# Patient Record
Sex: Female | Born: 1935 | Race: White | Hispanic: No | Smoking: Never smoker
Health system: Southern US, Community
[De-identification: ages and names within clinical notes are randomized; demographics above are authoritative.]

## PROBLEM LIST (undated history)

## (undated) DIAGNOSIS — F0390 Unspecified dementia without behavioral disturbance: Secondary | ICD-10-CM

## (undated) DIAGNOSIS — I2510 Atherosclerotic heart disease of native coronary artery without angina pectoris: Secondary | ICD-10-CM

## (undated) DIAGNOSIS — F0280 Dementia in other diseases classified elsewhere without behavioral disturbance: Secondary | ICD-10-CM

## (undated) DIAGNOSIS — G309 Alzheimer's disease, unspecified: Secondary | ICD-10-CM

## (undated) DIAGNOSIS — K922 Gastrointestinal hemorrhage, unspecified: Secondary | ICD-10-CM

## (undated) DIAGNOSIS — R911 Solitary pulmonary nodule: Secondary | ICD-10-CM

## (undated) DIAGNOSIS — I639 Cerebral infarction, unspecified: Secondary | ICD-10-CM

## (undated) DIAGNOSIS — I10 Essential (primary) hypertension: Secondary | ICD-10-CM

## (undated) DIAGNOSIS — K449 Diaphragmatic hernia without obstruction or gangrene: Secondary | ICD-10-CM

## (undated) DIAGNOSIS — R0602 Shortness of breath: Secondary | ICD-10-CM

## (undated) DIAGNOSIS — M1990 Unspecified osteoarthritis, unspecified site: Secondary | ICD-10-CM

## (undated) DIAGNOSIS — Z87442 Personal history of urinary calculi: Secondary | ICD-10-CM

## (undated) DIAGNOSIS — M47812 Spondylosis without myelopathy or radiculopathy, cervical region: Secondary | ICD-10-CM

## (undated) DIAGNOSIS — D72829 Elevated white blood cell count, unspecified: Secondary | ICD-10-CM

## (undated) DIAGNOSIS — D72819 Decreased white blood cell count, unspecified: Secondary | ICD-10-CM

## (undated) DIAGNOSIS — K219 Gastro-esophageal reflux disease without esophagitis: Secondary | ICD-10-CM

## (undated) DIAGNOSIS — H34231 Retinal artery branch occlusion, right eye: Secondary | ICD-10-CM

## (undated) DIAGNOSIS — K5790 Diverticulosis of intestine, part unspecified, without perforation or abscess without bleeding: Secondary | ICD-10-CM

## (undated) DIAGNOSIS — E785 Hyperlipidemia, unspecified: Secondary | ICD-10-CM

## (undated) HISTORY — DX: Spondylosis without myelopathy or radiculopathy, cervical region: M47.812

## (undated) HISTORY — PX: TOTAL ABDOMINAL HYSTERECTOMY: SHX209

## (undated) HISTORY — DX: Solitary pulmonary nodule: R91.1

## (undated) HISTORY — PX: JOINT REPLACEMENT: SHX530

## (undated) HISTORY — DX: Diaphragmatic hernia without obstruction or gangrene: K44.9

## (undated) HISTORY — DX: Retinal artery branch occlusion, right eye: H34.231

## (undated) HISTORY — DX: Atherosclerotic heart disease of native coronary artery without angina pectoris: I25.10

## (undated) HISTORY — DX: Decreased white blood cell count, unspecified: D72.819

## (undated) HISTORY — PX: CERVICAL DISCECTOMY: SHX98

## (undated) HISTORY — DX: Gastro-esophageal reflux disease without esophagitis: K21.9

## (undated) HISTORY — PX: CHOLECYSTECTOMY: SHX55

## (undated) HISTORY — DX: Hyperlipidemia, unspecified: E78.5

## (undated) HISTORY — PX: VASCULAR SURGERY: SHX849

## (undated) HISTORY — DX: Unspecified dementia, unspecified severity, without behavioral disturbance, psychotic disturbance, mood disturbance, and anxiety: F03.90

## (undated) HISTORY — DX: Essential (primary) hypertension: I10

## (undated) HISTORY — DX: Gastrointestinal hemorrhage, unspecified: K92.2

## (undated) HISTORY — DX: Unspecified osteoarthritis, unspecified site: M19.90

---

## 1999-04-06 ENCOUNTER — Encounter: Payer: Self-pay | Admitting: Neurosurgery

## 1999-04-08 ENCOUNTER — Inpatient Hospital Stay (HOSPITAL_COMMUNITY): Admission: RE | Admit: 1999-04-08 | Discharge: 1999-04-09 | Payer: Self-pay | Admitting: Neurosurgery

## 1999-10-29 ENCOUNTER — Encounter: Payer: Self-pay | Admitting: Neurosurgery

## 1999-10-29 ENCOUNTER — Encounter: Admission: RE | Admit: 1999-10-29 | Discharge: 1999-10-29 | Payer: Self-pay | Admitting: Neurosurgery

## 1999-12-01 ENCOUNTER — Ambulatory Visit (HOSPITAL_COMMUNITY): Admission: RE | Admit: 1999-12-01 | Discharge: 1999-12-01 | Payer: Self-pay | Admitting: Neurosurgery

## 1999-12-01 ENCOUNTER — Encounter: Payer: Self-pay | Admitting: Neurosurgery

## 2000-02-18 ENCOUNTER — Encounter: Admission: RE | Admit: 2000-02-18 | Discharge: 2000-02-18 | Payer: Self-pay | Admitting: Neurosurgery

## 2000-02-18 ENCOUNTER — Encounter: Payer: Self-pay | Admitting: Neurosurgery

## 2000-07-04 ENCOUNTER — Encounter: Admission: RE | Admit: 2000-07-04 | Discharge: 2000-07-04 | Payer: Self-pay | Admitting: Neurosurgery

## 2000-07-04 ENCOUNTER — Encounter: Payer: Self-pay | Admitting: Neurosurgery

## 2000-07-10 ENCOUNTER — Encounter: Payer: Self-pay | Admitting: Neurosurgery

## 2000-07-12 ENCOUNTER — Ambulatory Visit (HOSPITAL_COMMUNITY): Admission: RE | Admit: 2000-07-12 | Discharge: 2000-07-12 | Payer: Self-pay | Admitting: Neurosurgery

## 2001-01-01 ENCOUNTER — Encounter: Admission: RE | Admit: 2001-01-01 | Discharge: 2001-01-01 | Payer: Self-pay | Admitting: Orthopedic Surgery

## 2001-01-01 ENCOUNTER — Encounter: Payer: Self-pay | Admitting: Orthopedic Surgery

## 2001-01-26 ENCOUNTER — Encounter: Payer: Self-pay | Admitting: Orthopedic Surgery

## 2001-01-26 ENCOUNTER — Encounter: Admission: RE | Admit: 2001-01-26 | Discharge: 2001-01-26 | Payer: Self-pay | Admitting: Orthopedic Surgery

## 2001-01-29 ENCOUNTER — Ambulatory Visit (HOSPITAL_BASED_OUTPATIENT_CLINIC_OR_DEPARTMENT_OTHER): Admission: RE | Admit: 2001-01-29 | Discharge: 2001-01-29 | Payer: Self-pay | Admitting: Orthopedic Surgery

## 2001-05-03 ENCOUNTER — Encounter (HOSPITAL_COMMUNITY): Admission: RE | Admit: 2001-05-03 | Discharge: 2001-06-02 | Payer: Self-pay | Admitting: Orthopedic Surgery

## 2001-06-05 ENCOUNTER — Encounter (HOSPITAL_COMMUNITY): Admission: RE | Admit: 2001-06-05 | Discharge: 2001-07-05 | Payer: Self-pay | Admitting: Orthopedic Surgery

## 2001-07-06 ENCOUNTER — Encounter (HOSPITAL_COMMUNITY): Admission: RE | Admit: 2001-07-06 | Discharge: 2001-08-05 | Payer: Self-pay | Admitting: Orthopedic Surgery

## 2001-07-31 ENCOUNTER — Encounter: Payer: Self-pay | Admitting: Family Medicine

## 2001-07-31 ENCOUNTER — Ambulatory Visit (HOSPITAL_COMMUNITY): Admission: RE | Admit: 2001-07-31 | Discharge: 2001-07-31 | Payer: Self-pay | Admitting: Family Medicine

## 2001-11-21 DIAGNOSIS — I2510 Atherosclerotic heart disease of native coronary artery without angina pectoris: Secondary | ICD-10-CM

## 2001-11-21 HISTORY — DX: Atherosclerotic heart disease of native coronary artery without angina pectoris: I25.10

## 2002-03-25 ENCOUNTER — Encounter: Payer: Self-pay | Admitting: Family Medicine

## 2002-03-25 ENCOUNTER — Ambulatory Visit (HOSPITAL_COMMUNITY): Admission: RE | Admit: 2002-03-25 | Discharge: 2002-03-25 | Payer: Self-pay | Admitting: Family Medicine

## 2002-04-25 ENCOUNTER — Encounter: Payer: Self-pay | Admitting: Family Medicine

## 2002-04-25 ENCOUNTER — Ambulatory Visit (HOSPITAL_COMMUNITY): Admission: RE | Admit: 2002-04-25 | Discharge: 2002-04-25 | Payer: Self-pay | Admitting: Family Medicine

## 2002-07-25 ENCOUNTER — Encounter: Payer: Self-pay | Admitting: Family Medicine

## 2002-07-25 ENCOUNTER — Ambulatory Visit (HOSPITAL_COMMUNITY): Admission: RE | Admit: 2002-07-25 | Discharge: 2002-07-25 | Payer: Self-pay | Admitting: Family Medicine

## 2002-07-30 ENCOUNTER — Ambulatory Visit (HOSPITAL_COMMUNITY): Admission: RE | Admit: 2002-07-30 | Discharge: 2002-07-30 | Payer: Self-pay | Admitting: Cardiology

## 2002-08-02 ENCOUNTER — Ambulatory Visit (HOSPITAL_COMMUNITY): Admission: RE | Admit: 2002-08-02 | Discharge: 2002-08-02 | Payer: Self-pay | Admitting: Cardiology

## 2002-08-04 ENCOUNTER — Emergency Department (HOSPITAL_COMMUNITY): Admission: EM | Admit: 2002-08-04 | Discharge: 2002-08-04 | Payer: Self-pay | Admitting: Emergency Medicine

## 2002-08-07 ENCOUNTER — Ambulatory Visit (HOSPITAL_COMMUNITY): Admission: RE | Admit: 2002-08-07 | Discharge: 2002-08-07 | Payer: Self-pay | Admitting: Cardiology

## 2002-08-08 ENCOUNTER — Ambulatory Visit (HOSPITAL_COMMUNITY): Admission: RE | Admit: 2002-08-08 | Discharge: 2002-08-08 | Payer: Self-pay | Admitting: Cardiology

## 2002-08-22 ENCOUNTER — Ambulatory Visit (HOSPITAL_COMMUNITY): Admission: RE | Admit: 2002-08-22 | Discharge: 2002-08-22 | Payer: Self-pay | Admitting: Family Medicine

## 2002-08-22 ENCOUNTER — Encounter: Payer: Self-pay | Admitting: Family Medicine

## 2002-08-27 ENCOUNTER — Ambulatory Visit (HOSPITAL_COMMUNITY): Admission: RE | Admit: 2002-08-27 | Discharge: 2002-08-27 | Payer: Self-pay | Admitting: Family Medicine

## 2002-08-27 ENCOUNTER — Encounter: Payer: Self-pay | Admitting: Family Medicine

## 2002-09-02 ENCOUNTER — Inpatient Hospital Stay (HOSPITAL_COMMUNITY): Admission: AD | Admit: 2002-09-02 | Discharge: 2002-09-05 | Payer: Self-pay | Admitting: Family Medicine

## 2002-09-02 ENCOUNTER — Encounter: Payer: Self-pay | Admitting: Family Medicine

## 2002-10-21 ENCOUNTER — Ambulatory Visit (HOSPITAL_COMMUNITY): Admission: RE | Admit: 2002-10-21 | Discharge: 2002-10-21 | Payer: Self-pay | Admitting: Internal Medicine

## 2002-10-21 ENCOUNTER — Encounter (INDEPENDENT_AMBULATORY_CARE_PROVIDER_SITE_OTHER): Payer: Self-pay | Admitting: *Deleted

## 2002-12-29 ENCOUNTER — Emergency Department (HOSPITAL_COMMUNITY): Admission: EM | Admit: 2002-12-29 | Discharge: 2002-12-29 | Payer: Self-pay

## 2003-02-26 ENCOUNTER — Encounter: Payer: Self-pay | Admitting: Urology

## 2003-02-26 ENCOUNTER — Ambulatory Visit (HOSPITAL_COMMUNITY): Admission: RE | Admit: 2003-02-26 | Discharge: 2003-02-26 | Payer: Self-pay | Admitting: Urology

## 2003-02-26 ENCOUNTER — Encounter (INDEPENDENT_AMBULATORY_CARE_PROVIDER_SITE_OTHER): Payer: Self-pay | Admitting: Specialist

## 2003-03-27 ENCOUNTER — Encounter: Payer: Self-pay | Admitting: Urology

## 2003-03-27 ENCOUNTER — Ambulatory Visit (HOSPITAL_BASED_OUTPATIENT_CLINIC_OR_DEPARTMENT_OTHER): Admission: RE | Admit: 2003-03-27 | Discharge: 2003-03-27 | Payer: Self-pay | Admitting: Urology

## 2003-08-04 ENCOUNTER — Encounter (HOSPITAL_COMMUNITY): Admission: RE | Admit: 2003-08-04 | Discharge: 2003-08-21 | Payer: Self-pay | Admitting: Oncology

## 2003-08-04 ENCOUNTER — Encounter: Admission: RE | Admit: 2003-08-04 | Discharge: 2003-08-04 | Payer: Self-pay | Admitting: Oncology

## 2003-11-12 ENCOUNTER — Encounter (HOSPITAL_COMMUNITY): Admission: RE | Admit: 2003-11-12 | Discharge: 2003-12-12 | Payer: Self-pay | Admitting: Oncology

## 2003-11-12 ENCOUNTER — Encounter: Admission: RE | Admit: 2003-11-12 | Discharge: 2003-11-12 | Payer: Self-pay | Admitting: Oncology

## 2004-02-04 ENCOUNTER — Encounter: Admission: RE | Admit: 2004-02-04 | Discharge: 2004-02-04 | Payer: Self-pay | Admitting: Oncology

## 2004-02-04 ENCOUNTER — Encounter (HOSPITAL_COMMUNITY): Admission: RE | Admit: 2004-02-04 | Discharge: 2004-03-05 | Payer: Self-pay | Admitting: Oncology

## 2004-03-11 ENCOUNTER — Encounter: Admission: RE | Admit: 2004-03-11 | Discharge: 2004-03-11 | Payer: Self-pay | Admitting: Oncology

## 2004-03-11 ENCOUNTER — Encounter (HOSPITAL_COMMUNITY): Admission: RE | Admit: 2004-03-11 | Discharge: 2004-04-10 | Payer: Self-pay | Admitting: Oncology

## 2004-03-19 ENCOUNTER — Ambulatory Visit (HOSPITAL_COMMUNITY): Admission: RE | Admit: 2004-03-19 | Discharge: 2004-03-19 | Payer: Self-pay | Admitting: Neurology

## 2004-04-09 ENCOUNTER — Ambulatory Visit (HOSPITAL_COMMUNITY): Admission: RE | Admit: 2004-04-09 | Discharge: 2004-04-09 | Payer: Self-pay | Admitting: Family Medicine

## 2004-04-27 ENCOUNTER — Encounter (HOSPITAL_COMMUNITY): Admission: RE | Admit: 2004-04-27 | Discharge: 2004-05-27 | Payer: Self-pay | Admitting: Oncology

## 2004-04-27 ENCOUNTER — Encounter: Admission: RE | Admit: 2004-04-27 | Discharge: 2004-04-27 | Payer: Self-pay | Admitting: Oncology

## 2004-06-07 ENCOUNTER — Encounter (HOSPITAL_COMMUNITY): Admission: RE | Admit: 2004-06-07 | Discharge: 2004-07-07 | Payer: Self-pay | Admitting: Oncology

## 2004-06-07 ENCOUNTER — Encounter: Admission: RE | Admit: 2004-06-07 | Discharge: 2004-06-07 | Payer: Self-pay | Admitting: Oncology

## 2004-07-19 ENCOUNTER — Encounter: Admission: RE | Admit: 2004-07-19 | Discharge: 2004-07-19 | Payer: Self-pay | Admitting: Oncology

## 2004-07-19 ENCOUNTER — Encounter (HOSPITAL_COMMUNITY): Admission: RE | Admit: 2004-07-19 | Discharge: 2004-08-18 | Payer: Self-pay | Admitting: Oncology

## 2004-09-13 ENCOUNTER — Encounter: Admission: RE | Admit: 2004-09-13 | Discharge: 2004-09-13 | Payer: Self-pay | Admitting: Oncology

## 2004-09-13 ENCOUNTER — Encounter (HOSPITAL_COMMUNITY): Admission: RE | Admit: 2004-09-13 | Discharge: 2004-10-13 | Payer: Self-pay | Admitting: Oncology

## 2004-10-11 ENCOUNTER — Ambulatory Visit (HOSPITAL_COMMUNITY): Payer: Self-pay | Admitting: Oncology

## 2004-11-08 ENCOUNTER — Encounter (HOSPITAL_COMMUNITY): Admission: RE | Admit: 2004-11-08 | Discharge: 2004-12-08 | Payer: Self-pay | Admitting: Oncology

## 2004-11-08 ENCOUNTER — Encounter: Admission: RE | Admit: 2004-11-08 | Discharge: 2004-11-08 | Payer: Self-pay | Admitting: Oncology

## 2004-11-21 HISTORY — PX: TOTAL KNEE ARTHROPLASTY: SHX125

## 2005-01-05 ENCOUNTER — Encounter (HOSPITAL_COMMUNITY): Admission: RE | Admit: 2005-01-05 | Discharge: 2005-02-04 | Payer: Self-pay | Admitting: Oncology

## 2005-01-05 ENCOUNTER — Encounter: Admission: RE | Admit: 2005-01-05 | Discharge: 2005-01-05 | Payer: Self-pay | Admitting: Oncology

## 2005-01-05 ENCOUNTER — Ambulatory Visit (HOSPITAL_COMMUNITY): Payer: Self-pay | Admitting: Oncology

## 2005-01-31 ENCOUNTER — Encounter (HOSPITAL_COMMUNITY): Admission: RE | Admit: 2005-01-31 | Discharge: 2005-03-02 | Payer: Self-pay | Admitting: Family Medicine

## 2005-02-14 ENCOUNTER — Ambulatory Visit: Payer: Self-pay | Admitting: Orthopedic Surgery

## 2005-02-14 ENCOUNTER — Inpatient Hospital Stay (HOSPITAL_COMMUNITY): Admission: RE | Admit: 2005-02-14 | Discharge: 2005-02-24 | Payer: Self-pay | Admitting: Orthopedic Surgery

## 2005-02-14 ENCOUNTER — Ambulatory Visit: Payer: Self-pay | Admitting: Physical Medicine & Rehabilitation

## 2005-02-18 ENCOUNTER — Ambulatory Visit: Payer: Self-pay | Admitting: Cardiology

## 2005-02-18 ENCOUNTER — Encounter: Payer: Self-pay | Admitting: Cardiology

## 2005-02-19 ENCOUNTER — Encounter (INDEPENDENT_AMBULATORY_CARE_PROVIDER_SITE_OTHER): Payer: Self-pay | Admitting: Specialist

## 2005-02-24 ENCOUNTER — Inpatient Hospital Stay
Admission: RE | Admit: 2005-02-24 | Discharge: 2005-03-02 | Payer: Self-pay | Admitting: Physical Medicine & Rehabilitation

## 2005-03-16 ENCOUNTER — Encounter (HOSPITAL_COMMUNITY): Admission: RE | Admit: 2005-03-16 | Discharge: 2005-04-15 | Payer: Self-pay | Admitting: Oncology

## 2005-03-16 ENCOUNTER — Encounter: Admission: RE | Admit: 2005-03-16 | Discharge: 2005-03-16 | Payer: Self-pay | Admitting: Oncology

## 2005-03-16 ENCOUNTER — Ambulatory Visit (HOSPITAL_COMMUNITY): Payer: Self-pay | Admitting: Oncology

## 2005-03-16 ENCOUNTER — Encounter (HOSPITAL_COMMUNITY): Admission: RE | Admit: 2005-03-16 | Discharge: 2005-04-15 | Payer: Self-pay | Admitting: Orthopedic Surgery

## 2005-03-18 ENCOUNTER — Ambulatory Visit: Payer: Self-pay | Admitting: Cardiology

## 2005-03-29 ENCOUNTER — Ambulatory Visit (HOSPITAL_COMMUNITY): Admission: RE | Admit: 2005-03-29 | Discharge: 2005-03-29 | Payer: Self-pay | Admitting: Family Medicine

## 2005-04-05 ENCOUNTER — Ambulatory Visit: Payer: Self-pay | Admitting: Internal Medicine

## 2005-04-11 ENCOUNTER — Ambulatory Visit: Payer: Self-pay | Admitting: Internal Medicine

## 2005-04-20 ENCOUNTER — Encounter (HOSPITAL_COMMUNITY): Admission: RE | Admit: 2005-04-20 | Discharge: 2005-05-20 | Payer: Self-pay | Admitting: Orthopedic Surgery

## 2005-05-13 ENCOUNTER — Ambulatory Visit (HOSPITAL_COMMUNITY): Payer: Self-pay | Admitting: Oncology

## 2005-05-13 ENCOUNTER — Encounter (HOSPITAL_COMMUNITY): Admission: RE | Admit: 2005-05-13 | Discharge: 2005-06-12 | Payer: Self-pay | Admitting: Oncology

## 2005-05-13 ENCOUNTER — Encounter: Admission: RE | Admit: 2005-05-13 | Discharge: 2005-05-13 | Payer: Self-pay | Admitting: Oncology

## 2005-08-01 ENCOUNTER — Ambulatory Visit (HOSPITAL_COMMUNITY): Admission: RE | Admit: 2005-08-01 | Discharge: 2005-08-01 | Payer: Self-pay | Admitting: Family Medicine

## 2005-09-07 ENCOUNTER — Encounter (HOSPITAL_COMMUNITY): Admission: RE | Admit: 2005-09-07 | Discharge: 2005-10-07 | Payer: Self-pay | Admitting: Oncology

## 2005-09-07 ENCOUNTER — Ambulatory Visit (HOSPITAL_COMMUNITY): Payer: Self-pay | Admitting: Oncology

## 2005-09-07 ENCOUNTER — Encounter: Admission: RE | Admit: 2005-09-07 | Discharge: 2005-09-07 | Payer: Self-pay | Admitting: Oncology

## 2006-03-08 ENCOUNTER — Ambulatory Visit (HOSPITAL_COMMUNITY): Payer: Self-pay | Admitting: Oncology

## 2006-03-08 ENCOUNTER — Encounter (HOSPITAL_COMMUNITY): Admission: RE | Admit: 2006-03-08 | Discharge: 2006-04-07 | Payer: Self-pay | Admitting: Oncology

## 2006-03-08 ENCOUNTER — Encounter: Admission: RE | Admit: 2006-03-08 | Discharge: 2006-03-08 | Payer: Self-pay | Admitting: Oncology

## 2006-03-16 ENCOUNTER — Ambulatory Visit: Payer: Self-pay | Admitting: Cardiology

## 2006-03-22 ENCOUNTER — Encounter: Payer: Self-pay | Admitting: Neurosurgery

## 2006-04-05 ENCOUNTER — Ambulatory Visit: Payer: Self-pay | Admitting: Internal Medicine

## 2006-09-11 ENCOUNTER — Encounter (HOSPITAL_COMMUNITY): Admission: RE | Admit: 2006-09-11 | Discharge: 2006-10-11 | Payer: Self-pay | Admitting: Oncology

## 2006-09-11 ENCOUNTER — Ambulatory Visit (HOSPITAL_COMMUNITY): Payer: Self-pay | Admitting: Oncology

## 2006-09-11 ENCOUNTER — Encounter: Admission: RE | Admit: 2006-09-11 | Discharge: 2006-09-11 | Payer: Self-pay | Admitting: Oncology

## 2007-03-06 ENCOUNTER — Encounter (HOSPITAL_COMMUNITY): Admission: RE | Admit: 2007-03-06 | Discharge: 2007-04-05 | Payer: Self-pay | Admitting: Oncology

## 2007-03-06 ENCOUNTER — Ambulatory Visit (HOSPITAL_COMMUNITY): Payer: Self-pay | Admitting: Oncology

## 2007-03-14 ENCOUNTER — Ambulatory Visit: Payer: Self-pay | Admitting: Cardiology

## 2007-04-07 ENCOUNTER — Emergency Department (HOSPITAL_COMMUNITY): Admission: EM | Admit: 2007-04-07 | Discharge: 2007-04-07 | Payer: Self-pay | Admitting: Emergency Medicine

## 2007-04-09 ENCOUNTER — Observation Stay (HOSPITAL_COMMUNITY): Admission: AD | Admit: 2007-04-09 | Discharge: 2007-04-10 | Payer: Self-pay | Admitting: Family Medicine

## 2007-06-26 ENCOUNTER — Emergency Department (HOSPITAL_COMMUNITY): Admission: EM | Admit: 2007-06-26 | Discharge: 2007-06-26 | Payer: Self-pay | Admitting: *Deleted

## 2007-09-17 ENCOUNTER — Ambulatory Visit: Payer: Self-pay | Admitting: Internal Medicine

## 2007-10-08 ENCOUNTER — Ambulatory Visit (HOSPITAL_COMMUNITY): Admission: RE | Admit: 2007-10-08 | Discharge: 2007-10-08 | Payer: Self-pay | Admitting: Family Medicine

## 2007-11-22 HISTORY — PX: COLONOSCOPY: SHX174

## 2008-01-16 DIAGNOSIS — G20 Parkinson's disease: Secondary | ICD-10-CM

## 2008-01-16 DIAGNOSIS — N200 Calculus of kidney: Secondary | ICD-10-CM | POA: Insufficient documentation

## 2008-01-16 DIAGNOSIS — E785 Hyperlipidemia, unspecified: Secondary | ICD-10-CM | POA: Insufficient documentation

## 2008-01-16 DIAGNOSIS — I10 Essential (primary) hypertension: Secondary | ICD-10-CM | POA: Insufficient documentation

## 2008-09-17 ENCOUNTER — Telehealth: Payer: Self-pay | Admitting: Internal Medicine

## 2008-09-22 ENCOUNTER — Encounter: Payer: Self-pay | Admitting: Internal Medicine

## 2008-09-23 ENCOUNTER — Telehealth: Payer: Self-pay | Admitting: Internal Medicine

## 2008-10-01 ENCOUNTER — Ambulatory Visit: Payer: Self-pay | Admitting: Internal Medicine

## 2008-10-02 ENCOUNTER — Encounter: Payer: Self-pay | Admitting: Internal Medicine

## 2008-10-02 ENCOUNTER — Ambulatory Visit: Payer: Self-pay | Admitting: Internal Medicine

## 2008-10-03 ENCOUNTER — Telehealth: Payer: Self-pay | Admitting: Internal Medicine

## 2008-10-06 ENCOUNTER — Encounter: Payer: Self-pay | Admitting: Internal Medicine

## 2008-10-07 ENCOUNTER — Telehealth: Payer: Self-pay | Admitting: Internal Medicine

## 2008-10-08 ENCOUNTER — Telehealth: Payer: Self-pay | Admitting: Internal Medicine

## 2008-10-14 ENCOUNTER — Telehealth: Payer: Self-pay | Admitting: Internal Medicine

## 2008-10-27 ENCOUNTER — Ambulatory Visit (HOSPITAL_COMMUNITY): Admission: RE | Admit: 2008-10-27 | Discharge: 2008-10-27 | Payer: Self-pay | Admitting: Family Medicine

## 2008-11-06 ENCOUNTER — Telehealth: Payer: Self-pay | Admitting: Internal Medicine

## 2008-11-20 ENCOUNTER — Encounter (HOSPITAL_COMMUNITY): Admission: RE | Admit: 2008-11-20 | Discharge: 2008-12-20 | Payer: Self-pay | Admitting: Family Medicine

## 2008-11-20 ENCOUNTER — Ambulatory Visit (HOSPITAL_COMMUNITY): Payer: Self-pay | Admitting: Family Medicine

## 2008-12-18 ENCOUNTER — Telehealth: Payer: Self-pay | Admitting: Internal Medicine

## 2009-02-12 ENCOUNTER — Ambulatory Visit (HOSPITAL_COMMUNITY): Payer: Self-pay | Admitting: Family Medicine

## 2009-02-12 ENCOUNTER — Encounter (HOSPITAL_COMMUNITY): Admission: RE | Admit: 2009-02-12 | Discharge: 2009-03-14 | Payer: Self-pay | Admitting: Family Medicine

## 2009-03-10 ENCOUNTER — Ambulatory Visit: Payer: Self-pay | Admitting: Cardiology

## 2009-05-13 ENCOUNTER — Encounter (HOSPITAL_COMMUNITY): Admission: RE | Admit: 2009-05-13 | Discharge: 2009-06-12 | Payer: Self-pay | Admitting: Family Medicine

## 2009-05-13 ENCOUNTER — Ambulatory Visit (HOSPITAL_COMMUNITY): Payer: Self-pay | Admitting: Family Medicine

## 2009-06-08 ENCOUNTER — Encounter (HOSPITAL_COMMUNITY): Admission: RE | Admit: 2009-06-08 | Discharge: 2009-07-08 | Payer: Self-pay | Admitting: Family Medicine

## 2009-08-12 ENCOUNTER — Ambulatory Visit (HOSPITAL_COMMUNITY): Payer: Self-pay | Admitting: Family Medicine

## 2009-08-12 ENCOUNTER — Encounter (HOSPITAL_COMMUNITY): Admission: RE | Admit: 2009-08-12 | Discharge: 2009-08-19 | Payer: Self-pay | Admitting: Family Medicine

## 2009-09-08 ENCOUNTER — Ambulatory Visit (HOSPITAL_COMMUNITY): Admission: RE | Admit: 2009-09-08 | Discharge: 2009-09-08 | Payer: Self-pay | Admitting: Family Medicine

## 2009-11-11 ENCOUNTER — Encounter (HOSPITAL_COMMUNITY): Admission: RE | Admit: 2009-11-11 | Discharge: 2009-11-17 | Payer: Self-pay | Admitting: Family Medicine

## 2009-11-11 ENCOUNTER — Ambulatory Visit (HOSPITAL_COMMUNITY): Payer: Self-pay | Admitting: Family Medicine

## 2009-11-26 ENCOUNTER — Ambulatory Visit (HOSPITAL_COMMUNITY): Admission: RE | Admit: 2009-11-26 | Discharge: 2009-11-26 | Payer: Self-pay | Admitting: Family Medicine

## 2009-12-28 ENCOUNTER — Encounter: Payer: Self-pay | Admitting: Internal Medicine

## 2010-01-15 ENCOUNTER — Encounter: Payer: Self-pay | Admitting: Internal Medicine

## 2010-02-02 ENCOUNTER — Encounter (INDEPENDENT_AMBULATORY_CARE_PROVIDER_SITE_OTHER): Payer: Self-pay | Admitting: *Deleted

## 2010-02-02 ENCOUNTER — Encounter: Payer: Self-pay | Admitting: Internal Medicine

## 2010-02-02 LAB — CONVERTED CEMR LAB
ALT: 20 units/L
AST: 17 units/L
Albumin: 4.5 g/dL
BUN: 20 mg/dL
CO2: 22 meq/L
Calcium: 9.1 mg/dL
Chloride: 109 meq/L
HDL: 45 mg/dL
Hemoglobin: 12.7 g/dL
Hgb A1c MFr Bld: 6.5 %
LDL Cholesterol: 28 mg/dL
MCV: 103.8 fL
Potassium: 4.3 meq/L
WBC: 4.4 10*3/uL

## 2010-02-06 ENCOUNTER — Emergency Department (HOSPITAL_COMMUNITY): Admission: EM | Admit: 2010-02-06 | Discharge: 2010-02-06 | Payer: Self-pay | Admitting: Emergency Medicine

## 2010-02-17 ENCOUNTER — Encounter (HOSPITAL_COMMUNITY): Admission: RE | Admit: 2010-02-17 | Discharge: 2010-03-19 | Payer: Self-pay | Admitting: Family Medicine

## 2010-02-17 ENCOUNTER — Ambulatory Visit (HOSPITAL_COMMUNITY): Payer: Self-pay | Admitting: Family Medicine

## 2010-02-26 ENCOUNTER — Ambulatory Visit (HOSPITAL_COMMUNITY): Admission: RE | Admit: 2010-02-26 | Discharge: 2010-02-26 | Payer: Self-pay | Admitting: Family Medicine

## 2010-03-11 ENCOUNTER — Ambulatory Visit (HOSPITAL_COMMUNITY)
Admission: RE | Admit: 2010-03-11 | Discharge: 2010-03-11 | Payer: Self-pay | Source: Home / Self Care | Admitting: Family Medicine

## 2010-04-20 ENCOUNTER — Encounter (INDEPENDENT_AMBULATORY_CARE_PROVIDER_SITE_OTHER): Payer: Self-pay | Admitting: *Deleted

## 2010-04-27 ENCOUNTER — Ambulatory Visit: Payer: Self-pay | Admitting: Cardiology

## 2010-05-20 ENCOUNTER — Encounter (HOSPITAL_COMMUNITY): Admission: RE | Admit: 2010-05-20 | Discharge: 2010-06-19 | Payer: Self-pay | Admitting: Family Medicine

## 2010-05-20 ENCOUNTER — Ambulatory Visit (HOSPITAL_COMMUNITY): Payer: Self-pay | Admitting: Family Medicine

## 2010-08-19 ENCOUNTER — Encounter (HOSPITAL_COMMUNITY)
Admission: RE | Admit: 2010-08-19 | Discharge: 2010-08-20 | Payer: Self-pay | Source: Home / Self Care | Admitting: Family Medicine

## 2010-08-19 ENCOUNTER — Ambulatory Visit (HOSPITAL_COMMUNITY): Payer: Self-pay | Admitting: Family Medicine

## 2010-11-25 ENCOUNTER — Ambulatory Visit (HOSPITAL_COMMUNITY)
Admission: RE | Admit: 2010-11-25 | Discharge: 2010-12-21 | Payer: Self-pay | Source: Home / Self Care | Attending: Family Medicine | Admitting: Family Medicine

## 2010-11-25 ENCOUNTER — Encounter (HOSPITAL_COMMUNITY)
Admission: RE | Admit: 2010-11-25 | Discharge: 2010-12-21 | Payer: Self-pay | Source: Home / Self Care | Attending: Family Medicine | Admitting: Family Medicine

## 2010-12-12 ENCOUNTER — Encounter: Payer: Self-pay | Admitting: Family Medicine

## 2010-12-20 ENCOUNTER — Other Ambulatory Visit (HOSPITAL_COMMUNITY): Payer: Self-pay | Admitting: Family Medicine

## 2010-12-20 DIAGNOSIS — Z139 Encounter for screening, unspecified: Secondary | ICD-10-CM

## 2010-12-23 ENCOUNTER — Ambulatory Visit (HOSPITAL_COMMUNITY)
Admission: RE | Admit: 2010-12-23 | Discharge: 2010-12-23 | Disposition: A | Discharge status: Home / Self Care | Payer: Medicare Other | Source: Ambulatory Visit | Attending: Family Medicine | Admitting: Family Medicine

## 2010-12-23 DIAGNOSIS — Z1231 Encounter for screening mammogram for malignant neoplasm of breast: Secondary | ICD-10-CM | POA: Insufficient documentation

## 2010-12-23 DIAGNOSIS — Z139 Encounter for screening, unspecified: Secondary | ICD-10-CM

## 2010-12-23 NOTE — Assessment & Plan Note (Signed)
Summary: 1 yr f/u per checkout on 03/10/09/tg  Medications Added POTASSIUM CHLORIDE 10 MEQ/100ML SOLN (POTASSIUM CHLORIDE) 1 tablespoon at night LISINOPRIL 10 MG TABS (LISINOPRIL) take 1/2 tab daily BONIVA 3 MG/3ML KIT (IBANDRONATE SODIUM) 1 shot q 3 months TYLENOL EXTRA STRENGTH 500 MG TABS (ACETAMINOPHEN) take 2 tabs two times a day COZAAR 100 MG TABS (LOSARTAN POTASSIUM) take 1 tab daily PRAVASTATIN SODIUM 10 MG TABS (PRAVASTATIN SODIUM) take 1 tab daily      Allergies Added:   Visit Type:  Follow-up Primary Provider:  Lilyan Punt, MD   History of Present Illness: [name] returns to the office for continued assessment and treatment of cardiovascular risk factors.  Since her last visit, she has done fairly well.  Control of hypertension and diabetes has been excellent, at least partially as a result of significant weight loss.  Patient notes some depression since the death of her husband, but denies anorexia or sleep disturbance.  She has no chest pain, dyspnea on exertion, orthopnea, or PND.  She has had some mild lightheadedness, but no loss of consciousness and no falls.  Current Medications (verified): 1)  Omeprazole 40 Mg Cpdr (Omeprazole) .Marland Kitchen.. 1 Tablet By Mouth Two Times A Day 2)  Cvs Stool Softener 100 Mg Caps (Docusate Sodium) .Marland Kitchen.. 1 Tablet By Mouth Two Times A Day 3)  Potassium Chloride 10 Meq/136ml Soln (Potassium Chloride) .Marland Kitchen.. 1 Tablespoon At Night 4)  Citalopram Hydrobromide 20 Mg Tabs (Citalopram Hydrobromide) .... 1/2 Tablet By Mouth Once Daily 5)  Namenda 5 Mg Tabs (Memantine Hcl) .Marland Kitchen.. 1 Tablet By Mouth Two Times A Day 6)  Lisinopril 10 Mg Tabs (Lisinopril) .... Take 1/2 Tab Daily 7)  Aspirin 81 Mg  Tabs (Aspirin) .Marland Kitchen.. 1 Tablet By Mouth Once Daily 8)  Vitamin E 600 Unit  Caps (Vitamin E) .Marland Kitchen.. 1 Tablet By Mouth Once Daily 9)  Vitamin B Complex-C   Caps (B Complex-C) .Marland Kitchen.. 1 Tablet By Mouth Once Daily 10)  Caltrate 600+d Plus 600-400 Mg-Unit Tabs (Calcium  Carbonate-Vit D-Min) .Marland Kitchen.. 1 Tablet By Mouth Once Daily 11)  Boniva 3 Mg/30ml Kit (Ibandronate Sodium) .Marland Kitchen.. 1 Shot Q 3 Months 12)  Tylenol Extra Strength 500 Mg Tabs (Acetaminophen) .... Take 2 Tabs Two Times A Day 13)  Cozaar 100 Mg Tabs (Losartan Potassium) .... Take 1 Tab Daily 14)  Pravastatin Sodium 10 Mg Tabs (Pravastatin Sodium) .... Take 1 Tab Daily  Allergies (verified): 1)  ! Codeine 2)  ! Morphine  Past History:  PMH, FH, and Social History reviewed and updated.   Review of Systems       See history of present illness.  Vital Signs:  Patient profile:   77 year old female Weight:      167 pounds BMI:     31.67 Pulse rate:   89 / minute Pulse (ortho):   94 / minute BP sitting:   131 / 68  (right arm) BP standing:   110 / 63  Vitals Entered By: Dreama Saa, CNA (April 27, 2010 2:27 PM)  Serial Vital Signs/Assessments:  Time      Position  BP       Pulse  Resp  Temp     By 3:23 PM   Lying RA  120/66   79                    Tammy Sanders RN 3:23 PM   Sitting   112/61   80  Teressa Lower RN 3:23 PM   Standing  110/63   94                    Tammy Sanders RN  Comments: 3:23 PM pt c/o lightheadedness on standing only By: Teressa Lower RN    Physical Exam  General:  Overweight; well developed; no acute distress: patient's daughter does most of the talking for her Weight-167, 22 pounds less then 18 months ago Blood Pressure-120/70 with 10 mm orthostatic change and no symptoms with standing Neck-No JVD; no carotid bruits: Lungs-No tachypnea, no rales; no rhonchi; no wheezes: Cardiovascular-normal PMI; distant S1 and S2; grade 2/6 basilar systolic ejection murmur Abdomen-BS normal; soft and non-tender without masses or organomegaly:  Musculoskeletal-No deformities, no cyanosis or clubbing: Neurologic-Normal cranial nerves; symmetric strength and tone:  Skin-Warm, no significant lesions: Extremities-Nl distal pulses; no  edema:     Impression & Recommendations:  Problem # 1:  ATHEROSCLEROTIC CARDIOVASCULAR DISEASE (ICD-429.2) Patient had borderline single-vessel disease at catheterization 8 years ago.  She has no current symptoms to suggest progression of disease to cause myocardial ischemia.  Our current approach of managing cardiovascular risk factors optimally will continue to be pursued.  Problem # 2:  HYPERLIPIDEMIA (ICD-272.4) Due to very low values for total and LDL cholesterol, patient's statin medication has been progressively reduced in dosage.  Although her lipid profile would probably be excellent off drugs, in light of the presence of known coronary artery disease and a history of diabetes, I would be inclined to continue her dose of pravastatin indefinitely  Problem # 3:  HYPERTENSION (ICD-401.9) Blood pressure control is good with borderline significant orthostatic change and a history of lightheadedness but no syncope.  She is taking both a very low dose of an ACE inhibitor plus an ARB at maximal dose.  Consideration could be given to discontinuing her ACE inhibitor.  Problem # 4:  Weight loss Although her current weight is more desirable than her previous weight, loss of more than 20 pounds without attempted caloric restriction is somewhat worrisome.  This certainly could be due to depression or simply to a change in lifestyle since the death of her husband.  In the absence of symptoms, diagnostic testing to seek a cause for weight loss will probably not be helpful.  Problem # 5:  DIABETES MELLITUS (ICD-250.00) [name] [name] is no longer taking any medication for treatment of diabetes, and has apparently maintained a normal hemoglobin A1c level.  Improvement in this condition probably resulted from her substantial weight loss.  I will plan to reassess this very nice woman in one year.  Patient Instructions: 1)  Your physician recommends that you schedule a follow-up appointment in: 1 year

## 2010-12-23 NOTE — Letter (Signed)
Summary: [facility]  [facility]   Imported By: Maryln Gottron 02/08/2010 15:50:48  _____________________________________________________________________  External Attachment:    Type:   Image     Comment:   External Document

## 2010-12-23 NOTE — Miscellaneous (Signed)
Summary: LABS CBCD,BMP,LIPIDS,LIVER,A1C,02/02/2010  Clinical Lists Changes  Observations: Added new observation of CALCIUM: 9.1 mg/dL (04/54/0981 19:14) Added new observation of ALBUMIN: 4.5 g/dL (78/29/5621 30:86) Added new observation of PROTEIN, TOT: 6.9 g/dL (57/84/6962 95:28) Added new observation of SGPT (ALT): 20 units/L (02/02/2010 13:55) Added new observation of SGOT (AST): 17 units/L (02/02/2010 13:55) Added new observation of ALK PHOS: 31 units/L (02/02/2010 13:55) Added new observation of BILI DIRECT: 0.1 mg/dL (41/32/4401 02:72) Added new observation of CREATININE: 1.05 mg/dL (53/66/4403 47:42) Added new observation of BUN: 20 mg/dL (59/56/3875 64:33) Added new observation of BG RANDOM: 126 mg/dL (29/51/8841 66:06) Added new observation of CO2 PLSM/SER: 22 meq/L (02/02/2010 13:55) Added new observation of CL SERUM: 109 meq/L (02/02/2010 13:55) Added new observation of K SERUM: 4.3 meq/L (02/02/2010 13:55) Added new observation of NA: 143 meq/L (02/02/2010 13:55) Added new observation of LDL: 28 mg/dL (30/16/0109 32:35) Added new observation of HDL: 45 mg/dL (57/32/2025 42:70) Added new observation of TRIGLYC TOT: 66 mg/dL (62/37/6283 15:17) Added new observation of CHOLESTEROL: 86 mg/dL (61/60/7371 06:26) Added new observation of PLATELETK/UL: 208 K/uL (02/02/2010 13:55) Added new observation of MCV: 103.8 fL (02/02/2010 13:55) Added new observation of HCT: 41.1 % (02/02/2010 13:55) Added new observation of HGB: 12.7 g/dL (94/85/4627 03:50) Added new observation of WBC COUNT: 4.4 10*3/microliter (02/02/2010 13:55) Added new observation of HGBA1C: 6.5 % (02/02/2010 13:55)

## 2010-12-23 NOTE — Letter (Signed)
Summary: [facility]  [facility]   Imported By: Maryln Gottron 02/08/2010 15:49:31  _____________________________________________________________________  External Attachment:    Type:   Image     Comment:   External Document

## 2011-02-24 ENCOUNTER — Encounter (HOSPITAL_COMMUNITY): Payer: Medicare Other | Attending: Family Medicine

## 2011-02-24 ENCOUNTER — Encounter (HOSPITAL_COMMUNITY): Payer: Medicare Other

## 2011-02-24 DIAGNOSIS — M818 Other osteoporosis without current pathological fracture: Secondary | ICD-10-CM | POA: Insufficient documentation

## 2011-04-05 NOTE — Letter (Signed)
March 10, 2009    Scott A. Gerda Diss, MD  [address], [address]  [place], [place] [address]   RE:  TRIANA, COOVER  MRN:  [id number]  /  DOB:  12-22-1923   Dear Lorin Picket,   Ms. Boschee returns to the office 1 year beyond her scheduled visit for  continued assessment and treatment of cardiovascular risk factors.  Since I last saw her 2 years ago, she has done quite well.  She leads a  sedentary lifestyle, but has no dyspnea nor chest discomfort.  She is  able to do light housework without difficulty.  She has developed no new  medical problems nor has she been hospitalized nor seen in the emergency  department.  Her arthritic discomfort is actually improved over the past  year or so.  Blood pressure control has been good.  Control of diabetes  has been good.  We reviewed prior records and obtained recent laboratory  reports, which were excellent, showing a normal CBC, normal chemistry  profile and an excellent lipid profile.   Current cardiac related medications include:  1. Aspirin 81 mg daily.  2. Lisinopril 10 mg daily.  3. Furosemide 20 mg daily.  4. KCl 20 mEq daily.  5. Pravastatin 20 mg daily.   PHYSICAL EXAMINATION:  GENERAL:  An overweight, pleasant woman in no  acute distress.  VITAL SIGNS:  The weight is 185, 3 pounds less than in October 2008.  Blood pressure 140/70, heart rate 65 and regular, respirations 12 and  unlabored.  NECK:  No jugular venous distention; normal carotid upstrokes without  bruits.  LUNGS:  Clear.  CARDIAC:  Distant first and second heart sounds; systolic ejection  murmur present.  ABDOMEN:  Soft and nontender; no masses; no organomegaly.  EXTREMITIES:  No edema; normal distal pulses.  SKIN:  Macular pigmented lesions along the cleavage line over the back.   IMPRESSION:  Ms. Milliner is doing quite well overall.  She is encouraged  to be more active.  Blood pressure control is good with her current  medications.  Consideration could be given to  substitute a low dose of a  thiazide diuretic for her low-dose furosemide, but I doubt that would  have a significant beneficial effect.  Control of lipids is excellent.  Despite single-vessel coronary artery disease at catheterization in  2003, she has had no clinical evidence for myocardial ischemia.  She  will continue her current medications.  I will leave followup laboratory  studies to your discretion and plan to see this nice woman again in 1  year.    Sincerely,      Gerrit Friends. Dietrich Pates, MD, [facility]  Electronically Signed    RMR/MedQ  DD: 03/10/2009  DT: 03/11/2009  Job #: [id number]

## 2011-04-05 NOTE — Assessment & Plan Note (Signed)
[name]                         GASTROENTEROLOGY OFFICE NOTE   [name] [name], [name] [name]                     MRN:          [id number]  DATE:09/17/2007                            DOB:          03-27-1924    HISTORY OF PRESENT ILLNESS:  [name] [name] is a very nice 77 year old white  female with history of upper GI bleed due to gastric ulcer in April 2006  and prior to that in September 2003.  The last bleed occurred after  total knee replacement.  Repeat endoscopy in May 2006 revealed complete  healing of the gastric ulcer which was benign.  He also had a  colonoscopy by Dr. Karilyn Cota in 2003.  The patient denies any specific GI  symptoms except for gastroesophageal reflux disease.  She has a very dry  mouth in the morning, except there is some regurgitation of the acid and  food at night.  She also has intermittent hoarseness.  The patient ran  out of Protonix and had to take Prilosec and Pepcid 40 mg which cause  inadequate acid separation.  Her symptoms are aggravated by Actonel 35  mg and by aspirin 81 mg daily.   MEDICATIONS:  1. Prilosec 20 mg p.o. daily.  2. Aricept 10 mg p.o. daily.  3. Avapro 150 mg p.o. daily.  4. Verapamil 240 mg p.o. daily.  5. Zoloft 50 mg p.o. daily.  6. Furosemide 200 mg p.o. daily.  7. Detrol LA 4 mg p.o. daily.  8. Vitamin E.  9. Aspirin.  10.Folate-iron 2-3 times a week.  11.Claritin and D complex .  12.Actonel 35 mg weekly.   PHYSICAL EXAMINATION:  VITAL SIGNS:  Blood pressure 140/72, pulse 110,  weight 188 pounds.  GENERAL:  The patient was alert but very slow in responding . She was in  no distress.  HEENT:  Sclerae are not icteric.  NECK:  Supple.  There was some tenderness in the lateral side of the  neck but no bruits.  LUNGS:  Clear to auscultation.  COR:  Quite precordium.  Normal S1. S2.  ABDOMEN:  Obese, soft, protuberant without notable tenderness.  At the  subcostal margin there was a well-healed  right upper quadrant post  cholecystectomy scar.  RECTAL:  Normal rectal tone with soft brown hemoccult negative stool.   IMPRESSION:  An 77 year old white female with symptoms of  gastroesophageal reflux disease with poor control.  Due to no  availability of Protonix, she has to take Prilosec and Pepcid as a  substitute which does not cover her reflux.  She has a history of two  upper GI bleeds.  She needs to be on adequate acid separation.   PLAN:  1. Protonix 40 mg daily prescribed through [facility].  She      may redo full colonoscopy.  Her last one in 2003 showed two polyps.      We do not have the pathology of it.  At this point, we are going to      wait and see how she does with her Protonix.  2. Continue iron supplement to 2-3 times per  week.  3. If she does not have Protonix available, I suggest that she double      up on the Prilosec 20 mg b.i.d.  Since most of her reflux occurs at      night, I advised that she take her PPIs at bedtime.     Hedwig Morton. Juanda Chance, MD  Electronically Signed    DMB/MedQ  DD: 09/17/2007  DT: 09/18/2007  Job #: [id number]   cc:   Lorin Picket A. Gerda Diss, MD

## 2011-04-05 NOTE — H&P (Signed)
[name], [name]              ACCOUNT NO.:  [id number]   MEDICAL RECORD NO.:  [id number]          PATIENT TYPE:  OBV   LOCATION:  A315                          FACILITY:  [facility]   PHYSICIAN:  Scott A. Gerda Diss, MD    DATE OF BIRTH:  04/20/1924   DATE OF ADMISSION:  04/09/2007  DATE OF DISCHARGE:  LH                              HISTORY & PHYSICAL   HISTORY OF PRESENT ILLNESS:  This is an 77 year old white female who  recently had been diagnosed with early pyelonephritis and was given a  shot of Rocephin on the 10th of May along with starting on Cipro.  Over  the past few days she has had some nausea and vomiting and has felt very  weak, and she feels dizzy when she stands up.  She has not been able to  eat or drink as much as normal, and her energy level has been subpar.  She went to the emergency department over the weekend because of neck  pain, and they diagnosed her with arthritis in the neck.  She denies any  chest pressure, shortness of breath or wheezing.  Denies swelling in the  legs, PMH, irritable bowel, HTN, diabetes, history of a gastric ulcer,  history of renal cyst, history of right central vein retinal occlusion,  history of hysterectomy, and gallbladder removal.   FAMILY HISTORY:  Hypertension, diabetes.   SOCIAL HISTORY:  Is married, has children.  Does not smoke or drink.   ALLERGIES:  She lists CODEINE and MORPHINE as causing nausea and SULAR  as causing headaches.   CURRENT MEDICATIONS:  1. Protonix 40 mg daily.  2. Aricept 10 mg daily.  3. Avapro 150 mg daily.  4. Verapamil 240 mg nightly.  5. Zoloft 50 mg daily.  6. Lasix 20 mg daily.  7. Detrol LA 4 mg daily.  8. Vitamin E 400 international units daily.  9. Aspirin 81 mg daily.  10.Nu-Iron one daily.  11.Tylenol two twice a day.  12.Caltrate Plus b.i.d.  13.B complex daily.  14.Potassium 20 mEq daily.  15.Claritin 10 mg daily.  16.Lipitor 20 mg daily.   REVIEW OF SYSTEMS:  See per above.   PHYSICAL EXAMINATION:  GENERAL:  Looks to feel ill.  HEENT:  TMs clear .  CHEST:  CTA.  HEART:  Regular.  ABDOMEN:  Soft.  EXTREMITIES:  No edema.  SKIN:  Warm, dry.   LABORATORY DATA:  Urine shows nitrites and leukocyte and has some WBCs  in it.  MET-7 overall looks good.  BUN/creatinine acceptable. CBC looks  good.  It should be noted that on orthostatics the patient had a 140/70  blood pressure laying down, 118/64 sitting up, and 100/54 standing.   ASSESSMENT/PLAN:  1. Gastroenteritis could be related to the kidney infection, but also      could be related to the medication.  We are going to go with      Rocephin IV and switch medicines when she is doing better to oral      cephalosporin rather than Cipro.  2. Orthostatic hypotension.  I think she is  sensitive to the blood      pressure medicines especially during this time of sickness.  Will      back off on those and follow accordingly.  3. Most likely will be able to get out of here within 1-2 days under      observation status.      Scott A. Gerda Diss, MD  Electronically Signed     SAL/MEDQ  D:  04/10/2007  T:  04/10/2007  Job:  [id number]

## 2011-04-08 NOTE — Discharge Summary (Signed)
[name] [name], [name] [name] NO.:  [id number]   MEDICAL RECORD NO.:  [id number]          PATIENT TYPE:  INP   LOCATION:  5014                         FACILITY:  [facility]   PHYSICIAN:  Loreta Ave, M.D. DATE OF BIRTH:  12/21/1923   DATE OF ADMISSION:  02/14/2005  DATE OF DISCHARGE:  02/24/2005                                 DISCHARGE SUMMARY   FINAL DIAGNOSES:  1.  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  2.  Cardiogenic shock.  3.  Hypertension, not otherwise specified.  4.  Esophageal reflux.  5.  Depressive disorder, not elsewhere classified.  6.  Type 2 diabetes mellitus.  7.  Hypotension, not otherwise specified.  8.  Gastric ulcer.   HISTORY OF PRESENT ILLNESS:  [age]-year-old white female presented  to our office with a history of chronic right knee pain and end-stage DJD  for preoperative evaluation.  She had progressive worsening of symptoms with  failed response of conservative treatment.  Significant decrease in her  daily activities due to the ongoing complaint.  Preop x-rays showed end-  stage DJD of the right knee with bone on bone changes, marginal osteophytes  and subchondral sclerosis.   PREADMISSION LABS:  WBC 6, hemoglobin 13.4, hematocrit 39.5, platelets 297.  PT at 11.7, INR 0.8, PTT 29.  Sodium 143, potassium 4, chloride 104, CO2 29,  glucose 102, BUN 21, creatinine 1.5, calcium 9.6, total protein 6.9, albumin  4, AST 27, ALT 22, alk phos 55, T-bili 0.7.   HOSPITAL COURSE:  On February 14, 2005 patient was taken to the [facility]  Operating Room and a right total knee replacement procedure performed.  Surgeon Loreta Ave, M.D. and assistant Genene Churn. Owens, P.A.-C.  EBL  100 mL.  Tourniquet time 2 hours.  There were no surgical or anesthesia  complications.  Two Hemovac drains placed.  Patient transferred to recovery  in stable condition.  February 15, 2005 patient had  good pain control.  Complained of sore throat.  Vital signs stable, afebrile.  Hemoglobin 10.7.  INR 0.9.  Dressing clean, dry and intact.  Neurovascularly intact.  Calf  mildly tender to palpation.  Chloraseptic spray ordered p.r.n.  Postop knee  x-rays showed ventral crack medial tibia stable.  PT and OT consults.  February 16, 2005 patient had good pain control.  No specific complaints.  Vital  signs stable, afebrile.  Hemoglobin 10.5.  Lytes stable.  INR 1.4.  Wound  looked good, staples intact.  No signs of infection.  Hemovac drains x2  pulled.  Calf nontender.  Neurovascularly intact.  Discontinued PCA and  Foley.  Hep-locked IV.  Evaluated by rehab.  February 17, 2005 patient  complained of calf pain on the right.  Family stated that she was less alert  than her usual and saying inappropriate things.  Patient with a history of  dementia.  No complaints of chest pain or shortness of breath.  Ordered a  venous Doppler of right lower extremity to rule out DVT.  Temperature 97.1,  pulse 102, respirations 20, blood pressure 151/59.  Hemoglobin 9.5.  Lytes  stable.  INR 1.9.  Patient alert to self but answered multiple questions  inappropriately.  Dressing clean, dry and intact.  Right proximal calf  mildly tender to palpation.  Neurovascularly intact distally.  Medicine  consult ordered.  Patient also seen by cardiology.  February 18, 2005 good pain  control right knee.  Coumadin and heparin held.  No complaints of chest pain  or shortness of breath.  Patient started on a second unit of packed red  blood cells.  Vital signs stable, afebrile.  Hemoglobin 6.  Sodium 136,  potassium 4.3, chloride 106, CO2 22, BUN 75, creatinine 1.3, glucose 207,  INR 2.8.  Venous Doppler right lower extremity negative for DVT.  Wound  looked good.  No signs of infection.  Staples intact.  Patient had positive  fecal occult blood test.  Applied bilateral foot pumps.  February 19, 2005  patient resting comfortably.  Mild  right knee pain.  Patient was found to  have upper GI bleed.  Temperature 100.6, pulse 97, respirations 26, blood  pressure 129/56.  Hemoglobin 9.5 after transfusion.  INR 1.1.  February 20, 2005  patient awake and alert.  Right knee comfortable.  Dressing clean, dry and  intact.  Neurovascularly intact distally.  February 21, 2005 patient doing much  better.  No complaints of pain.  Hemoglobin 11.2.  Lytes stable.  Temperature 99, pulse 68, blood pressure 134/37, respirations 15.  Wound  looked good.  No signs of infection.  Neurovascularly intact, calf  nontender.  GI bleed now stable.  Started on PPI.  February 22, 2005 patient  doing extremely well.  No specific complaints.  Vital signs stable,  afebrile.  INR 1.  Wound looked good.  No signs of infection.  Calf  nontender and neurovascularly intact distally.  February 23, 2005 no specific  complaints.  Alert and oriented x3.  Vital signs stable, afebrile.  Range of  motion 0-45 degrees.  INR 0.9.  Wound looked good, no signs of infection.  Neurovascularly intact and calf nontender.  February 24, 2005 vital signs  stable, afebrile.  INR 1.  Hemoglobin 12.9.  Wound looked good and staples  intact.  Patient feeling much better.  Started KCl 20 mEq p.o. once daily.  Rehab bed available and patient transferred.   DISPOSITION:  Transfer to rehab.   MEDICATIONS:  Continue all currently prescribed medications.   CONDITION:  Good and stable.   INSTRUCTIONS:  Patient will continue to work with physical therapy to  improve range of motion and strengthening.  Dressing changes p.r.n.  Staples  to be removed 2 weeks postop.  She will follow up in the office with Dr.  Eulah Pont 2-3 weeks postop for recheck.  Return sooner as needed.       JMO/MEDQ  D:  05/07/2005  T:  05/07/2005  Job:  [id number]

## 2011-04-08 NOTE — Op Note (Signed)
[name] [name], [name] [name]                        ACCOUNT NO.:  [id number]   MEDICAL RECORD NO.:  [id number]                   PATIENT TYPE:  AMB   LOCATION:  DAY                                  FACILITY:  [facility]   PHYSICIAN:  Lionel December, M.D.                 DATE OF BIRTH:  [birth year]   DATE OF PROCEDURE:  DATE OF DISCHARGE:                                 OPERATIVE REPORT   PROCEDURE:  Esophagogastroduodenoscopy followed by total colonoscopy.   INDICATIONS:  The patient is a 77 year old Caucasian  female with anemia  and heme positive stool. She has had some abdominal pain, nausea and may  have had melena. The procedure risks were reviewed with the patient and  informed consent was obtained.   PREMEDICATION:  1. Cetacaine for pharyngeal topical anesthesia.  2. Demerol 50 mg IV in a divided dose.  3. Versed 6 mg IV in a divided dose.   INSTRUMENT USED:  Olympus video system.   FINDINGS:  The procedure was performed in the endoscopy suite. The patient's  vital signs and O2 saturations were monitored during the procedure and  remained  stable.   PROCEDURES:  VASOVAGAL GASTRODUODENOSCOPY:  The patient was placed in the  left lateral position and the endoscope was passed through the oropharynx  without difficulty into the esophagus.   Esophagus:  The mucosa of the esophagus was normal throughout. The  squamocolumnar junction was unremarkable. A small sliding hiatal hernia.   Stomach:  It was empty and distended very well with insufflation.  Multiple  petechial hemorrhages were noted. Folds in the proximal stomach were normal.  Examination of the mucosa revealed irregularity and erythema at the body and  antrum but no erosions or ulcers were noted. Pyloric examination was patent.  The angularis and fundus were examined by retroflexing the scope and were  normal.   Duodenum:  Examination of the bulb, second and third part of the duodenum  was normal. The endoscope was  withdrawn and the patient was prepared for  procedure number 2.   TOTAL COLONOSCOPY:  A rectal examination was performed. She had soft skin  tags. She also had a tag posteriorly and to the left on digital examination.  This was freely mobile and felt to be a papilla.   The scope was placed in the rectum and advanced to the sigmoid colon and  beyond. The preparation was excellent, except she had some  stool in the  right colon. The scope was passed into the cecum which was identified by the  ileocecal valve. The blunt cecum was examined and no abnormality was noted.  There was a small ulcer right over the ileocecal valve, and another one just  above that. Pictures were taken, followed by biopsy and submitted in one  container.   The mucosa of the rest of the colon was normal, except there were two small  polyps in  the splenic flexure which were ablated by a cold biopsy. The  mucosa of the descending and sigmoid colon was normal. The rectal mucosa  similarly was normal. The scope was retroflexed to examine the anorectal  junction, and she had a large hemorrhoid below the dentate line and  prominent anal papilla. Pictures were taken for the record.   The endoscope was straightened and withdrawn. The patient tolerated the  procedure well.   FINAL DIAGNOSES:  1. Small hiatal hernia without endoscopic evidence of reflux esophagitis.  2. Nonerosive gastritis involving the body and antrum.  3. Two  small ulcers at the right colon, biopsy taken.  4. Two small polyps of ablated by cold biopsy from the splenic flexure.  5. Large external hemorrhoids and prominent anal papilla.   RECOMMENDATIONS:  1. She will resume her usual medications and at least for now continue her     aspirin at 81 mg q.d.  2. Her H&H will be checked along with H. pylori serology and I will be     contacting the patient with results of blood test and biopsy and further     recommendations.                                                Lionel December, M.D.    NR/MEDQ  D:  10/21/2002  T:  10/21/2002  Job:  [id number]   cc:   Lorin Picket A. Gerda Diss, M.D.  [address]  [place]  [place] [address]  Fax: [phone]

## 2011-04-08 NOTE — Consult Note (Signed)
   NAMEJILLANE, PO                        ACCOUNT NO.:  [id number]   MEDICAL RECORD NO.:  [id number]                   PATIENT TYPE:  INP   LOCATION:  A321                                 FACILITY:  [facility]   PHYSICIAN:  Scott A. Gerda Diss, M.D.               DATE OF BIRTH:  Jun 05, 1924   DATE OF CONSULTATION:  DATE OF DISCHARGE:  09/05/2002                                   CONSULTATION   DISCHARGE DIAGNOSIS:  Lumbar compression fracture.   HOSPITAL COURSE:  A 77 year old white female who was admitted with a lumbar  compression fracture.  She is doing somewhat better currently.  She is able  to move about with some assistance.  Physical therapy has been able to help  her.  She is getting some home equipment including a bed with a trapeze bar  and a bedside commode.  In addition, she will use oxycodone 5 mg 1/2 to 1  q.4-6h. p.r.n. pain and lactulose 1 tbsp b.i.d. to help keep stool soft.  She will use a Fleet's enema every four days if she does not have a bowel  movement.  She will have home health check in on her, along with home  physical therapy 2-3 times a week.  She will follow up in the office in  approximately one week.                                               Scott A. Gerda Diss, M.D.    SAL/MEDQ  D:  09/05/2002  T:  09/05/2002  Job:  [id number]

## 2011-04-08 NOTE — Consult Note (Signed)
NAME:  [name] [name], [name] [name]                  ACCOUNT NO.:  [id number]   MEDICAL RECORD NO.:  [id number]                  PATIENT TYPE:   LOCATION:                                       FACILITY:  [facility]   PHYSICIAN:  Lionel December, M.D.                 DATE OF BIRTH:  08-11-1924   DATE OF CONSULTATION:  09/16/2002  DATE OF DISCHARGE:                                   CONSULTATION   GASTROENTEROLOGY CONSULTATION:   REASON FOR CONSULTATION:  Anemia, suspected to have iron-deficiency - the  patient referred for total colonoscopy.   HISTORY OF PRESENT ILLNESS:  The patient is a 77 year old Caucasian female  who is referred through courtesy of Dr. Lilyan Punt for consideration of  total colonoscopy.  She was recently found to be anemic.  Her hemoglobin on  July 23, 2002 was 11.4 and hematocrit was 32.6.  On August 07, 2002  these were 10.6 and 32.6.  Her H&H were repeated on August 21, 2002 and were  10.8 and 84.2 and platelet count was 216, and her MCV was 103.6; MCV on  prior CBCs were normal.  The patient meanwhile has not experienced any  rectal bleeding or hematemesis.  Her stool has been black since she has been  on iron therapy but she does not remember if she had melena prior to that.  She denies abdominal pain, nausea, vomiting, heartburn, or dysphagia.  She  had her esophagus dilated about 3-1/2 years ago and has not experienced any  dysphagia.  She has had problems with constipation more so since she has  been on pain medication for which she is taking high fiber diet and  lactulose.  Today she is complaining of pain in her left iliac area.  This  pain is intense when she walks or moves around.  She states when she has  pain that she gets nauseated.  She feels quite frustrated because of this  pain.  She so far has not noted much relief with the pain medication.  She  is scheduled to have bone density study with Dr. Patrecia Pace in near future.  She is not having a good  appetite recently however, she has not lost any  weight.  She also denies exertional dyspnea or chest pain.   CURRENT MEDICATIONS:  She is on Avapro 150 q.d., Avandia 8 mg q.d.,  verapamil 260 mg q.d., Zoloft 50 mg q.d., Lasix 20 mg q.d., Slow Fe q.d.,  vitamin E 1000 units q.d., Caltrate 600 mg b.i.d., ASA 81 mg q.d., Tylenol  two tablets b.i.d., oxycodone 5 mg t.i.d. p.r.n. and lactulose one  tablespoonful b.i.d.  She does not take any OTC NSAIDs.   PAST MEDICAL HISTORY:  Medical problems include hypertension, diabetes  mellitus, and she also has history of fatty liver.  In February 2000 she  underwent EGD because of epigastric pain and dysphagia.  She was found to  have small sliding hiatal  hernia with erosive reflux esophagitis  ______________.  Her esophagus was dilated.  She was on Prevacid for a  while.  She has single-vessel coronary artery disease.  She had cardiac cath  about 1 month ago and she had 70% stenosis to an artery and was being  managed medically.  She fell about 3 weeks ago and since then she has a pain  in her left hip.  She was hospitalized a few days and noted to have what  sounds like a fracture on MRI.  According to her daughter, who is here with  the patient, she has been seen by neurologists and felt to have symptoms  suggestive of Parkinson's disease.   PAST SURGICAL HISTORY:  Previous surgeries include hysterectomy, open  cholecystectomy with removal of common duct stones about 18-20 years ago.  She had neck surgery for disk problems 2 years ago and she had decompression  of right carpal tunnel.   ALLERGIES:  MORPHINE and CODEINE causing intractable nausea.   FAMILY HISTORY:  Both parents are deceased; father was a diabetic.  One  brother died of stomach cancer age 27 and two others died of old age; they  were both diabetic.  She lost one sister with diabetes as well.  She has  four brothers and three sisters living.   SOCIAL HISTORY:  She is married.   She has six children.  One of her  daughters who is under my care was diagnosed with Addison's disease about 15  years ago.  The patient worked at [employer] for over 25 years;  she is now retired.  She does not smoke cigarettes or drink alcohol.   PHYSICAL EXAMINATION:  GENERAL: Pleasant mildly obese Caucasian female who  appears to be in pain as she moves from chair to examination table.  VITAL SIGNS: She weighs 190-1/2 pounds, she is 5 feet 1 inch tall.  Pulse 80  per minute, blood pressure 140/52.  HEENT: Conjunctivae are pink; sclerae are nonicteric.  Oropharyngeal mucosa  normal; dentition satisfactory condition.  NECK: Without masses or thyromegaly.  No carotid bruits noted.  CARDIAC: Regular rhythm, normal S1 and S2.  She has faint systolic ejection  murmur at aortic area.  LUNGS: Clear to auscultation.  ABDOMEN: Full but soft, nontender, without organomegaly or masses.  RECTAL: Deferred as felt would be difficult for her with lying on the left  side.  EXTREMITIES: She does not have peripheral edema, clubbing, or koilonychia.   ASSESSMENT:  The patient is a 77 year old Caucasian female with multiple  medical problems who was recently found to be anemic.  There is no history  of gastrointestinal bleeding.  Her MCV is elevated.  She has been on iron  for few weeks and there appears to be slight improvement in her H&H.  I am  not sure whether she has had iron studies, B12, folate levels by Dr. Lilyan Punt.  If not, these need to be done.  I agree she needs to have a  colonoscopy no matter what however, if she is documented to have iron-  deficiency anemia, would also recommend EGD since she is on low-dose aspirin  and has history of erosive reflux esophagitis.  Since she appears to be in a  lot of pain because of left iliac (?) fracture we will hold off with  endoscopic evaluation until she is more comfortable.  RECOMMENDATIONS:  We will consider serum iron and TIBC,  ferritin, B12 and  folate levels unless these  have been done by Dr. Lilyan Punt.  We will  arrange for total colonoscopy as soon as she is relatively pain-free.  We  will need to hold off her iron therapy for a few days prior to colonoscopy.  If indeed IDA is confirmed, we will also do EGD.   I would like to thank Dr. Lilyan Punt for giving [place] the opportunity to  participate in the care of this nice lady.                                               Lionel December, M.D.    NR/MEDQ  D:  09/16/2002  T:  09/18/2002  Job:  [id number]   cc:   Lorin Picket A. Gerda Diss, M.D.

## 2011-04-08 NOTE — Consult Note (Signed)
[name] [name], SHEU NO.:  [id number]   MEDICAL RECORD NO.:  [id number]          PATIENT TYPE:  INP   LOCATION:  2922                         FACILITY:  [facility]   PHYSICIAN:  Lina Sar, M.D. [facility]  DATE OF BIRTH:  12-08-1923   DATE OF CONSULTATION:  DATE OF DISCHARGE:                                   CONSULTATION   CONSULTING PHYSICIAN:  Lina Sar, M.D. [facility].   PROCEDURE:  Upper endoscopy.   INDICATIONS:  This 77 year old white female had developed acute upper GI  bleed demonstrating a rapid drop in H&H as well as past history of melena.  A nasogastric tube revealed a large amount of bright red blood.  She was  transfused a total of six units of packed cells in the last 24 hours.  Her  INR initially was elevated over therapeutic range necessitating the use of  vitamin K and fresh frozen plasma.  This morning her INR was down to 1.1.  She is undergoing upper endoscopy to further evaluate her acute upper GI  bleed.   ENDOSCOPE:  Fujinon single channel videoendoscope.   SEDATION:  Versed 2 mg IV, Fentanyl 35 mcg IV.   FINDINGS:  Fujinon single channel videoendoscope was passed into the  posterior pharynx into the esophagus.  The patient was monitored by pulse  oximeter.  Oxygen saturations were between 93-96% on 4 liters of nasal  oxygen.  __________  mucosa was unremarkable.  Squamocolumnar junction was  normal.  There was no evidence of esophagitis or any NG tube injury with  erosions.  No significant hernia was noted.   Stomach.  Stomach was insufflated with air and showed normal appearing  gastric mucosa in the body of the stomach.  There was no residual blood in  the stomach.  A small circular gastric ulcer was noted on greater curvature  of the stomach between the body and the gastric antrum.  It measured about 6-  to -7-mm in diameter.  It had slightly raised edges and white crater with a  visible vessel which was not showing any signs of stigmata of  recent  bleeding.  There was surrounding erosions in the prepyloric antra which were  most likely related to nasogastric trauma.  Pyloric outlet was normal.  Retroflexion of endoscope revealed a small hiatal hernia but no AVM  malformations or any other lesions.   Duodenum.  Duodenum bulb and descending duodenum was unremarkable.  Endoscope was then brought back into the stomach and biopsies were taken  from the gastric antrum for H. pylori, also biopsies were taken  circumferentially around the gastric ulcer outside of the crater to rule out  malignant gastric ulcer.  The patient tolerated the procedure well.   IMPRESSION:  1.  Gastric ulcer with stigmata of remote bleeding but no active bleeding,      status post biopsies and CLO test.  2.  Nasogastric tube induced trauma.  No bleeding.   PLAN:  1.  Since the patient shows no evidence of active bleeding, we will      discontinue the nasogastric suction and start  her on oral clear liquids.  2.  Continue Protonix infusion and __________  anticoagulation.  3.  I suggest repeating the upper endoscopy in 4-6 weeks to assure complete      healing of the gastric ulcer.  4.  We will be monitoring her H&H closely.      DB/MEDQ  D:  02/19/2005  T:  02/19/2005  Job:  [id number]

## 2011-04-08 NOTE — Letter (Signed)
March 14, 2007    Scott A. Gerda Diss, MD  [address]  [place], [place] [address]   RE:  BLYTHE, VEACH  MRN:  [id number]  /  DOB:  [birth year]   Dear Lorin Picket:   Ms. Peeler returns to the office for continued assessment and treatment  of coronary disease and cardiovascular risk factors. Since I last saw  her one year ago, she has done beautifully. She continues to complain of  arthritic discomfort, particularly in the right leg, where she has  undergone prior total knee replacement surgery. These symptoms did not  vary based upon whether or not she was taking a statin drug. She has had  no chest pain nor dyspnea. She has had no serious medical problems over  the past year.   Control of hypertension has been good. Control of diabetes has been  excellent - a recent hemoglobin level of 6.2. She had been followed by  Dr. Mariel Sleet for an iron deficiency anemia, but this has now resolved.  A recent basic metabolic profile was also normal.   CURRENT MEDICATIONS:  1. Aricept 1 daily.  2. Avapro 150 mg daily.  3. Verapamil 240 mg daily.  4. Zoloft 50 mg daily.  5. Furosemide 20 mg daily.  6. Aspirin 81 mg daily.  7. Namenda 10 mg b.i.d.  8. Enablex.  9. Cyclobenzaprine 1 q.h.s.  10.Kay Ciel 20 mEq daily.  11.Atorvastatin 20 mg daily.  12.She uses Tylenol p.r.n. for arthritic pain.   PHYSICAL EXAMINATION:  GENERAL:  Pleasant, overweight woman in no acute  distress.  VITAL SIGNS:  The weight is 191, 8 pounds less than last year. Blood  pressure 110/60, heart rate 88 and regular, respirations 16.  NECK:  No jugular venous distention; no carotid bruits.  LUNGS:  Clear.  CARDIAC:  Distant first and second heart sounds.  ABDOMEN:  Soft and nontender; no organomegaly.  EXTREMITIES:  Distal pulses intact; no edema.   IMPRESSION:  Ms. Ortlieb is doing very well from a cardiovascular  standpoint. We will obtain a lipid profile and hepatic profile to  monitor her statin therapy. Blood  pressure control is excellent. There  are symptoms to suggest recurrent myocardial ischemia. I will plan to  see this nice woman again in 1 year.    Sincerely,      Gerrit Friends. Dietrich Pates, MD, [facility]  Electronically Signed    RMR/MedQ  DD: 03/14/2007  DT: 03/14/2007  Job #: [id number]

## 2011-04-08 NOTE — Op Note (Signed)
[place]  Patient:    [name] [name], [name] [name]                     MRN: [id number] Proc. Date: 07/12/00 Adm. Date:  [id number] Disc. Date: [id number] Attending:  Barton Fanny                           Operative Report  PREOPERATIVE DIAGNOSIS:  Bilateral carpal tunnel syndrome.  POSTOPERATIVE DIAGNOSIS:  Bilateral carpal tunnel syndrome.  PROCEDURE:  Right carpal tunnel release.  SURGEON:  Hewitt Shorts, M.D.  ANESTHESIA:  Bier block with intravenous sedation.  INDICATIONS:  Patient is a 77 year old woman with bilateral carpal tunnel syndrome, right worse than left for whom bilateral carpal tunnel releases are planned.  It is elected to proceed with the right carpal tunnel release today that being the more affected side with plans to proceed with left carpal tunnel release in 4-6 weeks.  PROCEDURE:  Patient brought to the operating room.  The right upper extremity was anesthetized by the anesthesia service with a Bier block, which the patient tolerated well.  The right upper extremity was prepped with Betadine soap and solution and draped in a sterile fashion.  Then, a curvilinear incision was made just medial to the thenar crease beginning just distal to the most distal carpal crease.  Dissection was carried down through the subcutaneous tissue.  Bipolar cautery was used as needed to maintain hemostasis.  Dissection was carried down to the transverse carpal ligament, which was opened from a distal to proximal approach.  It was opened fully in its entire distal to proximal extent and good decompression of the carpal tunnel was achieved.  Hemostasis was again checked for and established with bipolar cautery and once the decompression was completed and hemostasis established, we proceeded with closure.  The subcutaneous tissue was approximated with interrupted inverted 2-0 undyed Vicryl sutures.  The skin edges were closed with an  interrupted, horizontal mattress suture using 3-0 nylon.  The wound was dressed with Adaptic and gauze fluffs and wrapped with Kling.  The patient tolerated the procedure well.  The estimated blood loss was nil.  Sponge and needle counts were correct.  Following surgery, the patient had the tourniquet released, which she tolerated well and is being transferred to the recovery room for further care. DD:  07/12/00 TD:  07/12/00 Job: [id number]

## 2011-04-08 NOTE — Op Note (Signed)
[name] [name], [name] [name] NO.:  [id number]   MEDICAL RECORD NO.:  [id number]          PATIENT TYPE:  INP   LOCATION:  2550                         FACILITY:  [facility]   PHYSICIAN:  Loreta Ave, M.D. DATE OF BIRTH:  1924-05-27   DATE OF PROCEDURE:  02/14/2005  DATE OF DISCHARGE:                                 OPERATIVE REPORT   PREOPERATIVE DIAGNOSIS:  End-stage degenerative arthritis, right knee, with  varus alignment and flexion contracture.   POSTOPERATIVE DIAGNOSIS:  End-stage degenerative arthritis, right knee, with  varus alignment and flexion contracture.   OPERATION PERFORMED:  Computer navigation assisted total knee replacement,  Depuy prosthesis.  Cemented size 2.5 right femoral component.  Cemented 2.5  tibial component with a 10 mm rotating platform, polyethylene insert.  Cemented 35 mm patellar component.   SURGEON:  Loreta Ave, M.D.   ASSISTANT:  Genene Churn. Denton Meek.   ANESTHESIA:  General.   BLOOD LOSS:  Less than 100 cc.   BLOOD GIVEN:  None.   SPECIMENS:  Excised bone and soft tissue.   TOURNIQUET TIME:  Two hours.   COMPLICATIONS:  None.   DRAINS:  Hemovac times two.   DESCRIPTION OF PROCEDURE:  The patient was brought to the operating room and  after adequate anesthesia had been obtained, the right knee examined.  A 5  degree flexion contracture alignment, 5 of varus correctable just about to  neutral.  Further flexion to about 100 degrees.  Tourniquet applied.  Prepped and draped in the usual sterile fashion.  Exsanguinated with  elevation and Esmarch.  Tourniquet inflated to 350 mmHg.  Straight incision  above the patella down to the tibial tubercle.  Medial parapatellar  arthrotomy.  Grade 4 changes throughout.  Medial capsular release.  Remnants  of  menisci and cruciate ligaments, periarticular spurs, excessive fat and  scar within the knee all removed to establish all land marks and to proceed.  We then placed guides  for the femoral and tibial array for computer  navigation, two pins in each.  The computer array was then attached to the  pins and all landmarks were established through the computer interface to  establish mechanical axis as well as normal landmarks in the femur, tibia  and land marks then generated at the center of the ankle and the center of  the hip.  Utilizing computer assistance, I then used the appropriate jigs to  make the distal femur cut with appropriate valgus resecting 10 mm.  When  that was complete, the computer assistance was then utilized to guide the  distal femoral cuts after sizing for a 2.5 femoral component.  Those were  completed with excellent resectioning and fitting with a 2.5 trial after the  notches were made with the appropriate trials.  I was able to get a nice  flush cut on the femur, and get appropriate valgus positioning of the  component with rotation, matching epicondylar axis.   Attention was turned to the tibia.  Proximal removing 10 mm with the  computer assisted device so that I was perpendicular to the  shaft and had a  0 degree cut.  tibia was measured for a 2.5 component.  The patella was then  exposed and the posterior 8.5 to 9 mm were removed leaving generous bone  medial and lateral but with a good resection for resurfacing.  Sized for a  35 mm component.  Drill holes were made.  Trials were then put in place.  Nice coverage of the tibia with a 2.5 metallic tibial component.  Good  coverage, rotation and alignment with the 2.5 femoral component.  After  appropriate trialing, I chose a 10 mm rotating platform insert for the tibia  which gave me full extension, full flexion and good stability in flexion and  extension.  Also good patellofemoral tracking.  The tibia was then reamed  for that component.  All trials removed.  Copious irrigation of the entire  knee with a pulse irrigating device.  Very osteoporotic and even some lift  off of the  lateral wall of the tibia just with pulling on soft tissue and  using irrigation.  Cement was then prepared and placed on all components  which were then firmly seated.  Once the femur and tibia were in place and  the cement removed, the polyethylene was attached to the tibia.  Knee was  held in full extension until the cement hardened.   Examined at completion.  Excellent alignment with computer as well as visual  confirmation, just very slight valgus on a mechanical axis.  Excellent  stability.  Full extension, full flexion, good patellofemoral tracking.  Hemovacs were placed.  Wound irrigated.  The arthrotomy closed with #1  Vicryl, skin and subcutaneous tissue with Vicryl and staples.  Prior to  closure, the tourniquet then deflated, hemostasis with cautery.  Once the  wound was closed, sterile compressive dressing applied.  Tourniquet was  deflated previously and now removed.  Knee immobilizer applied.  Anesthesia  reversed.  Brought to recovery room.  Tolerated surgery well.  No  complications.      DFM/MEDQ  D:  02/14/2005  T:  02/14/2005  Job:  [id number]

## 2011-04-08 NOTE — Consult Note (Signed)
[name] [name], [name] NO.:  [id number]   MEDICAL RECORD NO.:  [id number]          PATIENT TYPE:  INP   LOCATION:  2922                         FACILITY:  [facility]   PHYSICIAN:   Bing, M.D.  DATE OF BIRTH:  02/28/1924   DATE OF CONSULTATION:  DATE OF DISCHARGE:                                   CONSULTATION   CARDIOLOGY CONSULTATION   PRIMARY CARE PHYSICIAN:  Dr. Lilyan Punt, Dr. Loran Senters.   REFERRING DOCTOR:  Derenda Mis, M.D.   HISTORY OF PRESENT ILLNESS:  A 77 year old woman who underwent right total  knee replacement surgery on 02/14/05, referred for assessment of hypotension  and altered mental status.  [name] [name] is known to [place] as the result of an  assessment for atypical chest pain in September 2003.  She underwent cardiac  catheterization that showed normal left ventricular systolic function, no  significant valvular disease, and single vessel disease with a 70% diagonal  lesion.  Medical treatment and cardiology followup was anticipated, but  never realized.  The patient has apparently done well from a cardiac  standpoint with no subsequent admissions for cardiac symptoms.  She  underwent an adenosine stress Myoview study prior to this admission, which  apparently was negative for ischemia or infarction.  She did well initially  postoperatively but was noted to be confused over the past 12-24 hours.  She  was cool and vasoconstricted this evening with marginal blood pressure  prompting transfer to the CCU with a referral from cardiology assessment.   The patient does have multiple vascular risk factors including diabetes and  hypertension.  Lipid status is unknown.   Past medical history is also notable for possible Parkinson's disease as  well as a history of TIA.  Prior surgeries have included hysterectomy,  cholecystectomy, and a laparoscopic procedure on her right knee.   CARDIAC MEDICATIONS ON ADMISSION:  1 Avapro 150 mg q.d.  1.  Verapamil 240 mg q.d.  2.  Furosemide 20 mg q.d.  3.  Aspirin.  4.  Diabetes is controlled with an oral agent.   The patient reports no true drug allergies. She has had adverse reactions to  morphine and codeine.   FAMILY HISTORY:  Positive for diabetes;  a sister has Alzheimer's disease.   SOCIAL HISTORY:  No use of tobacco products or alcohol.   REVIEW OF SYSTEMS:  Unobtainable.   PHYSICAL EXAMINATION:  GENERAL:  Lethargic but responsive, pale older woman  in no acute distress.  VITAL SIGNS:  Blood pressure 70/50 in the right arm and 90/60 in the left  arm, heart rate 100 and regular, respirations 24 and unlabored.  NECK:  Bounding carotid upstrokes; bilateral carotid bruits versus  transmitted murmur.  No jugular venous distention.  HEENT:  Extremely pale conjunctiva.  LUNGS:  A few basilar rales  CARDIAC:  Grade 2-3 early and mid systolic ejection murmur that is mid  peaking and radiates widely across the precordium; decreased aortic  component in the second heart sound.  ABDOMEN:  Soft and nontender; bowel sounds present; no bruits; no  organomegaly.,  EXTREMITIES:  Bandaged right knee; trace peripheral edema; distal pulses  intact; ecchymoses of the right leg.   Stool is reportedly hemoccult positive.  BUN is 75 with a creatinine of 1.3.  Hemoglobin 6.  EKG:  Normal sinus rhythm; left bundle branch block which is  reportedly chronic.   Chest x-ray reviewed.  Cardiomyopathy, prominent pulmonary arteries,  increased interstitial markings without edema or vascular redistribution.   IMPRESSION:  [name] [name] has significant anemia and hypovolemia following  total knee replacement surgery.  She has received intravenous fluids with  some improvement in hemodynamics and mental status.  Transfusions are  planned.  Anticoagulation will be held .  At this point, a GI bleed is most  likely.  She has a history of iron deficiency anemia evaluated by a  gastroenterologist in 2003.   The possibility of soft tissue bleed should  also be kept in mind.   On my exam, she has a degree of aortic stenosis.  This will be further  assessed with an echocardiogram.  Due to hypotension and uninterpretable EKG  as the result of left bundle branch block, cardiac markers will be obtained.   We greatly appreciate this consultation and will be happy to follow [name] with you and to see her subsequent to discharge.      RR/MEDQ  D:  02/18/2005  T:  02/18/2005  Job:  [id number]

## 2011-04-08 NOTE — Procedures (Signed)
NAMELORRANE, MCCAY              ACCOUNT NO.:  [id number]   MEDICAL RECORD NO.:  [id number]          PATIENT TYPE:  REC   LOCATION:  RAD                           FACILITY:  [facility]   PHYSICIAN:  Scott A. Gerda Diss, MD    DATE OF BIRTH:  1924-03-27   DATE OF PROCEDURE:  01/31/2005  DATE OF DISCHARGE:                                    STRESS TEST   INDICATION:  Chest discomfort, preoperative.   Resting EKG: Left bundle branch block, slight ST segment depression in II  and III.   Blood pressure response to Adenosine - there was no hypertensive spells.   Rhythm response to exercise - the patient had a couple of sinus pauses, but  nothing significant or symptomatic.   ST segment response to Adenosine - there was ST segment depressions  significant in II and III.   Symptoms with Adenosine - chest tightness, slight nausea, headache; went  away once the medication was stopped after the proper length of time for  injection.   Post Adenosine monitoring - uneventful. The patient states her all symptoms  went away and EKGs became back toward normal/baseline for the patient. Blood  pressure stable.   Interpretation - Adenosine studies were done without any significant  untoward effect; await images and that will certainly tell us how the  patient's heart is.      SAL/MEDQ  D:  01/31/2005  T:  01/31/2005  Job:  [id number]

## 2011-04-08 NOTE — H&P (Signed)
   NAMEMYRANDA, PAVONE                        ACCOUNT NO.:  [id number]   MEDICAL RECORD NO.:  [id number]                   PATIENT TYPE:  INP   LOCATION:  A321                                 FACILITY:  [facility]   PHYSICIAN:  Scott A. Gerda Diss, M.D.               DATE OF BIRTH:  1924/06/25   DATE OF ADMISSION:  09/02/2002  DATE OF DISCHARGE:                                HISTORY & PHYSICAL   CHIEF COMPLAINT:  Low back pain.   HISTORY OF PRESENT ILLNESS:  This 77 year old white female fell last week  after stumbling on cement.  She slammed on the side of her body, complains  of severe pain and discomfort in the lower back.  Initial x-rays of the  lumbar and hip area were negative.  The patient had continued pain and  unable to control it with Lortab.  In addition to this she has had  significant constipation, slight nausea, no vomiting.  On examination in the  office, severe low back pain, difficulty moving about.  She was admitted  under observation and CT scan was done of the lumbar region which showed a  compression fracture.  She was then transferred over to being an admission.   PAST MEDICAL HISTORY:  HTN, diabetes, partial blindness.   FAMILY HISTORY:  Noncontributory.   SOCIAL HISTORY:  Lives at home with her husband.   MEDICATIONS:  On numerous medicines; see order list.   REVIEW OF SYSTEMS:  Per above.  No nausea or vomiting currently.   PHYSICAL EXAMINATION:  HEENT:  Benign.  LUNGS:  Clear.  HEART:  Regular.  MUSCULOSKELETAL:  Low back moderate to severe tenderness in the lumbar area.  Difficulty in moving about and needs assistance with all functions.  EXTREMITIES:  No edema.   LABORATORY DATA:  CT scan as per noted.  Labs not indicated.   ASSESSMENT AND PLAN:  Lumbar compression fracture - Will use trapeze bar bed  along with pain control, physical therapy, and also do an enema to help with  constipation.  Expect the patient to be in the hospital the next few  days  until she can move about in a better fashion.                                               Scott A. Gerda Diss, M.D.    SAL/MEDQ  D:  09/05/2002  T:  09/05/2002  Job:  [id number]

## 2011-04-08 NOTE — Op Note (Signed)
[facility]  Patient:    [name] [name], [name] [name]                     MRN: [id number] Proc. Date: 01/29/01 Adm. Date:  [id number] Attending:  Twana First                           Operative Report  PREOPERATIVE DIAGNOSIS:  Right knee medial meniscus tear, with tricompartmental chondromalacia.  POSTOPERATIVE DIAGNOSIS:  Right knee medial and lateral meniscal tears, with tricompartmental chondromalacia.  PROCEDURE: 1. Right knee examination under anesthesia, followed by an arthroscopic    partial medial and lateral meniscectomies. 2. Right knee tricompartmental chondroplasty.  SURGEON:  Elana Alm. Thurston Hole, M.D.  ASSISTANT:  Arlys John D. Petrarca, P.A.-C.  ANESTHESIA:  Local and MAC.  OPERATIVE TIME:  30 minutes.  COMPLICATIONS:  None.  INDICATIONS:  [name] [name] is a 77 year old woman who has had significant right knee pain for the past six months, increasing in nature, with signs and symptoms, and an MRI documenting a medial meniscal tear, who has failed conservative care, and is now to undergo an arthroscopy.  DESCRIPTION OF PROCEDURE:  [name] [name] is brought to the operating room on January 29, 2001, after a block had been placed in the holding room.  She was placed on the operating room table in the supine position.  Her right knee was examined under anesthesia.  Range of motion was 0-125 degrees, 1-2+ crepitation.  The knee was stable to ligamentous examination with normal patella tracking.  The right leg was prepped using sterile Betadine and draped using the sterile technique.  Originally through an inferior lateral portal, the arthroscope with a pump attached was placed.  Through an inferior medial portal an arthroscopic probe was placed.  On initial inspection of the medial compartment, she had 75% grade 3 chondromalacia of the medial femoral condyle and medial femoral plateau, which was debrided.  The medial meniscus was probed, and she  had a split complex tear of the posterior and medial horn, of which 50%-60% was resected back to a stable rim.  The inner condylar notch was inspected.  The anterior and posterior cruciate ligaments were normal.  The lateral compartment was inspected.  There was 25%-30% grade 3 chondromalacia of the lateral femoral condyle and lateral tibial plateau, which was debrided. The lateral meniscus was probed, and she had a tear of the inner surface of the posterior and lateral horn, of which 25%-30% was resected back to a stable rim.  The patellofemoral joint was inspected.  There was 50%-75% grade 3 chondromalacia on the patella and femoral groove, which was debrided.  The patella tracked normally.  Moderate synovitis in the medial and lateral gutters was debrided, otherwise were free of pathology.  After this was done, it was felt that all of the pathology had been satisfactorily addressed.  The instruments were removed.  The portals were closed with #3-0 nylon sutures, and injected with 0.25% Marcaine with epinephrine.  Sterile dressings were applied, and the patient was awakened and taken to the recovery room in stable condition.  FOLLOW-UP CARE:  [name] [name] will be followed as an outpatient on Talwin and Voltaren.  I will see her back in the office in one week for sutures out and followup.DD:  01/29/01 TD:  01/29/01 Job: [id number]

## 2011-04-08 NOTE — Discharge Summary (Signed)
[name] [name], [name] [name] NO.:  [id number]   MEDICAL RECORD NO.:  [id number]          PATIENT TYPE:  INP   LOCATION:                               FACILITY:  [facility]   PHYSICIAN:  [name] [name], [name] ThomasDATE OF BIRTH:  1924-07-05   DATE OF ADMISSION:  02/24/2005  DATE OF DISCHARGE:  03/02/2005                                 DISCHARGE SUMMARY   DISCHARGE DIAGNOSES:  1.  Right total knee replacement on March 27 secondary to degenerative joint      disease.  2.  Pain management.  3.  Anemia with gastric ulcer.  4.  Dementia.  5.  Non-insulin-dependent diabetes mellitus.  6.  Hypertension.  7.  Hypokalemia.  8.  Depression.   HISTORY OF PRESENT ILLNESS:  A 77 year old white female with a history of  dementia admitted March 27 with end-stage changes in the right knee and no  relief with conservative care.  Underwent a right total knee replacement on  March 27 per Dr. Mckinley [name].  Placed on Coumadin for deep venous  thrombosis prophylaxis.  Weightbearing as tolerated.  Obtained control with  PCA morphine discontinued March 29.  Doppler studies lower extremities March  30 negative for deep venous thrombosis.  On March 31 routine follow up CBC  with hemoglobin 9.5, decreased to 6.0.  She was transfused 2 units of packed  red blood cells on March 31.  Coumadin discontinued secondary to  questionable upper gastrointestinal bleed.  Echocardiogram March 31 with no  obvious valve disease.  Follow up per cardiology services Dr. Dietrich [name].  Cardiac markers negative.  Endoscopy April 1, per Dr. Lina [name] positive  for gastric ulcer.  No active bleeding.  She was transfused a total of 6  units of packed red blood cells and advised to hold on any anticoagulants  and plan would be to rescope in 4-6 weeks.  She was placed on PPIs x7 days,  twice daily and then decrease to daily on 02/28/2005.  She was admitted to  subacute care services.   PAST MEDICAL HISTORY:  See discharge  diagnoses.  No alcohol or tobacco.   ALLERGIES:  MORPHINE and CODEINE.   SOCIAL HISTORY:  Lives with husband in [place], husband and family came,  assist on discharge, 1 level home, 4 steps to entry.   MEDICATIONS PRIOR TO ADMISSION:  1.  Eye drops.  2.  Aricept 10 mg daily.  3.  Avapro 150 mg daily.  4.  Avandia 8 mg daily.  5.  Verapamil 240 mg daily.  6.  Zoloft 50 mg daily.  7.  Lasix 20 mg daily.  8.  Claritin 10 mg daily.  9.  Detrol 4 mg daily.  10. Aspirin daily.   HOSPITAL COURSE:  Patient with progressive gains while on rehab services,  with therapies initiated daily.  The following issues are followed during  the patient's rehab course.  Pertaining to [name]'s right total knee  arthroplasty, surgical site healing nicely.  No signs of infection.  Staples  have been removed.  She was ambulating extended distances with a walker.  Weightbearing as tolerated.  Latest CPM machine of 60 degrees and if this  remained accurate she would have a home CPM machine.  Pain management merely  with the use of Tylenol and Robaxin.  Initial opportunity to use narcotics  on the acute side, hastened the patient's confusion.  These were  discontinued at family's request.  She had received venous Doppler studies  on 02/17/2005 showing no signs of deep venous thrombosis.  Her Coumadin for  deep venous thrombosis had been discontinued due to anemia with findings of  gastric ulcer.  It was advised for gastroenterology services of Dr. Lina [name] to hold on any anticoagulation.  She would be rescoped in 4-6 weeks.  Also noted patient on aspirin prior to admission for coronary artery disease  followed by Dr. Dietrich [name] of cardiology services.  This, again, would be held  and resumed at the discretion of gastroenterology.  She remained on her  Aricept for a history of dementia.  She was cooperative with staff.  She was  felt to be at the baseline per family.  Her blood sugars were within  normal  limits with latest blood sugars of 118, 144, and 115 on Avandia 8 mg daily.  Blood pressure is controlled with diastolic pressures of 71 and 72.  She  remained on her Avapro, verapamil, and Lasix; again, with ongoing  monitoring.  She had a history of depression.  She continued on Zoloft  without issue.  She was ambulating extended household distances with a  walker.  Full family teaching was completed.  Supervision would be provided,  as needed, at home.  She would follow up with Dr. Mckinley [name] of  orthopedic services as advised.  Her latest follow up CPM machine, now, was  graded at 77 years, this was much improved per orthopedic services upon  her discharge home.   LATEST LABS:  Showed a hemoglobin 11.5, hematocrit 34.  Sodium 143,  potassium 3.4, BUN 11, creatinine 0.9.   DISCHARGE MEDICATIONS:  1.  Claritin 10 mg daily.  2.  Prednisone Forte 1 drop right eye daily.  3.  Protonix 40 mg daily.  4.  Aricept 10 mg at bedtime.  5.  Avapro 150 mg daily.  6.  Avandia 8 mg daily.  7.  Verapamil 240 mg at bedtime.  8.  Detrol 4 mg daily.  9.  Zoloft 50 mg daily.  10. Lasix 20 mg daily.  11. Potassium chloride 20 mEq daily.  12. Tylenol as needed.   ACTIVITY:  As tolerated.   DIET:  Diabetic diet.   WOUND CARE:  Cleanse incision daily with warm soap and water.   SPECIAL INSTRUCTIONS:  1.  Home health with physical and occupational therapy.  2.  She should follow up with Dr. Lina [name] of gastroenterology services      for follow up endoscopy after recent gastric ulcer, upper      gastrointestinal bleed, and consideration of resuming aspirin as prior      to hospital admission for history of coronary artery disease.      DA/MEDQ  D:  03/01/2005  T:  03/01/2005  Job:  [id number]   cc:   [name] [name], M.D.  [address] [place]  [place] [address]  Fax: [phone]   Loreta Ave, M.D. [address]  [place] [address]  Fax: [phone]   [name] [name], M.D. [facility]   [place] Bing, M.D.   Scott A.  Gerda Diss, MD  [address]  [place]  [place] [address]  Fax: [phone]

## 2011-04-08 NOTE — Cardiovascular Report (Signed)
[name] [name], [name] [name]                        ACCOUNT NO.:  [id number]   MEDICAL RECORD NO.:  [id number]                   PATIENT TYPE:  OIB   LOCATION:  2855                                 FACILITY:  [facility]   PHYSICIAN:  Arturo Morton. Riley Kill, M.D. [facility]         DATE OF BIRTH:  05/28/1924   DATE OF PROCEDURE:  DATE OF DISCHARGE:  08/02/2002                              CARDIAC CATHETERIZATION   INDICATIONS:  The patient is a 77 year old woman who has had some recurrent  chest pain. The exact etiology of this has been uncertain. She has had an  extensive work-up. The current study was done to assess coronary anatomy.   PROCEDURES:  1. Left heart catheterization.  2. Selective coronary arteriography.  3. Selective left ventriculography.   DESCRIPTION OF PROCEDURE:  The procedure was performed from the right  femoral artery using 6 French catheters.  She tolerated the procedure well  and there were no complications.   HEMODYNAMIC DATA:  1. Central aorta 182/69.  2. Left ventricle 127/20.  3. No gradient on pullback across the aortic valve.   ANGIOGRAPHIC DATA:  1. Ventriculography was performed in the RAO projection. There is very mild     mitral regurgitation. Overall, systolic function is well preserved.     Ejection fraction was calculated at 59.4%.  2. There were separate ostia from the left main and left anterior descending     artery.  3. The left anterior descending artery demonstrates about 20% proximal     eccentric narrowing. The vessel then bifurcates into an LAD which is     somewhat small in caliber and has mild luminal irregularity. There was a     fairly large diagonal branch which has about 60 to at most 70% narrowing     in its midportion and is segmental.  4. The circumflex is a large caliber vessel that provides a first marginal     or ramus vessel, a large second marginal, and then courses around through     the AV groove to provide a large third marginal.  Other than minimal     irregularity near the bifurcation of the two large marginal in and AV     circumflex. No high-grade areas of focal stenosis are noted.  5. The right coronary artery is a large dominant vessel. There was perhaps     20% eccentric narrowing proximally.  The distal vessel is without     critical disease.   CONCLUSION:  1. Preserved left ventricular function.  2. Segmental stenosis of the diagonal branch with other findings as noted     above.    DISPOSITION:  Based upon the above findings it would be my recommendation  that we have the patient followup with Dr. Dietrich Pates.  Medical therapy would  be recommended. I am doubtful that the current findings explain the  patient's chest pain.  Arturo Morton. Riley Kill, M.D. [facility]    TDS/MEDQ  D:  08/02/2002  T:  08/04/2002  Job:  [id number]   cc:   CV Laboratory   Scott A. Gerda Diss, M.D.   Gerrit Friends. Dietrich Pates, M.D. [facility]  [address]  [place]  [place] [address]  Fax: 1   Noralyn Pick. Eden Emms, M.D. [facility]

## 2011-04-08 NOTE — Consult Note (Signed)
[name] [name], [name] [name]                        ACCOUNT NO.:  [id number]   MEDICAL RECORD NO.:  [id number]                   PATIENT TYPE:  EMS   LOCATION:  MAJO                                 FACILITY:  [facility]   PHYSICIAN:  Madolyn Frieze. Jens Som, M.D. [facility]         DATE OF BIRTH:  07-31-1924   DATE OF CONSULTATION:  DATE OF DISCHARGE:                                   CONSULTATION   HISTORY OF PRESENT ILLNESS:  The patient is a 77 year old female with recent  evaluation for atypical chest pain.  She underwent cardiac catheterization  on 08/02/02 that showed a normal ejection fracture of 60%, trace mitral  regurgitation, 20% right coronary artery and a 60-70% diagonal.  She also  has a history of diabetes mellitus, hypertension, history of Parkinson's and  TIA.  Since her catheterization on 08/02/02, she has noticed worsening  ecchymosis over her right groin and mild pain.  She did speak with our  practice yesterday and was told to follow the ecchymotic area.  It appeared  to be expanded this morning and she presented to the emergency room for  further evaluation.  She denies any fevers or chills.  There is no increased  dyspnea, chest pain or increase in right lower extremity pain.  There has  been no hemoptysis.  She does state that she has had black stools, but she  was started on iron approximately one week ago.   MEDICATIONS:  SlowFe, vitamin E, Avandia, Avapro, Zoloft and Verapamil,  Lasix and calcium.   PAST MEDICAL HISTORY:  Significant for hypertension and diabetes mellitus.  She has history of questionable Parkinson's as well as history of TIA.  She  is status post hysterectomy, cholecystectomy and right knee surgery.   FAMILY HISTORY:  Significant for diabetes mellitus.  She also has a sister  with Alzheimer's disease.   SOCIAL HISTORY:  He does not smoke or use alcohol.   REVIEW OF SYSTEMS:  Denies any headaches, fever or chills.  There is no  productive cough, hemoptysis,  sputum production, odynophagia, hematochezia.  She has had dark stools related to iron.  There is no frequency or  hematuria.  She denies any orthopnea or PND, and there is no increased pedal  edema.  She has chronic lower extremity pain.  ]   PHYSICAL EXAMINATION:   VITAL SIGNS:  Blood pressure is 154/65, afebrile, pulse 73.   GENERAL APPEARANCE:  She is well-developed, well-nourished, no acute  distress.   SKIN:  Warm and dry.   HEENT:  Unremarkable.   NECK:  Supple.   CHEST:  Clear to auscultation, normal expansion.   CARDIOVASCULAR:  Regular rate and rhythm.   ABDOMEN:  No evidence of tenderness noted.  No masses appreciated.  Right  groin shows a moderate area of ecchymosis over the previous catheterization  site.  There is small hematoma palpated and some tenderness.  There is no  bruit noted.  EXTREMITIES:  Right lower extremity shows no cords.  I can appreciate no  edema.  There is mild tenderness bilaterally in the thighs, but she says it  is a chronic issue.  She has 2+ dorsalis pedis pulses.   NEUROLOGIC:  Grossly intact.   LABORATORY DATA:  Hemoglobin today is 11.1 with hematocrit of 33.2.  This  compares to 11.9 and 39 on 07/30/02.  White blood cell count is 4 with  platelet count of 217, BUN and creatinine of 14 and 1.1.   DIAGNOSES:  1. Status post recent catheterization.  2. Small right groin hematoma.  3. Diabetes mellitus.  4. Hypertension.  5. Parkinson's.  6. History of TIA.   PLAN:  The patient presents for evaluation of increasing size of ecchymosis  and small hematoma on the right groin where she had a previous  catheterization.  There are no bruits on exam and H&H is essentially  unchanged to mildly decreased.  She also has no abdominal or back pain and  her neurovascular exam is intact on her right lower extremity.  We will plan  close observation with followup and groin check on Tuesday as well as a CBC  check at that time.  The patient is  to contact us if her symptoms worsen.                                                Madolyn Frieze Jens Som, M.D. [facility]    BSC/MEDQ  D:  08/04/2002  T:  08/05/2002  Job:  [id number]

## 2011-05-04 ENCOUNTER — Encounter: Payer: Self-pay | Admitting: Cardiology

## 2011-05-04 ENCOUNTER — Encounter (HOSPITAL_COMMUNITY): Payer: Self-pay | Admitting: Oncology

## 2011-05-31 ENCOUNTER — Other Ambulatory Visit: Payer: Self-pay | Admitting: Family Medicine

## 2011-06-01 ENCOUNTER — Encounter: Payer: Self-pay | Admitting: Cardiology

## 2011-06-02 ENCOUNTER — Encounter (HOSPITAL_COMMUNITY): Payer: Medicare Other

## 2011-06-07 ENCOUNTER — Ambulatory Visit (INDEPENDENT_AMBULATORY_CARE_PROVIDER_SITE_OTHER): Payer: Medicare Other | Admitting: Cardiology

## 2011-06-07 ENCOUNTER — Encounter: Payer: Self-pay | Admitting: Cardiology

## 2011-06-07 ENCOUNTER — Other Ambulatory Visit (HOSPITAL_COMMUNITY): Payer: Medicare Other

## 2011-06-07 DIAGNOSIS — K922 Gastrointestinal hemorrhage, unspecified: Secondary | ICD-10-CM

## 2011-06-07 DIAGNOSIS — E785 Hyperlipidemia, unspecified: Secondary | ICD-10-CM

## 2011-06-07 DIAGNOSIS — R296 Repeated falls: Secondary | ICD-10-CM

## 2011-06-07 DIAGNOSIS — K219 Gastro-esophageal reflux disease without esophagitis: Secondary | ICD-10-CM

## 2011-06-07 DIAGNOSIS — D72819 Decreased white blood cell count, unspecified: Secondary | ICD-10-CM | POA: Insufficient documentation

## 2011-06-07 DIAGNOSIS — F0390 Unspecified dementia without behavioral disturbance: Secondary | ICD-10-CM

## 2011-06-07 DIAGNOSIS — H34231 Retinal artery branch occlusion, right eye: Secondary | ICD-10-CM

## 2011-06-07 DIAGNOSIS — M1990 Unspecified osteoarthritis, unspecified site: Secondary | ICD-10-CM

## 2011-06-07 DIAGNOSIS — I10 Essential (primary) hypertension: Secondary | ICD-10-CM

## 2011-06-07 DIAGNOSIS — I2510 Atherosclerotic heart disease of native coronary artery without angina pectoris: Secondary | ICD-10-CM

## 2011-06-07 DIAGNOSIS — E119 Type 2 diabetes mellitus without complications: Secondary | ICD-10-CM | POA: Insufficient documentation

## 2011-06-07 DIAGNOSIS — H34239 Retinal artery branch occlusion, unspecified eye: Secondary | ICD-10-CM

## 2011-06-07 NOTE — Patient Instructions (Signed)
Your physician recommends that you continue on your current medications as directed. Please refer to the Current Medication list given to you today.  Your physician recommends that you schedule a follow-up appointment in: 1 year  

## 2011-06-09 ENCOUNTER — Ambulatory Visit (HOSPITAL_COMMUNITY)
Admission: RE | Admit: 2011-06-09 | Discharge: 2011-06-09 | Disposition: A | Discharge status: Home / Self Care | Payer: Medicare Other | Source: Ambulatory Visit | Attending: Family Medicine | Admitting: Family Medicine

## 2011-06-09 DIAGNOSIS — Z780 Asymptomatic menopausal state: Secondary | ICD-10-CM | POA: Insufficient documentation

## 2011-06-09 DIAGNOSIS — M899 Disorder of bone, unspecified: Secondary | ICD-10-CM | POA: Insufficient documentation

## 2011-06-12 ENCOUNTER — Encounter: Payer: Self-pay | Admitting: Cardiology

## 2011-06-21 ENCOUNTER — Emergency Department (HOSPITAL_COMMUNITY)
Admission: EM | Admit: 2011-06-21 | Discharge: 2011-06-21 | Disposition: A | Discharge status: Skilled Nursing Facility | Payer: Medicare Other | Attending: Emergency Medicine | Admitting: Emergency Medicine

## 2011-06-21 ENCOUNTER — Emergency Department (HOSPITAL_COMMUNITY): Payer: Medicare Other

## 2011-06-21 ENCOUNTER — Encounter (HOSPITAL_COMMUNITY): Payer: Self-pay | Admitting: *Deleted

## 2011-06-21 DIAGNOSIS — W010XXA Fall on same level from slipping, tripping and stumbling without subsequent striking against object, initial encounter: Secondary | ICD-10-CM

## 2011-06-21 DIAGNOSIS — G309 Alzheimer's disease, unspecified: Secondary | ICD-10-CM | POA: Insufficient documentation

## 2011-06-21 DIAGNOSIS — W19XXXA Unspecified fall, initial encounter: Secondary | ICD-10-CM | POA: Insufficient documentation

## 2011-06-21 DIAGNOSIS — R51 Headache: Secondary | ICD-10-CM | POA: Insufficient documentation

## 2011-06-21 DIAGNOSIS — Z043 Encounter for examination and observation following other accident: Secondary | ICD-10-CM | POA: Insufficient documentation

## 2011-06-21 DIAGNOSIS — F0280 Dementia in other diseases classified elsewhere without behavioral disturbance: Secondary | ICD-10-CM | POA: Insufficient documentation

## 2011-06-21 HISTORY — DX: Dementia in other diseases classified elsewhere, unspecified severity, without behavioral disturbance, psychotic disturbance, mood disturbance, and anxiety: F02.80

## 2011-06-21 HISTORY — DX: Alzheimer's disease, unspecified: G30.9

## 2011-06-21 NOTE — ED Notes (Signed)
Pt from [facility], fell pain  Rt knee and rt shoulder. Alert, talking

## 2011-06-21 NOTE — ED Notes (Signed)
Called report to [facility]. Will pick her up app 8 am.

## 2011-06-21 NOTE — ED Notes (Signed)
Does not seem to have any injury from fall. When asked where she hurts she points to her mouth and says her tooth hurts rt mandibular molar .  Has a filling, no  Swelling present.

## 2011-06-21 NOTE — ED Notes (Signed)
Pt fell at [facility].  Awake talking,  MAE. Has old contusion to rt wrist and rt elbow.

## 2011-06-21 NOTE — ED Notes (Signed)
Family at bedside. 

## 2011-06-21 NOTE — ED Provider Notes (Signed)
History     Chief Complaint  Patient presents with  . Fall   Patient is a 77 y.o. female presenting with fall. The history is provided by the nursing home.  Fall The accident occurred 1 to 2 hours ago. The fall occurred while walking. She fell from a height of 1 to 2 ft. She landed on a hard floor. The point of impact was the head. The pain is present in the head. The pain is at a severity of 1/10. Pertinent negatives include no fever, no abdominal pain, no vomiting, no hematuria and no headaches. She has tried nothing for the symptoms.    Past Medical History  Diagnosis Date  . ASCVD (arteriosclerotic cardiovascular disease) 2003    Single vessel disease-60% D1  . Hypertension   . Hyperlipidemia   . Diabetes mellitus     No insulin  . Leukopenia     and anemia-hematology evaluation was nodiagnostic  . Degenerative joint disease     right TKA in 4/06  . Upper GI bleed     gastric stress ulcer in 2003  . Retinal artery branch occlusion of right eye     Sudden onset of right eye blindness-embolism suspected  . Nephrolithiasis   . Gastroesophageal reflux disease with hiatal hernia   . Osteoporosis   . Lung nodule     Left lower lobe  . Parkinson's disease   . Dementia   . Cervical spondylarthritis     S/p discectomy  . Alzheimer disease     Past Surgical History  Procedure Date  . Total abdominal hysterectomy   . Cervical discectomy     Dr. Newell Coral  . Cholecystectomy   . Total knee arthroplasty 2006    Right; Dr. Eulah Pont  . Colonoscopy 2009    Family History  Problem Relation Age of Onset  . Diabetes Sister   . Heart disease Sister   . Diabetes Brother     History  Substance Use Topics  . Smoking status: Never Smoker   . Smokeless tobacco: Never Used  . Alcohol Use: No    OB History    Grav Para Term Preterm Abortions TAB SAB Ect Mult Living                  Review of Systems  Constitutional: Negative for fever and fatigue.  HENT: Negative for  congestion, sinus pressure and ear discharge.   Eyes: Negative for discharge.  Respiratory: Negative for cough.   Cardiovascular: Negative for chest pain.  Gastrointestinal: Negative for vomiting, abdominal pain and diarrhea.  Genitourinary: Negative for frequency and hematuria.  Musculoskeletal: Negative for back pain.  Skin: Negative for rash.  Neurological: Negative for seizures and headaches.  Hematological: Negative.   Psychiatric/Behavioral: Negative for hallucinations.    Physical Exam  BP 170/57  Pulse 72  Temp(Src) 97.9 F (36.6 C) (Oral)  Resp 20  SpO2 96%  Physical Exam  Constitutional: She appears well-developed.  HENT:  Head: Normocephalic and atraumatic.  Eyes: Conjunctivae and EOM are normal. No scleral icterus.  Neck: Neck supple. No thyromegaly present.  Cardiovascular: Normal rate and regular rhythm.  Exam reveals no gallop and no friction rub.   No murmur heard. Pulmonary/Chest: No stridor. She has no wheezes. She has no rales. She exhibits no tenderness.  Abdominal: She exhibits no distension. There is no tenderness (patient is demented from Alzheimer's disease she is alert). There is no rebound.  Musculoskeletal: Normal range of motion. She exhibits no edema.  Lymphadenopathy:    She has no cervical adenopathy.  Neurological: She is alert. Coordination normal.  Skin: No rash noted. No erythema.  Psychiatric: She has a normal mood and affect. Her behavior is normal.    ED Course  Procedures  MDM Fall no injuries.  Results discussed with family      Benny Lennert, MD 06/21/11 [phone]

## 2011-06-21 NOTE — ED Notes (Signed)
Returned from ct 

## 2011-06-22 ENCOUNTER — Inpatient Hospital Stay (HOSPITAL_COMMUNITY)
Admission: EM | Admit: 2011-06-22 | Discharge: 2011-06-27 | DRG: 087 | Disposition: A | Discharge status: Nursing Home (Includes ICF) | Payer: Medicare Other | Attending: Internal Medicine | Admitting: Internal Medicine

## 2011-06-22 ENCOUNTER — Emergency Department (HOSPITAL_COMMUNITY): Payer: Medicare Other

## 2011-06-22 ENCOUNTER — Encounter (HOSPITAL_COMMUNITY): Payer: Self-pay | Admitting: *Deleted

## 2011-06-22 DIAGNOSIS — S06300A Unspecified focal traumatic brain injury without loss of consciousness, initial encounter: Principal | ICD-10-CM | POA: Diagnosis present

## 2011-06-22 DIAGNOSIS — F0280 Dementia in other diseases classified elsewhere without behavioral disturbance: Secondary | ICD-10-CM | POA: Diagnosis present

## 2011-06-22 DIAGNOSIS — E119 Type 2 diabetes mellitus without complications: Secondary | ICD-10-CM | POA: Diagnosis present

## 2011-06-22 DIAGNOSIS — G309 Alzheimer's disease, unspecified: Secondary | ICD-10-CM | POA: Diagnosis present

## 2011-06-22 DIAGNOSIS — I629 Nontraumatic intracranial hemorrhage, unspecified: Secondary | ICD-10-CM | POA: Diagnosis present

## 2011-06-22 DIAGNOSIS — I10 Essential (primary) hypertension: Secondary | ICD-10-CM | POA: Diagnosis present

## 2011-06-22 DIAGNOSIS — F0390 Unspecified dementia without behavioral disturbance: Secondary | ICD-10-CM | POA: Diagnosis present

## 2011-06-22 DIAGNOSIS — W19XXXA Unspecified fall, initial encounter: Secondary | ICD-10-CM | POA: Diagnosis present

## 2011-06-22 DIAGNOSIS — Z66 Do not resuscitate: Secondary | ICD-10-CM | POA: Diagnosis present

## 2011-06-22 DIAGNOSIS — E860 Dehydration: Secondary | ICD-10-CM | POA: Diagnosis present

## 2011-06-22 DIAGNOSIS — S0990XA Unspecified injury of head, initial encounter: Secondary | ICD-10-CM

## 2011-06-22 DIAGNOSIS — R296 Repeated falls: Secondary | ICD-10-CM

## 2011-06-22 LAB — BASIC METABOLIC PANEL
CO2: 22 mEq/L (ref 19–32)
Chloride: 102 mEq/L (ref 96–112)
Potassium: 4.3 mEq/L (ref 3.5–5.1)
Sodium: 138 mEq/L (ref 135–145)

## 2011-06-22 NOTE — ED Notes (Signed)
Pt removed from back board and C-collar removed by Dr. Estell Harpin.

## 2011-06-22 NOTE — ED Notes (Signed)
Pt in xray at this time. Family in the room. No needs voiced at this time.

## 2011-06-22 NOTE — ED Notes (Signed)
Pt brought via ems for c/o Fall at [facility].

## 2011-06-22 NOTE — ED Notes (Signed)
Pt states she does not know why she fell. Complaining of pain in her neck & being sore from falling yesterday.

## 2011-06-23 ENCOUNTER — Encounter (HOSPITAL_COMMUNITY): Payer: Self-pay | Admitting: Internal Medicine

## 2011-06-23 ENCOUNTER — Inpatient Hospital Stay (HOSPITAL_COMMUNITY): Payer: Medicare Other

## 2011-06-23 ENCOUNTER — Other Ambulatory Visit: Payer: Self-pay

## 2011-06-23 DIAGNOSIS — R296 Repeated falls: Secondary | ICD-10-CM

## 2011-06-23 DIAGNOSIS — I629 Nontraumatic intracranial hemorrhage, unspecified: Secondary | ICD-10-CM | POA: Diagnosis present

## 2011-06-23 LAB — COMPREHENSIVE METABOLIC PANEL
ALT: 12 U/L (ref 0–35)
AST: 17 U/L (ref 0–37)
CO2: 22 mEq/L (ref 19–32)
Chloride: 105 mEq/L (ref 96–112)
Creatinine, Ser: 0.94 mg/dL (ref 0.50–1.10)
GFR calc non Af Amer: 57 mL/min — ABNORMAL LOW (ref >60)
Sodium: 140 mEq/L (ref 135–145)
Total Bilirubin: 0.3 mg/dL (ref 0.3–1.2)

## 2011-06-23 LAB — DIFFERENTIAL
Eosinophils Relative: 3 % (ref 0–5)
Lymphocytes Relative: 34 % (ref 12–46)
Lymphs Abs: 2.2 10*3/uL (ref 0.7–4.0)
Monocytes Absolute: 0.8 10*3/uL (ref 0.1–1.0)
Monocytes Relative: 12 % (ref 3–12)

## 2011-06-23 LAB — CK TOTAL AND CKMB (NOT AT ARMC): Total CK: 112 U/L (ref 7–177)

## 2011-06-23 LAB — CBC
HCT: 36.2 % (ref 36.0–46.0)
Hemoglobin: 11.7 g/dL — ABNORMAL LOW (ref 12.0–15.0)
MCH: 32.2 pg (ref 26.0–34.0)
MCHC: 32 g/dL (ref 30.0–36.0)
MCV: 101.4 fL — ABNORMAL HIGH (ref 78.0–100.0)
Platelets: 168 10*3/uL (ref 150–400)
Platelets: 206 10*3/uL (ref 150–400)
RBC: 3.57 MIL/uL — ABNORMAL LOW (ref 3.87–5.11)
WBC: 6.5 10*3/uL (ref 4.0–10.5)

## 2011-06-23 LAB — URINALYSIS, ROUTINE W REFLEX MICROSCOPIC
Glucose, UA: NEGATIVE mg/dL
Ketones, ur: NEGATIVE mg/dL
pH: 6 (ref 5.0–8.0)

## 2011-06-23 LAB — URINE MICROSCOPIC-ADD ON

## 2011-06-23 LAB — GLUCOSE, CAPILLARY
Glucose-Capillary: 154 mg/dL — ABNORMAL HIGH (ref 70–99)
Glucose-Capillary: 189 mg/dL — ABNORMAL HIGH (ref 70–99)
Glucose-Capillary: 110 mg/dL — ABNORMAL HIGH (ref 70–99)

## 2011-06-23 MED ORDER — SODIUM CHLORIDE 0.9 % IJ SOLN
3.0000 mL | Freq: Two times a day (BID) | INTRAMUSCULAR | Status: DC
  Administered 2011-06-23 – 2011-06-24 (×2): 3 mL via INTRAVENOUS
  Filled 2011-06-23: qty 3

## 2011-06-23 MED ORDER — DOCUSATE SODIUM 100 MG PO CAPS
100.0000 mg | ORAL_CAPSULE | Freq: Two times a day (BID) | ORAL | Status: DC
  Administered 2011-06-23 – 2011-06-27 (×9): 100 mg via ORAL
  Filled 2011-06-23 (×9): qty 1

## 2011-06-23 MED ORDER — ALBUTEROL SULFATE (5 MG/ML) 0.5% IN NEBU
2.5000 mg | INHALATION_SOLUTION | RESPIRATORY_TRACT | Status: DC | PRN
  Filled 2011-06-23: qty 3

## 2011-06-23 MED ORDER — PANTOPRAZOLE SODIUM 40 MG PO TBEC
40.0000 mg | DELAYED_RELEASE_TABLET | Freq: Every day | ORAL | Status: DC
  Administered 2011-06-23 – 2011-06-27 (×5): 40 mg via ORAL
  Filled 2011-06-23 (×5): qty 1

## 2011-06-23 MED ORDER — CITALOPRAM HYDROBROMIDE 20 MG PO TABS
10.0000 mg | ORAL_TABLET | Freq: Every day | ORAL | Status: DC
  Administered 2011-06-23: 20 mg via ORAL
  Administered 2011-06-24 – 2011-06-25 (×2): 10 mg via ORAL
  Administered 2011-06-26: 11:00:00 via ORAL
  Administered 2011-06-27: 10 mg via ORAL
  Filled 2011-06-23: qty 1
  Filled 2011-06-23: qty 2
  Filled 2011-06-23 (×3): qty 1

## 2011-06-23 MED ORDER — SIMVASTATIN 20 MG PO TABS
10.0000 mg | ORAL_TABLET | Freq: Every day | ORAL | Status: DC
  Administered 2011-06-24 – 2011-06-26 (×3): 10 mg via ORAL
  Filled 2011-06-23: qty 2
  Filled 2011-06-23 (×2): qty 1

## 2011-06-23 MED ORDER — INSULIN ASPART 100 UNIT/ML ~~LOC~~ SOLN
0.0000 [IU] | Freq: Three times a day (TID) | SUBCUTANEOUS | Status: DC
  Administered 2011-06-23 (×3): 2 [IU] via SUBCUTANEOUS
  Administered 2011-06-25 – 2011-06-26 (×3): 1 [IU] via SUBCUTANEOUS
  Administered 2011-06-26: 2 [IU] via SUBCUTANEOUS
  Administered 2011-06-27: 1 [IU] via SUBCUTANEOUS
  Filled 2011-06-23: qty 3

## 2011-06-23 MED ORDER — FLUTICASONE PROPIONATE 0.05 % EX CREA
1.0000 "application " | TOPICAL_CREAM | Freq: Two times a day (BID) | CUTANEOUS | Status: DC | PRN
  Filled 2011-06-23: qty 15

## 2011-06-23 MED ORDER — MEMANTINE HCL 10 MG PO TABS
10.0000 mg | ORAL_TABLET | Freq: Two times a day (BID) | ORAL | Status: DC
  Administered 2011-06-23 – 2011-06-27 (×9): 10 mg via ORAL
  Filled 2011-06-23 (×9): qty 1

## 2011-06-23 MED ORDER — MOMETASONE FUROATE 0.1 % EX CREA
1.0000 "application " | TOPICAL_CREAM | Freq: Two times a day (BID) | CUTANEOUS | Status: DC | PRN
  Filled 2011-06-23: qty 15

## 2011-06-23 MED ORDER — ACETAMINOPHEN 500 MG PO TABS
500.0000 mg | ORAL_TABLET | Freq: Two times a day (BID) | ORAL | Status: DC
  Administered 2011-06-23 – 2011-06-27 (×9): 500 mg via ORAL
  Filled 2011-06-23 (×6): qty 1

## 2011-06-23 MED ORDER — ATROPINE SULFATE 1 % OP SOLN
1.0000 [drp] | Freq: Three times a day (TID) | OPHTHALMIC | Status: DC
  Administered 2011-06-23 – 2011-06-27 (×13): 1 [drp] via OPHTHALMIC
  Filled 2011-06-23: qty 2

## 2011-06-23 MED ORDER — SENNA 8.6 MG PO TABS: 2.0000 | ORAL_TABLET | Freq: Every day | ORAL | Status: DC | PRN

## 2011-06-23 MED ORDER — ONDANSETRON HCL 4 MG PO TABS: 4.0000 mg | ORAL_TABLET | Freq: Four times a day (QID) | ORAL | Status: DC | PRN

## 2011-06-23 MED ORDER — LOSARTAN POTASSIUM 50 MG PO TABS
50.0000 mg | ORAL_TABLET | Freq: Every day | ORAL | Status: DC
  Administered 2011-06-23 – 2011-06-27 (×5): 50 mg via ORAL
  Filled 2011-06-23 (×5): qty 1

## 2011-06-23 MED ORDER — ONDANSETRON HCL 4 MG/2ML IJ SOLN: 4.0000 mg | Freq: Four times a day (QID) | INTRAMUSCULAR | Status: DC | PRN

## 2011-06-23 MED ORDER — ACETAMINOPHEN 500 MG PO TABS
500.0000 mg | ORAL_TABLET | Freq: Every day | ORAL | Status: DC | PRN
  Administered 2011-06-23: 500 mg via ORAL
  Filled 2011-06-23 (×5): qty 1

## 2011-06-23 MED ORDER — FLEET ENEMA 7-19 GM/118ML RE ENEM: 1.0000 | ENEMA | Freq: Every day | RECTAL | Status: DC | PRN

## 2011-06-23 MED ORDER — ALUM & MAG HYDROXIDE-SIMETH 200-200-20 MG/5ML PO SUSP: 30.0000 mL | Freq: Four times a day (QID) | ORAL | Status: DC | PRN

## 2011-06-23 MED ORDER — BISACODYL 10 MG RE SUPP: 10.0000 mg | Freq: Every day | RECTAL | Status: DC | PRN

## 2011-06-23 MED ORDER — DIFLUPREDNATE 0.05 % OP EMUL
1.0000 [drp] | Freq: Three times a day (TID) | OPHTHALMIC | Status: DC
  Administered 2011-06-23 – 2011-06-27 (×12): 1 [drp] via OPHTHALMIC
  Filled 2011-06-23 (×4): qty 5

## 2011-06-23 MED ORDER — OXYCODONE HCL 5 MG PO TABS
5.0000 mg | ORAL_TABLET | ORAL | Status: DC | PRN
  Administered 2011-06-23 – 2011-06-26 (×3): 5 mg via ORAL
  Filled 2011-06-23 (×3): qty 1

## 2011-06-23 MED ORDER — PROMETHAZINE HCL 12.5 MG PO TABS: 12.5000 mg | ORAL_TABLET | Freq: Four times a day (QID) | ORAL | Status: DC | PRN

## 2011-06-23 MED ORDER — SODIUM CHLORIDE 0.45 % IV SOLN
INTRAVENOUS | Status: AC
  Administered 2011-06-23 (×2): 1000 mL via INTRAVENOUS

## 2011-06-23 MED ORDER — PROMETHAZINE HCL 25 MG/ML IJ SOLN: 12.5000 mg | Freq: Four times a day (QID) | INTRAMUSCULAR | Status: DC | PRN

## 2011-06-23 MED ORDER — HYDRALAZINE HCL 20 MG/ML IJ SOLN: 10.0000 mg | Freq: Four times a day (QID) | INTRAMUSCULAR | Status: DC | PRN

## 2011-06-23 NOTE — ED Provider Notes (Signed)
History     CSN: [id number] Arrival date & time: 06/22/2011  9:42 PM  Chief Complaint  Patient presents with  . Fall    c-collar and LSB in place by [facility]  . Leg Pain   Patient is a 77 y.o. female presenting with fall and leg pain. The history is provided by a relative.  Fall The accident occurred 6 to 12 hours ago (The daughter states the patient fell hit her head to). The fall occurred while walking. She landed on a hard floor. The point of impact was the head. Pertinent negatives include no abdominal pain, no vomiting, no hematuria and no headaches.  Leg Pain     Past Medical History  Diagnosis Date  . ASCVD (arteriosclerotic cardiovascular disease) 2003    Single vessel disease-60% D1  . Hypertension   . Hyperlipidemia   . Diabetes mellitus     No insulin  . Leukopenia     and anemia-hematology evaluation was nodiagnostic  . Degenerative joint disease     right TKA in 4/06  . Upper GI bleed     gastric stress ulcer in 2003  . Retinal artery branch occlusion of right eye     Sudden onset of right eye blindness-embolism suspected  . Nephrolithiasis   . Gastroesophageal reflux disease with hiatal hernia   . Osteoporosis   . Lung nodule     Left lower lobe  . Parkinson's disease   . Dementia   . Cervical spondylarthritis     S/p discectomy  . Alzheimer disease     Past Surgical History  Procedure Date  . Total abdominal hysterectomy   . Cervical discectomy     Dr. Newell Coral  . Cholecystectomy   . Total knee arthroplasty 2006    Right; Dr. Eulah Pont  . Colonoscopy 2009    Family History  Problem Relation Age of Onset  . Diabetes Sister   . Heart disease Sister   . Diabetes Brother     History  Substance Use Topics  . Smoking status: Never Smoker   . Smokeless tobacco: Never Used  . Alcohol Use: No    OB History    Grav Para Term Preterm Abortions TAB SAB Ect Mult Living                  Review of Systems  Constitutional: Negative for fatigue.    HENT: Negative for congestion, sinus pressure and ear discharge.   Eyes: Negative for discharge.  Respiratory: Negative for cough.   Cardiovascular: Negative for chest pain.  Gastrointestinal: Negative for vomiting, abdominal pain and diarrhea.  Genitourinary: Negative for frequency and hematuria.  Musculoskeletal: Negative for back pain.  Skin: Negative for rash.  Neurological: Negative for seizures and headaches.  Hematological: Negative.   Psychiatric/Behavioral: Negative for hallucinations.    Physical Exam  BP 172/65  Pulse 61  Temp(Src) 98.6 F (37 C) (Oral)  Resp 18  SpO2 99%  Physical Exam  Constitutional: She appears well-developed.  HENT:  Head: Normocephalic and atraumatic.  Eyes: Conjunctivae and EOM are normal. No scleral icterus.  Neck: Neck supple. No thyromegaly present.  Cardiovascular: Normal rate and regular rhythm.  Exam reveals no gallop and no friction rub.   No murmur heard. Pulmonary/Chest: No stridor. She has no wheezes. She has no rales. She exhibits no tenderness.  Abdominal: She exhibits no distension. There is no tenderness. There is no rebound.  Musculoskeletal: Normal range of motion. She exhibits no edema.  Lymphadenopathy:  She has no cervical adenopathy.  Neurological: She is alert. Coordination normal.       Pt is confused but this is her normal  Skin: No rash noted. No erythema.  Psychiatric: She has a normal mood and affect. Her behavior is normal.    ED Course  Procedures  MDM I spoke with dr. Alphonzo Lemmings and he stated the pt did not need surgery      Benny Lennert, MD 06/23/11 0151

## 2011-06-23 NOTE — Progress Notes (Signed)
Speech Language/Pathology Clinical/Bedside Swallow Evaluation Patient Details  Name: [name] [name] MRN: [id number] Date of Birth: 1924-09-18  Today's Date: 06/23/2011  Past Medical History:  Past Medical History  Diagnosis Date  . ASCVD (arteriosclerotic cardiovascular disease) 2003    Single vessel disease-60% D1  . Hypertension   . Hyperlipidemia   . Diabetes mellitus     No insulin  . Leukopenia     and anemia-hematology evaluation was nodiagnostic  . Degenerative joint disease     right TKA in 4/06  . Upper GI bleed     gastric stress ulcer in 2003  . Retinal artery branch occlusion of right eye     Sudden onset of right eye blindness-embolism suspected  . Nephrolithiasis   . Gastroesophageal reflux disease with hiatal hernia   . Osteoporosis   . Lung nodule     Left lower lobe  . Parkinson's disease   . Dementia   . Cervical spondylarthritis     S/p discectomy  . Alzheimer disease    Past Surgical History:  Past Surgical History  Procedure Date  . Total abdominal hysterectomy   . Cervical discectomy     Dr. Newell Coral  . Cholecystectomy   . Total knee arthroplasty 2006    Right; Dr. Eulah Pont  . Colonoscopy 2009  . Joint replacement     Prior Functional Status   According to daughters, patient has been consuming regular diet with thin liquids prior to hospitalization.   General     Oral Motor/Sensory Function  Labial ROM: Within Functional Limits Labial Symmetry: Within Functional Limits Labial Strength: Within Functional Limits Labial Sensation: Within Functional Limits Lingual ROM: Within Functional Limits Lingual Symmetry: Within Functional Limits Lingual Strength: Within Functional Limits Lingual Sensation: Within Functional Limits Facial ROM: Within Functional Limits Facial Symmetry: Within Functional Limits Facial Strength: Within Functional Limits Facial Sensation: Within Functional Limits Velum: Within Functional Limits Mandible: Within  Functional Limits  Consistency Results  Ice Chips Ice chips: Not tested  Thin Liquid Thin Liquid: Within functional limits Presentation: Straw Other Comments: Showed no overt s/s of aspiration with thin liquids.  Nectar Thick Liquid Nectar Thick Liquid: Not tested  Honey Thick Liquid Honey Thick Liquid: Not tested  Puree Puree: Not tested  Assessment/Recommendations/Treatment Plan Solid Solid: Within functional limits Presentation: Self Fed Other Comments: No s/s of aspiration with cookies.  Patient consuming snack upon entering room.   Suspected Esophageal Findings Suspected Esophageal Findings:  (Pt occas c/o of pain in sternal area when eat, per daughter)  Aspiration Risk Risk for Aspiration: Mild Other Related Risk Factors: History of GERD;Cognitive impairment  Recommendations Recommended Consults: Consider GI evaluation Solid Consistency: Regular Liquid Consistency: Thin Liquid Administration via: Cup;Straw Medication Administration: Whole meds with liquid Supervision: Patient able to self feed Compensations: Small sips/bites;Follow solids with liquid Postural Changes and/or Swallow Maneuvers: Seated upright 90 degrees;Upright 30-60 min after meal Oral Care Recommendations: Oral care QID  May want to consider GI consult to rule out possible esophageal dysmotility due to intermittent complaints of pain in sternal area when eating (per daughter).  Prognosis Prognosis for Safe Diet Advancement: Good Barriers to Reach Goals: Cognitive deficits  Individuals Consulted Consulted and Agree with Results and Recommendations: Family member/caregiver;RN;Patient Family Member Consulted: Daughters  Swallow Study Goals   N/A  Lenny Pastel 06/23/2011,4:26 PM

## 2011-06-23 NOTE — Progress Notes (Signed)
Subjective: Patient complains of generalized headache. Chest complains of hurting all over. She also has some baseline chronic confusion. According to daughters who are present, this is not appear to be any acute confusion from her baseline. She is not a good historian.  Objective: Weight change:   Intake/Output Summary (Last 24 hours) at 06/23/11 1056 Last data filed at 06/23/11 1000  Gross per 24 hour  Intake 323.33 ml  Output    400 ml  Net -76.67 ml   BP 181/166  Pulse 71  Temp(Src) 97.4 F (36.3 C) (Oral)  Resp 21  Ht 5\' 1"  (1.549 m)  Wt 83.7 kg (184 lb 8.4 oz)  BMI 34.87 kg/m2  SpO2 98% General appearance: alert, cooperative, appears stated age, no distress and slowed mentation Head: Normocephalic, without obvious abnormality, atraumatic Lungs: clear to auscultation bilaterally Heart: regular rate and rhythm, S1, S2 normal, no murmur, click, rub or gallop and Soft 2/6 systolic ejection murmur. Abdomen: soft, non-tender; bowel sounds normal; no masses,  no organomegaly Extremities: extremities normal, atraumatic, no cyanosis or edema and PAS hose. Pulses: 2+ and symmetric Skin: Skin color, texture, turgor normal. No rashes or lesions Neurologic: Symmetrical strength on upper and lower extremities approximately a 4-5.  Lab Results: Basic Metabolic Panel:  Basename 06/23/11 0519 06/22/11 2224  NA 140 138  K 3.9 4.3  CL 105 102  CO2 22 22  GLUCOSE 128* 144*  BUN 27* 31*  CREATININE 0.94 1.30*  CALCIUM 9.0 9.4  MG -- --  PHOS -- --   Liver Function Tests:  [facility] 06/23/11 0519  AST 17  ALT 12  ALKPHOS 34*  BILITOT 0.3  PROT 6.9  ALBUMIN 3.3*   CBC:  Basename 06/23/11 0519 06/23/11 0104  WBC 6.1 6.5  NEUTROABS -- 3.2  HGB 11.2* 11.7*  HCT 35.0* 36.2  MCV 100.6* 101.4*  PLT 206 168   Cardiac Enzymes:  Basename 06/23/11 0513  CKTOTAL 112  CKMB 3.2  CKMBINDEX --  TROPONINI --    CBG:  Basename 06/23/11 0759  GLUCAP 960*454*      Studies/Results: Dg Pelvis 1-2 Views  06/22/2011  *RADIOLOGY REPORT*  Clinical Data: Fall.  Right-sided pain  PELVIS - 1-2 VIEW  Comparison: 06/21/2011  Findings: Negative for fracture.  If the patient has hip pain, dedicated radiographs of the hip may be helpful.  No pelvic fracture is identified.  There is degenerative change of the pubic symphysis.  IMPRESSION: Negative for fracture.  Original Report Authenticated By: Camelia Phenes, M.D.   Dg Pelvis 1-2 Views  06/21/2011  *RADIOLOGY REPORT*  Clinical Data: Fall  PELVIS - 1-2 VIEW  Comparison: 02/26/2010  Findings: Negative for hip fracture.  Negative for pelvic fracture. Degenerative changes in the pubic symphysis.  IMPRESSION: Negative for fracture.  Original Report Authenticated By: Camelia Phenes, M.D.   Dg Ankle Complete Right  06/22/2011  *RADIOLOGY REPORT*  Clinical Data: Fall, pain  RIGHT ANKLE - COMPLETE 3+ VIEW  Comparison: none  Findings: Slight thickening of the distal fibula may be due to old injury.  No acute fracture.  Ankle joint is intact.  No fracture of the tibia.  IMPRESSION: Possible chronic fracture the distal fibula.  No acute fracture.  Original Report Authenticated By: Camelia Phenes, M.D.   Ct Head Wo Contrast  06/22/2011  *RADIOLOGY REPORT*  Clinical Data:  Fall.  CT HEAD WITHOUT CONTRAST CT CERVICAL SPINE WITHOUT CONTRAST  Technique:  Multidetector CT imaging of the head and cervical  spine was performed following the standard protocol without intravenous contrast.  Multiplanar CT image reconstructions of the cervical spine were also generated.  Comparison:  CT 06/21/2011  CT HEAD  Findings: Generalized atrophy of a moderate to advanced degree. Chronic ischemia throughout the white matter, unchanged.  No acute infarct.  Small amount of intraventricular hemorrhage in the right lateral ventricle posteriorly.  This was not present yesterday.  The ventricles are dilated due to atrophy and unchanged.  No subdural hemorrhage.   No mass lesion.  Negative for skull fracture.  Chronic atrophy of the right globe.  IMPRESSION: Atrophy and chronic ischemic change.  Acute intracranial hemorrhage in the right lateral ventricle.  This has developed since yesterday.  CT CERVICAL SPINE  Findings: ACDF C6-7.  2 mm anterior slip C5 on C6.  1 mm anterior slip C4 on C5.  This is unchanged from yesterday.  In addition there is advanced facet degeneration on the right C3-C6.  Negative for fracture.  IMPRESSION: Cervical degenerative changes.  Negative for fracture.  Critical Value/emergent results were called by telephone at the time of interpretation on 06/22/2011  at 2325 hours  to  Dr. Estell Harpin, who verbally acknowledged these results.  Original Report Authenticated By: Camelia Phenes, M.D.   Ct Head Wo Contrast  06/21/2011  *RADIOLOGY REPORT*  Clinical Data:  Fall  CT HEAD WITHOUT CONTRAST CT CERVICAL SPINE WITHOUT CONTRAST  Technique:  Multidetector CT imaging of the head and cervical spine was performed following the standard protocol without intravenous contrast.  Multiplanar CT image reconstructions of the cervical spine were also generated.  Comparison:  MRI 03/19/2004  CT HEAD  Findings: Moderately severe atrophy.  Moderate to advanced chronic ischemic change in the white matter bilaterally.  No acute infarct. Negative for hemorrhage or mass lesion.  Calvarium is intact.  IMPRESSION: Atrophy and chronic ischemia.  No acute abnormality.  CT CERVICAL SPINE  Findings: Anterior plate fusion at C5-6.  2 mm anterior slip C4-C5 and C5 on C6.  Extensive facet degeneration on the right involving C3-4, C4-5, and C5-6.  Mild facet degeneration on the left.  Negative for fracture.  IMPRESSION: Negative for cervical spine fracture.  Original Report Authenticated By: Camelia Phenes, M.D.   Ct Cervical Spine Wo Contrast  06/22/2011  *RADIOLOGY REPORT*  Clinical Data:  Fall.  CT HEAD WITHOUT CONTRAST CT CERVICAL SPINE WITHOUT CONTRAST  Technique:   Multidetector CT imaging of the head and cervical spine was performed following the standard protocol without intravenous contrast.  Multiplanar CT image reconstructions of the cervical spine were also generated.  Comparison:  CT 06/21/2011  CT HEAD  Findings: Generalized atrophy of a moderate to advanced degree. Chronic ischemia throughout the white matter, unchanged.  No acute infarct.  Small amount of intraventricular hemorrhage in the right lateral ventricle posteriorly.  This was not present yesterday.  The ventricles are dilated due to atrophy and unchanged.  No subdural hemorrhage.  No mass lesion.  Negative for skull fracture.  Chronic atrophy of the right globe.  IMPRESSION: Atrophy and chronic ischemic change.  Acute intracranial hemorrhage in the right lateral ventricle.  This has developed since yesterday.  CT CERVICAL SPINE  Findings: ACDF C6-7.  2 mm anterior slip C5 on C6.  1 mm anterior slip C4 on C5.  This is unchanged from yesterday.  In addition there is advanced facet degeneration on the right C3-C6.  Negative for fracture.  IMPRESSION: Cervical degenerative changes.  Negative for fracture.  Critical  Value/emergent results were called by telephone at the time of interpretation on 06/22/2011  at 2325 hours  to  Dr. Estell Harpin, who verbally acknowledged these results.  Original Report Authenticated By: Camelia Phenes, M.D.   Ct Cervical Spine Wo Contrast  06/21/2011  *RADIOLOGY REPORT*  Clinical Data:  Fall  CT HEAD WITHOUT CONTRAST CT CERVICAL SPINE WITHOUT CONTRAST  Technique:  Multidetector CT imaging of the head and cervical spine was performed following the standard protocol without intravenous contrast.  Multiplanar CT image reconstructions of the cervical spine were also generated.  Comparison:  MRI 03/19/2004  CT HEAD  Findings: Moderately severe atrophy.  Moderate to advanced chronic ischemic change in the white matter bilaterally.  No acute infarct. Negative for hemorrhage or mass lesion.   Calvarium is intact.  IMPRESSION: Atrophy and chronic ischemia.  No acute abnormality.  CT CERVICAL SPINE  Findings: Anterior plate fusion at C5-6.  2 mm anterior slip C4-C5 and C5 on C6.  Extensive facet degeneration on the right involving C3-4, C4-5, and C5-6.  Mild facet degeneration on the left.  Negative for fracture.  IMPRESSION: Negative for cervical spine fracture.  Original Report Authenticated By: Camelia Phenes, M.D.   Dg Bone Density  06/09/2011  The Bone Mineral Densitometry report has been sent to the ordering physician.  This report can be viewed through [facility] (Steps: Chart Review>>Imaging>>PACS Images)  or by logging into [employer].  Original Report Authenticated By: Lollie Marrow, M.D.   Medications: Scheduled Meds:   . acetaminophen  500 mg Oral BID  . atropine  1 drop Right Eye TID  . citalopram  10 mg Oral Daily  . Difluprednate  1 drop Right Eye TID  . docusate sodium  100 mg Oral BID  . insulin aspart  0-9 Units Subcutaneous TID WC  . losartan  50 mg Oral Daily  . memantine  10 mg Oral BID  . pantoprazole  40 mg Oral Q1200  . simvastatin  10 mg Oral q1800  . sodium chloride  3 mL Intravenous Q12H   Continuous Infusions:   . sodium chloride 1,000 mL (06/23/11 0232)   PRN Meds:.acetaminophen, albuterol, alum & mag hydroxide-simeth, bisacodyl, fluticasone, hydrALAZINE, mometasone, ondansetron (ZOFRAN) IV, ondansetron, oxyCODONE, promethazine, promethazine, senna, sodium phosphate  Assessment/Plan: Patient Active Hospital Problem List: Intracranial bleed (06/23/2011)   Assessment: Stable. Non-op.    Plan: Check followup MRI. Continue to monitor blood pressure.  We'll discuss with neuro.  HYPERTENSION (01/16/2008)   Assessment: Elevated.    Plan: Discuss with neuro S2 blood pressure management.  Diabetes mellitus: Currently n.p.o. watch CBGs.  Frequent falls (06/23/2011)  Plan: Stable.   LOS: 1 day   Garmon Dehn K 06/23/2011, 10:56 AM

## 2011-06-23 NOTE — ED Notes (Signed)
Attempted to call report & was advised they just got a pt & would return call for report.

## 2011-06-23 NOTE — H&P (Signed)
[name] [name] is an 77 y.o. female.  PCP is Dr. Lilyan Punt  Chief Complaint: Fall, and headache  HPI: This is 77 year old, Caucasian female, who lives in the Alzheimer's unit at [facility]. Patient presented to the ED, originally on July 31 after sustaining a fall. She was evaluated in the ED, underwent a workup, which was unremarkable. She was subsequently sent back to the nursing facility. She came back in on August 1 with another episode of a fall. This time she fell apparently on her head towards the right side. She is complaining of headache and so she was brought back in to the ED. Patient has dementia and so she is unable to provide much history. She currently does not have any pain anywhere. There is no mention of any syncopal episode associated with this fall. According to the daughter, who is at the, bedside She's had about 3 falls since November. She does use a cane to ambulate. No history of fever, chills. No history of nausea, vomiting. No history of chest pain or shortness of breath. No history of focal weakness. No history of seizure disorder.   Prior to Admission medications   Medication Sig Start Date End Date Taking? Authorizing Provider  acetaminophen (TYLENOL) 500 MG tablet Take 500 mg by mouth 2 (two) times daily. Take 2 tabs    Yes Historical Provider, MD  acetaminophen (TYLENOL) 500 MG tablet Take 500 mg by mouth daily as needed. For headache    Yes Historical Provider, MD  aspirin 81 MG tablet Take 81 mg by mouth daily.     Yes Historical Provider, MD  atropine 1 % ophthalmic solution Place 1 drop into the right eye 3 (three) times daily.     Yes Historical Provider, MD  citalopram (CELEXA) 20 MG tablet Take 10 mg by mouth daily. Take 1/2 tab   Yes Historical Provider, MD  Difluprednate (DUREZOL) 0.05 % EMUL Place 1 drop into the right eye 3 (three) times daily.    Yes Historical Provider, MD  docusate sodium ([employer] STOOL SOFTENER) 100 MG capsule Take 100 mg by mouth  2 (two) times daily.     Yes Historical Provider, MD  furosemide (LASIX) 20 MG tablet Take 20 mg by mouth daily.     Yes Historical Provider, MD  ibandronate (BONIVA) 150 MG tablet Take 150 mg by mouth as directed. Take 1 time every three months    Yes Historical Provider, MD  loratadine (CLARITIN) 10 MG tablet Take 10 mg by mouth daily.     Yes Historical Provider, MD  losartan (COZAAR) 50 MG tablet Take 50 mg by mouth daily.     Yes Historical Provider, MD  memantine (NAMENDA) 10 MG tablet Take 10 mg by mouth 2 (two) times daily.     Yes Historical Provider, MD  omeprazole (PRILOSEC) 20 MG capsule Take 20 mg by mouth 2 (two) times daily.     Yes Historical Provider, MD  potassium chloride SA (K-DUR,KLOR-CON) 20 MEQ tablet Take 20 mEq by mouth daily.     Yes Historical Provider, MD  pravastatin (PRAVACHOL) 20 MG tablet Take 10 mg by mouth daily.     Yes Historical Provider, MD  Vitamin B Complex-C CAPS Take by mouth daily.     Yes Historical Provider, MD  vitamin E 400 UNIT capsule Take 400 Units by mouth daily.     Yes Historical Provider, MD  dextrose (GLUTOSE) 40 % GEL Take 1 Tube by mouth once. If  blood sugars greater than 60     Historical Provider, MD  fluticasone (CUTIVATE) 0.05 % cream Apply 1 application topically 2 (two) times daily as needed. Apply to affected face area(S) as needed for irritation     Historical Provider, MD  lisinopril (PRINIVIL,ZESTRIL) 10 MG tablet Take 10 mg by mouth daily.     Historical Provider, MD  mometasone (ELOCON) 0.1 % cream Apply 1 application topically 2 (two) times daily as needed. Apply topically to affected area(S). **DO NOT APPLY TO FACE**     Historical Provider, MD  omeprazole (PRILOSEC) 40 MG capsule Take 20 mg by mouth 2 (two) times daily.     Historical Provider, MD  pravastatin (PRAVACHOL) 10 MG tablet Take 10 mg by mouth daily.      Historical Provider, MD  vitamin E 600 UNIT capsule Take 600 Units by mouth daily.      Historical Provider, MD     Allergies:  Allergies  Allergen Reactions  . Codeine   . Morphine     Past Medical History  Diagnosis Date  . ASCVD (arteriosclerotic cardiovascular disease) 2003    Single vessel disease-60% D1  . Hypertension   . Hyperlipidemia   . Diabetes mellitus     No insulin  . Leukopenia     and anemia-hematology evaluation was nodiagnostic  . Degenerative joint disease     right TKA in 4/06  . Upper GI bleed     gastric stress ulcer in 2003  . Retinal artery branch occlusion of right eye     Sudden onset of right eye blindness-embolism suspected  . Nephrolithiasis   . Gastroesophageal reflux disease with hiatal hernia   . Osteoporosis   . Lung nodule     Left lower lobe  . Parkinson's disease   . Dementia   . Cervical spondylarthritis     S/p discectomy  . Alzheimer disease     Past Surgical History  Procedure Date  . Total abdominal hysterectomy   . Cervical discectomy     Dr. Newell Coral  . Cholecystectomy   . Total knee arthroplasty 2006    Right; Dr. Eulah Pont  . Colonoscopy 2009  . Joint replacement     Social History:  reports that she has never smoked. She has never used smokeless tobacco. She reports that she does not drink alcohol or use illicit drugs.  Family History:  Family History  Problem Relation Age of Onset  . Diabetes Sister   . Heart disease Sister   . Diabetes Brother     Review of Systems  Unable to perform ROS: dementia   Blood pressure 130/91, pulse 72, temperature 98.3 F (36.8 C), temperature source Oral, resp. rate 18, SpO2 95.00%.  Physical Exam  Vitals reviewed. Constitutional: She appears well-developed and well-nourished. No distress.  HENT:  Head: Normocephalic and atraumatic.  Mouth/Throat: Oropharynx is clear and moist. No oropharyngeal exudate.  Eyes: Left eye exhibits no chemosis, no discharge and no exudate. No foreign body present in the left eye. Left conjunctiva is not injected. Left conjunctiva has no hemorrhage.  Left eye exhibits normal extraocular motion and no nystagmus. Right pupil is not round and not reactive. Pupils are unequal.       Right eye is deformed.   Neck: Normal range of motion. Neck supple. No JVD present. No tracheal deviation present. No thyromegaly present.  Cardiovascular: Normal rate and regular rhythm.  Exam reveals no gallop and no friction rub.   Murmur heard. Pulmonary/Chest:  Effort normal and breath sounds normal. No stridor. No respiratory distress. She has no wheezes. She has no rales. She exhibits no tenderness.  Abdominal: Soft. Bowel sounds are normal. She exhibits no distension and no mass. There is no tenderness. There is no rebound and no guarding.  Musculoskeletal: Normal range of motion. She exhibits tenderness. She exhibits no edema.       Pain in left thigh area  Lymphadenopathy:    She has no cervical adenopathy.  Neurological: She is alert. No cranial nerve deficit. She exhibits normal muscle tone.  Skin: Skin is warm and dry. No rash noted. She is not diaphoretic. No erythema.  Psychiatric: She has a normal mood and affect.     Results for orders placed during the hospital encounter of 06/22/11 (from the past 48 hour(s))  BASIC METABOLIC PANEL     Status: Abnormal   Collection Time   06/22/11 10:24 PM      Component Value Range Comment   Sodium 138  135 - 145 (mEq/L)    Potassium 4.3  3.5 - 5.1 (mEq/L)    Chloride 102  96 - 112 (mEq/L)    CO2 22  19 - 32 (mEq/L)    Glucose, Bld 144 (*) 70 - 99 (mg/dL)    BUN 31 (*) 6 - 23 (mg/dL)    Creatinine, Ser 1.61 (*) 0.50 - 1.10 (mg/dL)    Calcium 9.4  8.4 - 10.5 (mg/dL)    GFR calc non Af Amer 39 (*) >60 (mL/min)    GFR calc Af Amer 47 (*) >60 (mL/min)   CBC     Status: Abnormal   Collection Time   06/23/11  1:04 AM      Component Value Range Comment   WBC 6.5  4.0 - 10.5 (K/uL)    RBC 3.57 (*) 3.87 - 5.11 (MIL/uL)    Hemoglobin 11.7 (*) 12.0 - 15.0 (g/dL)    HCT 09.6  04.5 - 40.9 (%)    MCV 101.4 (*) 78.0  - 100.0 (fL)    MCH 32.8  26.0 - 34.0 (pg)    MCHC 32.3  30.0 - 36.0 (g/dL)    RDW 81.1  91.4 - 78.2 (%)    Platelets 168  150 - 400 (K/uL)   DIFFERENTIAL     Status: Normal   Collection Time   06/23/11  1:04 AM      Component Value Range Comment   Neutrophils Relative 50  43 - 77 (%)    Neutro Abs 3.2  1.7 - 7.7 (K/uL)    Lymphocytes Relative 34  12 - 46 (%)    Lymphs Abs 2.2  0.7 - 4.0 (K/uL)    Monocytes Relative 12  3 - 12 (%)    Monocytes Absolute 0.8  0.1 - 1.0 (K/uL)    Eosinophils Relative 3  0 - 5 (%)    Eosinophils Absolute 0.2  0.0 - 0.7 (K/uL)    Basophils Relative 0  0 - 1 (%)    Basophils Absolute 0.0  0.0 - 0.1 (K/uL)    Dg Pelvis 1-2 Views  06/22/2011  *RADIOLOGY REPORT*  Clinical Data: Fall.  Right-sided pain  PELVIS - 1-2 VIEW  Comparison: 06/21/2011  Findings: Negative for fracture.  If the patient has hip pain, dedicated radiographs of the hip may be helpful.  No pelvic fracture is identified.  There is degenerative change of the pubic symphysis.  IMPRESSION: Negative for fracture.  Original Report Authenticated By: DAVID  C. Chestine Spore, M.D.   Dg Pelvis 1-2 Views  06/21/2011  *RADIOLOGY REPORT*  Clinical Data: Fall  PELVIS - 1-2 VIEW  Comparison: 02/26/2010  Findings: Negative for hip fracture.  Negative for pelvic fracture. Degenerative changes in the pubic symphysis.  IMPRESSION: Negative for fracture.  Original Report Authenticated By: Camelia Phenes, M.D.   Dg Ankle Complete Right  06/22/2011  *RADIOLOGY REPORT*  Clinical Data: Fall, pain  RIGHT ANKLE - COMPLETE 3+ VIEW  Comparison: none  Findings: Slight thickening of the distal fibula may be due to old injury.  No acute fracture.  Ankle joint is intact.  No fracture of the tibia.  IMPRESSION: Possible chronic fracture the distal fibula.  No acute fracture.  Original Report Authenticated By: Camelia Phenes, M.D.   Ct Head Wo Contrast  06/22/2011  *RADIOLOGY REPORT*  Clinical Data:  Fall.  CT HEAD WITHOUT CONTRAST CT  CERVICAL SPINE WITHOUT CONTRAST  Technique:  Multidetector CT imaging of the head and cervical spine was performed following the standard protocol without intravenous contrast.  Multiplanar CT image reconstructions of the cervical spine were also generated.  Comparison:  CT 06/21/2011  CT HEAD  Findings: Generalized atrophy of a moderate to advanced degree. Chronic ischemia throughout the white matter, unchanged.  No acute infarct.  Small amount of intraventricular hemorrhage in the right lateral ventricle posteriorly.  This was not present yesterday.  The ventricles are dilated due to atrophy and unchanged.  No subdural hemorrhage.  No mass lesion.  Negative for skull fracture.  Chronic atrophy of the right globe.  IMPRESSION: Atrophy and chronic ischemic change.  Acute intracranial hemorrhage in the right lateral ventricle.  This has developed since yesterday.  CT CERVICAL SPINE  Findings: ACDF C6-7.  2 mm anterior slip C5 on C6.  1 mm anterior slip C4 on C5.  This is unchanged from yesterday.  In addition there is advanced facet degeneration on the right C3-C6.  Negative for fracture.  IMPRESSION: Cervical degenerative changes.  Negative for fracture.  Critical Value/emergent results were called by telephone at the time of interpretation on 06/22/2011  at 2325 hours  to  Dr. Estell Harpin, who verbally acknowledged these results.  Original Report Authenticated By: Camelia Phenes, M.D.   Ct Head Wo Contrast  06/21/2011  *RADIOLOGY REPORT*  Clinical Data:  Fall  CT HEAD WITHOUT CONTRAST CT CERVICAL SPINE WITHOUT CONTRAST  Technique:  Multidetector CT imaging of the head and cervical spine was performed following the standard protocol without intravenous contrast.  Multiplanar CT image reconstructions of the cervical spine were also generated.  Comparison:  MRI 03/19/2004  CT HEAD  Findings: Moderately severe atrophy.  Moderate to advanced chronic ischemic change in the white matter bilaterally.  No acute infarct.  Negative for hemorrhage or mass lesion.  Calvarium is intact.  IMPRESSION: Atrophy and chronic ischemia.  No acute abnormality.  CT CERVICAL SPINE  Findings: Anterior plate fusion at C5-6.  2 mm anterior slip C4-C5 and C5 on C6.  Extensive facet degeneration on the right involving C3-4, C4-5, and C5-6.  Mild facet degeneration on the left.  Negative for fracture.  IMPRESSION: Negative for cervical spine fracture.  Original Report Authenticated By: Camelia Phenes, M.D.   Ct Cervical Spine Wo Contrast  06/22/2011  *RADIOLOGY REPORT*  Clinical Data:  Fall.  CT HEAD WITHOUT CONTRAST CT CERVICAL SPINE WITHOUT CONTRAST  Technique:  Multidetector CT imaging of the head and cervical spine was performed following the standard protocol without  intravenous contrast.  Multiplanar CT image reconstructions of the cervical spine were also generated.  Comparison:  CT 06/21/2011  CT HEAD  Findings: Generalized atrophy of a moderate to advanced degree. Chronic ischemia throughout the white matter, unchanged.  No acute infarct.  Small amount of intraventricular hemorrhage in the right lateral ventricle posteriorly.  This was not present yesterday.  The ventricles are dilated due to atrophy and unchanged.  No subdural hemorrhage.  No mass lesion.  Negative for skull fracture.  Chronic atrophy of the right globe.  IMPRESSION: Atrophy and chronic ischemic change.  Acute intracranial hemorrhage in the right lateral ventricle.  This has developed since yesterday.  CT CERVICAL SPINE  Findings: ACDF C6-7.  2 mm anterior slip C5 on C6.  1 mm anterior slip C4 on C5.  This is unchanged from yesterday.  In addition there is advanced facet degeneration on the right C3-C6.  Negative for fracture.  IMPRESSION: Cervical degenerative changes.  Negative for fracture.  Critical Value/emergent results were called by telephone at the time of interpretation on 06/22/2011  at 2325 hours  to  Dr. Estell Harpin, who verbally acknowledged these results.  Original  Report Authenticated By: Camelia Phenes, M.D.   Ct Cervical Spine Wo Contrast  06/21/2011  *RADIOLOGY REPORT*  Clinical Data:  Fall  CT HEAD WITHOUT CONTRAST CT CERVICAL SPINE WITHOUT CONTRAST  Technique:  Multidetector CT imaging of the head and cervical spine was performed following the standard protocol without intravenous contrast.  Multiplanar CT image reconstructions of the cervical spine were also generated.  Comparison:  MRI 03/19/2004  CT HEAD  Findings: Moderately severe atrophy.  Moderate to advanced chronic ischemic change in the white matter bilaterally.  No acute infarct. Negative for hemorrhage or mass lesion.  Calvarium is intact.  IMPRESSION: Atrophy and chronic ischemia.  No acute abnormality.  CT CERVICAL SPINE  Findings: Anterior plate fusion at C5-6.  2 mm anterior slip C4-C5 and C5 on C6.  Extensive facet degeneration on the right involving C3-4, C4-5, and C5-6.  Mild facet degeneration on the left.  Negative for fracture.  IMPRESSION: Negative for cervical spine fracture.  Original Report Authenticated By: Camelia Phenes, M.D.     Assessment/Plan  Principal Problem:  *Intracranial bleed Active Problems:  HYPERTENSION  Diabetes mellitus  Frequent falls   #1 intracranial bleed: This was discussed by Dr. Estell Harpin the ED physician with Dr. Fabiola Backer, the neurosurgeon. At this time there is no role for any kind of surgical intervention. So, at this, time We'll proceed with admission [facility]. We'll consider consultation with neurology in the morning. MRI of the brain will be obtained as well. Patient be kept in the ICU for now.  #2 hypertension: We will monitor her closely in the intensive care unit and treat her hypertension as needed.  #3 diabetes: She is on diet control. Continue to monitor closely.  #4 frequent falls. We will check a UA with micro-. We will have PT, OT evaluation. It's quite likely that the. Patient may need a walker to ambulate.   #5 patient is a DO NOT  RESUSCITATE.  #6. Mild dehydration: Will give her gentle IV hydration.  #7 pain in the left thigh area: Will check a total CK level. Patient is mentioning of cramping kind of pain. We will monitor this closely.  Further management decisions will depend on results of further testing and patient's response to treatment.  Nateisha Moyd 06/23/2011, 3:16 AM

## 2011-06-24 ENCOUNTER — Inpatient Hospital Stay (HOSPITAL_COMMUNITY): Payer: Medicare Other

## 2011-06-24 DIAGNOSIS — F0390 Unspecified dementia without behavioral disturbance: Secondary | ICD-10-CM | POA: Diagnosis present

## 2011-06-24 LAB — GLUCOSE, CAPILLARY
Glucose-Capillary: 115 mg/dL — ABNORMAL HIGH (ref 70–99)
Glucose-Capillary: 109 mg/dL — ABNORMAL HIGH (ref 70–99)
Glucose-Capillary: 116 mg/dL — ABNORMAL HIGH (ref 70–99)
Glucose-Capillary: 133 mg/dL — ABNORMAL HIGH (ref 70–99)

## 2011-06-24 NOTE — Consult Note (Signed)
[name], [name] NO.:  [id number]  MEDICAL RECORD NO.:  [id number]  LOCATION:  IC09                          FACILITY:  [facility]  PHYSICIAN:  Orena Cavazos A. Gerilyn Pilgrim, M.D. DATE OF BIRTH:  Jan 14, 1924  DATE OF CONSULTATION:  06/22/2011 DATE OF DISCHARGE:                                CONSULTATION   REASON FOR CONSULTATION:  Intraventricular hemorrhage, fall.  HISTORY:  This is an 77 year old white female who has a baseline history Alzheimer dementia.  We actually saw the patient back in the early May 2000s for her symptoms.  She was on Aricept at that time and looks like she is not on this any longer.  She is on Namenda.  The patient presented to the hospital from the [facility] where she stays, with a fall on July 31.  She subsequently presented again today with similar event today.  She also complains of headache.  I did discuss the case with the daughters.  It appears that she ambulates fairly well while she does at [facility].  No falling has been reported when she is up ambulating until the past couple of days.  It appears that on both occasions, she got up in the middle of the night, stood on the edge of the bed and fell out of the bed.  No reported history of seizure disorders, loss of consciousness, or syncope.  No fevers or chills, nausea, vomiting, or focal neurological deficit.  She does not complain of knee pain and pain in general throughout per the daughter.  PAST MEDICAL HISTORY: 1. Alzheimer dementia. 2. Hyperlipidemia. 3. Atherosclerotic cardiovascular disease. 4. Hypertension. 5. Diabetes mellitus. 6. Leukopenia. 7. Degenerative joint disease. 8. Upper GI bleed due to stress ulcer in 2003. 9. Nephrolithiasis. 10.Gastroesophageal reflux disease. 11.Osteoporosis. 12.Parkinson disease. 13.Cervical spondylosis. 14.Retinal artery branch occlusion in the right with resulted     blindness in that eye.  PAST SURGICAL HISTORY:  Total  abdominal hysterectomy, cervical diskectomy, cholecystectomy, total knee replacement on the right and she has had a colonoscopy.  SOCIAL HISTORY:  She stays at [facility].  Never smoked.  No alcohol or illicit drug use.  FAMILY HISTORY:  Significant for diabetes and heart disease.  REVIEW OF SYSTEMS:  As stated in the HPI, limited due to significant cognitive impairment.  PHYSICAL EXAMINATION:  GENERAL:  A mildly overweight lady, in no acute distress. VITAL SIGNS:  Temperature 97.4, pulse 71, respirations 21, blood pressure elevated at 181/161. HEENT:  Right eye has corneal clouding.  Pupils are above 5 mm, nonreactive.  There is also ptosis on the right that is complete, although she can open the eyelid, however. NECK:  Supple. ABDOMEN:  Obese but soft. EXTREMITIES:  Significant osteoarthritic changes of the knees bilaterally, especially on the right.  Mild edema of the ankle. MENTATION.  The patient lies in bed with eyes closed.  She open her eyes to name calling.  She speaks in simple 3-word sentences.  She is disoriented, however, stated she is okay and follows commands well. Speech is somewhat hypophonic.  Cranial nerve evaluation, again right pupil 5 mm fixed.  Corneal clouding, left 4 mm and reactive.  She does have  full extraocular movements.  Visual fields on the left is good. The patient's muscle strength, ptosis on the right although strength seems relatively preserved.  Facial muscle strength throughout is normal.  Tongue is midline.  Shoulder shrug is normal.  Motor examination shows antigravity strength, 3 in the legs and 4 in the upper extremities.  Coordination:  She does seem to have rest tremors, low amplitude, high frequency from time to time.  She does not have significant cogwheeling or bradykinesia, however, rigidity is not observed.  Reflexes are 1+ in the knees, 2 in the upper extremities. Sensation:  Responded to painful stimuli bilaterally.  The  patient's head CT scan was reviewed in person.  She has severe deep white matter and periventricular leukoencephalopathy consistent with chronic ischemic small vessel disease.  There is significant global atrophy.  There is increased hyperdensity seen in the right ventricle posterior horn of the lateral ventricle seen in 2 cuts consistent with intraventricular hemorrhage.  ASSESSMENT: 1. Small interventricular hemorrhage, right lateral ventricle     posterior horn.  This has been discussed with Dr. Franky Macho,     neurosurgeon and nonsurgical treatment is recommended.  We will     continue to follow the patient.  Repeat imaging is suggested.     Actually they are going to get an MRI scan with CT scan.  This is     going to be possibly preferable actually.  We will follow the     patient's examination. 2. Gait impairment.  It seems more episodic when she gets up at night     time.  May be reasonable to have the alert system when she gets     up out of the bed at night time.  She otherwise seemed to ambulate     decently with her cane in the daytime. 3. Advanced dementia, likely combination of vascular and Alzheimer     dementia.  We will continue with Namenda.  Consider restarting     Aricept unless there is contraindication. 4. Mild parkinsonism.  No need for treatment at this time.  Thanks for this consultation.     [name] [name] A. Gerilyn Pilgrim, M.D.     KAD/MEDQ  D:  06/23/2011  T:  06/23/2011  Job:  [id number]

## 2011-06-24 NOTE — Progress Notes (Signed)
Physical Therapy Evaluation Patient Name: [name] [name] Date: 06/24/2011 Problem List:  Patient Active Problem List  Diagnoses  . HYPERLIPIDEMIA  . PARKINSON'S DISEASE  . HYPERTENSION  . NEPHROLITHIASIS  . ASCVD (arteriosclerotic cardiovascular disease)  . Diabetes mellitus  . Leukopenia  . Degenerative joint disease  . Upper GI bleed  . Retinal artery branch occlusion of right eye  . Gastroesophageal reflux disease with hiatal hernia  . Intracranial bleed  . Frequent falls  . Senile dementia   Past Medical History:  Past Medical History  Diagnosis Date  . ASCVD (arteriosclerotic cardiovascular disease) 2003    Single vessel disease-60% D1  . Hypertension   . Hyperlipidemia   . Diabetes mellitus     No insulin  . Leukopenia     and anemia-hematology evaluation was nodiagnostic  . Degenerative joint disease     right TKA in 4/06  . Upper GI bleed     gastric stress ulcer in 2003  . Retinal artery branch occlusion of right eye     Sudden onset of right eye blindness-embolism suspected  . Nephrolithiasis   . Gastroesophageal reflux disease with hiatal hernia   . Osteoporosis   . Lung nodule     Left lower lobe  . Parkinson's disease   . Dementia   . Cervical spondylarthritis     S/p discectomy  . Alzheimer disease    Past Surgical History:  Past Surgical History  Procedure Date  . Total abdominal hysterectomy   . Cervical discectomy     Dr. Newell Coral  . Cholecystectomy   . Total knee arthroplasty 2006    Right; Dr. Eulah Pont  . Colonoscopy 2009  . Joint replacement     Precautions/Restrictions  Precautions Precautions: Fall Required Braces or Orthoses: No Restrictions Weight Bearing Restrictions: No Prior Functioning  Home Living Type of Home: Assisted living Home Adaptive Equipment: Walker - rolling;Straight cane Prior Function Level of Independence: Needs assistance with ADLs;Requires assistive device for independence;Independent with  transfers (assist with bath, independent with dressing) Bath: Minimal Driving: No Vocation: Retired Producer, television/film/video: Awake/alert Overall Cognitive Status: History of cognitive impairments History of Cognitive Impairment: Appears at baseline functioning Orientation Level: Oriented to person (very pleasant and cooperative-poor memory) Sensation/Coordination Sensation Light Touch: Impaired by gross assessment (RLE) Proprioception: Appears Intact Extremity Assessment RUE Assessment RUE Assessment: Within Functional Limits LUE Assessment LUE Assessment: Within Functional Limits RLE Assessment RLE Assessment: Within Functional Limits LLE Assessment LLE Assessment: Within Functional Limits Mobility (including Balance) Bed Mobility Bed Mobility: Yes Supine to Sit: 6: Modified independent (Device/Increase time);HOB elevated (Comment degrees) (30 degrees) Sitting - Scoot to Edge of Bed: 6: Modified independent (Device/Increase time) Transfers Transfers: Yes Sit to Stand: 5: Supervision Sit to Stand Details (indicate cue type and reason): a bit tremulous upon standing, tendency to fall backward but no LOB Stand to Sit: 5: Supervision Ambulation/Gait Ambulation/Gait: Yes Ambulation/Gait Assistance: 4: Min assist Ambulation/Gait Assistance Details (indicate cue type and reason): mild lateral instability to the R Ambulation Distance (Feet): 6 Feet (forward and backward -3 times...limited by ICU lines) Assistive device: Rolling walker;Straight cane (cane will probably be sufficient ultimately) Gait Pattern: Trunk flexed Stairs: No Wheelchair Mobility Wheelchair Mobility: No  Posture/Postural Control Posture/Postural Control: No significant limitations Balance Balance Assessed: Yes Dynamic Sitting Balance Dynamic Sitting - Level of Assistance: 7: Independent Static Standing Balance Static Standing - Balance Support: Right upper extremity supported Static  Standing - Level of Assistance: 5: Stand by assistance Dynamic Standing Balance  Dynamic Standing - Balance Support: Right upper extremity supported Dynamic Standing - Level of Assistance: 5: Stand by assistance Exercise     End of Session PT - End of Session Equipment Utilized During Treatment: Gait belt Activity Tolerance: Patient tolerated treatment well;Patient limited by fatigue Patient left: in chair;with call bell in reach;with family/visitor present Nurse Communication: Mobility status for transfers;Mobility status for ambulation General Behavior During Session: [facility] for tasks performed Cognition: [facility] for tasks performed PT Assessment/Plan/Recommendation PT Assessment Clinical Impression Statement: mild instability and deconditioning probably from recent illness and bedrest-should regain prior functional level with minimal intervention-encourage increased time OOB PT Recommendation/Assessment: Patient will need skilled PT in the acute care venue PT Problem List: Decreased activity tolerance;Decreased mobility;Decreased safety awareness Barriers to Discharge: None PT Therapy Diagnosis : Difficulty walking;Generalized weakness PT Plan PT Frequency: Min 3X/week PT Treatment/Interventions: Gait training;Therapeutic exercise;Functional mobility training;Patient/family education PT Recommendation Follow Up Recommendations: Home health PT (plans to return to [facility]) Equipment Recommended: Defer to next venue PT Goals  Acute Rehab PT Goals PT Goal Formulation: With patient/family Time For Goal Achievement: 7 days Pt will Transfer Sit to Stand/Stand to Sit: with modified independence Pt will Ambulate: 16 - 50 feet;with supervision;with cane Myrlene Broker L 06/24/2011, 11:35 AM

## 2011-06-24 NOTE — Progress Notes (Signed)
Subjective: I did have a discussion with the other disorder electric was seen in the office a few years ago when she was coming to the office. The daughter reports that she had the initial fall getting out of bed at nighttime but the second fall is actually the reverse when she was going to bed. She is actually walking from the bathroom to the bed when she had a fall. Previous to this, she was not having issues with falling.  Objective: Vital signs in last 24 hours: Temp:  [97.4 F (36.3 C)-98.2 F (36.8 C)] 98.2 F (36.8 C) (08/03 0400) Pulse Rate:  [64-74] 68  (08/03 0600) Resp:  [17-31] 20  (08/03 0600) BP: (89-190)/(41-175) 182/69 mmHg (08/03 0600) SpO2:  [93 %-96 %] 94 % (08/03 0600) Weight:  [84.4 kg (186 lb 1.1 oz)] 186 lb 1.1 oz (84.4 kg) (08/03 0500)  Intake/Output from previous day: 08/02 0701 - 08/03 0700 In: 250 [I.V.:250] Out: 1400 [Urine:1400] Intake/Output this shift:   Nutritional status: Carb Control The patient is more responsive and awake today. She speaks in 2 or 3 word sentences and follows commands. She is disoriented however. Next  The patient again is noted to have side of the cornea the right with ptosis. On the left the pupil is 4 mm and reactive. Visual fields are intact. Facial muscle strength is otherwise symmetric and normal other than the right ptosis.  She has at least antigravity strength throughout. Tone and bulk is normal.  There is marked osteophytic changes of the knees.     Lab Results:  Basename 06/23/11 0519 06/23/11 0104 06/22/11 2224  WBC 6.1 6.5 --  HGB 11.2* 11.7* --  HCT 35.0* 36.2 --  PLT 206 168 --  NA 140 -- 138  K 3.9 -- 4.3  CL 105 -- 102  CO2 22 -- 22  GLUCOSE 128* -- 144*  BUN 27* -- 31*  CREATININE 0.94 -- 1.30*  CALCIUM 9.0 -- 9.4  LABA1C -- -- --   Lipid Panel No results found for this basename: CHOL,TRIG,HDL,CHOLHDL,VLDL,LDLCALC in the last 72 hours  Studies/Results: Dg Pelvis 1-2 Views  06/22/2011   *RADIOLOGY REPORT*  Clinical Data: Fall.  Right-sided pain  PELVIS - 1-2 VIEW  Comparison: 06/21/2011  Findings: Negative for fracture.  If the patient has hip pain, dedicated radiographs of the hip may be helpful.  No pelvic fracture is identified.  There is degenerative change of the pubic symphysis.  IMPRESSION: Negative for fracture.  Original Report Authenticated By: Camelia Phenes, M.D.   Dg Ankle Complete Right  06/22/2011  *RADIOLOGY REPORT*  Clinical Data: Fall, pain  RIGHT ANKLE - COMPLETE 3+ VIEW  Comparison: none  Findings: Slight thickening of the distal fibula may be due to old injury.  No acute fracture.  Ankle joint is intact.  No fracture of the tibia.  IMPRESSION: Possible chronic fracture the distal fibula.  No acute fracture.  Original Report Authenticated By: Camelia Phenes, M.D.   Ct Head Wo Contrast  06/22/2011  *RADIOLOGY REPORT*  Clinical Data:  Fall.  CT HEAD WITHOUT CONTRAST CT CERVICAL SPINE WITHOUT CONTRAST  Technique:  Multidetector CT imaging of the head and cervical spine was performed following the standard protocol without intravenous contrast.  Multiplanar CT image reconstructions of the cervical spine were also generated.  Comparison:  CT 06/21/2011  CT HEAD  Findings: Generalized atrophy of a moderate to advanced degree. Chronic ischemia throughout the white matter, unchanged.  No acute infarct.  Small amount of intraventricular hemorrhage in the right lateral ventricle posteriorly.  This was not present yesterday.  The ventricles are dilated due to atrophy and unchanged.  No subdural hemorrhage.  No mass lesion.  Negative for skull fracture.  Chronic atrophy of the right globe.  IMPRESSION: Atrophy and chronic ischemic change.  Acute intracranial hemorrhage in the right lateral ventricle.  This has developed since yesterday.  CT CERVICAL SPINE  Findings: ACDF C6-7.  2 mm anterior slip C5 on C6.  1 mm anterior slip C4 on C5.  This is unchanged from yesterday.  In addition  there is advanced facet degeneration on the right C3-C6.  Negative for fracture.  IMPRESSION: Cervical degenerative changes.  Negative for fracture.  Critical Value/emergent results were called by telephone at the time of interpretation on 06/22/2011  at 2325 hours  to  Dr. Estell Harpin, who verbally acknowledged these results.  Original Report Authenticated By: Camelia Phenes, M.D.   Ct Cervical Spine Wo Contrast  06/22/2011  *RADIOLOGY REPORT*  Clinical Data:  Fall.  CT HEAD WITHOUT CONTRAST CT CERVICAL SPINE WITHOUT CONTRAST  Technique:  Multidetector CT imaging of the head and cervical spine was performed following the standard protocol without intravenous contrast.  Multiplanar CT image reconstructions of the cervical spine were also generated.  Comparison:  CT 06/21/2011  CT HEAD  Findings: Generalized atrophy of a moderate to advanced degree. Chronic ischemia throughout the white matter, unchanged.  No acute infarct.  Small amount of intraventricular hemorrhage in the right lateral ventricle posteriorly.  This was not present yesterday.  The ventricles are dilated due to atrophy and unchanged.  No subdural hemorrhage.  No mass lesion.  Negative for skull fracture.  Chronic atrophy of the right globe.  IMPRESSION: Atrophy and chronic ischemic change.  Acute intracranial hemorrhage in the right lateral ventricle.  This has developed since yesterday.  CT CERVICAL SPINE  Findings: ACDF C6-7.  2 mm anterior slip C5 on C6.  1 mm anterior slip C4 on C5.  This is unchanged from yesterday.  In addition there is advanced facet degeneration on the right C3-C6.  Negative for fracture.  IMPRESSION: Cervical degenerative changes.  Negative for fracture.  Critical Value/emergent results were called by telephone at the time of interpretation on 06/22/2011  at 2325 hours  to  Dr. Estell Harpin, who verbally acknowledged these results.  Original Report Authenticated By: Camelia Phenes, M.D.   Mr Brain Wo Contrast  06/23/2011   *RADIOLOGY REPORT*  Clinical Data: Recent falls with confusion and weakness.  High blood pressure.  Abnormal CT. Dementia.  MRI BRAIN WITHOUT CONTRAST  Technique:  Multiplanar, multiecho pulse sequences of the brain and surrounding structures were obtained according to standard protocol without intravenous contrast.  Comparison: 06/22/2011 CT.  03/19/2004 MR.  Findings: No acute thrombotic infarct.  Small amount of hemorrhage dependent position of the lateral ventricles more notable on the right.  This may be related to the patient's recent fall without intracranial hemorrhage otherwise noted.  Significant confluent white matter type changes extend from the periventricular region to the subcortical region bilaterally. Prominent white matter type changes central pons.  Findings suggestive of result of small vessel disease.  Global atrophy.  The ventricular size has increased from the remote MR scan (most notable involving the temporal horns) raising possibility of mild hydrocephalus however, my overall suspicion is that the ventricular size is related to atrophy which has progressed since prior exam.  No intracranial mass lesion detected on this  unenhanced exam.  Right-sided globe decompression has progressed since prior exam. Retinal detachment once again noted.  The major intracranial vascular structures are patent.  Transverse ligament hypertrophy touches the ventral aspect of the cord without significant compression.  IMPRESSION: No acute thrombotic infarct.  Small amount of hemorrhage within the dependent portion of the lateral ventricles may be related to recent fall.  Significant small vessel disease type changes.  Progressive atrophy and ventricular prominence as discussed above.  Original Report Authenticated By: Fuller Canada, M.D.   Dg Chest Portable 1 View  06/23/2011  *RADIOLOGY REPORT*  Clinical Data: Intracranial bleed, decreased air entry  PORTABLE CHEST - 1 VIEW  Comparison: Portable exam 1330  hours compared to 02/06/2010  Findings: Normal heart size and pulmonary vascularity. Tortuous aorta. Lungs clear. No pleural effusion or pneumothorax. Cardiac monitoring lines project over chest. Bones appear demineralized. Prior cervical spine fusion.  IMPRESSION: No acute abnormalities.  Original Report Authenticated By: Lollie Marrow, M.D.    Medications:  Scheduled Meds:   . acetaminophen  500 mg Oral BID  . atropine  1 drop Right Eye TID  . citalopram  10 mg Oral Daily  . Difluprednate  1 drop Right Eye TID  . docusate sodium  100 mg Oral BID  . insulin aspart  0-9 Units Subcutaneous TID WC  . losartan  50 mg Oral Daily  . memantine  10 mg Oral BID  . pantoprazole  40 mg Oral Q1200  . simvastatin  10 mg Oral q1800  . DISCONTD: sodium chloride  3 mL Intravenous Q12H   Continuous Infusions:   . sodium chloride 1,000 mL (06/23/11 1248)   PRN Meds:.acetaminophen, albuterol, alum & mag hydroxide-simeth, bisacodyl, fluticasone, hydrALAZINE, mometasone, ondansetron (ZOFRAN) IV, ondansetron, oxyCODONE, promethazine, promethazine, senna, sodium phosphate  Assessment/Plan: Intraventricular hemorrhage on change on imaging done today. I suspect that this will likely reabsorb and improve with time. The patient is getting physical therapy. I did advise that she use a walker and 17. 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 There is no chewable neurological cause. She also has marked dementia electric cognition of Alzheimer's dementia and vascular dementia. Continue with Namenda although consider adding Aricept if there is no contraindication.   LOS: 2 days   Sarabella Caprio

## 2011-06-24 NOTE — Progress Notes (Signed)
Subjective: Patient doing better. According to daughter she is much more alert. She is still some problems of baseline confusion. She claims of a very mild headache. Her major complaint is irritation from the blood pressure cuff which seems agitated her more today.  Objective: Weight change: 0.7 kg (1 lb 8.7 oz)  Intake/Output Summary (Last 24 hours) at 06/24/11 1050 Last data filed at 06/24/11 0800  Gross per 24 hour  Intake      0 ml  Output   1200 ml  Net  -1200 ml   BP 173/79  Pulse 72  Temp(Src) 98 F (36.7 C) (Oral)  Resp 17  Ht 5\' 1"  (1.549 m)  Wt 84.4 kg (186 lb 1.1 oz)  BMI 35.16 kg/m2  SpO2 95% General appearance: alert, cooperative, appears stated age, no distress and slowed mentation Head: Normocephalic, without obvious abnormality, atraumatic Lungs: clear to auscultation bilaterally Heart: regular rate and rhythm, S1, S2 normal, no murmur, click, rub or gallop and Soft 2/6 systolic ejection murmur. Abdomen: soft, non-tender; bowel sounds normal; no masses,  no organomegaly Extremities: extremities normal, atraumatic, no cyanosis or edema and PAS hose. Pulses: 2+ and symmetric Skin: Skin color, texture, turgor normal. No rashes or lesions Neurologic: Symmetrical strength on upper and lower extremities approximately a 4-5.  Lab Results: Basic Metabolic Panel: Basic Metabolic Panel:  Basename 06/23/11 0519 06/22/11 2224  NA 140 138  K 3.9 4.3  CL 105 102  CO2 22 22  GLUCOSE 128* 144*  BUN 27* 31*  CREATININE 0.94 1.30*  CALCIUM 9.0 9.4  MG -- --  PHOS -- --   Liver Function Tests:  [facility] 06/23/11 0519  AST 17  ALT 12  ALKPHOS 34*  BILITOT 0.3  PROT 6.9  ALBUMIN 3.3*   CBC:  Basename 06/23/11 0519 06/23/11 0104  WBC 6.1 6.5  NEUTROABS -- 3.2  HGB 11.2* 11.7*  HCT 35.0* 36.2  MCV 100.6* 101.4*  PLT 206 168   Cardiac Enzymes:  Basename 06/23/11 0513  CKTOTAL 112  CKMB 3.2  CKMBINDEX --  TROPONINI --   CBG:  Basename 06/24/11 0719  06/23/11 2151 06/23/11 1622 06/23/11 1140 06/23/11 0759  GLUCAP 115* 110* 189* 112* 154*154*   Hemoglobin A1C:  Basename 06/23/11 0519  HGBA1C 6.7*    Studies/Results: Mr Brain Wo Contrast  06/23/2011  .  IMPRESSION: No acute thrombotic infarct.  Small amount of hemorrhage within the dependent portion of the lateral ventricles may be related to recent fall.  Significant small vessel disease type changes.  Progressive atrophy and ventricular prominence as discussed above.  Original Report Authenticated By: Fuller Canada, M.D.    Medications: Scheduled Meds:    . acetaminophen  500 mg Oral BID  . atropine  1 drop Right Eye TID  . citalopram  10 mg Oral Daily  . Difluprednate  1 drop Right Eye TID  . docusate sodium  100 mg Oral BID  . insulin aspart  0-9 Units Subcutaneous TID WC  . losartan  50 mg Oral Daily  . memantine  10 mg Oral BID  . pantoprazole  40 mg Oral Q1200  . simvastatin  10 mg Oral q1800  . sodium chloride  3 mL Intravenous Q12H   Continuous Infusions:    . sodium chloride 1,000 mL (06/23/11 1248)   PRN Meds:.acetaminophen, albuterol, alum & mag hydroxide-simeth, bisacodyl, fluticasone, hydrALAZINE, mometasone, ondansetron (ZOFRAN) IV, ondansetron, oxyCODONE, promethazine, promethazine, senna, sodium phosphate  Assessment/Plan: Patient Active Hospital Problem List:  Intracranial  bleed (06/23/2011)  MRI noted. Bleed felt to be secondary to fall. Transfer to floor. Followup CT in the morning.   HYPERTENSION (01/16/2008)  BP elevated this morning prior to blood pressure medication being given. Overall improved.  Diabetes mellitus: Stable  Frequent falls (06/23/2011)  Plan: Stable. PT to see.   LOS: 2 days   [name] [name] K 06/24/2011, 10:50 AM

## 2011-06-25 ENCOUNTER — Inpatient Hospital Stay (HOSPITAL_COMMUNITY): Payer: Medicare Other

## 2011-06-25 LAB — GLUCOSE, CAPILLARY
Glucose-Capillary: 133 mg/dL — ABNORMAL HIGH (ref 70–99)
Glucose-Capillary: 106 mg/dL — ABNORMAL HIGH (ref 70–99)

## 2011-06-25 NOTE — Progress Notes (Signed)
Subjective: Patient stable. Daughter notes the patient seems to be complaining of some right shoulder pain. Patient herself otherwise has no complaints. Again she has some chronic baseline dementia.  Objective: Weight change:   Intake/Output Summary (Last 24 hours) at 06/25/11 1258 Last data filed at 06/25/11 0900  Gross per 24 hour  Intake    220 ml  Output    400 ml  Net   -180 ml   BP 150/66  Pulse 77  Temp(Src) 98.4 F (36.9 C) (Oral)  Resp 20  Ht 5\' 1"  (1.549 m)  Wt 84.4 kg (186 lb 1.1 oz)  BMI 35.16 kg/m2  SpO2 96% General appearance: alert, cooperative, appears stated age, no distress and slowed mentation Head: Normocephalic, without obvious abnormality, atraumatic Lungs: clear to auscultation bilaterally Heart: regular rate and rhythm, S1, S2 normal, no murmur, click, rub or gallop and Soft 2/6 systolic ejection murmur. Abdomen: soft, non-tender; bowel sounds normal; no masses,  no organomegaly Extremities: extremities normal, atraumatic, no cyanosis or edema and PAS hose., I was able to get full range of motion with her right arm including abduction abduction and shrugging. She had occasional twinges of pain but otherwise looked to be okay. Pulses: 2+ and symmetric Skin: Skin color, texture, turgor normal. No rashes or lesions Neurologic: Symmetrical strength on upper and lower extremities approximately a 4-5.  Lab Results: Basic Metabolic Panel: Basic Metabolic Panel:  Basename 06/23/11 0519 06/22/11 2224  NA 140 138  K 3.9 4.3  CL 105 102  CO2 22 22  GLUCOSE 128* 144*  BUN 27* 31*  CREATININE 0.94 1.30*  CALCIUM 9.0 9.4  MG -- --  PHOS -- --   Liver Function Tests:  [facility] 06/23/11 0519  AST 17  ALT 12  ALKPHOS 34*  BILITOT 0.3  PROT 6.9  ALBUMIN 3.3*   CBC:  Basename 06/23/11 0519 06/23/11 0104  WBC 6.1 6.5  NEUTROABS -- 3.2  HGB 11.2* 11.7*  HCT 35.0* 36.2  MCV 100.6* 101.4*  PLT 206 168   Cardiac Enzymes:  Basename 06/23/11 0513    CKTOTAL 112  CKMB 3.2  CKMBINDEX --  TROPONINI --   CBG:  Basename 06/25/11 1142 06/25/11 0745 06/24/11 2119 06/24/11 1640 06/24/11 1136 06/24/11 0719  GLUCAP 106* 133* 133* 116* 109* 115*   Hemoglobin A1C:  Basename 06/23/11 0519  HGBA1C 6.7*    Studies/Results: Followup CT notes no increase in blood in the ventricles.   Medications: Scheduled Meds:    . acetaminophen  500 mg Oral BID  . atropine  1 drop Right Eye TID  . citalopram  10 mg Oral Daily  . Difluprednate  1 drop Right Eye TID  . docusate sodium  100 mg Oral BID  . insulin aspart  0-9 Units Subcutaneous TID WC  . losartan  50 mg Oral Daily  . memantine  10 mg Oral BID  . pantoprazole  40 mg Oral Q1200  . simvastatin  10 mg Oral q1800  . DISCONTD: sodium chloride  3 mL Intravenous Q12H   Continuous Infusions:   PRN Meds:.acetaminophen, albuterol, alum & mag hydroxide-simeth, bisacodyl, fluticasone, hydrALAZINE, mometasone, ondansetron (ZOFRAN) IV, ondansetron, oxyCODONE, promethazine, promethazine, senna, sodium phosphate  Assessment/Plan: Patient Active Hospital Problem List:  Intracranial bleed (06/23/2011)  stable. Blood likely resorb over several weeks. For skilled nursing facility on Monday.  HYPERTENSION (01/16/2008)  BP elevated this morning prior to blood pressure medication being given. Overall improved. Continue to monitor.  Diabetes mellitus: Stable  Frequent falls (  06/23/2011)  Plan: Stable. PT to see.   LOS: 3 days   Deepti Gunawan K 06/25/2011, 12:58 PM

## 2011-06-26 LAB — GLUCOSE, CAPILLARY
Glucose-Capillary: 172 mg/dL — ABNORMAL HIGH (ref 70–99)
Glucose-Capillary: 107 mg/dL — ABNORMAL HIGH (ref 70–99)

## 2011-06-26 MED ORDER — ASPIRIN 81 MG PO CHEW: 81.0000 mg | CHEWABLE_TABLET | Freq: Every day | ORAL | Status: DC

## 2011-06-26 NOTE — Progress Notes (Signed)
Subjective: Feels tired. Complains of some leg pain where she has a small lip ulcer has been present since admission.. Again she has some chronic baseline dementia.  Objective: Weight change:   Intake/Output Summary (Last 24 hours) at 06/26/11 1325 Last data filed at 06/26/11 0900  Gross per 24 hour  Intake    580 ml  Output    700 ml  Net   -120 ml   BP 156/79  Pulse 66  Temp(Src) 97.6 F (36.4 C) (Oral)  Resp 20  Ht 5\' 1"  (1.549 m)  Wt 84.4 kg (186 lb 1.1 oz)  BMI 35.16 kg/m2  SpO2 94% General appearance: alert, cooperative, appears stated age, no distress and slowed mentation Head: Normocephalic, without obvious abnormality, atraumatic On inside her bottom lip she has a small red area that appears to be tender. Lungs: clear to auscultation bilaterally Heart: regular rate and rhythm, S1, S2 normal, no murmur, click, rub or gallop and Soft 2/6 systolic ejection murmur. Abdomen: soft, non-tender; bowel sounds normal; no masses,  no organomegaly Extremities: extremities normal, atraumatic, no cyanosis or edema and PAS hose., I was able to get full range of motion with her right arm including abduction abduction and shrugging. She had occasional twinges of pain but otherwise looked to be okay. Pulses: 2+ and symmetric Skin: Skin color, texture, turgor normal. No rashes or lesions Neurologic: Symmetrical strength on upper and lower extremities approximately a 4-5. CBG:  Basename 06/26/11 1129 06/26/11 0813 06/25/11 2100 06/25/11 1654 06/25/11 1142 06/25/11 0745  GLUCAP 104* 124* 187* 137* 106* 133*    Medications: Scheduled Meds:    . acetaminophen  500 mg Oral BID  . atropine  1 drop Right Eye TID  . citalopram  10 mg Oral Daily  . Difluprednate  1 drop Right Eye TID  . docusate sodium  100 mg Oral BID  . insulin aspart  0-9 Units Subcutaneous TID WC  . losartan  50 mg Oral Daily  . memantine  10 mg Oral BID  . pantoprazole  40 mg Oral Q1200  . simvastatin  10 mg Oral  q1800   Continuous Infusions:   PRN Meds:.acetaminophen, albuterol, alum & mag hydroxide-simeth, bisacodyl, fluticasone, hydrALAZINE, mometasone, ondansetron (ZOFRAN) IV, ondansetron, oxyCODONE, promethazine, promethazine, senna, sodium phosphate  Assessment/Plan: Patient Active Hospital Problem List:  Intracranial bleed (06/23/2011)  stable. Blood likely resorb over several weeks. For skilled nursing facility on Monday.  HYPERTENSION (01/16/2008) Overall improved. Continue to monitor.  Diabetes mellitus: Stable  Frequent falls (06/23/2011)  PT-OT at skilled nursing facility   LOS: 4 days   KRISHNAN,SENDIL K 06/26/2011, 1:25 PM

## 2011-06-27 LAB — GLUCOSE, CAPILLARY
Glucose-Capillary: 118 mg/dL — ABNORMAL HIGH (ref 70–99)
Glucose-Capillary: 122 mg/dL — ABNORMAL HIGH (ref 70–99)

## 2011-06-27 MED ORDER — ASPIRIN 81 MG PO TABS: ORAL_TABLET | ORAL | Status: DC

## 2011-06-27 MED ORDER — GLUCOSE 40 % PO GEL: 1.0000 | Freq: Once | ORAL | Status: DC

## 2011-06-27 NOTE — Progress Notes (Signed)
Subjective: No new complaints.   Objective: Vital signs in last 24 hours: Temp:  [98.1 F (36.7 C)-98.3 F (36.8 C)] 98.3 F (36.8 C) (08/06 0600) Pulse Rate:  [69-79] 69  (08/06 0600) Resp:  [18-20] 20  (08/06 0600) BP: (131-152)/(62-80) 136/74 mmHg (08/06 0600) SpO2:  [93 %-97 %] 95 % (08/06 0951)  Intake/Output from previous day: 08/05 0701 - 08/06 0700 In: 660 [P.O.:660] Out: 575 [Urine:575] Intake/Output this shift: I/O this shift: In: 240 [P.O.:240] Out: -  Nutritional status: Carb Control The patient is more responsive and awake today. She speaks in 2 or 3 word sentences and follows commands. She is disoriented however. Next  The patient again is noted to have side of the cornea the right with ptosis. On the left the pupil is 4 mm and reactive. Visual fields are intact. Facial muscle strength is otherwise symmetric and normal other than the right ptosis.  She has at least antigravity strength throughout. Tone and bulk is normal.  There is marked osteophytic changes of the knees.     Lab Results: No results found for this basename: WBC:2,HGB:2,HCT:2,PLT:2,NA:2,K:2,CL:2,CO2:2,GLUCOSE:2,BUN:2,CREATININE:2,CALCIUM:2,LABA1C in the last 72 hours Lipid Panel No results found for this basename: CHOL,TRIG,HDL,CHOLHDL,VLDL,LDLCALC in the last 72 hours  Studies/Results: No results found.  Medications:  Scheduled Meds:    . acetaminophen  500 mg Oral BID  . atropine  1 drop Right Eye TID  . citalopram  10 mg Oral Daily  . Difluprednate  1 drop Right Eye TID  . docusate sodium  100 mg Oral BID  . insulin aspart  0-9 Units Subcutaneous TID WC  . losartan  50 mg Oral Daily  . memantine  10 mg Oral BID  . pantoprazole  40 mg Oral Q1200  . simvastatin  10 mg Oral q1800  . DISCONTD: aspirin  81 mg Oral Daily   Continuous Infusions:  PRN Meds:.acetaminophen, alum & mag hydroxide-simeth, bisacodyl, fluticasone, hydrALAZINE, mometasone, ondansetron (ZOFRAN) IV,  ondansetron, oxyCODONE, promethazine, promethazine, senna, sodium phosphate, DISCONTD: albuterol  Assessment/Plan: Intraventricular hemorrhage. Stable. Restart aspirin 1 month from now.   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 There is no treatable neurological cause. She also has marked dementia - Alzheimer's dementia and vascular dementia. Continue with Namenda although consider adding Aricept if there is no contraindication.   LOS: 5 days   [name] [name]

## 2011-06-27 NOTE — Progress Notes (Signed)
Patient to be discharged to [facility]

## 2011-06-27 NOTE — Consult Note (Signed)
Pt d/c today by MD back to [facility] with home health PT.  Pt, pt's daughter, and facility aware and agreeable.  Pt to transfer via facility van.  Jean Rosenthal, RN, reviewed [facility] for accuracy.    Karn Cassis

## 2011-06-27 NOTE — Discharge Summary (Signed)
Discharge Summary  [name] [name]  MR#: [id number]  DOB:01-19-1924  Date of Admission: 06/22/2011 Date of Discharge: 06/27/2011  Attending Physician:KRISHNAN,SENDIL K  Patient's PCP: Lilyan Punt, MD, MD  Consults: Treatment Team:  Beryle Beams, MD, Neuro  Discharge Diagnoses:  .Intracranial bleed .HYPERTENSION .Diabetes mellitus .Senile dementia   Discharge Medications Current Discharge Medication List    CONTINUE these medications which have CHANGED   Details  aspirin 81 MG tablet Stop this medicine for one month. Qty: 30 tablet, Refills: 0      CONTINUE these medications which have NOT CHANGED   Details  !! acetaminophen (TYLENOL) 500 MG tablet Take 500 mg by mouth 2 (two) times daily. Take 2 tabs     !! acetaminophen (TYLENOL) 500 MG tablet Take 500 mg by mouth daily as needed. For headache     atropine 1 % ophthalmic solution Place 1 drop into the right eye 3 (three) times daily.      citalopram (CELEXA) 20 MG tablet Take 10 mg by mouth daily. Take 1/2 tab    Difluprednate (DUREZOL) 0.05 % EMUL Place 1 drop into the right eye 3 (three) times daily.     docusate sodium ([employer] STOOL SOFTENER) 100 MG capsule Take 100 mg by mouth 2 (two) times daily.      furosemide (LASIX) 20 MG tablet Take 20 mg by mouth daily.      ibandronate (BONIVA) 150 MG tablet Take 150 mg by mouth as directed. Take 1 time every three months     loratadine (CLARITIN) 10 MG tablet Take 10 mg by mouth daily.      losartan (COZAAR) 50 MG tablet Take 50 mg by mouth daily.      memantine (NAMENDA) 10 MG tablet Take 10 mg by mouth 2 (two) times daily.      omeprazole (PRILOSEC) 20 MG capsule Take 20 mg by mouth 2 (two) times daily.      pravastatin (PRAVACHOL) 20 MG tablet Take 10 mg by mouth daily.      Vitamin B Complex-C CAPS Take by mouth daily.      !! vitamin E 400 UNIT capsule Take 400 Units by mouth daily.      dextrose (GLUTOSE) 40 % GEL Take 1 Tube by mouth once. If blood  sugars less than 60     fluticasone (CUTIVATE) 0.05 % cream Apply 1 application topically 2 (two) times daily as needed. Apply to affected face area(S) as needed for irritation     lisinopril (PRINIVIL,ZESTRIL) 10 MG tablet Take 10 mg by mouth daily.     mometasone (ELOCON) 0.1 % cream Apply 1 application topically 2 (two) times daily as needed. Apply topically to affected area(S). **DO NOT APPLY TO FACE**     !! vitamin E 600 UNIT capsule Take 600 Units by mouth daily.                        Hospital Course: Intracranial bleed: Patient is a 77 year old white female with past medical history of diabetes hypertension and mild dementia who was brought into the emergency room after a fall and was complaining of a headache. According to family she had a few falls in the past few weeks. There was no loss of consciousness and she had some mild increased delirium and that she was more withdrawn than her baseline.  CT scan of the head was done in the emergency room which noted no evidence of acute infarct, but a small  amount of intraventricular hemorrhage in the right lateral ventricule posteriorly. This is an acute finding and the patient was admitted to the hospital service. She initially was placed in the ICU and monitored. Dr.[place]oonquah from neurology was consulted. He felt the bleeding to be stable and likely related to her fall. He recommended MRI which was done noting stable bleed and likely from fall itself rather than a hemorrhagic CVA. Patient's aspirin was discontinued and measures to keep her blood pressure under control were done. By hospital day 2 patient was much more alert and interactive. Her complaint during this hospitalization has been headache which seems to improved over the course of the past few days. She was evaluated by PT who recommended PT OT at the skilled nursing facility she is at.  Patient with discharged today 06/27/2011. Her aspirin will be held for the next one month.   Followup CT done on hospital day 3 confirmed stable blood in ventricle with no increase in size. It should continue to resorb over the next few weeks  .HYPERTENSION: Patient's blood pressure was kept relatively stable. When she cleared from a swelling standpoint her medications were resumed and initially she required when necessary hydralazine IV but she should get to the point where blood pressure was well-controlled.   .Diabetes mellitus: Patient was put on sliding scale. Her CBGs are stable.  .Senile dementia: Stable. After her first day the patient became more interactive. She has some chronic baseline confusion. Her family feels that she is at her baseline.  Day of Discharge BP 136/74  Pulse 69  Temp(Src) 98.3 F (36.8 C) (Oral)  Resp 20  Ht 5\' 1"  (1.549 m)  Wt 84.4 kg (186 lb 1.1 oz)  BMI 35.16 kg/m2  SpO2 95%  Physical Exam: General appearance: alert, cooperative, appears stated age, no distress and slowed mentation  Head: Normocephalic, without obvious abnormality, atraumatic  On inside her bottom lip she has a small red area that appears to be tender.  Lungs: clear to auscultation bilaterally  Heart: regular rate and rhythm, S1, S2 normal, no murmur, click, rub or gallop and Soft 2/6 systolic ejection murmur.  Abdomen: soft, non-tender; bowel sounds normal; no masses, no organomegaly  Extremities: extremities normal, atraumatic, no cyanosis or edema and PAS hose., I was able to get full range of motion with her right arm including abduction abduction and shrugging. She had occasional twinges of pain but otherwise looked to be okay.  Pulses: 2+ and symmetric  Skin: Skin color, texture, turgor normal. No rashes or lesions  Neurologic: Symmetrical strength on upper and lower extremities approximately a 4-5.   Disposition: Improved  Follow-up Appts: Discharge Orders    Future Orders Please Complete By Expires   Diet - low sodium heart healthy      Increase activity slowly       Walk with assistance      Scheduling Instructions:   With physical therapy.     patient will see her PCP, Dr.Luking in the next 3-4 weeks.   SignedHollice Espy 06/27/2011, 2:21 PM

## 2011-07-07 NOTE — Assessment & Plan Note (Signed)
No symptoms to suggest active myocardial ischemia.

## 2011-07-07 NOTE — Assessment & Plan Note (Signed)
None recently 

## 2011-07-07 NOTE — Assessment & Plan Note (Signed)
BP control good with current meds, which will be continued.

## 2011-07-07 NOTE — Assessment & Plan Note (Signed)
Most recent lipid values extremely low on low dose pravastatin, which will be continued.

## 2011-07-07 NOTE — Progress Notes (Signed)
HPI : [name] returns to the office as scheduled for continuing assessment and treatment of CV risk factors.  She has regained some weight after a substantial loss.  Diabetes and hypertension remain under good control.  Recent records and lab results obtained from PCP.  Current Outpatient Prescriptions on File Prior to Visit  Medication Sig Dispense Refill  . acetaminophen (TYLENOL) 500 MG tablet Take 500 mg by mouth 2 (two) times daily. Take 2 tabs       . citalopram (CELEXA) 20 MG tablet Take 10 mg by mouth daily. Take 1/2 tab      . docusate sodium ([employer]S STOOL SOFTENER) 100 MG capsule Take 100 mg by mouth 2 (two) times daily.        Marland Kitchen lisinopril (PRINIVIL,ZESTRIL) 10 MG tablet Take 10 mg by mouth daily.       . Vitamin B Complex-C CAPS Take by mouth daily.        . vitamin E 600 UNIT capsule Take 600 Units by mouth daily.           Allergies  Allergen Reactions  . Codeine   . Morphine       Past medical history, social history, and family history reviewed and updated.  ROS: Denies orthopnea, PND, dyspnea, chest pain cough, sputum, abdominal pain.  PHYSICAL EXAM: BP 140/67  Pulse 89  Ht 5\' 1"  (1.549 m)  Wt 81.647 kg (180 lb)  BMI 34.01 kg/m2  SpO2 96%13 lbs increased since 04/2010  General-Well developed; no acute distress Body habitus-moderately overweight Neck-No JVD; no carotid bruits Lungs-clear lung fields; resonant to percussion Cardiovascular-normal PMI; normal S1 and S2; G2/6 SEM Abdomen-normal bowel sounds; soft and non-tender without masses or organomegaly Musculoskeletal-No deformities, no cyanosis or clubbing Neurologic-Normal cranial nerves; symmetric strength and tone Skin-Warm, no significant lesions Extremities-distal pulses intact; no edema  ASSESSMENT AND PLAN:

## 2011-07-07 NOTE — Assessment & Plan Note (Signed)
Pt. Is less spontaneous, less talkative and less engaged than in the past, but functional status is still good and progression of disease slow.

## 2011-07-21 NOTE — Progress Notes (Signed)
Encounter addended by: Clarene Critchley on: 07/21/2011  8:23 AM<BR>     Documentation filed: Flowsheet VN

## 2011-07-28 ENCOUNTER — Other Ambulatory Visit: Payer: Self-pay | Admitting: Family Medicine

## 2011-08-01 ENCOUNTER — Other Ambulatory Visit (HOSPITAL_COMMUNITY): Payer: Medicare Other

## 2011-08-08 ENCOUNTER — Inpatient Hospital Stay (HOSPITAL_COMMUNITY)
Admission: RE | Admit: 2011-08-08 | Discharge: 2011-08-08 | Payer: Medicare Other | Source: Ambulatory Visit | Attending: Family Medicine | Admitting: Family Medicine

## 2011-08-08 ENCOUNTER — Ambulatory Visit (HOSPITAL_COMMUNITY)
Admission: RE | Admit: 2011-08-08 | Discharge: 2011-08-08 | Disposition: A | Discharge status: Home / Self Care | Payer: Medicare Other | Source: Ambulatory Visit | Attending: Family Medicine | Admitting: Family Medicine

## 2011-08-08 DIAGNOSIS — I10 Essential (primary) hypertension: Secondary | ICD-10-CM | POA: Insufficient documentation

## 2011-08-08 DIAGNOSIS — R1310 Dysphagia, unspecified: Secondary | ICD-10-CM | POA: Insufficient documentation

## 2011-08-08 NOTE — Procedures (Signed)
Modified Barium Swallow Procedure Note Patient Details  Name: [name] [name] MRN: [id number] Date of Birth: 1924/11/13  Today's Date: 08/08/2011 Time: 1310-1340 Time Calculation (min): 30 min  Past Medical History:  Past Medical History  Diagnosis Date  . ASCVD (arteriosclerotic cardiovascular disease) 2003    Single vessel disease-60% D1  . Hypertension   . Hyperlipidemia   . Diabetes mellitus     No insulin  . Leukopenia     and anemia-hematology evaluation was nodiagnostic  . Degenerative joint disease     right TKA in 4/06  . Upper GI bleed     gastric stress ulcer in 2003  . Retinal artery branch occlusion of right eye     Sudden onset of right eye blindness-embolism suspected  . Nephrolithiasis   . Gastroesophageal reflux disease with hiatal hernia   . Osteoporosis   . Lung nodule     Left lower lobe  . Parkinson's disease   . Dementia   . Cervical spondylarthritis     S/p discectomy  . Alzheimer disease    Past Surgical History:  Past Surgical History  Procedure Date  . Total abdominal hysterectomy   . Cervical discectomy     Dr. Newell Coral  . Cholecystectomy   . Total knee arthroplasty 2006    Right; Dr. Eulah Pont  . Colonoscopy 2009  . Joint replacement    HPI:  Symptoms/Limitations Symptoms: "She coughs after she eats a lot." (patient's daughter) Special Tests: MBSS  General:  Date of Onset: 07/21/11 Other Pertinent Information: Mrs. Headlee is a resident at the [facility] and was referred by Dr. Gerda Diss for MBS secondary to reports of patient coughing with thin liquids. Her daughter accompanied her to the evaluation and reports that this has been going on for some time. Her daughter is also concerned that this may be due to her Alzheimers. She has GERD and history of a hiatal hernia. Type of Study: Initial MBS Diet Prior to this Study: Regular;Thin liquids Temperature Spikes Noted: No Respiratory Status: Room air History of Intubation:  No Behavior/Cognition: Alert;Cooperative;Pleasant mood Oral Cavity - Dentition: Adequate natural dentition Oral Motor / Sensory Function: Within functional limits (eye droop right) Vision: Functional for self-feeding Patient Positioning: Upright in chair Baseline Vocal Quality: Normal Volitional Cough: Strong Volitional Swallow: Able to elicit Anatomy: Within functional limits Pharyngeal Secretions: Not observed secondary MBS  Reason for Referral:  Objectively evaluate swallow function due to concerns of possible aspiration.  Oral Phase Oral Preparation/Oral Phase Oral Phase: WFL Pharyngeal Phase  Pharyngeal Phase Pharyngeal Phase: Within functional limits Cervical Esophageal Phase  Cervical Esophageal Phase Cervical Esophageal Phase: [facility]  Recommendation/Prognosis  Clinical Impression Dysphagia Diagnosis: Within Functional Limits Clinical impression: Overall oropharyngeal swallow is WFL with no penetration or aspiration observed with puree, thin liquids with and without straw, thin with pill, and regular textures. Recommendations Recommended Consults: Consider GI evaluation;Other (Comment) (Esophageal sweep was unremarkable except for hiatal hernia ) Solid Consistency: Regular Liquid Consistency: Thin Liquid Administration via: Cup;Straw Medication Administration: Whole meds with liquid Supervision: Patient able to self feed (intermittent supervision) Postural Changes and/or Swallow Maneuvers: Seated upright 90 degrees;Out of bed for meals;Upright 30-60 min after meal Oral Care Recommendations: Oral care BID Other Recommendations: Clarify dietary restrictions   Individuals Consulted Consulted and Agree with Results and Recommendations: Family member/caregiver;Patient Family Member Consulted: Daughter Report Sent to : Referring physician   SLP Assessment/Plan: Oropharyngeal swallow WFL. Patient's daughter has concerns about reflux. Esophageal sweep was unremarkable,  although Dr.  Tyron Russell did identify a hiatal hernia.     Havery Moros, CCC-SLP [phone]  Saraiyah Hemminger 08/08/2011, 2:18 PM

## 2011-08-23 LAB — GLUCOSE, CAPILLARY: Glucose-Capillary: 111 — ABNORMAL HIGH

## 2011-09-13 ENCOUNTER — Encounter (INDEPENDENT_AMBULATORY_CARE_PROVIDER_SITE_OTHER): Payer: Self-pay | Admitting: Internal Medicine

## 2011-09-13 ENCOUNTER — Ambulatory Visit (INDEPENDENT_AMBULATORY_CARE_PROVIDER_SITE_OTHER): Payer: Medicare Other | Admitting: Internal Medicine

## 2011-09-13 VITALS — BP 128/58 | HR 66 | Temp 98.5°F | Ht 61.0 in | Wt 183.6 lb

## 2011-09-13 DIAGNOSIS — K219 Gastro-esophageal reflux disease without esophagitis: Secondary | ICD-10-CM

## 2011-09-13 DIAGNOSIS — E785 Hyperlipidemia, unspecified: Secondary | ICD-10-CM

## 2011-09-13 MED ORDER — SUCRALFATE 1 GM/10ML PO SUSP: 1.0000 g | Freq: Four times a day (QID) | ORAL | Status: DC

## 2011-09-13 NOTE — Progress Notes (Signed)
Subjective:     Patient ID: [name] [name], female   DOB: September 10, 1924, 77 y.o.   MRN: [id number]  HPI [name] [name] is an 77 yr old female patient residing at the [facility].  Her daughter is present in the room.  She c/o that her throat hurts. Her appetite is good.  No recent weight loss. She is on omeprazole 40mg  BID dosing.  Her swallowing is better. She is not c/o as much. Her daughter purchased a hospital bed with a raised head which has helped. She is on a low carb diet.  There is no coughing when she is eating.  She has seen an ENT a few yrs ago and her daughter says they could see her reflux.   Her daughter also tells me that in the morning she is wheezing. BMs are normal per daughter. 08/08/2011 Swallow study was normal by speech pathology    EGD/Colonoscopy 12/012003 1. Small hiatal hernia without endoscopic evidence of reflux esophagitis.  2. Nonerosive gastritis involving the body and antrum.  3. Two small ulcers at the right colon, biopsy taken.  4. Two small polyps of ablated by cold biopsy from the splenic flexure.  5. Large external hemorrhoids and prominent anal papilla.  see hpi   .Review of Systems Current Outpatient Prescriptions  Medication Sig Dispense Refill  . acetaminophen (TYLENOL) 500 MG tablet Take 500 mg by mouth 2 (two) times daily. Take 2 tabs       . acetaminophen (TYLENOL) 500 MG tablet Take 500 mg by mouth daily as needed. For headache       . aspirin 81 MG tablet Stop this medicine for one month.  30 tablet  0  . atropine 1 % ophthalmic solution Place 1 drop into the right eye 3 (three) times daily.        Marland Kitchen atropine 1 % ophthalmic solution 1 drop 3 (three) times daily.        . citalopram (CELEXA) 20 MG tablet Take 10 mg by mouth daily. Take 1/2 tab      . Difluprednate (DUREZOL) 0.05 % EMUL Place 1 drop into the right eye 3 (three) times daily.       Marland Kitchen docusate sodium ([employer]S STOOL SOFTENER) 100 MG capsule Take 100 mg by mouth 2 (two) times daily.        .  fluticasone (CUTIVATE) 0.05 % cream Apply 1 application topically 2 (two) times daily as needed. Apply to affected face area(S) as needed for irritation       . furosemide (LASIX) 20 MG tablet Take 20 mg by mouth daily.        Marland Kitchen loratadine (CLARITIN) 10 MG tablet Take 10 mg by mouth daily.        Marland Kitchen losartan (COZAAR) 50 MG tablet Take 50 mg by mouth daily.        . memantine (NAMENDA) 10 MG tablet Take 10 mg by mouth 2 (two) times daily.        . mometasone (ELOCON) 0.1 % cream Apply 1 application topically 2 (two) times daily as needed. Apply topically to affected area(S). **DO NOT APPLY TO FACE**       . omeprazole (PRILOSEC) 20 MG capsule Take 20 mg by mouth 2 (two) times daily.        . potassium chloride (K-DUR) 10 MEQ tablet Take 10 mEq by mouth 2 (two) times daily.        . pravastatin (PRAVACHOL) 20 MG tablet Take 10 mg by  mouth daily.        . Vitamin B Complex-C CAPS Take by mouth daily.       . vitamin E 600 UNIT capsule Take 400 Units by mouth daily.       . sucralfate (CARAFATE) 1 GM/10ML suspension Take 10 mLs (1 g total) by mouth 4 (four) times daily.  420 mL  2   Past Surgical History  Procedure Date  . Total abdominal hysterectomy   . Cervical discectomy     Dr. Newell Coral  . Cholecystectomy   . Total knee arthroplasty 2006    Right; Dr. Eulah Pont  . Colonoscopy 2009  . Joint replacement    Past Medical History  Diagnosis Date  . ASCVD (arteriosclerotic cardiovascular disease) 2003    Single vessel disease-60% D1  . Hypertension   . Hyperlipidemia   . Diabetes mellitus     No insulin  . Leukopenia     and anemia-hematology evaluation was nodiagnostic  . Degenerative joint disease     right TKA in 4/06  . Upper GI bleed     gastric stress ulcer in 2003  . Retinal artery branch occlusion of right eye     Sudden onset of right eye blindness-embolism suspected  . Nephrolithiasis   . Gastroesophageal reflux disease with hiatal hernia   . Osteoporosis   . Lung nodule       Left lower lobe  . Parkinson's disease   . Dementia   . Cervical spondylarthritis     S/p discectomy  . Alzheimer disease    History   Social History Narrative   Retired from [employer]    No family status information on file.   Allergies  Allergen Reactions  . Codeine   . Morphine       Objective:   Physical Exam  Filed Vitals:   09/13/11 1518  BP: 128/58  Pulse: 66  Temp: 98.5 F (36.9 C)  Height: 5\' 1"  (1.549 m)  Weight: 183 lb 9.6 oz (83.28 kg)   Alert and oriented. Skin warm and dry. Oral mucosa is moist. Natural teeth in good condition. Sclera anicteric, conjunctivae is pink. Thyroid not enlarged. No cervical lymphadenopathy. Lungs clear. Heart regular rate and rhythm.  Abdomen is soft. Bowel sounds are positive. No hepatomegaly. No abdominal masses felt. No tenderness.  No edema to lower extremities. Patient is alert and oriented.      Assessment:    GERD, Dysphagia which is much better.    Plan:    Will try on Carafate liquid four times a day.  Will see back in 8 weeks,. Keep HOB elevated.

## 2011-09-13 NOTE — Patient Instructions (Signed)
This program cannot display the webpage          Most likely causes:  You are not connected to the Internet.   The website is encountering problems.   There might be a typing error in the address.     What you can try:       Check your Internet connection. Try visiting another website to make sure you are connected.          Retype the address.          Go back to the previous page.         More information  This problem can be caused by a variety of issues, including:   Internet connectivity has been lost.   The website is temporarily unavailable.   The Domain Name Server (DNS) is not reachable.   The Domain Name Server (DNS) does not have a listing for the website's domain.

## 2011-09-21 ENCOUNTER — Emergency Department (HOSPITAL_COMMUNITY): Payer: Medicare Other

## 2011-09-21 ENCOUNTER — Inpatient Hospital Stay (HOSPITAL_COMMUNITY)
Admission: EM | Admit: 2011-09-21 | Discharge: 2011-09-24 | DRG: 694 | Disposition: A | Discharge status: Nursing Home (Includes ICF) | Payer: Medicare Other | Attending: Internal Medicine | Admitting: Internal Medicine

## 2011-09-21 ENCOUNTER — Encounter (HOSPITAL_COMMUNITY): Payer: Self-pay | Admitting: *Deleted

## 2011-09-21 DIAGNOSIS — G20A1 Parkinson's disease without dyskinesia, without mention of fluctuations: Secondary | ICD-10-CM | POA: Diagnosis present

## 2011-09-21 DIAGNOSIS — K219 Gastro-esophageal reflux disease without esophagitis: Secondary | ICD-10-CM | POA: Diagnosis present

## 2011-09-21 DIAGNOSIS — N200 Calculus of kidney: Secondary | ICD-10-CM

## 2011-09-21 DIAGNOSIS — E119 Type 2 diabetes mellitus without complications: Secondary | ICD-10-CM | POA: Diagnosis present

## 2011-09-21 DIAGNOSIS — N281 Cyst of kidney, acquired: Secondary | ICD-10-CM | POA: Diagnosis present

## 2011-09-21 DIAGNOSIS — F0280 Dementia in other diseases classified elsewhere without behavioral disturbance: Secondary | ICD-10-CM | POA: Diagnosis present

## 2011-09-21 DIAGNOSIS — E785 Hyperlipidemia, unspecified: Secondary | ICD-10-CM | POA: Diagnosis present

## 2011-09-21 DIAGNOSIS — Z66 Do not resuscitate: Secondary | ICD-10-CM | POA: Diagnosis present

## 2011-09-21 DIAGNOSIS — N201 Calculus of ureter: Principal | ICD-10-CM | POA: Diagnosis present

## 2011-09-21 DIAGNOSIS — G20 Parkinson's disease: Secondary | ICD-10-CM | POA: Diagnosis present

## 2011-09-21 DIAGNOSIS — G309 Alzheimer's disease, unspecified: Secondary | ICD-10-CM | POA: Diagnosis present

## 2011-09-21 DIAGNOSIS — N179 Acute kidney failure, unspecified: Secondary | ICD-10-CM | POA: Diagnosis present

## 2011-09-21 DIAGNOSIS — I10 Essential (primary) hypertension: Secondary | ICD-10-CM | POA: Diagnosis present

## 2011-09-21 DIAGNOSIS — N1330 Unspecified hydronephrosis: Secondary | ICD-10-CM | POA: Diagnosis present

## 2011-09-21 LAB — CBC
Hemoglobin: 12.2 g/dL (ref 12.0–15.0)
MCHC: 32.6 g/dL (ref 30.0–36.0)
WBC: 8 10*3/uL (ref 4.0–10.5)

## 2011-09-21 LAB — URINE MICROSCOPIC-ADD ON

## 2011-09-21 LAB — URINALYSIS, ROUTINE W REFLEX MICROSCOPIC
Nitrite: NEGATIVE
Specific Gravity, Urine: 1.025 (ref 1.005–1.030)
Urobilinogen, UA: 0.2 mg/dL (ref 0.0–1.0)

## 2011-09-21 LAB — BASIC METABOLIC PANEL
Chloride: 105 mEq/L (ref 96–112)
GFR calc Af Amer: 44 mL/min — ABNORMAL LOW (ref >90)
GFR calc non Af Amer: 38 mL/min — ABNORMAL LOW (ref >90)
Glucose, Bld: 161 mg/dL — ABNORMAL HIGH (ref 70–99)
Potassium: 4 mEq/L (ref 3.5–5.1)
Sodium: 139 mEq/L (ref 135–145)

## 2011-09-21 MED ORDER — PROMETHAZINE HCL 25 MG/ML IJ SOLN: 12.5000 mg | Freq: Four times a day (QID) | INTRAMUSCULAR | Status: DC | PRN

## 2011-09-21 MED ORDER — ACETAMINOPHEN 650 MG RE SUPP: 650.0000 mg | Freq: Four times a day (QID) | RECTAL | Status: DC | PRN

## 2011-09-21 MED ORDER — PROMETHAZINE HCL 25 MG/ML IJ SOLN
12.5000 mg | Freq: Once | INTRAMUSCULAR | Status: AC
  Administered 2011-09-21: 12.5 mg via INTRAVENOUS

## 2011-09-21 MED ORDER — SUCRALFATE 1 G PO TABS
1.0000 g | ORAL_TABLET | Freq: Four times a day (QID) | ORAL | Status: DC
  Administered 2011-09-22 – 2011-09-24 (×8): 1 g via ORAL
  Filled 2011-09-21 (×8): qty 1

## 2011-09-21 MED ORDER — SODIUM CHLORIDE 0.9 % IV BOLUS (SEPSIS)
250.0000 mL | Freq: Once | INTRAVENOUS | Status: AC
  Administered 2011-09-21: 250 mL via INTRAVENOUS

## 2011-09-21 MED ORDER — ONDANSETRON HCL 4 MG/2ML IJ SOLN
4.0000 mg | Freq: Four times a day (QID) | INTRAMUSCULAR | Status: DC | PRN
  Administered 2011-09-21 – 2011-09-22 (×2): 4 mg via INTRAVENOUS
  Filled 2011-09-21 (×2): qty 2

## 2011-09-21 MED ORDER — PANTOPRAZOLE SODIUM 40 MG PO TBEC
40.0000 mg | DELAYED_RELEASE_TABLET | Freq: Every day | ORAL | Status: DC
  Administered 2011-09-22 – 2011-09-24 (×2): 40 mg via ORAL
  Filled 2011-09-21 (×2): qty 1

## 2011-09-21 MED ORDER — ALPRAZOLAM 0.25 MG PO TABS
0.2500 mg | ORAL_TABLET | Freq: Every day | ORAL | Status: DC | PRN
  Administered 2011-09-22 – 2011-09-24 (×3): 0.25 mg via ORAL
  Filled 2011-09-21 (×4): qty 1

## 2011-09-21 MED ORDER — DOCUSATE SODIUM 100 MG PO CAPS
100.0000 mg | ORAL_CAPSULE | Freq: Two times a day (BID) | ORAL | Status: DC
  Administered 2011-09-22 – 2011-09-24 (×4): 100 mg via ORAL
  Filled 2011-09-21 (×4): qty 1

## 2011-09-21 MED ORDER — HYDROMORPHONE HCL 1 MG/ML IJ SOLN
0.5000 mg | Freq: Once | INTRAMUSCULAR | Status: AC
  Administered 2011-09-21: 0.5 mg via INTRAVENOUS
  Filled 2011-09-21: qty 1

## 2011-09-21 MED ORDER — ONDANSETRON HCL 4 MG PO TABS: 4.0000 mg | ORAL_TABLET | Freq: Four times a day (QID) | ORAL | Status: DC | PRN

## 2011-09-21 MED ORDER — ATROPINE SULFATE 1 % OP SOLN
1.0000 [drp] | Freq: Three times a day (TID) | OPHTHALMIC | Status: DC
  Administered 2011-09-21 – 2011-09-24 (×7): 1 [drp] via OPHTHALMIC
  Filled 2011-09-21: qty 2

## 2011-09-21 MED ORDER — SIMVASTATIN 10 MG PO TABS
5.0000 mg | ORAL_TABLET | Freq: Every day | ORAL | Status: DC
  Administered 2011-09-22 – 2011-09-23 (×2): 5 mg via ORAL
  Filled 2011-09-21 (×2): qty 1

## 2011-09-21 MED ORDER — TAMSULOSIN HCL 0.4 MG PO CAPS
0.4000 mg | ORAL_CAPSULE | Freq: Every day | ORAL | Status: DC
  Administered 2011-09-22 – 2011-09-24 (×2): 0.4 mg via ORAL
  Filled 2011-09-21 (×2): qty 1

## 2011-09-21 MED ORDER — CITALOPRAM HYDROBROMIDE 20 MG PO TABS
10.0000 mg | ORAL_TABLET | Freq: Every day | ORAL | Status: DC
  Administered 2011-09-22 – 2011-09-24 (×2): 10 mg via ORAL
  Filled 2011-09-21 (×2): qty 1

## 2011-09-21 MED ORDER — PROMETHAZINE HCL 12.5 MG PO TABS: 12.5000 mg | ORAL_TABLET | Freq: Four times a day (QID) | ORAL | Status: DC | PRN

## 2011-09-21 MED ORDER — IOHEXOL 300 MG/ML  SOLN
50.0000 mL | Freq: Once | INTRAMUSCULAR | Status: AC | PRN
  Administered 2011-09-21: 50 mL via INTRAVENOUS

## 2011-09-21 MED ORDER — ASPIRIN 81 MG PO TABS: 81.0000 mg | ORAL_TABLET | Freq: Every day | ORAL | Status: DC

## 2011-09-21 MED ORDER — PROMETHAZINE HCL 25 MG/ML IJ SOLN
INTRAMUSCULAR | Status: AC
  Administered 2011-09-21: 12.5 mg via INTRAVENOUS
  Filled 2011-09-21: qty 1

## 2011-09-21 MED ORDER — ONDANSETRON HCL 4 MG/2ML IJ SOLN
4.0000 mg | Freq: Once | INTRAMUSCULAR | Status: AC
  Administered 2011-09-21: 4 mg via INTRAVENOUS
  Filled 2011-09-21: qty 2

## 2011-09-21 MED ORDER — DIFLUPREDNATE 0.05 % OP EMUL
1.0000 [drp] | Freq: Three times a day (TID) | OPHTHALMIC | Status: DC
  Administered 2011-09-22 – 2011-09-24 (×6): 1 [drp] via OPHTHALMIC
  Filled 2011-09-21 (×13): qty 0.1

## 2011-09-21 MED ORDER — ONDANSETRON HCL 4 MG/2ML IJ SOLN
INTRAMUSCULAR | Status: AC
  Administered 2011-09-21: 4 mg
  Filled 2011-09-21: qty 2

## 2011-09-21 MED ORDER — ACETAMINOPHEN 325 MG PO TABS: 650.0000 mg | ORAL_TABLET | Freq: Four times a day (QID) | ORAL | Status: DC | PRN

## 2011-09-21 MED ORDER — MEMANTINE HCL 10 MG PO TABS
10.0000 mg | ORAL_TABLET | Freq: Two times a day (BID) | ORAL | Status: DC
  Administered 2011-09-22 – 2011-09-24 (×4): 10 mg via ORAL
  Filled 2011-09-21 (×4): qty 1

## 2011-09-21 MED ORDER — SENNA 8.6 MG PO TABS: 2.0000 | ORAL_TABLET | Freq: Every day | ORAL | Status: DC | PRN

## 2011-09-21 MED ORDER — ASPIRIN 81 MG PO CHEW
81.0000 mg | CHEWABLE_TABLET | Freq: Every day | ORAL | Status: DC
  Administered 2011-09-22 – 2011-09-24 (×2): 81 mg via ORAL
  Filled 2011-09-21 (×2): qty 1

## 2011-09-21 MED ORDER — HYDROMORPHONE HCL 1 MG/ML IJ SOLN
0.5000 mg | Freq: Once | INTRAMUSCULAR | Status: DC
  Administered 2011-09-21: 0.5 mg via INTRAVENOUS
  Filled 2011-09-21: qty 1

## 2011-09-21 MED ORDER — HEPARIN SODIUM (PORCINE) 5000 UNIT/ML IJ SOLN
5000.0000 [IU] | Freq: Three times a day (TID) | INTRAMUSCULAR | Status: DC
  Administered 2011-09-21 – 2011-09-24 (×9): 5000 [IU] via SUBCUTANEOUS
  Filled 2011-09-21 (×9): qty 1

## 2011-09-21 MED ORDER — SODIUM CHLORIDE 0.9 % IV BOLUS (SEPSIS)
500.0000 mL | Freq: Once | INTRAVENOUS | Status: AC
  Administered 2011-09-21: 500 mL via INTRAVENOUS

## 2011-09-21 MED ORDER — SODIUM CHLORIDE 0.9 % IV SOLN
INTRAVENOUS | Status: DC
  Administered 2011-09-21 – 2011-09-23 (×4): via INTRAVENOUS

## 2011-09-21 NOTE — ED Notes (Signed)
Patient is sleepy, waiting for evaluation by physician.

## 2011-09-21 NOTE — ED Notes (Signed)
Oral swabs with glass of water to room for pt use.

## 2011-09-21 NOTE — ED Notes (Signed)
Pt completed contrast.  Ct notified.  C/o returned nausea; EDP notified and order rec'd for Zofran 4mg  IV.

## 2011-09-21 NOTE — ED Notes (Signed)
Medicated for pain as ordered; resting in no distress after administration; respirations even, non-labored; VSS.

## 2011-09-21 NOTE — ED Notes (Addendum)
C/o Rt abd pain, n/v onset yesterday.

## 2011-09-21 NOTE — ED Provider Notes (Signed)
History     CSN: [id number] Arrival date & time: 09/21/2011  1:16 PM   First MD Initiated Contact with Patient 09/21/11 1318      Chief Complaint  Patient presents with  . Abdominal Pain  . Emesis    (Consider location/radiation/quality/duration/timing/severity/associated sxs/prior treatment) HPI  Past Medical History  Diagnosis Date  . ASCVD (arteriosclerotic cardiovascular disease) 2003    Single vessel disease-60% D1  . Hypertension   . Hyperlipidemia   . Diabetes mellitus     No insulin  . Leukopenia     and anemia-hematology evaluation was nodiagnostic  . Degenerative joint disease     right TKA in 4/06  . Upper GI bleed     gastric stress ulcer in 2003  . Retinal artery branch occlusion of right eye     Sudden onset of right eye blindness-embolism suspected  . Nephrolithiasis   . Gastroesophageal reflux disease with hiatal hernia   . Osteoporosis   . Lung nodule     Left lower lobe  . Parkinson's disease   . Dementia   . Cervical spondylarthritis     S/p discectomy  . Alzheimer disease     Past Surgical History  Procedure Date  . Total abdominal hysterectomy   . Cervical discectomy     Dr. Newell Coral  . Cholecystectomy   . Total knee arthroplasty 2006    Right; Dr. Eulah Pont  . Colonoscopy 2009  . Joint replacement   . Vascular surgery     Family History  Problem Relation Age of Onset  . Diabetes Sister   . Heart disease Sister   . Diabetes Brother     History  Substance Use Topics  . Smoking status: Never Smoker   . Smokeless tobacco: Never Used  . Alcohol Use: No    OB History    Grav Para Term Preterm Abortions TAB SAB Ect Mult Living                  Review of Systems  Allergies  Codeine and Morphine  Home Medications   Current Outpatient Rx  Name Route Sig Dispense Refill  . ACETAMINOPHEN 500 MG PO TABS Oral Take 500 mg by mouth 2 (two) times daily. Or as needed for headache    . ALPRAZOLAM 0.25 MG PO TABS Oral Take 0.25  mg by mouth daily as needed. For agitation. **Not for frequent Use**     . ASPIRIN 81 MG PO TABS Oral Take 81 mg by mouth daily.      . ATROPINE SULFATE 1 % OP SOLN Right Eye Place 1 drop into the right eye 3 (three) times daily.     Marland Kitchen CITALOPRAM HYDROBROMIDE 20 MG PO TABS Oral Take 10 mg by mouth daily. Take 1/2 tab    . DIFLUPREDNATE 0.05 % OP EMUL Right Eye Place 1 drop into the right eye 3 (three) times daily.     Marland Kitchen DOCUSATE SODIUM 100 MG PO CAPS Oral Take 100 mg by mouth 2 (two) times daily.      Marland Kitchen LORATADINE 10 MG PO TABS Oral Take 10 mg by mouth daily.      Marland Kitchen LOSARTAN POTASSIUM 25 MG PO TABS Oral Take 25 mg by mouth daily.      Marland Kitchen MEMANTINE HCL 10 MG PO TABS Oral Take 10 mg by mouth 2 (two) times daily.      Marland Kitchen OMEPRAZOLE 40 MG PO CPDR Oral Take 40 mg by mouth 2 (two) times daily  before a meal. Take 30 minutes before breakfast and supper     . POTASSIUM CHLORIDE CR 10 MEQ PO TBCR Oral Take 10 mEq by mouth daily.     Marland Kitchen PRAVASTATIN SODIUM 20 MG PO TABS Oral Take 10 mg by mouth daily.      . SUCRALFATE 1 G PO TABS Oral Take 1 g by mouth 4 (four) times daily.      Marland Kitchen VITAMIN B COMPLEX-C PO CAPS Oral Take 1 capsule by mouth daily.     Marland Kitchen VITAMIN E 400 UNITS PO CAPS Oral Take 400 Units by mouth daily.      . FUROSEMIDE 20 MG PO TABS        BP 176/66  Pulse 66  Temp(Src) 97.4 F (36.3 C) (Axillary)  Resp 20  Ht 5\' 2"  (1.575 m)  Wt 170 lb (77.111 kg)  BMI 31.09 kg/m2  SpO2 99%  Physical Exam  ED Course  Procedures (including critical care time)  Labs Reviewed  CBC - Abnormal; Notable for the following:    RBC 3.72 (*)    MCV 100.5 (*)    All other components within normal limits  BASIC METABOLIC PANEL - Abnormal; Notable for the following:    Glucose, Bld 161 (*)    BUN 29 (*)    Creatinine, Ser 1.25 (*)    GFR calc non Af Amer 38 (*)    GFR calc Af Amer 44 (*)    All other components within normal limits  URINALYSIS, ROUTINE W REFLEX MICROSCOPIC - Abnormal; Notable for the  following:    Hgb urine dipstick TRACE (*)    All other components within normal limits  URINE MICROSCOPIC-ADD ON   Ct Abdomen Pelvis W Contrast  09/21/2011  *RADIOLOGY REPORT*  Clinical Data: 77 year old with right lower quadrant pain.  CT ABDOMEN AND PELVIS WITH CONTRAST  Technique:  Multidetector CT imaging of the abdomen and pelvis was performed following the standard protocol during bolus administration of intravenous contrast.  Contrast: 50mL OMNIPAQUE IOHEXOL 300 MG/ML IV SOLN  Comparison: None.  Findings: There are coronary artery calcifications.  There is a hiatal hernia containing oral contrast.  There is massive dilatation of the right renal pelvis and renal calyces.  There is fluid surrounding the right kidney.  There is a large stone at the right ureteropelvic junction measuring up to 1.2 cm.  There is an additional stone in the posterior right renal pelvis measuring 8 mm.  Punctate stone in the right kidney upper pole.  There is marked irregularity of the right kidney mid/lower pole.  This may represent a combination of complex cystic structures containing calcifications and calyceal dilatation.  The partially calcified cystic structure roughly measure 4.9 x 2.6 cm.  There is a comma shaped stone in the left kidney lower pole that roughly measures 1 cm.  No evidence for left hydronephrosis.  There is no evidence for ureter stones.  Slightly increased density in the distal left ureter but no evidence for left ureter dilatation or obstruction.  No gross abnormality to the urinary bladder.  The uterus has been removed.  Normal appearance of the liver and portal venous system.  Mild dilatation of the intrahepatic bile ducts probably related to a cholecystectomy.  No gross abnormality to the pancreas or spleen.  No evidence for lymphadenopathy or pelvic free fluid.  There is a tiny ventral hernia containing fat.  Appendix appears to be normal.  Multilevel degenerative changes in the lower lumbar  spine.  There is volume loss at the lung bases.  No evidence for free air.  IMPRESSION: Severe right hydronephrosis due to a 1.2 cm stone at the right ureteropelvic junction.  There is fluid and stranding around the right kidney. There is also an 0.8 cm stone in the right renal pelvis.  Complex cystic structure in the right kidney mid/lower pole.  This area is difficult to evaluate due to the adjacent hydronephrosis. Findings are concerning for a complex cystic lesion.  Recommend further evaluation to exclude a cystic neoplasm.  Recommend pre and post contrast MRI of the kidneys after the right hydronephrosis has been resolved.  Nonobstructive 1 cm left kidney stone.  Hiatal hernia.  These results were called by telephone on 09/21/2011  at  4:50 p.m. to  Dr. Adriana Simas, who verbally acknowledged these results.  Original Report Authenticated By: Richarda Overlie, M.D.     No diagnosis found.    MDM   CT scan shows right hydronephrosis secondary to 12 mm stone at the right ureteropelvic junction there is also fluid and stranding around the kidney. Additionally a 8 mm stone is noted in the right renal pelvis. Also a complex cystic structure noted in kidney. Patient will be admitted.       Donnetta Hutching, MD 09/22/11 [phone]

## 2011-09-21 NOTE — ED Notes (Signed)
C/o nausea and actively vomiting; EDP notified and order rec'd for Phenergan 12.5mg  IV.

## 2011-09-21 NOTE — ED Notes (Signed)
C/o right sided abd pain onset yesterday; pt moaning in pain; states pain radiates to rectum; c/o n/v onset yesterday; denies diarrhea; states last normal BM yesterday.

## 2011-09-21 NOTE — H&P (Signed)
PCP:   Lilyan Punt, MD, MD   Chief Complaint:  R flank pain  HPI: 77yoF with h/o Parkinson's vs Alzheimer's dementia, living at [facility], CAD with 60% D1, HTN / HL / DM not on insulin, retinal artery occlusion, h/o nephrolithiasis, GERD/hiatal hernia presents with R flank pain and found to have 12mm R ureteropelvic junction stone, complex cystic kidney lesion, mild AKI.   History gathered from family at bedside. Pt lives at [facility], family states she was in usual state of health until this am when one of her 6 daughters was visiting and she noted RUQ / flank pain, but not much other symptoms. She was brought to the ED where CT imaging showed a 1.2 cm stone in her kidney and severe right hydronephrosis. WBC's were normal, other vitals stable, but Cr a bit elevated to 1.2. ED contacted Dr. Jerre Simon who recommended medical admission and would see pt tomorrow. UA did not show infection. Of note, CT scan also shows ? complex kidney cyst that needs to be followed.   On evaluation, pt sleeping from getting antiemetic but arouses appropriately. Appears stable. Family endorses pt was vomiting earlier during ED course but not now after medicated. Has been getting Cipro x1 month for presumed UTI at [facility] that was diagnosed based on pt agitation, but no clear dysuria. No reported f / c / ns, cardiopulmonary symptoms although family states she's had minor wheezing. No frank chest pain or respiratory distress. No diarrhea / constipation. Has dementia but at baseline can recognize family faces, express her needs, walk with cane. ROS o/w negative.    Past Medical History  Diagnosis Date  . ASCVD (arteriosclerotic cardiovascular disease) 2003    Single vessel disease-60% D1  . Hypertension   . Hyperlipidemia   . Diabetes mellitus     No insulin  . Leukopenia     and anemia-hematology evaluation was nodiagnostic  . Degenerative joint disease     right TKA in 4/06  . Upper GI bleed    gastric stress ulcer in 2003  . Retinal artery branch occlusion of right eye     Sudden onset of right eye blindness-embolism suspected  . Nephrolithiasis   . Gastroesophageal reflux disease with hiatal hernia   . Osteoporosis   . Lung nodule     Left lower lobe  . Parkinson's disease   . Dementia   . Cervical spondylarthritis     S/p discectomy  . Alzheimer disease     Past Surgical History  Procedure Date  . Total abdominal hysterectomy   . Cervical discectomy     Dr. Newell Coral  . Cholecystectomy   . Total knee arthroplasty 2006    Right; Dr. Eulah Pont  . Colonoscopy 2009  . Joint replacement   . Vascular surgery     Medications:  HOME MEDS: Prior to Admission medications   Medication Sig Start Date End Date Taking? Authorizing Provider  acetaminophen (TYLENOL) 500 MG tablet Take 500 mg by mouth 2 (two) times daily. Or as needed for headache   Yes Historical Provider, MD  ALPRAZolam (XANAX) 0.25 MG tablet Take 0.25 mg by mouth daily as needed. For agitation. **Not for frequent Use**    Yes Historical Provider, MD  aspirin 81 MG tablet Take 81 mg by mouth daily.   06/27/11  Yes Sendil K Krishnan  atropine 1 % ophthalmic solution Place 1 drop into the right eye 3 (three) times daily.    Yes Historical Provider, MD  citalopram (  CELEXA) 20 MG tablet Take 10 mg by mouth daily. Take 1/2 tab   Yes Historical Provider, MD  Difluprednate (DUREZOL) 0.05 % EMUL Place 1 drop into the right eye 3 (three) times daily.    Yes Historical Provider, MD  docusate sodium ([employer] STOOL SOFTENER) 100 MG capsule Take 100 mg by mouth 2 (two) times daily.     Yes Historical Provider, MD  loratadine (CLARITIN) 10 MG tablet Take 10 mg by mouth daily.     Yes Historical Provider, MD  losartan (COZAAR) 25 MG tablet Take 25 mg by mouth daily.     Yes Historical Provider, MD  memantine (NAMENDA) 10 MG tablet Take 10 mg by mouth 2 (two) times daily.     Yes Historical Provider, MD  omeprazole (PRILOSEC) 40 MG  capsule Take 40 mg by mouth 2 (two) times daily before a meal. Take 30 minutes before breakfast and supper    Yes Historical Provider, MD  potassium chloride (K-DUR) 10 MEQ tablet Take 10 mEq by mouth daily.    Yes Historical Provider, MD  pravastatin (PRAVACHOL) 20 MG tablet Take 10 mg by mouth daily.     Yes Historical Provider, MD  sucralfate (CARAFATE) 1 G tablet Take 1 g by mouth 4 (four) times daily.     Yes Historical Provider, MD  Vitamin B Complex-C CAPS Take 1 capsule by mouth daily.    Yes Historical Provider, MD  vitamin E 400 UNIT capsule Take 400 Units by mouth daily.     Yes Historical Provider, MD  furosemide (LASIX) 20 MG tablet  08/19/11   Historical Provider, MD    Allergies:  Allergies  Allergen Reactions  . Codeine   . Morphine     Social History:  Has 6 daughters, at least one granddaughter. Has dementia, lives at [facility]. Never smoker, doesn't drink. Can walk with cane at baseline, can recognize family, but sometimes forgets names. Can express needs.   Family History: Family History  Problem Relation Age of Onset  . Diabetes Sister   . Heart disease Sister   . Diabetes Brother     Physical Exam: Filed Vitals:   09/21/11 1834 09/21/11 1915 09/21/11 1926 09/21/11 2030  BP: 181/61 178/96 178/98 177/69  Pulse: 65 56 59   Temp: 98 F (36.7 C)     TempSrc: Oral     Resp:  15  19  Height:      Weight:      SpO2: 97% 98% 100%    Blood pressure 177/69, pulse 59, temperature 98 F (36.7 C), temperature source Oral, resp. rate 19, height 5\' 2"  (1.575 m), weight 77.111 kg (170 lb), SpO2 100.00%.  Gen: elderly F laying in ED stretcher, family at bedside. Awaken to voice and follows minimal commands. Is sleepy due to Phenergan per family, as she was more alert and vomiting earlier. Appears comfortable. Can answer questions minimally.  HEENT: Pupil enucleation on the R, but appears normal on the L about 3-4 mm. EOMI grossly intact but unable to fully evaluate.  Sclera grossly clear. Mouth moist but won't open fully. No gross lesions I can tell.  Neck: supple, normal Lungs: Not fully evaluated, won't sit up. But grossly clear anterolaterally with fair air movement Heart: S1/S2 not fully appreciated due to soft HS's and body habitus, but no gross m/g. Bilateral radials easily palpable and regular though.  Abd: Overweight but not grossly obese, soft, non-peritoneal, benign Extrem: decent muscle tone, no BLE edema, warm  and not cool or cyanotic. Unimpressive Neuro: Awakens to aggressive verbal stimulation and can answer simple questions with head nodding but not fully conversant, sleepy appearing. Unable to fully assess.     Labs & Imaging Results for orders placed during the hospital encounter of 09/21/11 (from the past 48 hour(s))  CBC     Status: Abnormal   Collection Time   09/21/11  1:27 PM      Component Value Range Comment   WBC 8.0  4.0 - 10.5 (K/uL)    RBC 3.72 (*) 3.87 - 5.11 (MIL/uL)    Hemoglobin 12.2  12.0 - 15.0 (g/dL)    HCT 16.1  09.6 - 04.5 (%)    MCV 100.5 (*) 78.0 - 100.0 (fL)    MCH 32.8  26.0 - 34.0 (pg)    MCHC 32.6  30.0 - 36.0 (g/dL)    RDW 40.9  81.1 - 91.4 (%)    Platelets 203  150 - 400 (K/uL)   BASIC METABOLIC PANEL     Status: Abnormal   Collection Time   09/21/11  1:27 PM      Component Value Range Comment   Sodium 139  135 - 145 (mEq/L)    Potassium 4.0  3.5 - 5.1 (mEq/L)    Chloride 105  96 - 112 (mEq/L)    CO2 24  19 - 32 (mEq/L)    Glucose, Bld 161 (*) 70 - 99 (mg/dL)    BUN 29 (*) 6 - 23 (mg/dL)    Creatinine, Ser 7.82 (*) 0.50 - 1.10 (mg/dL)    Calcium 9.7  8.4 - 10.5 (mg/dL)    GFR calc non Af Amer 38 (*) >90 (mL/min)    GFR calc Af Amer 44 (*) >90 (mL/min)   URINALYSIS, ROUTINE W REFLEX MICROSCOPIC     Status: Abnormal   Collection Time   09/21/11  2:38 PM      Component Value Range Comment   Color, Urine YELLOW  YELLOW     Appearance CLEAR  CLEAR     Specific Gravity, Urine 1.025  1.005 - 1.030      pH 5.5  5.0 - 8.0     Glucose, UA NEGATIVE  NEGATIVE (mg/dL)    Hgb urine dipstick TRACE (*) NEGATIVE     Bilirubin Urine NEGATIVE  NEGATIVE     Ketones, ur NEGATIVE  NEGATIVE (mg/dL)    Protein, ur NEGATIVE  NEGATIVE (mg/dL)    Urobilinogen, UA 0.2  0.0 - 1.0 (mg/dL)    Nitrite NEGATIVE  NEGATIVE     Leukocytes, UA NEGATIVE  NEGATIVE    URINE MICROSCOPIC-ADD ON     Status: Normal   Collection Time   09/21/11  2:38 PM      Component Value Range Comment   Squamous Epithelial / LPF RARE  RARE     WBC, UA 0-2  <3 (WBC/hpf)    RBC / HPF 0-2  <3 (RBC/hpf)    Bacteria, UA RARE  RARE     Ct Abdomen Pelvis W Contrast  09/21/2011  *RADIOLOGY REPORT*  Clinical Data: 77 year old with right lower quadrant pain.  CT ABDOMEN AND PELVIS WITH CONTRAST  Technique:  Multidetector CT imaging of the abdomen and pelvis was performed following the standard protocol during bolus administration of intravenous contrast.  Contrast: 50mL OMNIPAQUE IOHEXOL 300 MG/ML IV SOLN  Comparison: None.  Findings: There are coronary artery calcifications.  There is a hiatal hernia containing oral contrast.  There is massive dilatation  of the right renal pelvis and renal calyces.  There is fluid surrounding the right kidney.  There is a large stone at the right ureteropelvic junction measuring up to 1.2 cm.  There is an additional stone in the posterior right renal pelvis measuring 8 mm.  Punctate stone in the right kidney upper pole.  There is marked irregularity of the right kidney mid/lower pole.  This may represent a combination of complex cystic structures containing calcifications and calyceal dilatation.  The partially calcified cystic structure roughly measure 4.9 x 2.6 cm.  There is a comma shaped stone in the left kidney lower pole that roughly measures 1 cm.  No evidence for left hydronephrosis.  There is no evidence for ureter stones.  Slightly increased density in the distal left ureter but no evidence for left ureter  dilatation or obstruction.  No gross abnormality to the urinary bladder.  The uterus has been removed.  Normal appearance of the liver and portal venous system.  Mild dilatation of the intrahepatic bile ducts probably related to a cholecystectomy.  No gross abnormality to the pancreas or spleen.  No evidence for lymphadenopathy or pelvic free fluid.  There is a tiny ventral hernia containing fat.  Appendix appears to be normal.  Multilevel degenerative changes in the lower lumbar spine.  There is volume loss at the lung bases.  No evidence for free air.  IMPRESSION: Severe right hydronephrosis due to a 1.2 cm stone at the right ureteropelvic junction.  There is fluid and stranding around the right kidney. There is also an 0.8 cm stone in the right renal pelvis.  Complex cystic structure in the right kidney mid/lower pole.  This area is difficult to evaluate due to the adjacent hydronephrosis. Findings are concerning for a complex cystic lesion.  Recommend further evaluation to exclude a cystic neoplasm.  Recommend pre and post contrast MRI of the kidneys after the right hydronephrosis has been resolved.  Nonobstructive 1 cm left kidney stone.  Hiatal hernia.  These results were called by telephone on 09/21/2011  at  4:50 p.m. to  Dr. Adriana Simas, who verbally acknowledged these results.  Original Report Authenticated By: Richarda Overlie, M.D.    Impression Present on Admission:  .HYPERLIPIDEMIA .HYPERTENSION .NEPHROLITHIASIS .PARKINSON'S DISEASE .Diabetes mellitus .Acute kidney injury .Acquired cyst of kidney  77yoF with h/o Parkinson's vs Alzheimer's dementia, living at [facility], CAD with 60% D1, HTN / HL / DM not on insulin, retinal artery occlusion, h/o nephrolithiasis, GERD/hiatal hernia presents with R flank pain and found to have 12mm R ureteropelvic junction stone, complex cystic kidney lesion, mild AKI.   1. R ureteropelvic jxn 1.2 cm stone with associated severe R hydronephrosis. Also with 0.8 cm  right renal pelvis stone. No associated WBC count, fevers, or hemodynamic instability to suggest urosepsis/infectious complication. UA shows no signs of infection.   - No ABx for now, monitor WBC's, fevers, etc. - Urology aware, likely needs procedural management - Pain and nausea control  - Tamsulosin 0.4 mg daily, although doubt it will pass stone  2. R kidney mid/lower pole complex cystic lesion: Would need follow up with MR imaging to r/o renal cell ca once acute illness resolved.   3. Acute kidney injury: Cr is 1.25 from last value of 0.9 in 06/2011, therefore some element of renal insufficiency with this hydronephrosis. We'll keep her well hydrated and will definitive management per Urology.   - Trend BMET's, holding home Lasix, Losartan  4. Dementia: home Xanax PRN for  agitation. Continue Namenda, celexa   5. HTN / HL / DM: home ASA 81 daily, statin alternative. Not previously on DM meds, will just monitor CBG's for now, hold on SSI. No previous HTN meds, would monitor for now and treat with PO Labetalol for SBP >160   6. GERD: home PPI, sucralfate  7. S/p retinal artery occlusion: Continue home eye drops  8. Med rec: holding all other home non-essential meds.   Pt is DNR as discussed with family. They state previous paperwork has been filled out.   Other plans as per orders.   Kimra Kantor 09/21/2011, 9:26 PM

## 2011-09-21 NOTE — ED Provider Notes (Signed)
Scribed for [name] Gaskins, MD, the patient was seen in room [facility] . This chart was scribed by Ellie Lunch.   CSN: [id number] Arrival date & time: 09/21/2011  1:16 PM   First MD Initiated Contact with Patient 09/21/11 1318      Chief Complaint  Patient presents with  . Abdominal Pain  . Emesis     HPI Pt seen at 1:20 Level 5 caveat for Dementia.  [name] [name] is a 77 y.o. female brought in by ambulance, who presents to the Emergency Department complaining of abdominal pain with associated emesis since yesterday. Abdominal pain radiates to rectom. Denies diarrhea. Pt is a resident of [facility].  No CP is reported She has had multiple episodes of vomiting The abd pain is worsening She can not describe what makes the abdominal pain worse  Past Medical History  Diagnosis Date  . ASCVD (arteriosclerotic cardiovascular disease) 2003    Single vessel disease-60% D1  . Hypertension   . Hyperlipidemia   . Diabetes mellitus     No insulin  . Leukopenia     and anemia-hematology evaluation was nodiagnostic  . Degenerative joint disease     right TKA in 4/06  . Upper GI bleed     gastric stress ulcer in 2003  . Retinal artery branch occlusion of right eye     Sudden onset of right eye blindness-embolism suspected  . Nephrolithiasis   . Gastroesophageal reflux disease with hiatal hernia   . Osteoporosis   . Lung nodule     Left lower lobe  . Parkinson's disease   . Dementia   . Cervical spondylarthritis     S/p discectomy  . Alzheimer disease     Past Surgical History  Procedure Date  . Total abdominal hysterectomy   . Cervical discectomy     Dr. Newell Coral  . Cholecystectomy   . Total knee arthroplasty 2006    Right; Dr. Eulah Pont  . Colonoscopy 2009  . Joint replacement   . Vascular surgery     Family History  Problem Relation Age of Onset  . Diabetes Sister   . Heart disease Sister   . Diabetes Brother     History  Substance Use Topics  .  Smoking status: Never Smoker   . Smokeless tobacco: Never Used  . Alcohol Use: No   Review of Systems  Unable to perform ROS: Dementia    Allergies  Codeine and Morphine  Home Medications   Current Outpatient Rx  Name Route Sig Dispense Refill  . ACETAMINOPHEN 500 MG PO TABS Oral Take 500 mg by mouth 2 (two) times daily. Take 2 tabs     . ACETAMINOPHEN 500 MG PO TABS Oral Take 500 mg by mouth daily as needed. For headache     . ASPIRIN 81 MG PO TABS  Stop this medicine for one month. 30 tablet 0  . ATROPINE SULFATE 1 % OP SOLN Right Eye Place 1 drop into the right eye 3 (three) times daily.      . ATROPINE SULFATE 1 % OP SOLN  1 drop 3 (three) times daily.      Marland Kitchen CITALOPRAM HYDROBROMIDE 20 MG PO TABS Oral Take 10 mg by mouth daily. Take 1/2 tab    . DIFLUPREDNATE 0.05 % OP EMUL Right Eye Place 1 drop into the right eye 3 (three) times daily.     Marland Kitchen DOCUSATE SODIUM 100 MG PO CAPS Oral Take 100 mg by mouth 2 (two)  times daily.      Marland Kitchen FLUTICASONE PROPIONATE 0.05 % EX CREA Topical Apply 1 application topically 2 (two) times daily as needed. Apply to affected face area(S) as needed for irritation     . FUROSEMIDE 20 MG PO TABS Oral Take 20 mg by mouth daily.      Marland Kitchen LORATADINE 10 MG PO TABS Oral Take 10 mg by mouth daily.      Marland Kitchen LOSARTAN POTASSIUM 50 MG PO TABS Oral Take 50 mg by mouth daily.      Marland Kitchen MEMANTINE HCL 10 MG PO TABS Oral Take 10 mg by mouth 2 (two) times daily.      . MOMETASONE FUROATE 0.1 % EX CREA Topical Apply 1 application topically 2 (two) times daily as needed. Apply topically to affected area(S). **DO NOT APPLY TO FACE**     . OMEPRAZOLE 20 MG PO CPDR Oral Take 20 mg by mouth 2 (two) times daily.      Marland Kitchen POTASSIUM CHLORIDE CR 10 MEQ PO TBCR Oral Take 10 mEq by mouth 2 (two) times daily.      Marland Kitchen PRAVASTATIN SODIUM 20 MG PO TABS Oral Take 10 mg by mouth daily.      . SUCRALFATE 1 GM/10ML PO SUSP Oral Take 10 mLs (1 g total) by mouth 4 (four) times daily. 420 mL 2  . VITAMIN B  COMPLEX-C PO CAPS Oral Take by mouth daily.     Marland Kitchen VITAMIN E 600 UNITS PO CAPS Oral Take 400 Units by mouth daily.       BP 172/89  Pulse 66  Temp(Src) 98 F (36.7 C) (Oral)  Resp 18  Ht 5\' 2"  (1.575 m)  Wt 170 lb (77.111 kg)  BMI 31.09 kg/m2  SpO2 96%  BP 172/89  Pulse 66  Temp(Src) 98 F (36.7 C) (Oral)  Resp 18  Ht 5\' 2"  (1.575 m)  Wt 170 lb (77.111 kg)  BMI 31.09 kg/m2  SpO2 96%   Physical Exam CONSTITUTIONAL: Well developed/well nourished, anxious appearing HEAD AND FACE: Normocephalic/atraumatic EYES: EOMI/PERRL ENMT: Mucous membranes dry NECK: supple no meningeal signs SPINE:entire spine nontender CV: no murmurs/rubs/gallops noted LUNGS:  no apparent distress ABDOMEN: soft, exquisite tenderness RLQ with no rebound or guarding GU:no cva tenderness NEURO: Pt is awake/alert, moves all extremitiesx4, pleasantly demented EXTREMITIES: pulses normal, full ROM SKIN: warm, color normal PSYCH: no abnormalities of mood noted   ED Course  Procedures (including critical care time) OTHER DATA REVIEWED: Nursing notes, vital signs, and past medical records reviewed. All labs/vitals reviewed and considered   2:58 PM D/w patient's family, reports h/o frequent uti but usually does not have such intense abd pain Will get CT a/p to rule out acute abd process  3:57 PM D/w dr Adriana Simas to f/u on CT imaging Pt stable at this time BP 176/66  Pulse 66  Temp(Src) 97.4 F (36.3 C) (Axillary)  Resp 20  Ht 5\' 2"  (1.575 m)  Wt 170 lb (77.111 kg)  BMI 31.09 kg/m2  SpO2 99%   DIAGNOSTIC STUDIES: Oxygen Saturation is 96% on room air, normal by my interpretation.    Labs Reviewed  CBC - Abnormal; Notable for the following:    RBC 3.72 (*)    MCV 100.5 (*)    All other components within normal limits  BASIC METABOLIC PANEL  URINALYSIS, ROUTINE W REFLEX MICROSCOPIC      Date: 09/21/2011  Rate: 68  Rhythm: normal sinus rhythm  QRS Axis: left  Intervals: PR  prolonged   ST/T Wave abnormalities: nonspecific ST changes  Conduction Disutrbances:first-degree A-V block  and left bundle branch block  Narrative Interpretation:   Old EKG Reviewed: unchanged    MDM     I personally performed the services described in this documentation, which was scribed in my presence. The recorded information has been reviewed and considered.          [name] Gaskins, MD 09/21/11 [phone]

## 2011-09-22 LAB — BASIC METABOLIC PANEL
CO2: 27 mEq/L (ref 19–32)
Chloride: 105 mEq/L (ref 96–112)
GFR calc Af Amer: 32 mL/min — ABNORMAL LOW (ref >90)
Potassium: 3.9 mEq/L (ref 3.5–5.1)
Sodium: 138 mEq/L (ref 135–145)

## 2011-09-22 LAB — GLUCOSE, CAPILLARY
Glucose-Capillary: 106 mg/dL — ABNORMAL HIGH (ref 70–99)
Glucose-Capillary: 119 mg/dL — ABNORMAL HIGH (ref 70–99)

## 2011-09-22 LAB — CBC
HCT: 36.3 % (ref 36.0–46.0)
Hemoglobin: 11.7 g/dL — ABNORMAL LOW (ref 12.0–15.0)
MCV: 101.1 fL — ABNORMAL HIGH (ref 78.0–100.0)
RBC: 3.59 MIL/uL — ABNORMAL LOW (ref 3.87–5.11)
WBC: 10.5 10*3/uL (ref 4.0–10.5)

## 2011-09-22 MED ORDER — LABETALOL HCL 200 MG PO TABS: 100.0000 mg | ORAL_TABLET | Freq: Two times a day (BID) | ORAL | Status: DC | PRN

## 2011-09-22 MED ORDER — HALOPERIDOL 2 MG PO TABS
2.5000 mg | ORAL_TABLET | Freq: Once | ORAL | Status: AC
  Administered 2011-09-22: 2.5 mg via ORAL
  Filled 2011-09-22: qty 1

## 2011-09-22 MED ORDER — OXYCODONE HCL 5 MG PO TABS
5.0000 mg | ORAL_TABLET | Freq: Four times a day (QID) | ORAL | Status: DC | PRN
  Administered 2011-09-22 – 2011-09-24 (×5): 5 mg via ORAL
  Filled 2011-09-22 (×6): qty 1

## 2011-09-22 NOTE — Progress Notes (Signed)
Subjective: Currently not complaining of any pain. Per daughter, pt was a little agitated and needed some Xanax but is now doing much better.   Objective: Patient Vitals for the past 24 hrs:  BP Temp Temp src Pulse Resp SpO2 Height Weight  09/22/11 1358 140/75 mmHg 98.4 F (36.9 C) - 69  18  97 % - -  09/22/11 0911 - - - - - - 5\' 1"  (1.549 m) 81.693 kg (180 lb 1.6 oz)  09/22/11 0500 150/66 mmHg 98.3 F (36.8 C) Oral 70  18  96 % - -  09/21/11 2127 155/68 mmHg 97.6 F (36.4 C) Oral 64  16  92 % - 83.099 kg (183 lb 3.2 oz)  09/21/11 2030 177/69 mmHg - - - 19  - - -  09/21/11 1926 178/98 mmHg - - 59  - 100 % - -  09/21/11 1915 178/96 mmHg - - 56  15  98 % - -  09/21/11 1834 181/61 mmHg 98 F (36.7 C) Oral 65  - 97 % - -  09/21/11 1534 176/66 mmHg 97.4 F (36.3 C) Axillary - 20  99 % - -   Weight change:   Intake/Output Summary (Last 24 hours) at 09/22/11 1513 Last data filed at 09/22/11 0647  Gross per 24 hour  Intake    820 ml  Output      0 ml  Net    820 ml    Physical Exam: BP 140/75  Pulse 69  Temp(Src) 98.4 F (36.9 C) (Oral)  Resp 18  Ht 5\' 1"  (1.549 m)  Wt 81.693 kg (180 lb 1.6 oz)  BMI 34.03 kg/m2  SpO2 97%  General Appearance:    Alert, cooperative, no distress, appears stated age  Head:    Normocephalic, without obvious abnormality, atraumatic  Eyes:    PERRL, conjunctiva/corneas clear, EOM's intact, fundi    benign, both eyes           Neck:   Supple,no adenopathy;    thyroid:  no enlargement/tenderness/nodules; no carotid   bruit or JVD     Lungs:     Clear to auscultation bilaterally, respirations unlabored      Heart:    Regular rate and rhythm, S1 and S2 normal, no murmur, rub   or gallop  Breast Exam:    No tenderness, masses, or nipple abnormality  Abdomen:     Soft, non-tender, bowel sounds active all four quadrants,    no masses, no organomegaly        Extremities:   Extremities normal, atraumatic, no cyanosis or edema  Pulses:   2+ and  symmetric all extremities  Skin:   Skin color, texture, turgor normal, no rashes or lesions          Lab Results:  [facility] 09/22/11 0447 09/21/11 1327  NA 138 139  K 3.9 4.0  CL 105 105  CO2 27 24  GLUCOSE 126* 161*  BUN 27* 29*  CREATININE 1.65* 1.25*  CALCIUM 8.5 9.7  MG -- --  PHOS -- --   No results found for this basename: AST:2,ALT:2,ALKPHOS:2,BILITOT:2,PROT:2,ALBUMIN:2 in the last 72 hours No results found for this basename: LIPASE:2,AMYLASE:2 in the last 72 hours  Basename 09/22/11 0447 09/21/11 1327  WBC 10.5 8.0  NEUTROABS -- --  HGB 11.7* 12.2  HCT 36.3 37.4  MCV 101.1* 100.5*  PLT 194 203   No results found for this basename: CKTOTAL:3,CKMB:3,CKMBINDEX:3,TROPONINI:3 in the last 72 hours No results found for this  basename: POCBNP:3 in the last 72 hours No results found for this basename: DDIMER:2 in the last 72 hours No results found for this basename: HGBA1C:2 in the last 72 hours No results found for this basename: CHOL:2,HDL:2,LDLCALC:2,TRIG:2,CHOLHDL:2,LDLDIRECT:2 in the last 72 hours No results found for this basename: TSH,T4TOTAL,FREET3,T3FREE,THYROIDAB in the last 72 hours No results found for this basename: VITAMINB12:2,FOLATE:2,FERRITIN:2,TIBC:2,IRON:2,RETICCTPCT:2 in the last 72 hours  Micro Results: No results found for this or any previous visit (from the past 240 hour(s)).  Studies/Results: Ct Abdomen Pelvis W Contrast  09/21/2011  *RADIOLOGY REPORT*  Clinical Data: 77 year old with right lower quadrant pain.  CT ABDOMEN AND PELVIS WITH CONTRAST  Technique:  Multidetector CT imaging of the abdomen and pelvis was performed following the standard protocol during bolus administration of intravenous contrast.  Contrast: 50mL OMNIPAQUE IOHEXOL 300 MG/ML IV SOLN  Comparison: None.  Findings: There are coronary artery calcifications.  There is a hiatal hernia containing oral contrast.  There is massive dilatation of the right renal pelvis and renal  calyces.  There is fluid surrounding the right kidney.  There is a large stone at the right ureteropelvic junction measuring up to 1.2 cm.  There is an additional stone in the posterior right renal pelvis measuring 8 mm.  Punctate stone in the right kidney upper pole.  There is marked irregularity of the right kidney mid/lower pole.  This may represent a combination of complex cystic structures containing calcifications and calyceal dilatation.  The partially calcified cystic structure roughly measure 4.9 x 2.6 cm.  There is a comma shaped stone in the left kidney lower pole that roughly measures 1 cm.  No evidence for left hydronephrosis.  There is no evidence for ureter stones.  Slightly increased density in the distal left ureter but no evidence for left ureter dilatation or obstruction.  No gross abnormality to the urinary bladder.  The uterus has been removed.  Normal appearance of the liver and portal venous system.  Mild dilatation of the intrahepatic bile ducts probably related to a cholecystectomy.  No gross abnormality to the pancreas or spleen.  No evidence for lymphadenopathy or pelvic free fluid.  There is a tiny ventral hernia containing fat.  Appendix appears to be normal.  Multilevel degenerative changes in the lower lumbar spine.  There is volume loss at the lung bases.  No evidence for free air.  IMPRESSION: Severe right hydronephrosis due to a 1.2 cm stone at the right ureteropelvic junction.  There is fluid and stranding around the right kidney. There is also an 0.8 cm stone in the right renal pelvis.  Complex cystic structure in the right kidney mid/lower pole.  This area is difficult to evaluate due to the adjacent hydronephrosis. Findings are concerning for a complex cystic lesion.  Recommend further evaluation to exclude a cystic neoplasm.  Recommend pre and post contrast MRI of the kidneys after the right hydronephrosis has been resolved.  Nonobstructive 1 cm left kidney stone.  Hiatal  hernia.  These results were called by telephone on 09/21/2011  at  4:50 p.m. to  Dr. Adriana Simas, who verbally acknowledged these results.  Original Report Authenticated By: Richarda Overlie, M.D.    Medications: Scheduled Meds:   . aspirin  81 mg Oral Daily  . atropine  1 drop Right Eye TID  . citalopram  10 mg Oral Daily  . Difluprednate  1 drop Right Eye TID  . docusate sodium  100 mg Oral BID  . heparin  5,000 Units Subcutaneous  Q8H  . memantine  10 mg Oral BID  . ondansetron      . pantoprazole  40 mg Oral Q1200  . promethazine  12.5 mg Intravenous Once  . simvastatin  5 mg Oral q1800  . sodium chloride  250 mL Intravenous Once  . sucralfate  1 g Oral QID  . Tamsulosin HCl  0.4 mg Oral Daily  . DISCONTD: aspirin  81 mg Oral Daily   Continuous Infusions:   . sodium chloride 100 mL/hr at 09/22/11 0913   PRN Meds:.acetaminophen, acetaminophen, ALPRAZolam, iohexol, labetalol, ondansetron (ZOFRAN) IV, ondansetron, oxyCODONE, promethazine, promethazine, senna  Assessment/Plan: 86yoF with h/o Parkinson's vs Alzheimer's dementia, living at [facility], CAD with 60% D1, HTN / HL / DM not on insulin, retinal artery occlusion, h/o nephrolithiasis, GERD/hiatal hernia presents with R flank pain and found to have 12mm R ureteropelvic junction stone, complex cystic kidney lesion, mild AKI.   1. R ureteropelvic jxn 1.2 cm stone with associated severe R hydronephrosis. Also with 0.8 cm right renal pelvis stone. No associated WBC count, fevers, or hemodynamic instability to suggest urosepsis/infectious complication. UA shows no signs of infection.  - No ABx for now, monitor WBC's, fevers, etc.  - Urology aware- will get ureteral stenting tomorrow - Pain and nausea control  - Tamsulosin 0.4 mg daily, although doubt it will pass stone   2. R kidney mid/lower pole complex cystic lesion: Would need follow up with MR imaging to r/o renal cell ca once acute illness resolved.   3. Acute kidney injury: worse  today- Trend BMET's, holding home Lasix, Losartan  4. Dementia: home Xanax PRN for agitation. Continue Namenda, celexa  5. HTN / HL / DM: home ASA 81 daily, statin alternative. Not previously on DM meds, will just monitor CBG's for now, hold on SSI. No previous HTN meds, would monitor for now and treat with PO Labetalol for SBP >160  6. GERD: home PPI, sucralfate  Pt is DNR as discussed with family. They state previous paperwork has been filled out.      LOS: 1 day   [facility] ANWAR 09/22/2011, 3:13 PM

## 2011-09-22 NOTE — Progress Notes (Signed)
UR Chart Review Completed  

## 2011-09-22 NOTE — Progress Notes (Signed)
Pt is too ill to be appropriate for a PT eval.   Once immediate problem is resolved, we would be happy to see her if it is needed.  Will d/c service at this time.

## 2011-09-23 ENCOUNTER — Inpatient Hospital Stay (HOSPITAL_COMMUNITY): Payer: Medicare Other

## 2011-09-23 ENCOUNTER — Encounter (HOSPITAL_COMMUNITY): Payer: Self-pay | Admitting: Anesthesiology

## 2011-09-23 ENCOUNTER — Encounter (HOSPITAL_COMMUNITY): Payer: Self-pay | Admitting: *Deleted

## 2011-09-23 ENCOUNTER — Encounter (HOSPITAL_COMMUNITY)
Admission: EM | Disposition: A | Discharge status: Nursing Home (Includes ICF) | Payer: Self-pay | Source: Home / Self Care | Attending: Internal Medicine

## 2011-09-23 ENCOUNTER — Inpatient Hospital Stay (HOSPITAL_COMMUNITY): Payer: Medicare Other | Admitting: Anesthesiology

## 2011-09-23 HISTORY — PX: CYSTOSCOPY W/ URETERAL STENT PLACEMENT: SHX1429

## 2011-09-23 LAB — GLUCOSE, CAPILLARY

## 2011-09-23 LAB — CBC
Hemoglobin: 10.9 g/dL — ABNORMAL LOW (ref 12.0–15.0)
Platelets: 153 10*3/uL (ref 150–400)
RBC: 3.32 MIL/uL — ABNORMAL LOW (ref 3.87–5.11)

## 2011-09-23 LAB — BASIC METABOLIC PANEL
Calcium: 8.1 mg/dL — ABNORMAL LOW (ref 8.4–10.5)
GFR calc non Af Amer: 26 mL/min — ABNORMAL LOW (ref >90)
Sodium: 139 mEq/L (ref 135–145)

## 2011-09-23 SURGERY — Surgical Case
Anesthesia: *Unknown

## 2011-09-23 SURGERY — CYSTOSCOPY, WITH RETROGRADE PYELOGRAM AND URETERAL STENT INSERTION
Anesthesia: General | Site: Urethra | Laterality: Right | Wound class: Clean Contaminated

## 2011-09-23 MED ORDER — MIDAZOLAM HCL 2 MG/2ML IJ SOLN
INTRAMUSCULAR | Status: AC
  Filled 2011-09-23: qty 2

## 2011-09-23 MED ORDER — SODIUM CHLORIDE 0.45 % IV SOLN
INTRAVENOUS | Status: DC
  Administered 2011-09-23 – 2011-09-24 (×2): via INTRAVENOUS

## 2011-09-23 MED ORDER — BUPIVACAINE IN DEXTROSE 0.75-8.25 % IT SOLN
INTRATHECAL | Status: AC
  Filled 2011-09-23: qty 2

## 2011-09-23 MED ORDER — ONDANSETRON HCL 4 MG/2ML IJ SOLN: 4.0000 mg | Freq: Once | INTRAMUSCULAR | Status: DC | PRN

## 2011-09-23 MED ORDER — BUPIVACAINE HCL 0.75 % IJ SOLN
INTRAMUSCULAR | Status: DC | PRN
  Administered 2011-09-23: 11.25 mg via INTRATHECAL

## 2011-09-23 MED ORDER — FENTANYL CITRATE 0.05 MG/ML IJ SOLN: 25.0000 ug | INTRAMUSCULAR | Status: DC | PRN

## 2011-09-23 MED ORDER — CIPROFLOXACIN IN D5W 200 MG/100ML IV SOLN
INTRAVENOUS | Status: AC
  Filled 2011-09-23: qty 100

## 2011-09-23 MED ORDER — CIPROFLOXACIN IN D5W 200 MG/100ML IV SOLN
200.0000 mg | Freq: Once | INTRAVENOUS | Status: AC
  Administered 2011-09-23: 200 mg via INTRAVENOUS

## 2011-09-23 MED ORDER — FENTANYL CITRATE 0.05 MG/ML IJ SOLN
INTRAMUSCULAR | Status: DC | PRN
  Administered 2011-09-23: 12.5 ug via INTRATHECAL

## 2011-09-23 MED ORDER — LACTATED RINGERS IV SOLN
INTRAVENOUS | Status: DC
  Administered 2011-09-23: 10:00:00 via INTRAVENOUS

## 2011-09-23 MED ORDER — FENTANYL CITRATE 0.05 MG/ML IJ SOLN
INTRAMUSCULAR | Status: DC | PRN
  Administered 2011-09-23: 12.5 ug via INTRAVENOUS

## 2011-09-23 MED ORDER — PROPOFOL 10 MG/ML IV EMUL
INTRAVENOUS | Status: DC | PRN
  Administered 2011-09-23: 25 ug/kg/min via INTRAVENOUS

## 2011-09-23 MED ORDER — SODIUM CHLORIDE 0.9 % IR SOLN
Status: DC | PRN
  Administered 2011-09-23: 6000 mL

## 2011-09-23 MED ORDER — ACETAMINOPHEN 325 MG PO TABS: 325.0000 mg | ORAL_TABLET | ORAL | Status: DC | PRN

## 2011-09-23 MED ORDER — LIDOCAINE HCL (PF) 1 % IJ SOLN
INTRAMUSCULAR | Status: AC
  Filled 2011-09-23: qty 5

## 2011-09-23 MED ORDER — PROPOFOL 10 MG/ML IV EMUL
INTRAVENOUS | Status: AC
  Filled 2011-09-23: qty 20

## 2011-09-23 MED ORDER — MIDAZOLAM HCL 2 MG/2ML IJ SOLN
1.0000 mg | INTRAMUSCULAR | Status: DC | PRN
  Administered 2011-09-23 (×2): 2 mg via INTRAVENOUS

## 2011-09-23 MED ORDER — IOHEXOL 350 MG/ML SOLN
INTRAVENOUS | Status: DC | PRN
  Administered 2011-09-23: 50 mL

## 2011-09-23 MED ORDER — LIDOCAINE HCL 1 % IJ SOLN
INTRAMUSCULAR | Status: DC | PRN
  Administered 2011-09-23: 2 mg

## 2011-09-23 MED ORDER — FENTANYL CITRATE 0.05 MG/ML IJ SOLN
INTRAMUSCULAR | Status: AC
  Filled 2011-09-23: qty 2

## 2011-09-23 SURGICAL SUPPLY — 20 items
BAG DRAIN URO TABLE W/ADPT NS (DRAPE) ×2 IMPLANT
CATH 5 FR WEDGE TIP (UROLOGICAL SUPPLIES) ×2 IMPLANT
CATH OPEN TIP 5FR (CATHETERS) ×2 IMPLANT
CLOTH BEACON ORANGE TIMEOUT ST (SAFETY) ×2 IMPLANT
DILATOR UROMAX ULTRA (MISCELLANEOUS) IMPLANT
FLOOR PAD 36X40 (MISCELLANEOUS)
GLOVE BIO SURGEON STRL SZ7 (GLOVE) ×2 IMPLANT
GOWN BRE IMP SLV AUR XL STRL (GOWN DISPOSABLE) ×2 IMPLANT
IV NS IRRIG 3000ML ARTHROMATIC (IV SOLUTION) ×4 IMPLANT
KIT ROOM TURNOVER AP CYSTO (KITS) ×2 IMPLANT
LASER FIBER DISP (UROLOGICAL SUPPLIES) IMPLANT
LASER FIBER DISP 1000U (UROLOGICAL SUPPLIES) IMPLANT
MANIFOLD NEPTUNE II (INSTRUMENTS) ×2 IMPLANT
PACK CYSTO (CUSTOM PROCEDURE TRAY) ×2 IMPLANT
PAD ARMBOARD 7.5X6 YLW CONV (MISCELLANEOUS) ×2 IMPLANT
PAD FLOOR 36X40 (MISCELLANEOUS) IMPLANT
STENT PERCUFLEX 4.8FRX24 (STENTS) ×2 IMPLANT
STONE RETRIEVAL GEMINI 2.4 FR (MISCELLANEOUS) IMPLANT
TOWEL OR 17X26 4PK STRL BLUE (TOWEL DISPOSABLE) ×2 IMPLANT
WIRE GUIDE BENTSON .035 15CM (WIRE) ×2 IMPLANT

## 2011-09-23 NOTE — Discharge Summary (Deleted)
NAMEJANNIS, ATKINS NO.:  [id number]  MEDICAL RECORD NO.:  [id number]  LOCATION:  [facility]                          FACILITY:  [facility]  PHYSICIAN:  Ky Barban, M.D.DATE OF BIRTH:  [birth year]  DATE OF ADMISSION:  09/21/2011 DATE OF DISCHARGE:  LH                         DISCHARGE SUMMARY-REFERRING   This is an 77 year old female who has Alzheimer's and multiple other medical problems, is admitted primarily on the Hospitalist Service, presented in the emergency room with severe pain in her right flank with nausea, vomiting.  No fever or chills.  According to the family, she has history of having urinary tract infections, has taken antibiotic for some time, but she was not getting better.  She was not running any fever, and since she came to the hospital it does not look like she has any pain.  CT scan done in the emergency room showed that she has a 1.2 cm stone in the right ureteropelvic junction causing severe right hydronephrosis.  History is obtained from the family.  The patient is not able to give any history.  I obtained most of the history from the patient's record and from the family.  She was admitted for further management and possible insertion of double-J stent.  PAST MEDICAL HISTORY: 1. Arteriosclerotic cardiovascular disease. 2. Hypertension. 3. Hyperlipidemia. 4. Diabetes mellitus, non-insulin. 5. Leukopenia. 6. Degenerative joint disease. 7. Upper GI bleed. 8. 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 9. Sudden onset of right eye blindness, embolism suspected. 10.GE reflux with hiatal hernia. 11.Osteoporosis. 12.Lung nodule left lower lobe. 13.Parkinson disease. 14.Dementia. 15.Cervical spondylitis status code diskectomy. 16.Alzheimer disease.  PAST SURGICAL HISTORY:  Total abdominal hysterectomy, cervical diskectomy, cholecystectomy, total knee arthroplasty, colonoscopy, joint replacement, and vascular surgery.  On  examination, moderately built female not in acute distress.  Blood pressure is 177/69, temperature 98.8.  Abdomen is soft, flat.  Liver, spleen, and kidneys not palpable.  No CVA tenderness.  Pelvic exam was deferred.  LABORATORY DATA:  WBC count is 8000, hematocrit 37.4.  Sodium 139, potassium 4, chloride 105, CO2 is 24, glucose 161, BUN is 29, creatinine 1.25, calcium 9.7.  Urinalysis nitrite negative, leukocyte negative.  IMPRESSION:  Right renal calculus with severe hydronephrosis.  PLANS:  Cystoscopy, insertion of double-J stent on the right side under anesthesia.     Ky Barban, M.D.     MIJ/MEDQ  D:  09/23/2011  T:  09/23/2011  Job:  [id number]

## 2011-09-23 NOTE — Anesthesia Preprocedure Evaluation (Signed)
Anesthesia Evaluation  Patient identified by MRN, date of birth, ID band Patient awake and Patient confused    Reviewed: Allergy & Precautions, H&P , NPO status , Patient's Chart, lab work & pertinent test results  History of Anesthesia Complications Negative for: history of anesthetic complications  Airway Mallampati: I TM Distance: >3 FB Neck ROM: Full    Dental  (+) Teeth Intact   Pulmonary neg pulmonary ROS,    Pulmonary exam normal       Cardiovascular hypertension, Pt. on medications Regular Normal    Neuro/Psych PSYCHIATRIC DISORDERS  Neuromuscular disease (Parkinson's disease, Alzheimers dementia)    GI/Hepatic Neg liver ROS, GERD-  Medicated and Controlled,  Endo/Other  Diabetes mellitus-, Type 2  Renal/GU      Musculoskeletal  (+) Arthritis -, Osteoarthritis,    Abdominal Normal abdominal exam  (+)   Peds  Hematology  (+) Blood dyscrasia, anemia ,   Anesthesia Other Findings   Reproductive/Obstetrics negative OB ROS                           Anesthesia Physical Anesthesia Plan  ASA: III  Anesthesia Plan: Spinal   Post-op Pain Management:    Induction: Intravenous  Airway Management Planned: Simple Face Mask  Additional Equipment:   Intra-op Plan:   Post-operative Plan:   Informed Consent: I have reviewed the patients History and Physical, chart, labs and discussed the procedure including the risks, benefits and alternatives for the proposed anesthesia with the patient or authorized representative who has indicated his/her understanding and acceptance.     Plan Discussed with: CRNA  Anesthesia Plan Comments:         Anesthesia Quick Evaluation

## 2011-09-23 NOTE — Discharge Summary (Signed)
DISCHARGE SUMMARY  OTHELLO SGROI  MR#: [id number]  DOB:[birth year]  Date of Admission: 09/21/2011 Date of Discharge: 09/24/2011  Attending Physician:Jacquan Savas Linna Darner  Patient's FAO:ZHYQMV,HQION, MD, MD  Consults:Treatment Team:  Ky Barban- urology  Presenting Complaint Right flank pain  Discharge Diagnoses: NEPHROLITHIASIS Acute kidney injury Complex Renal Cyst  PMH: .PARKINSON'S DISEASE .HYPERLIPIDEMIA .HYPERTENSION .Diabetes mellitus    Discharge Medications: Will be dictated upon discharge    Imaging: Ct Abdomen Pelvis W Contrast 09/21/2011    IMPRESSION: Severe right hydronephrosis due to a 1.2 cm stone at the right ureteropelvic junction.  There is fluid and stranding around the right kidney. There is also an 0.8 cm stone in the right renal pelvis.  Complex cystic structure in the right kidney mid/lower pole.  This area is difficult to evaluate due to the adjacent hydronephrosis. Findings are concerning for a complex cystic lesion.  Recommend further evaluation to exclude a cystic neoplasm.  Recommend pre and post contrast MRI of the kidneys after the right hydronephrosis has been resolved.  Nonobstructive 1 cm left kidney stone.  Hiatal hernia.  These results were called by telephone on 09/21/2011  at  4:50 p.m. to  Dr. Adriana Simas, who verbally acknowledged these results.  Original Report Authenticated By: Richarda Overlie, M.D.   Dg Retrograde Pyelogram /12/2010  IMPRESSION: Intraoperative right-sided retrograde pyelogram with right-sided double-J ureteral stent placement as above.  Original Report Authenticated By: Waynard Reeds, M.D.    Hospital Course: 1. NEPHROLITHIASIS- is an 77 year old female who presented to the hospital with right-sided flank pain. She was found to have a right ureteropelvic junction stone measuring 1.2 cm with severe hydronephrosis. The patient was admitted and pain control was initiated. The following day urology consult was requested.  She was evaluated by Dr. Jerre Simon who performed a cystoscopy on 09/23/2011 and inserted a ureteral stent.  Postop the patient is doing well. She is expected to be discharged on 09/24/2011 if she continues to be stable. Her pain and her vomiting have resolved and she is tolerating a regular diet.  2. Acute kidney injury-is likely due to obstruction of the ureter from the stone. It should hopefully improve. Creatinine today is 1.74.  3. Complex Renal Cyst-this was discovered on a CT scan performed during this hospital admission. It needs to be further evaluated with an MRI of the kidneys after the hydronephrosis has been resolved.    Day of Discharge BP 119/74  Pulse 73  Temp(Src) 98.4 F (36.9 C) (Oral)  Resp 20  Ht 5\' 1"  (1.549 m)  Wt 81.693 kg (180 lb 1.6 oz)  BMI 34.03 kg/m2  SpO2 90%  Physical Exam: GEX:BMWUX and alert but disoriented Lungs: Clear to auscultation bilaterally Heart: Regular rate and rhythm no murmurs rubs or GALLOPS Abdomen: Soft nontender nondistended bowel sounds positive Extremities: No cyanosis clubbing or edema.    Disposition:    Follow-up Appts: Discharge Orders    Future Appointments: Provider: Department: Dept Phone: Center:   11/01/2011 2:30 PM Llana Aliment, NP [facility] Lionel December [phone] None         Signed: Joya Salm 09/23/2011, 4:15 PM

## 2011-09-23 NOTE — Brief Op Note (Signed)
09/21/2011 - 09/23/2011  12:05 PM  PATIENT:  [name] [name]  77 y.o. female  PRE-OPERATIVE DIAGNOSIS:  Right Nephrolithiasis  POST-OPERATIVE DIAGNOSIS:  Right Nephrolithiasis  PROCEDURE:  Procedure(s): CYSTOSCOPY WITH RETROGRADE PYELOGRAM/URETERAL STENT PLACEMENT no string  SURGEON:  Surgeon(s): Ky Barban  PHYSICIAN ASSISTANT:   ASSISTANTS: none   ANESTHESIA:   spinal  EBL:  Total I/O In: 400 [I.V.:400] Out: -   BLOOD ADMINISTERED:none  DRAINS: none   LOCAL MEDICATIONS USED:  NONE  SPECIMEN:  No Specimen  DISPOSITION OF SPECIMEN:  N/A  COUNTS:  YES  TOURNIQUET:  * No tourniquets in log *  DICTATION: .Other Dictation: Dictation Number [id number]  PLAN OF CARE: Admit for overnight observation  PATIENT DISPOSITION:  PACU - hemodynamically stable.   Delay start of Pharmacological VTE agent (>24hrs) due to surgical blood loss or risk of bleeding:  not applicable

## 2011-09-23 NOTE — Progress Notes (Signed)
Report#[id number]

## 2011-09-23 NOTE — Anesthesia Procedure Notes (Addendum)
Spinal Block  Start time: 09/23/2011 11:30 AM End time: 09/23/2011 11:36 AM Staffing CRNA/Resident: Minerva Areola Preanesthetic Checklist Completed: patient identified, site marked, surgical consent, pre-op evaluation, timeout performed, IV checked, risks and benefits discussed and monitors and equipment checked Spinal Block Patient position: right lateral decubitus Prep: Betadine and prep x 3 Patient monitoring: heart rate, cardiac monitor, continuous pulse ox and blood pressure Approach: right paramedian Location: L3-4 Injection technique: single-shot Needle Needle type: Spinocan  Needle gauge: 22 G Needle length: 9 cm Assessment Sensory level: T8 (level at 1148) Events: clear CSF pre and post injection Additional Notes Skin localization 1% Lidocaine at L3-4 paramedian 2cc Marcaine 15 mg/ Fentanyl 12.5 mcg  Tray Lot [id number] Tray expiration: 2013-09  Date/Time: 09/23/2011 11:15 AM Performed by: Minerva Areola Pre-anesthesia Checklist: Patient identified, Patient being monitored, Emergency Drugs available, Timeout performed and Suction available Patient Re-evaluated:Patient Re-evaluated prior to inductionOxygen Delivery Method: Nasal Cannula

## 2011-09-23 NOTE — Transfer of Care (Signed)
Immediate Anesthesia Transfer of Care Note  Patient: [name] [name]  Procedure(s) Performed:  CYSTOSCOPY WITH RETROGRADE PYELOGRAM/URETERAL STENT PLACEMENT  Patient Location: PACU  Anesthesia Type: SAB  Level of Consciousness: awake  Airway & Oxygen Therapy: Patient Spontanous Breathing and nasal cannula Post-op Assessment: Report given to PACU RN, Post -op Vital signs reviewed and stable. SAB Level  T 10  Post vital signs: Reviewed and stable  Complications: No apparent anesthesia complications

## 2011-09-23 NOTE — Consults (Signed)
Consult [phone]

## 2011-09-23 NOTE — Anesthesia Postprocedure Evaluation (Signed)
Anesthesia Post Note  Patient: [name] [name]  Procedure(s) Performed:  CYSTOSCOPY WITH RETROGRADE PYELOGRAM/URETERAL STENT PLACEMENT  Anesthesia type: Spinal  Patient location: PACU  Post pain: Pain level controlled  Post assessment: Post-op Vital signs reviewed, Patient's Cardiovascular Status Stable, Respiratory Function Stable, Patent Airway, No signs of Nausea or vomiting and Pain level controlled  Last Vitals:  Filed Vitals:   09/23/11 1215  BP: 112/30  Pulse: 47  Temp: 36.9 C  Resp: 20    Post vital signs: Reviewed and stable  Level of consciousness: awake and alert   Complications: No apparent anesthesia complications

## 2011-09-24 LAB — BASIC METABOLIC PANEL
CO2: 23 mEq/L (ref 19–32)
Chloride: 107 mEq/L (ref 96–112)
Creatinine, Ser: 1.12 mg/dL — ABNORMAL HIGH (ref 0.50–1.10)
GFR calc Af Amer: 51 mL/min — ABNORMAL LOW (ref >90)
Potassium: 3.6 mEq/L (ref 3.5–5.1)

## 2011-09-24 LAB — CBC
HCT: 31.9 % — ABNORMAL LOW (ref 36.0–46.0)
Hemoglobin: 10.4 g/dL — ABNORMAL LOW (ref 12.0–15.0)
MCV: 101.3 fL — ABNORMAL HIGH (ref 78.0–100.0)
RBC: 3.15 MIL/uL — ABNORMAL LOW (ref 3.87–5.11)
RDW: 13.1 % (ref 11.5–15.5)
WBC: 5.7 10*3/uL (ref 4.0–10.5)

## 2011-09-24 MED ORDER — SODIUM CHLORIDE 0.9 % IJ SOLN
INTRAMUSCULAR | Status: AC
  Administered 2011-09-24: 10 mL
  Filled 2011-09-24: qty 3

## 2011-09-24 MED ORDER — TAMSULOSIN HCL 0.4 MG PO CAPS: 0.4000 mg | ORAL_CAPSULE | Freq: Every day | ORAL | Status: DC

## 2011-09-24 NOTE — Op Note (Signed)
[name], [name] [name] NO.:  [id number]  MEDICAL RECORD NO.:  [id number]  LOCATION:  A303                          FACILITY:  [facility]  PHYSICIAN:  Ky Barban, M.D.DATE OF BIRTH:  [birth year]  DATE OF PROCEDURE:  09/23/2011 DATE OF DISCHARGE:                              OPERATIVE REPORT   PREOPERATIVE DIAGNOSES:  Right renal pelvic stone, right hydronephrosis.  POSTOPERATIVE DIAGNOSES:  Right renal pelvic stone, right hydronephrosis.  PROCEDURE:  Cystoscopy, right retrograde pyelogram, insertion of double- J stent, size 5-French 24-cm, no string attached.  ANESTHESIA:  Spinal.  PROCEDURE:  The patient under spinal anesthesia in lithotomy position. After usual prep and drape #25 cystoscope introduced into the bladder. It was inspected.  I see lot of crystal deposits on the trigone, otherwise no stone, foreign body or inflammation seen in the bladder. The right ureteral orifice, I inserted a guidewire all the way up into the renal pelvis.  I can see a calcification in the area of the renal pelvis.  Over the renal pelvis, introduced open-end catheter all the way in the renal pelvis.  The guidewire was removed and I was not getting any hydronephrotic drip, so I injected about 3 mL of Hypaque solution to outline the renal pelvis, which was outline.  The calices are not clubbed shape.  Renal pelvis appears to be dilated and there is a filling defect in the renal pelvis.  It looks like the obstruction has been relieved, but stone is still there, so I decided to put a double-J stent.  The guidewire was reintroduced and the open-end catheter was removed.  Over the guidewire, a 5-French 24-cm double-J stent was positioned within the renal pelvis and the bladder.  All the instruments were removed.  The guidewire has been removed.  A nice loop in the renal pelvis and the bladder was obtained.  It was difficult to see the stone because there is dye in the colon from  previous CT scan, and I will have to wait few days to take a KUB to see if the stone is visible or not. All the instruments were removed.  The patient left the operating room in satisfactory condition.     Ky Barban, M.D.     MIJ/MEDQ  D:  09/23/2011  T:  09/24/2011  Job:  [id number]

## 2011-09-24 NOTE — Addendum Note (Signed)
Addendum  created 09/24/11 1029 by Minerva Areola, CRNA   Modules edited:Notes Section

## 2011-09-24 NOTE — Progress Notes (Signed)
Patient discharged to assisted living facility. Report called. Patient discharged in stable condition.

## 2011-09-24 NOTE — Progress Notes (Signed)
Pt to D/C today to [facility].  CSW spoke with Pt and her daughter and with Karmen at facility.  All are in agreement with D/C plan.  Pt to be transported by daughter.  CSW will sign off at this time.

## 2011-09-24 NOTE — Discharge Summary (Signed)
Physician Discharge Summary  Patient ID: [name] [name] MRN: [id number] DOB/AGE: [id number] 75 y.o. Primary Care Physician:LUKING,SCOTT, MD, MD Admit date: 09/21/2011 Discharge date: 09/24/2011    Discharge Diagnoses:  1. Nephrolithiasis, status post cystoscopy, right retrograde pyelogram, insertion of double-J stent. Dr. Jerre Simon. 2. Acute renal failure, improving. 3. Hypertension. 4. Parkinson's disease.   Current Discharge Medication List    START taking these medications   Details  Tamsulosin HCl (FLOMAX) 0.4 MG CAPS Take 1 capsule (0.4 mg total) by mouth daily. Qty: 30 capsule, Refills: 0      CONTINUE these medications which have NOT CHANGED   Details  acetaminophen (TYLENOL) 500 MG tablet Take 500 mg by mouth 2 (two) times daily. Or as needed for headache    ALPRAZolam (XANAX) 0.25 MG tablet Take 0.25 mg by mouth daily as needed. For agitation. **Not for frequent Use**     aspirin 81 MG tablet Take 81 mg by mouth daily.      atropine 1 % ophthalmic solution Place 1 drop into the right eye 3 (three) times daily.     citalopram (CELEXA) 20 MG tablet Take 10 mg by mouth daily. Take 1/2 tab    Difluprednate (DUREZOL) 0.05 % EMUL Place 1 drop into the right eye 3 (three) times daily.     docusate sodium ([employer] STOOL SOFTENER) 100 MG capsule Take 100 mg by mouth 2 (two) times daily.      loratadine (CLARITIN) 10 MG tablet Take 10 mg by mouth daily.      losartan (COZAAR) 25 MG tablet Take 25 mg by mouth daily.      memantine (NAMENDA) 10 MG tablet Take 10 mg by mouth 2 (two) times daily.      omeprazole (PRILOSEC) 40 MG capsule Take 40 mg by mouth 2 (two) times daily before a meal. Take 30 minutes before breakfast and supper     pravastatin (PRAVACHOL) 20 MG tablet Take 10 mg by mouth daily.      sucralfate (CARAFATE) 1 G tablet Take 1 g by mouth 4 (four) times daily.      Vitamin B Complex-C CAPS Take 1 capsule by mouth daily.     vitamin E 400 UNIT capsule Take  400 Units by mouth daily.      furosemide (LASIX) 20 MG tablet       STOP taking these medications     potassium chloride (K-DUR) 10 MEQ tablet         Discharged Condition: Stable and improved.    Consults: Urology, Dr. Jerre Simon.  Significant Diagnostic Studies: Ct Abdomen Pelvis W Contrast  09/21/2011  *RADIOLOGY REPORT*  Clinical Data: 77 year old with right lower quadrant pain.  CT ABDOMEN AND PELVIS WITH CONTRAST  Technique:  Multidetector CT imaging of the abdomen and pelvis was performed following the standard protocol during bolus administration of intravenous contrast.  Contrast: 50mL OMNIPAQUE IOHEXOL 300 MG/ML IV SOLN  Comparison: None.  Findings: There are coronary artery calcifications.  There is a hiatal hernia containing oral contrast.  There is massive dilatation of the right renal pelvis and renal calyces.  There is fluid surrounding the right kidney.  There is a large stone at the right ureteropelvic junction measuring up to 1.2 cm.  There is an additional stone in the posterior right renal pelvis measuring 8 mm.  Punctate stone in the right kidney upper pole.  There is marked irregularity of the right kidney mid/lower pole.  This may represent a combination of complex cystic  structures containing calcifications and calyceal dilatation.  The partially calcified cystic structure roughly measure 4.9 x 2.6 cm.  There is a comma shaped stone in the left kidney lower pole that roughly measures 1 cm.  No evidence for left hydronephrosis.  There is no evidence for ureter stones.  Slightly increased density in the distal left ureter but no evidence for left ureter dilatation or obstruction.  No gross abnormality to the urinary bladder.  The uterus has been removed.  Normal appearance of the liver and portal venous system.  Mild dilatation of the intrahepatic bile ducts probably related to a cholecystectomy.  No gross abnormality to the pancreas or spleen.  No evidence for lymphadenopathy  or pelvic free fluid.  There is a tiny ventral hernia containing fat.  Appendix appears to be normal.  Multilevel degenerative changes in the lower lumbar spine.  There is volume loss at the lung bases.  No evidence for free air.  IMPRESSION: Severe right hydronephrosis due to a 1.2 cm stone at the right ureteropelvic junction.  There is fluid and stranding around the right kidney. There is also an 0.8 cm stone in the right renal pelvis.  Complex cystic structure in the right kidney mid/lower pole.  This area is difficult to evaluate due to the adjacent hydronephrosis. Findings are concerning for a complex cystic lesion.  Recommend further evaluation to exclude a cystic neoplasm.  Recommend pre and post contrast MRI of the kidneys after the right hydronephrosis has been resolved.  Nonobstructive 1 cm left kidney stone.  Hiatal hernia.  These results were called by telephone on 09/21/2011  at  4:50 p.m. to  Dr. Adriana Simas, who verbally acknowledged these results.  Original Report Authenticated By: Richarda Overlie, M.D.   Dg Retrograde Pyelogram  09/23/2011  *RADIOLOGY REPORT*  Clinical Data: Right retrograde urogram with stent placement, no stones were removed  RETROGRADE PYELOGRAM  Comparison: CT of the abdomen and pelvis - 09/21/2011  Findings: Intraoperative images from a right retrograde urogram are provided for review.  Images demonstrate positioning of a right- sided double-J ureteral stent, ultimately with the superior coil positioned over the right renal pelvis and the inferior coil overlying the urinary bladder. Filling defects within the right renal pelvis likely represent the stones seen on prior abdominal CT, though the examination is degraded secondary to enteric contrast within the overlying ascending and transverse colon.  IMPRESSION: Intraoperative right-sided retrograde pyelogram with right-sided double-J ureteral stent placement as above.  Original Report Authenticated By: Waynard Reeds, M.D.    Lab  Results: Results for orders placed during the hospital encounter of 09/21/11 (from the past 48 hour(s))  GLUCOSE, CAPILLARY     Status: Abnormal   Collection Time   09/22/11 11:13 AM      Component Value Range Comment   Glucose-Capillary 106 (*) 70 - 99 (mg/dL)    Comment 1 Notify RN     GLUCOSE, CAPILLARY     Status: Abnormal   Collection Time   09/22/11  5:05 PM      Component Value Range Comment   Glucose-Capillary 119 (*) 70 - 99 (mg/dL)    Comment 1 Notify RN      Comment 2 Documented in Chart     GLUCOSE, CAPILLARY     Status: Abnormal   Collection Time   09/22/11  9:24 PM      Component Value Range Comment   Glucose-Capillary 216 (*) 70 - 99 (mg/dL)    Comment 1 Notify  RN      Comment 2 Documented in Chart     CBC     Status: Abnormal   Collection Time   09/23/11  4:50 AM      Component Value Range Comment   WBC 9.2  4.0 - 10.5 (K/uL)    RBC 3.32 (*) 3.87 - 5.11 (MIL/uL)    Hemoglobin 10.9 (*) 12.0 - 15.0 (g/dL)    HCT 04.5 (*) 40.9 - 46.0 (%)    MCV 101.8 (*) 78.0 - 100.0 (fL)    MCH 32.8  26.0 - 34.0 (pg)    MCHC 32.2  30.0 - 36.0 (g/dL)    RDW 81.1  91.4 - 78.2 (%)    Platelets 153  150 - 400 (K/uL)   BASIC METABOLIC PANEL     Status: Abnormal   Collection Time   09/23/11  4:50 AM      Component Value Range Comment   Sodium 139  135 - 145 (mEq/L)    Potassium 3.7  3.5 - 5.1 (mEq/L)    Chloride 108  96 - 112 (mEq/L)    CO2 24  19 - 32 (mEq/L)    Glucose, Bld 110 (*) 70 - 99 (mg/dL)    BUN 22  6 - 23 (mg/dL)    Creatinine, Ser 9.56 (*) 0.50 - 1.10 (mg/dL)    Calcium 8.1 (*) 8.4 - 10.5 (mg/dL)    GFR calc non Af Amer 26 (*) >90 (mL/min)    GFR calc Af Amer 30 (*) >90 (mL/min)   GLUCOSE, CAPILLARY     Status: Abnormal   Collection Time   09/23/11  7:36 AM      Component Value Range Comment   Glucose-Capillary 119 (*) 70 - 99 (mg/dL)    Comment 1 Notify RN     GLUCOSE, CAPILLARY     Status: Abnormal   Collection Time   09/23/11  4:53 PM      Component Value  Range Comment   Glucose-Capillary 120 (*) 70 - 99 (mg/dL)    Comment 1 Notify RN      Comment 2 Documented in Chart     BASIC METABOLIC PANEL     Status: Abnormal   Collection Time   09/24/11  5:01 AM      Component Value Range Comment   Sodium 138  135 - 145 (mEq/L)    Potassium 3.6  3.5 - 5.1 (mEq/L)    Chloride 107  96 - 112 (mEq/L)    CO2 23  19 - 32 (mEq/L)    Glucose, Bld 105 (*) 70 - 99 (mg/dL)    BUN 13  6 - 23 (mg/dL)    Creatinine, Ser 2.13 (*) 0.50 - 1.10 (mg/dL) DELTA CHECK NOTED   Calcium 8.4  8.4 - 10.5 (mg/dL)    GFR calc non Af Amer 44 (*) >90 (mL/min)    GFR calc Af Amer 51 (*) >90 (mL/min)   CBC     Status: Abnormal   Collection Time   09/24/11  5:01 AM      Component Value Range Comment   WBC 5.7  4.0 - 10.5 (K/uL)    RBC 3.15 (*) 3.87 - 5.11 (MIL/uL)    Hemoglobin 10.4 (*) 12.0 - 15.0 (g/dL)    HCT 08.6 (*) 57.8 - 46.0 (%)    MCV 101.3 (*) 78.0 - 100.0 (fL)    MCH 33.0  26.0 - 34.0 (pg)    MCHC 32.6  30.0 -  36.0 (g/dL)    RDW 40.9  81.1 - 91.4 (%)    Platelets 162  150 - 400 (K/uL)   GLUCOSE, CAPILLARY     Status: Abnormal   Collection Time   09/24/11  8:03 AM      Component Value Range Comment   Glucose-Capillary 103 (*) 70 - 99 (mg/dL)       Hospital Course: This 77 year old lady was admitted with right-sided flank pain. She was found to have a right ureteropelvic junction stone measuring 1.2 cm with severe hydronephrosis. The patient was admitted and pain control was initiated. The following day Dr. Jerre Simon, urology saw the patient and performed a cystoscopy with ureteral stent placement yesterday. Since this time she has done well and her renal function is improved.  Discharge Exam: Blood pressure 177/72, pulse 80, temperature 98.5 F (36.9 C), temperature source Oral, resp. rate 21, height 5\' 1"  (1.549 m), weight 88.6 kg (195 lb 5.2 oz), SpO2 96.00%. She looks systemically well. Heart sounds are present and normal. Lung fields are clear. She is alert  and orientated. There are no focal neurological signs.  Disposition: Stable for discharge to [facility]. She will need followup with Dr. Jerre Simon in approximately one to 2 weeks.  Discharge Orders    Future Appointments: Provider: Department: Dept Phone: Center:   11/01/2011 2:30 PM Llana Aliment, NP [facility] Lionel December [phone] None     Future Orders Please Complete By Expires   Diet - low sodium heart healthy      Increase activity slowly         Follow-up Information    Follow up with JAVAID,MOHAMMAD I. Make an appointment in 1 week.   Contact information:   [address] [place] [address] [phone]          Signed: Wilson Singer 09/24/2011, 11:05 AM

## 2011-09-24 NOTE — Progress Notes (Signed)
[name] [name], ABAYA NO.:  [id number]  MEDICAL RECORD NO.:  [id number]  LOCATION:  A303                          FACILITY:  [facility]  PHYSICIAN:  Ky Barban, M.D.DATE OF BIRTH:  1924-09-08  DATE OF PROCEDURE:  09/23/2011 DATE OF DISCHARGE:                                PROGRESS NOTE   HISTORY:  This 77 year old patient was admitted by the hospitalist, came to the emergency room with right renal colic.  CT scan showed there is 1.2 cm stone blocking the right UPJ with severe right hydronephrosis. She has multiple other problem which include diabetes, hypertension, coronary artery disease.  She is not running any fever, so after I evaluated I recommend to go ahead and put a double-J stent and I will see if the stone is visible on KUB, then we can make further plans to how to manage the stone.  I have several meetings with the patient's family and told them that what I am going to and we need to put the stent, I am not going after the stone yet.  On physical examination, the patient has multiple problem as mentioned including dementia, most of the history was obtained from one of her daughters daughter's and from the medical records.  She is fully conscious, alert, oriented, and only does not answer any questions.  She does not verbalize any questions.  Her temperature is 98.8, blood pressure 114/47, and O2 saturation is 98%.  Abdomen is soft and flat. Liver, spleen, kidneys not palpable.  No CVA tenderness.  She is not having any more pain since she came into the hospital.  Pelvic exam is deferred.  Extremities are normal.  IMPRESSION:  Right ureteropelvic junction stone with severe hydronephrosis.  PLAN:  Cystoscopy insertion of double-J stent.  I am afraid to do inject any dye because she might get septic, so I will just try to put the stent in and see if I can get the stent above the stone.  I have dictated and let me written down dictated note  number.     Ky Barban, M.D.     MIJ/MEDQ  D:  09/23/2011  T:  09/24/2011  Job:  [id number]

## 2011-09-24 NOTE — Anesthesia Postprocedure Evaluation (Signed)
Anesthesia Post Note  Patient: [name] [name]  Procedure(s) Performed:  CYSTOSCOPY WITH RETROGRADE PYELOGRAM/URETERAL STENT PLACEMENT  Anesthesia type: Spinal  Patient location: 303  Post pain: Pain level controlled  Post assessment: Post-op Vital signs reviewed, Patient's Cardiovascular Status Stable, Respiratory Function Stable, Patent Airway, No signs of Nausea or vomiting and Pain level controlled  Last Vitals:  Filed Vitals:   09/24/11 0545  BP: 177/72  Pulse: 72  Temp: 36.8 C  Resp: 20    Post vital signs: Reviewed and stable  Level of consciousness: awake and alert   Complications: No apparent anesthesia complications

## 2011-09-26 NOTE — Consult Note (Signed)
[name], [name] NO.:  [id number]  MEDICAL RECORD NO.:  [id number]  LOCATION:  APPO                          FACILITY:  [facility]  PHYSICIAN:  Ky Barban, M.D.DATE OF BIRTH:  January 10, 1924  DATE OF CONSULTATION:  09/22/2011 DATE OF DISCHARGE:                                CONSULTATION   This is an 77 year old female who has Alzheimer's and multiple other medical problems, is admitted primarily on the Hospitalist Service, presented in the emergency room with severe pain in her right flank with nausea, vomiting.  No fever or chills.  According to the family, she has history of having urinary tract infections, has taken antibiotic for some time, but she was not getting better.  She was not running any fever, and since she came to the hospital it does not look like she has any pain.  CT scan done in the emergency room showed that she has a 1.2 cm stone in the right ureteropelvic junction causing severe right hydronephrosis.  History is obtained from the family.  The patient is not able to give any history.  I obtained most of the history from the patient's record and from the family.  She was admitted for further management and possible insertion of double-J stent.  PAST MEDICAL HISTORY: 1. Arteriosclerotic cardiovascular disease. 2. Hypertension. 3. Hyperlipidemia. 4. Diabetes mellitus, non-insulin. 5. Leukopenia. 6. Degenerative joint disease. 7. Upper GI bleed. 8. 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 9. Sudden onset of right eye blindness, embolism suspected. 10.GE reflux with hiatal hernia. 11.Osteoporosis. 12.Lung nodule left lower lobe. 13.Parkinson disease. 14.Dementia. 15.Cervical spondylitis status code diskectomy. 16.Alzheimer disease.  PAST SURGICAL HISTORY:  Total abdominal hysterectomy, cervical diskectomy, cholecystectomy, total knee arthroplasty, colonoscopy, joint replacement, and vascular surgery.  On examination,  moderately built female not in acute distress.  Blood pressure is 177/69, temperature 98.8.  Abdomen is soft, flat.  Liver, spleen, and kidneys not palpable.  No CVA tenderness.  Pelvic exam was deferred.  LABORATORY DATA:  WBC count is 8000, hematocrit 37.4.  Sodium 139, potassium 4, chloride 105, CO2 is 24, glucose 161, BUN is 29, creatinine 1.25, calcium 9.7.  Urinalysis nitrite negative, leukocyte negative.  IMPRESSION:  Right renal calculus with severe hydronephrosis.  PLANS:  Cystoscopy, insertion of double-J stent on the right side under anesthesia.     Ky Barban, M.D.     MIJ/MEDQ  D:  09/23/2011  T:  09/23/2011  Job:  [id number]

## 2011-09-29 ENCOUNTER — Encounter (HOSPITAL_COMMUNITY): Payer: Self-pay | Admitting: Urology

## 2011-10-04 ENCOUNTER — Other Ambulatory Visit (HOSPITAL_COMMUNITY): Payer: Self-pay | Admitting: Urology

## 2011-10-04 ENCOUNTER — Ambulatory Visit (HOSPITAL_COMMUNITY)
Admission: RE | Admit: 2011-10-04 | Discharge: 2011-10-04 | Disposition: A | Discharge status: Home / Self Care | Payer: Medicare Other | Source: Ambulatory Visit | Attending: Urology | Admitting: Urology

## 2011-10-04 DIAGNOSIS — N23 Unspecified renal colic: Secondary | ICD-10-CM

## 2011-10-04 DIAGNOSIS — N200 Calculus of kidney: Secondary | ICD-10-CM | POA: Insufficient documentation

## 2011-10-04 DIAGNOSIS — Z09 Encounter for follow-up examination after completed treatment for conditions other than malignant neoplasm: Secondary | ICD-10-CM | POA: Insufficient documentation

## 2011-11-01 ENCOUNTER — Ambulatory Visit (INDEPENDENT_AMBULATORY_CARE_PROVIDER_SITE_OTHER): Payer: Medicare Other | Admitting: Internal Medicine

## 2011-11-01 ENCOUNTER — Encounter (INDEPENDENT_AMBULATORY_CARE_PROVIDER_SITE_OTHER): Payer: Self-pay | Admitting: Internal Medicine

## 2011-11-01 VITALS — BP 186/86 | HR 80 | Temp 97.8°F | Ht 61.0 in | Wt 181.0 lb

## 2011-11-01 DIAGNOSIS — K219 Gastro-esophageal reflux disease without esophagitis: Secondary | ICD-10-CM

## 2011-11-01 DIAGNOSIS — Z8379 Family history of other diseases of the digestive system: Secondary | ICD-10-CM

## 2011-11-01 DIAGNOSIS — R1310 Dysphagia, unspecified: Secondary | ICD-10-CM

## 2011-11-01 MED ORDER — OMEPRAZOLE 40 MG PO CPDR: 40.0000 mg | DELAYED_RELEASE_CAPSULE | Freq: Every day | ORAL | Status: DC

## 2011-11-01 NOTE — Patient Instructions (Signed)
Continue Carafate and Prilosec.

## 2011-11-01 NOTE — Progress Notes (Signed)
Subjective:     Patient ID: [name] [name], female   DOB: 1923/12/18, 77 y.o.   MRN: [id number] HPI  [name] [name] is a 77 yr old female here today for f/u for GERD and dysphagia.  She was last seen in October.  She c/o of her throat hurting.  She is presenting on Omeprazole 40mg  BID dosing.   08/03/2011 Swallow study normal by speech pathology.  She is taking Carafate po.  Sometimes the pill lodge.  Her appetite is good. No weight loss. Her HOB is elevated at the [facility].  Her BMs are normal. Usually has one a day. Her daughter tells me her mother had a rt kidney stone the 1st of November. She has had lithotripsy and she presently has a stent. The stent will be removed 11/07/2011.   Review of Systems see hpi     Current Outpatient Prescriptions  Medication Sig Dispense Refill  . acetaminophen (TYLENOL) 500 MG tablet Take 500 mg by mouth 2 (two) times daily. Or as needed for headache      . ALPRAZolam (XANAX) 0.25 MG tablet Take 0.25 mg by mouth daily as needed. For agitation. **Not for frequent Use**       . aspirin 81 MG tablet Take 81 mg by mouth daily.        Marland Kitchen atropine 1 % ophthalmic solution Place 1 drop into the right eye 3 (three) times daily.       . citalopram (CELEXA) 20 MG tablet Take 10 mg by mouth daily. Take 1/2 tab      . Difluprednate (DUREZOL) 0.05 % EMUL Place 1 drop into the right eye 3 (three) times daily.       Marland Kitchen docusate sodium ([employer] STOOL SOFTENER) 100 MG capsule Take 100 mg by mouth 2 (two) times daily.        . furosemide (LASIX) 20 MG tablet       . loratadine (CLARITIN) 10 MG tablet Take 10 mg by mouth daily.        Marland Kitchen losartan (COZAAR) 25 MG tablet Take 25 mg by mouth daily.        . memantine (NAMENDA) 10 MG tablet Take 10 mg by mouth 2 (two) times daily.        Marland Kitchen omeprazole (PRILOSEC) 40 MG capsule Take 1 capsule (40 mg total) by mouth daily.  30 capsule  12  . pravastatin (PRAVACHOL) 20 MG tablet Take 10 mg by mouth daily.        . sucralfate (CARAFATE) 1 G  tablet Take 1 g by mouth 4 (four) times daily.        . Tamsulosin HCl (FLOMAX) 0.4 MG CAPS Take 1 capsule (0.4 mg total) by mouth daily.  30 capsule  0  . Vitamin B Complex-C CAPS Take 1 capsule by mouth daily.       . vitamin E 400 UNIT capsule Take 400 Units by mouth daily.         Past Medical History  Diagnosis Date  . ASCVD (arteriosclerotic cardiovascular disease) 2003    Single vessel disease-60% D1  . Hypertension   . Hyperlipidemia   . Diabetes mellitus     No insulin  . Leukopenia     and anemia-hematology evaluation was nodiagnostic  . Degenerative joint disease     right TKA in 4/06  . Upper GI bleed     gastric stress ulcer in 2003  . Retinal artery branch occlusion of right eye  Sudden onset of right eye blindness-embolism suspected  . Nephrolithiasis   . Gastroesophageal reflux disease with hiatal hernia   . Osteoporosis   . Lung nodule     Left lower lobe  . Parkinson's disease   . Dementia   . Cervical spondylarthritis     S/p discectomy  . Alzheimer disease    Past Surgical History  Procedure Date  . Total abdominal hysterectomy   . Cervical discectomy     Dr. Newell Coral  . Cholecystectomy   . Total knee arthroplasty 2006    Right; Dr. Eulah Pont  . Colonoscopy 2009  . Joint replacement   . Vascular surgery   . Cystoscopy w/ ureteral stent placement 09/23/2011    Procedure: CYSTOSCOPY WITH RETROGRADE PYELOGRAM/URETERAL STENT PLACEMENT;  Surgeon: Ky Barban;  Location: [facility];  Service: Urology;  Laterality: Right;   History   Social History  . Marital Status: Widowed    Spouse Name: N/A    Number of Children: N/A  . Years of Education: N/A   Occupational History  . Not on file.   Social History Main Topics  . Smoking status: Never Smoker   . Smokeless tobacco: Never Used  . Alcohol Use: No  . Drug Use: No  . Sexually Active: Not on file   Other Topics Concern  . Not on file   Social History Narrative   Retired from [employer]    No family status information on file.   Allergies  Allergen Reactions  . Codeine   . Morphine     Objective:   Physical Exam Filed Vitals:   11/01/11 1445  Height: 5\' 1"  (1.549 m)  Weight: 181 lb (82.101 kg)    Alert. Some confusion noted. Skin warm and dry. Oral mucosa is moist. Natural teeth in good condition. Sclera anicteric, conjunctivae is pink. Thyroid not enlarged. No cervical lymphadenopathy. Lungs clear. Heart regular rate and rhythm.  Abdomen is soft. Bowel sounds are positive. No hepatomegaly. No abdominal masses felt. No tenderness.  No edema to lower extremitie      Assessment:    GERD.  which is better now.  Dysphagia is better. Presently taking po Carafate. Plan:    Continue Carafate and Prilosec.  Will cut back on the Prilosec, since her insurance will not pay.  Will have Nursing Home to crush the Carafate and mix with a small amount of water.

## 2011-11-21 ENCOUNTER — Other Ambulatory Visit (HOSPITAL_COMMUNITY): Payer: Self-pay | Admitting: Urology

## 2011-11-21 ENCOUNTER — Ambulatory Visit (HOSPITAL_COMMUNITY)
Admission: RE | Admit: 2011-11-21 | Discharge: 2011-11-21 | Disposition: A | Discharge status: Home / Self Care | Payer: Medicare Other | Source: Ambulatory Visit | Attending: Urology | Admitting: Urology

## 2011-11-21 DIAGNOSIS — Z09 Encounter for follow-up examination after completed treatment for conditions other than malignant neoplasm: Secondary | ICD-10-CM | POA: Insufficient documentation

## 2011-11-21 DIAGNOSIS — N201 Calculus of ureter: Secondary | ICD-10-CM

## 2011-11-21 DIAGNOSIS — N200 Calculus of kidney: Secondary | ICD-10-CM

## 2011-11-23 ENCOUNTER — Other Ambulatory Visit (HOSPITAL_COMMUNITY): Payer: Medicare Other

## 2011-11-23 ENCOUNTER — Ambulatory Visit (HOSPITAL_COMMUNITY)
Admission: RE | Admit: 2011-11-23 | Discharge: 2011-11-23 | Disposition: A | Discharge status: Home / Self Care | Payer: Medicare Other | Source: Ambulatory Visit | Attending: Urology | Admitting: Urology

## 2011-11-23 DIAGNOSIS — N1330 Unspecified hydronephrosis: Secondary | ICD-10-CM | POA: Diagnosis not present

## 2011-11-23 DIAGNOSIS — N200 Calculus of kidney: Secondary | ICD-10-CM | POA: Diagnosis not present

## 2011-11-23 DIAGNOSIS — K449 Diaphragmatic hernia without obstruction or gangrene: Secondary | ICD-10-CM | POA: Diagnosis not present

## 2011-11-23 DIAGNOSIS — R109 Unspecified abdominal pain: Secondary | ICD-10-CM | POA: Insufficient documentation

## 2011-11-28 DIAGNOSIS — M899 Disorder of bone, unspecified: Secondary | ICD-10-CM | POA: Diagnosis not present

## 2011-11-28 DIAGNOSIS — R262 Difficulty in walking, not elsewhere classified: Secondary | ICD-10-CM | POA: Diagnosis not present

## 2011-11-28 DIAGNOSIS — M159 Polyosteoarthritis, unspecified: Secondary | ICD-10-CM | POA: Diagnosis not present

## 2011-11-28 DIAGNOSIS — M6281 Muscle weakness (generalized): Secondary | ICD-10-CM | POA: Diagnosis not present

## 2011-11-28 DIAGNOSIS — N200 Calculus of kidney: Secondary | ICD-10-CM | POA: Diagnosis not present

## 2011-12-02 DIAGNOSIS — N23 Unspecified renal colic: Secondary | ICD-10-CM | POA: Diagnosis not present

## 2011-12-16 ENCOUNTER — Ambulatory Visit: Payer: Medicare Other | Admitting: Urology

## 2011-12-21 ENCOUNTER — Other Ambulatory Visit: Payer: Self-pay | Admitting: Family Medicine

## 2011-12-21 DIAGNOSIS — Z139 Encounter for screening, unspecified: Secondary | ICD-10-CM

## 2011-12-26 DIAGNOSIS — M949 Disorder of cartilage, unspecified: Secondary | ICD-10-CM | POA: Diagnosis not present

## 2011-12-26 DIAGNOSIS — R262 Difficulty in walking, not elsewhere classified: Secondary | ICD-10-CM | POA: Diagnosis not present

## 2011-12-26 DIAGNOSIS — N200 Calculus of kidney: Secondary | ICD-10-CM | POA: Diagnosis not present

## 2011-12-26 DIAGNOSIS — M159 Polyosteoarthritis, unspecified: Secondary | ICD-10-CM | POA: Diagnosis not present

## 2011-12-26 DIAGNOSIS — M6281 Muscle weakness (generalized): Secondary | ICD-10-CM | POA: Diagnosis not present

## 2011-12-26 DIAGNOSIS — M899 Disorder of bone, unspecified: Secondary | ICD-10-CM | POA: Diagnosis not present

## 2011-12-29 ENCOUNTER — Ambulatory Visit (HOSPITAL_COMMUNITY)
Admission: RE | Admit: 2011-12-29 | Discharge: 2011-12-29 | Disposition: A | Discharge status: Home / Self Care | Payer: Medicare Other | Source: Ambulatory Visit | Attending: Family Medicine | Admitting: Family Medicine

## 2011-12-29 DIAGNOSIS — Z1231 Encounter for screening mammogram for malignant neoplasm of breast: Secondary | ICD-10-CM | POA: Diagnosis not present

## 2011-12-29 DIAGNOSIS — Z139 Encounter for screening, unspecified: Secondary | ICD-10-CM

## 2012-01-04 ENCOUNTER — Other Ambulatory Visit: Payer: Self-pay | Admitting: Family Medicine

## 2012-01-04 DIAGNOSIS — R928 Other abnormal and inconclusive findings on diagnostic imaging of breast: Secondary | ICD-10-CM

## 2012-01-18 ENCOUNTER — Ambulatory Visit (HOSPITAL_COMMUNITY)
Admission: RE | Admit: 2012-01-18 | Discharge: 2012-01-18 | Disposition: A | Discharge status: Home / Self Care | Payer: Medicare Other | Source: Ambulatory Visit | Attending: Family Medicine | Admitting: Family Medicine

## 2012-01-18 DIAGNOSIS — R928 Other abnormal and inconclusive findings on diagnostic imaging of breast: Secondary | ICD-10-CM | POA: Diagnosis not present

## 2012-01-19 DIAGNOSIS — I739 Peripheral vascular disease, unspecified: Secondary | ICD-10-CM | POA: Diagnosis not present

## 2012-01-24 ENCOUNTER — Other Ambulatory Visit: Payer: Self-pay

## 2012-01-24 ENCOUNTER — Emergency Department (HOSPITAL_COMMUNITY): Payer: Medicare Other

## 2012-01-24 ENCOUNTER — Encounter (HOSPITAL_COMMUNITY): Payer: Self-pay | Admitting: Oncology

## 2012-01-24 ENCOUNTER — Emergency Department (HOSPITAL_COMMUNITY)
Admission: EM | Admit: 2012-01-24 | Discharge: 2012-01-24 | Disposition: A | Discharge status: Home / Self Care | Payer: Medicare Other | Attending: Emergency Medicine | Admitting: Emergency Medicine

## 2012-01-24 DIAGNOSIS — F0390 Unspecified dementia without behavioral disturbance: Secondary | ICD-10-CM | POA: Insufficient documentation

## 2012-01-24 DIAGNOSIS — I447 Left bundle-branch block, unspecified: Secondary | ICD-10-CM | POA: Insufficient documentation

## 2012-01-24 DIAGNOSIS — Z7982 Long term (current) use of aspirin: Secondary | ICD-10-CM | POA: Diagnosis not present

## 2012-01-24 DIAGNOSIS — Z9079 Acquired absence of other genital organ(s): Secondary | ICD-10-CM | POA: Diagnosis not present

## 2012-01-24 DIAGNOSIS — I2510 Atherosclerotic heart disease of native coronary artery without angina pectoris: Secondary | ICD-10-CM | POA: Insufficient documentation

## 2012-01-24 DIAGNOSIS — R55 Syncope and collapse: Secondary | ICD-10-CM | POA: Insufficient documentation

## 2012-01-24 DIAGNOSIS — H34239 Retinal artery branch occlusion, unspecified eye: Secondary | ICD-10-CM | POA: Diagnosis not present

## 2012-01-24 DIAGNOSIS — M1990 Unspecified osteoarthritis, unspecified site: Secondary | ICD-10-CM | POA: Diagnosis not present

## 2012-01-24 DIAGNOSIS — R5381 Other malaise: Secondary | ICD-10-CM | POA: Diagnosis not present

## 2012-01-24 DIAGNOSIS — R404 Transient alteration of awareness: Secondary | ICD-10-CM | POA: Diagnosis not present

## 2012-01-24 DIAGNOSIS — N390 Urinary tract infection, site not specified: Secondary | ICD-10-CM | POA: Diagnosis not present

## 2012-01-24 DIAGNOSIS — E119 Type 2 diabetes mellitus without complications: Secondary | ICD-10-CM | POA: Diagnosis not present

## 2012-01-24 DIAGNOSIS — Z96659 Presence of unspecified artificial knee joint: Secondary | ICD-10-CM | POA: Insufficient documentation

## 2012-01-24 DIAGNOSIS — J984 Other disorders of lung: Secondary | ICD-10-CM | POA: Insufficient documentation

## 2012-01-24 DIAGNOSIS — G309 Alzheimer's disease, unspecified: Secondary | ICD-10-CM | POA: Insufficient documentation

## 2012-01-24 DIAGNOSIS — I440 Atrioventricular block, first degree: Secondary | ICD-10-CM | POA: Diagnosis not present

## 2012-01-24 DIAGNOSIS — Z87442 Personal history of urinary calculi: Secondary | ICD-10-CM | POA: Diagnosis not present

## 2012-01-24 DIAGNOSIS — F0280 Dementia in other diseases classified elsewhere without behavioral disturbance: Secondary | ICD-10-CM | POA: Diagnosis not present

## 2012-01-24 DIAGNOSIS — G20 Parkinson's disease: Secondary | ICD-10-CM | POA: Insufficient documentation

## 2012-01-24 DIAGNOSIS — R079 Chest pain, unspecified: Secondary | ICD-10-CM | POA: Diagnosis not present

## 2012-01-24 DIAGNOSIS — R110 Nausea: Secondary | ICD-10-CM | POA: Diagnosis not present

## 2012-01-24 DIAGNOSIS — M810 Age-related osteoporosis without current pathological fracture: Secondary | ICD-10-CM | POA: Insufficient documentation

## 2012-01-24 DIAGNOSIS — I10 Essential (primary) hypertension: Secondary | ICD-10-CM | POA: Diagnosis not present

## 2012-01-24 DIAGNOSIS — R5383 Other fatigue: Secondary | ICD-10-CM | POA: Diagnosis not present

## 2012-01-24 DIAGNOSIS — G20A1 Parkinson's disease without dyskinesia, without mention of fluctuations: Secondary | ICD-10-CM | POA: Insufficient documentation

## 2012-01-24 DIAGNOSIS — E785 Hyperlipidemia, unspecified: Secondary | ICD-10-CM | POA: Diagnosis not present

## 2012-01-24 DIAGNOSIS — S2239XA Fracture of one rib, unspecified side, initial encounter for closed fracture: Secondary | ICD-10-CM | POA: Diagnosis not present

## 2012-01-24 LAB — BASIC METABOLIC PANEL
CO2: 26 mEq/L (ref 19–32)
Calcium: 9.6 mg/dL (ref 8.4–10.5)
Creatinine, Ser: 1.21 mg/dL — ABNORMAL HIGH (ref 0.50–1.10)
GFR calc non Af Amer: 40 mL/min — ABNORMAL LOW (ref >90)
Glucose, Bld: 143 mg/dL — ABNORMAL HIGH (ref 70–99)
Sodium: 139 mEq/L (ref 135–145)

## 2012-01-24 LAB — URINALYSIS, ROUTINE W REFLEX MICROSCOPIC
Bilirubin Urine: NEGATIVE
Glucose, UA: NEGATIVE mg/dL
Ketones, ur: NEGATIVE mg/dL
Protein, ur: NEGATIVE mg/dL
Urobilinogen, UA: 0.2 mg/dL (ref 0.0–1.0)

## 2012-01-24 LAB — URINE MICROSCOPIC-ADD ON

## 2012-01-24 LAB — HEPATIC FUNCTION PANEL
Albumin: 3.7 g/dL (ref 3.5–5.2)
Alkaline Phosphatase: 43 U/L (ref 39–117)
Indirect Bilirubin: 0.1 mg/dL — ABNORMAL LOW (ref 0.3–0.9)
Total Protein: 6.9 g/dL (ref 6.0–8.3)

## 2012-01-24 LAB — DIFFERENTIAL
Basophils Absolute: 0 10*3/uL (ref 0.0–0.1)
Eosinophils Absolute: 0.1 10*3/uL (ref 0.0–0.7)
Eosinophils Relative: 1 % (ref 0–5)
Lymphocytes Relative: 22 % (ref 12–46)
Lymphs Abs: 1.5 10*3/uL (ref 0.7–4.0)
Monocytes Absolute: 0.6 10*3/uL (ref 0.1–1.0)

## 2012-01-24 LAB — LIPASE, BLOOD: Lipase: 33 U/L (ref 11–59)

## 2012-01-24 LAB — CBC
HCT: 39.3 % (ref 36.0–46.0)
MCH: 32.2 pg (ref 26.0–34.0)
MCV: 102.1 fL — ABNORMAL HIGH (ref 78.0–100.0)
Platelets: 215 10*3/uL (ref 150–400)
RDW: 12.6 % (ref 11.5–15.5)

## 2012-01-24 LAB — TROPONIN I: Troponin I: <0.3 ng/mL (ref <0.30)

## 2012-01-24 MED ORDER — ONDANSETRON HCL 4 MG/2ML IJ SOLN: 4.0000 mg | INTRAMUSCULAR | Status: DC | PRN

## 2012-01-24 MED ORDER — ONDANSETRON 4 MG PO TBDP: 4.0000 mg | ORAL_TABLET | Freq: Four times a day (QID) | ORAL | Status: AC | PRN

## 2012-01-24 MED ORDER — CEPHALEXIN 500 MG PO CAPS: 500.0000 mg | ORAL_CAPSULE | Freq: Four times a day (QID) | ORAL | Status: DC

## 2012-01-24 MED ORDER — DEXTROSE 5 % IV SOLN
1.0000 g | Freq: Once | INTRAVENOUS | Status: AC
  Administered 2012-01-24: 1 g via INTRAVENOUS
  Filled 2012-01-24: qty 10

## 2012-01-24 MED ORDER — ONDANSETRON 4 MG PO TBDP: 4.0000 mg | ORAL_TABLET | Freq: Four times a day (QID) | ORAL | Status: DC | PRN

## 2012-01-24 MED ORDER — SODIUM CHLORIDE 0.9 % IV SOLN
INTRAVENOUS | Status: DC
  Administered 2012-01-24: 16:00:00 via INTRAVENOUS

## 2012-01-24 MED ORDER — CEPHALEXIN 500 MG PO CAPS: 500.0000 mg | ORAL_CAPSULE | Freq: Four times a day (QID) | ORAL | Status: AC

## 2012-01-24 MED ORDER — ACETAMINOPHEN 500 MG PO TABS
1000.0000 mg | ORAL_TABLET | Freq: Once | ORAL | Status: AC
  Administered 2012-01-24: 1000 mg via ORAL
  Filled 2012-01-24 (×2): qty 2

## 2012-01-24 NOTE — Discharge Instructions (Signed)
RESOURCE GUIDE  Dental Problems  Patients with Medicaid: [facility]                     [facility] [address]                                           [address] Phone:  [phone]                                                  Phone:  [phone]  If unable to pay or uninsured, contact:  [facility] or [facility] to become qualified for the adult dental clinic.  Chronic Pain Problems Contact [facility]  [phone] Patients need to be referred by their primary care doctor.  Insufficient Money for Medicine Contact [employer]:  call "211" or [facility] [phone].  No Primary Care Doctor Call [facility]  [phone] Other agencies that provide inexpensive medical care    [facility]  [phone]    [facility]  [phone]    [facility]  [phone]    Women[facility] Clinic  [phone]    [facility]  [phone]    [facility]  [phone]  Psychological Services [facility]  [phone] [facility]  [phone] [facility]   [phone] (emergency services [phone])  Substance Abuse Resources [facility]  336-882-2125 [facility] 336-784-9470 [facility] 336-285-9073 [facility] 336-845-3988 Residential & Outpatient Substance Abuse Program  800-659-3381  Abuse/Neglect [place] Child Abuse Hotline (336) 641-3795 [place] Child Abuse Hotline 800-378-5315 (After Hours)  Emergency Shelter [facility] (336) 271-5985  Maternity Homes [facility] (336) 275-9566 [facility] (704) 372-4663  MRSA Hotline #:   832-7006    [place] [facility]  [facility] [place]     [employer]                          [facility] [address] [place]                       [address]      [address]  [place]                                                [place]                            [place] Phone:  [phone]                                   Phone:  [phone]                 Phone:  [phone]  [facility] Phone:  [phone]    [facility] Child Abuse Hotline 802-685-8875 806-200-0108 (After Hours)    Take the prescription as directed.  Call your regular medical doctor tomorrow morning to schedule a follow up appointment within the next 24 to 48 hours.  Return to the Emergency Department immediately sooner if worsening.

## 2012-01-24 NOTE — ED Provider Notes (Signed)
History     CSN: [id number]  Arrival date & time 01/24/12  1150   First MD Initiated Contact with Patient 01/24/12 1301      Chief Complaint  Patient presents with  . Near Syncope  . Chest Pain  . Nausea  . Emesis  . Diarrhea    The history is provided by the nursing home, the EMS personnel and a relative. History Limited By: hx dementia   Pt was seen at 1310.  Per EMS, NH report and family at bedside, pt sent to the ED for near syncopal episode PTA.  Near syncope occurred after pt experienced an episode of N/V/D this morning.  Pt denies any complaints currently, though NH reports pt c/o vague "chest pain" that began after pt was vomiting.  Denies CP/SOB, no cough, no fevers, no abd pain, no back pain.    Past Medical History  Diagnosis Date  . ASCVD (arteriosclerotic cardiovascular disease) 2003    Single vessel disease-60% D1  . Hypertension   . Hyperlipidemia   . Diabetes mellitus     No insulin  . Leukopenia     and anemia-hematology evaluation was nodiagnostic  . Degenerative joint disease     right TKA in 4/06  . Upper GI bleed     gastric stress ulcer in 2003  . Retinal artery branch occlusion of right eye     Sudden onset of right eye blindness-embolism suspected  . Nephrolithiasis   . Gastroesophageal reflux disease with hiatal hernia   . Osteoporosis   . Lung nodule     Left lower lobe  . Parkinson's disease   . Dementia   . Cervical spondylarthritis     S/p discectomy  . Alzheimer disease     Past Surgical History  Procedure Date  . Total abdominal hysterectomy   . Cervical discectomy     Dr. Newell Coral  . Cholecystectomy   . Total knee arthroplasty 2006    Right; Dr. Eulah Pont  . Colonoscopy 2009  . Joint replacement   . Vascular surgery   . Cystoscopy w/ ureteral stent placement 09/23/2011    Procedure: CYSTOSCOPY WITH RETROGRADE PYELOGRAM/URETERAL STENT PLACEMENT;  Surgeon: Ky Barban;  Location: [facility];  Service: Urology;  Laterality: Right;     Family History  Problem Relation Age of Onset  . Diabetes Sister   . Heart disease Sister   . Diabetes Brother     History  Substance Use Topics  . Smoking status: Never Smoker   . Smokeless tobacco: Never Used  . Alcohol Use: No    Review of Systems  Unable to perform ROS: Dementia    Allergies  Codeine and Morphine  Home Medications   Current Outpatient Rx  Name Route Sig Dispense Refill  . ACETAMINOPHEN 500 MG PO TABS Oral Take 500 mg by mouth 2 (two) times daily. Or as needed for headache    . ACETAMINOPHEN 500 MG PO TABS Oral Take 500 mg by mouth every 6 (six) hours as needed. Headache    . ASPIRIN 81 MG PO TABS Oral Take 81 mg by mouth daily.      . ATROPINE SULFATE 1 % OP SOLN Right Eye Place 1 drop into the right eye 3 (three) times daily.     Marland Kitchen CITALOPRAM HYDROBROMIDE 20 MG PO TABS Oral Take 10 mg by mouth daily.     Marland Kitchen DIFLUPREDNATE 0.05 % OP EMUL Right Eye Place 1 drop into the right eye 3 (three) times  daily.     Marland Kitchen DOCUSATE SODIUM 100 MG PO CAPS Oral Take 100 mg by mouth 2 (two) times daily.      . FUROSEMIDE 20 MG PO TABS Oral Take 20 mg by mouth daily.     Marland Kitchen LORATADINE 10 MG PO TABS Oral Take 10 mg by mouth daily.      Marland Kitchen LOSARTAN POTASSIUM 25 MG PO TABS Oral Take 25 mg by mouth daily.      Marland Kitchen MEMANTINE HCL 10 MG PO TABS Oral Take 10 mg by mouth 2 (two) times daily.      Marland Kitchen OMEPRAZOLE 40 MG PO CPDR Oral Take 1 capsule (40 mg total) by mouth daily. 30 capsule 12  . POTASSIUM CHLORIDE CRYS ER 10 MEQ PO TBCR Oral Take 10 mEq by mouth daily.    Marland Kitchen PRAVASTATIN SODIUM 20 MG PO TABS Oral Take 10 mg by mouth daily.      . SUCRALFATE 1 G PO TABS Oral Take 1 g by mouth 4 (four) times daily.      Marland Kitchen VITAMIN B COMPLEX-C PO CAPS Oral Take 1 capsule by mouth daily.     Marland Kitchen VITAMIN E 400 UNITS PO CAPS Oral Take 400 Units by mouth daily.      Marland Kitchen ALPRAZOLAM 0.25 MG PO TABS Oral Take 0.25 mg by mouth daily as needed. For agitation. **Not for frequent Use**       BP 133/60   Pulse 76  Temp(Src) 98.3 F (36.8 C) (Oral)  Resp 18  SpO2 95%  Physical Exam 1315: Physical examination:  Nursing notes reviewed; Vital signs and O2 SAT reviewed;  Constitutional: Well developed, Well nourished, Well hydrated, In no acute distress; Head:  Normocephalic, atraumatic; Eyes: EOMI, PERRL, No scleral icterus; ENMT: Mouth and pharynx normal, Mucous membranes moist; Neck: Supple, Full range of motion, No lymphadenopathy; Cardiovascular: Regular rate and rhythm, No murmur, rub, or gallop; Respiratory: Breath sounds clear & equal bilaterally, No rales, rhonchi, wheezes, or rub, Normal respiratory effort/excursion; Chest: Nontender, Movement normal; Abdomen: Soft, Nontender, Nondistended, Normal bowel sounds; Extremities: Pulses normal, No tenderness, No edema, No calf edema or asymmetry.; Neuro: Awake, alert, confused re: time, place, events, no facial droop, speech clear, moves all ext well on stretcher spontaneously and to command; Skin: Color normal, Warm, Dry, no rash.    ED Course  Procedures   MDM  MDM Reviewed: previous chart, nursing note and vitals Reviewed previous: ECG Interpretation: ECG, labs, x-ray and CT scan    Date: 01/24/2012  Rate: 77  Rhythm: normal sinus rhythm  QRS Axis: left  Intervals: PR prolonged  ST/T Wave abnormalities: normal  Conduction Disutrbances:left bundle branch block  Narrative Interpretation:   Old EKG Reviewed: unchanged; no significant changes from previous EKG dated 09/21/2011.  Results for orders placed during the hospital encounter of 01/24/12  CBC      Component Value Range   WBC 6.8  4.0 - 10.5 (K/uL)   RBC 3.85 (*) 3.87 - 5.11 (MIL/uL)   Hemoglobin 12.4  12.0 - 15.0 (g/dL)   HCT 40.9  81.1 - 91.4 (%)   MCV 102.1 (*) 78.0 - 100.0 (fL)   MCH 32.2  26.0 - 34.0 (pg)   MCHC 31.6  30.0 - 36.0 (g/dL)   RDW 78.2  95.6 - 21.3 (%)   Platelets 215  150 - 400 (K/uL)  DIFFERENTIAL      Component Value Range   Neutrophils Relative  68  43 - 77 (%)  Neutro Abs 4.7  1.7 - 7.7 (K/uL)   Lymphocytes Relative 22  12 - 46 (%)   Lymphs Abs 1.5  0.7 - 4.0 (K/uL)   Monocytes Relative 9  3 - 12 (%)   Monocytes Absolute 0.6  0.1 - 1.0 (K/uL)   Eosinophils Relative 1  0 - 5 (%)   Eosinophils Absolute 0.1  0.0 - 0.7 (K/uL)   Basophils Relative 0  0 - 1 (%)   Basophils Absolute 0.0  0.0 - 0.1 (K/uL)  BASIC METABOLIC PANEL      Component Value Range   Sodium 139  135 - 145 (mEq/L)   Potassium 4.4  3.5 - 5.1 (mEq/L)   Chloride 104  96 - 112 (mEq/L)   CO2 26  19 - 32 (mEq/L)   Glucose, Bld 143 (*) 70 - 99 (mg/dL)   BUN 25 (*) 6 - 23 (mg/dL)   Creatinine, Ser 1.61 (*) 0.50 - 1.10 (mg/dL)   Calcium 9.6  8.4 - 09.6 (mg/dL)   GFR calc non Af Amer 40 (*) >90 (mL/min)   GFR calc Af Amer 46 (*) >90 (mL/min)  URINALYSIS, ROUTINE W REFLEX MICROSCOPIC      Component Value Range   Color, Urine YELLOW  YELLOW    APPearance CLEAR  CLEAR    Specific Gravity, Urine 1.020  1.005 - 1.030    pH 5.5  5.0 - 8.0    Glucose, UA NEGATIVE  NEGATIVE (mg/dL)   Hgb urine dipstick SMALL (*) NEGATIVE    Bilirubin Urine NEGATIVE  NEGATIVE    Ketones, ur NEGATIVE  NEGATIVE (mg/dL)   Protein, ur NEGATIVE  NEGATIVE (mg/dL)   Urobilinogen, UA 0.2  0.0 - 1.0 (mg/dL)   Nitrite NEGATIVE  NEGATIVE    Leukocytes, UA SMALL (*) NEGATIVE   HEPATIC FUNCTION PANEL      Component Value Range   Total Protein 6.9  6.0 - 8.3 (g/dL)   Albumin 3.7  3.5 - 5.2 (g/dL)   AST 16  0 - 37 (U/L)   ALT 16  0 - 35 (U/L)   Alkaline Phosphatase 43  39 - 117 (U/L)   Total Bilirubin 0.2 (*) 0.3 - 1.2 (mg/dL)   Bilirubin, Direct 0.1  0.0 - 0.3 (mg/dL)   Indirect Bilirubin 0.1 (*) 0.3 - 0.9 (mg/dL)  LIPASE, BLOOD      Component Value Range   Lipase 33  11 - 59 (U/L)  TROPONIN I      Component Value Range   Troponin I <0.30  <0.30 (ng/mL)  URINE MICROSCOPIC-ADD ON      Component Value Range   Squamous Epithelial / LPF RARE  RARE    WBC, UA 11-20  <3 (WBC/hpf)   RBC / HPF  7-10  <3 (RBC/hpf)   Bacteria, UA FEW (*) RARE     Dg Chest 2 View 01/24/2012  *RADIOLOGY REPORT*  Clinical Data: 78 year old female with chest pain, nausea, weakness.  CHEST - 2 VIEW  Comparison: 06/23/2011 and earlier.  Findings: Lung volumes are stable and within normal limits.  Stable mild cardiomegaly. Other mediastinal contours are within normal limits.  Cervical ACDF hardware again noted.  No pneumothorax, pulmonary edema, pleural effusion or acute pulmonary opacity. Chronic lower thoracic compression fracture and left rib fractures. Right upper quadrant surgical clips.  IMPRESSION: No acute cardiopulmonary abnormality.  Original Report Authenticated By: Harley Hallmark, M.D.   Ct Head Wo Contrast 01/24/2012  *RADIOLOGY REPORT*  Clinical Data: 78 year old female with  near-syncope.  CT HEAD WITHOUT CONTRAST  Technique:  Contiguous axial images were obtained from the base of the skull through the vertex without contrast.  Comparison: 06/25/2011 and earlier.  Findings: Visualized paranasal sinuses and mastoids are clear.  No acute osseous abnormality identified.  Chronic changes to the right globe.  Stable left orbit and scalp soft tissues.  Calcified atherosclerosis at the skull base.  Stable ventricle size and configuration.  There may still be a small volume of chronic blood products in the occipital horns. No midline shift, mass effect, or evidence of mass lesion.  No acute intracranial hemorrhage identified.  Extensive cerebral white matter disease has not significantly changed.  Focal hypodensity in the medial left thalamus is more conspicuous but I favor this also is chronic. No evidence of cortically based acute infarction identified.  No suspicious intracranial vascular hyperdensity.  IMPRESSION: 1.  Increased conspicuity of hypodensity in the medial left thalamus indicating a lacunar infarct, but I favor this is chronic in light of the 06/23/2011 MRI appearance of the thalami. 2.  No definite acute  intracranial abnormality. 3.  There may still be a residual layering chronic blood products in the occipital horns.  Original Report Authenticated By: Harley Hallmark, M.D.     7:01 PM:  Pt has ambulated multiple times in the ED with steady gait, easy resps.  Has tol PO well without N/V.  No diarrhea while in ED.  VS remain stable, not orthostatic.  +UTI, UC pending.  1st dose abx given in ED.  Pt and family wants her to go back to NH now.  Dx testing d/w pt and family.  Questions answered.  Verb understanding, agreeable to d/c home with outpt f/u.             Laray Anger, DO 01/26/12 [phone]

## 2012-01-24 NOTE — ED Notes (Signed)
Per ems report, pt experienced a syncopal episode w/ CP just pta.  Pt is alert, appropriate now.  Pt is a resident of [address] and is in their Alzheimer's unit.  Valid DNR accompanied the patient.

## 2012-01-26 LAB — URINE CULTURE: Colony Count: 40000

## 2012-01-27 NOTE — ED Notes (Signed)
+   Urine Patient treated with Keflex-sensitive to same-chart appended per protocol MD. 

## 2012-02-09 DIAGNOSIS — N390 Urinary tract infection, site not specified: Secondary | ICD-10-CM | POA: Diagnosis not present

## 2012-02-09 DIAGNOSIS — N200 Calculus of kidney: Secondary | ICD-10-CM | POA: Diagnosis not present

## 2012-03-16 DIAGNOSIS — M545 Low back pain: Secondary | ICD-10-CM | POA: Diagnosis not present

## 2012-03-16 DIAGNOSIS — R358 Other polyuria: Secondary | ICD-10-CM | POA: Diagnosis not present

## 2012-03-16 DIAGNOSIS — E119 Type 2 diabetes mellitus without complications: Secondary | ICD-10-CM | POA: Diagnosis not present

## 2012-03-19 DIAGNOSIS — Z79899 Other long term (current) drug therapy: Secondary | ICD-10-CM | POA: Diagnosis not present

## 2012-03-19 DIAGNOSIS — I10 Essential (primary) hypertension: Secondary | ICD-10-CM | POA: Diagnosis not present

## 2012-03-19 DIAGNOSIS — R5381 Other malaise: Secondary | ICD-10-CM | POA: Diagnosis not present

## 2012-03-19 DIAGNOSIS — E119 Type 2 diabetes mellitus without complications: Secondary | ICD-10-CM | POA: Diagnosis not present

## 2012-03-20 ENCOUNTER — Encounter (INDEPENDENT_AMBULATORY_CARE_PROVIDER_SITE_OTHER): Payer: Self-pay

## 2012-03-21 DIAGNOSIS — E109 Type 1 diabetes mellitus without complications: Secondary | ICD-10-CM | POA: Diagnosis not present

## 2012-03-21 DIAGNOSIS — H348192 Central retinal vein occlusion, unspecified eye, stable: Secondary | ICD-10-CM | POA: Diagnosis not present

## 2012-03-21 DIAGNOSIS — H44529 Atrophy of globe, unspecified eye: Secondary | ICD-10-CM | POA: Diagnosis not present

## 2012-03-26 DIAGNOSIS — R300 Dysuria: Secondary | ICD-10-CM | POA: Diagnosis not present

## 2012-04-12 DIAGNOSIS — I739 Peripheral vascular disease, unspecified: Secondary | ICD-10-CM | POA: Diagnosis not present

## 2012-05-22 DIAGNOSIS — E119 Type 2 diabetes mellitus without complications: Secondary | ICD-10-CM | POA: Diagnosis not present

## 2012-05-22 DIAGNOSIS — F411 Generalized anxiety disorder: Secondary | ICD-10-CM | POA: Diagnosis not present

## 2012-06-02 DIAGNOSIS — R4182 Altered mental status, unspecified: Secondary | ICD-10-CM | POA: Diagnosis not present

## 2012-06-06 ENCOUNTER — Emergency Department (HOSPITAL_COMMUNITY)
Admission: EM | Admit: 2012-06-06 | Discharge: 2012-06-06 | Disposition: A | Discharge status: Home / Self Care | Payer: Medicare Other | Attending: Emergency Medicine | Admitting: Emergency Medicine

## 2012-06-06 ENCOUNTER — Encounter (HOSPITAL_COMMUNITY): Payer: Self-pay | Admitting: Emergency Medicine

## 2012-06-06 ENCOUNTER — Emergency Department (HOSPITAL_COMMUNITY): Payer: Medicare Other

## 2012-06-06 DIAGNOSIS — Y9210 Unspecified residential institution as the place of occurrence of the external cause: Secondary | ICD-10-CM | POA: Insufficient documentation

## 2012-06-06 DIAGNOSIS — I2510 Atherosclerotic heart disease of native coronary artery without angina pectoris: Secondary | ICD-10-CM | POA: Diagnosis not present

## 2012-06-06 DIAGNOSIS — M810 Age-related osteoporosis without current pathological fracture: Secondary | ICD-10-CM | POA: Diagnosis not present

## 2012-06-06 DIAGNOSIS — I10 Essential (primary) hypertension: Secondary | ICD-10-CM | POA: Diagnosis not present

## 2012-06-06 DIAGNOSIS — W19XXXA Unspecified fall, initial encounter: Secondary | ICD-10-CM | POA: Insufficient documentation

## 2012-06-06 DIAGNOSIS — E119 Type 2 diabetes mellitus without complications: Secondary | ICD-10-CM | POA: Diagnosis not present

## 2012-06-06 DIAGNOSIS — G20 Parkinson's disease: Secondary | ICD-10-CM | POA: Insufficient documentation

## 2012-06-06 DIAGNOSIS — Z23 Encounter for immunization: Secondary | ICD-10-CM | POA: Insufficient documentation

## 2012-06-06 DIAGNOSIS — M79609 Pain in unspecified limb: Secondary | ICD-10-CM | POA: Diagnosis not present

## 2012-06-06 DIAGNOSIS — IMO0002 Reserved for concepts with insufficient information to code with codable children: Secondary | ICD-10-CM

## 2012-06-06 DIAGNOSIS — G20A1 Parkinson's disease without dyskinesia, without mention of fluctuations: Secondary | ICD-10-CM | POA: Insufficient documentation

## 2012-06-06 DIAGNOSIS — Z79899 Other long term (current) drug therapy: Secondary | ICD-10-CM | POA: Insufficient documentation

## 2012-06-06 DIAGNOSIS — G309 Alzheimer's disease, unspecified: Secondary | ICD-10-CM | POA: Insufficient documentation

## 2012-06-06 DIAGNOSIS — E785 Hyperlipidemia, unspecified: Secondary | ICD-10-CM | POA: Diagnosis not present

## 2012-06-06 DIAGNOSIS — Z7982 Long term (current) use of aspirin: Secondary | ICD-10-CM | POA: Diagnosis not present

## 2012-06-06 DIAGNOSIS — Z9089 Acquired absence of other organs: Secondary | ICD-10-CM | POA: Diagnosis not present

## 2012-06-06 DIAGNOSIS — R58 Hemorrhage, not elsewhere classified: Secondary | ICD-10-CM | POA: Diagnosis not present

## 2012-06-06 DIAGNOSIS — S61209A Unspecified open wound of unspecified finger without damage to nail, initial encounter: Secondary | ICD-10-CM | POA: Diagnosis not present

## 2012-06-06 DIAGNOSIS — S6990XA Unspecified injury of unspecified wrist, hand and finger(s), initial encounter: Secondary | ICD-10-CM | POA: Diagnosis not present

## 2012-06-06 DIAGNOSIS — Z043 Encounter for examination and observation following other accident: Secondary | ICD-10-CM | POA: Diagnosis not present

## 2012-06-06 DIAGNOSIS — F0280 Dementia in other diseases classified elsewhere without behavioral disturbance: Secondary | ICD-10-CM | POA: Insufficient documentation

## 2012-06-06 LAB — BASIC METABOLIC PANEL
BUN: 24 mg/dL — ABNORMAL HIGH (ref 6–23)
GFR calc Af Amer: 43 mL/min — ABNORMAL LOW (ref >90)
GFR calc non Af Amer: 37 mL/min — ABNORMAL LOW (ref >90)
Potassium: 4.2 mEq/L (ref 3.5–5.1)

## 2012-06-06 LAB — CBC WITH DIFFERENTIAL/PLATELET
Basophils Relative: 1 % (ref 0–1)
Eosinophils Absolute: 0.1 10*3/uL (ref 0.0–0.7)
Hemoglobin: 11.8 g/dL — ABNORMAL LOW (ref 12.0–15.0)
MCH: 32.5 pg (ref 26.0–34.0)
MCHC: 32.4 g/dL (ref 30.0–36.0)
Monocytes Relative: 11 % (ref 3–12)
Neutrophils Relative %: 45 % (ref 43–77)
Platelets: 198 10*3/uL (ref 150–400)
RDW: 13.2 % (ref 11.5–15.5)

## 2012-06-06 MED ORDER — TETANUS-DIPHTH-ACELL PERTUSSIS 5-2.5-18.5 LF-MCG/0.5 IM SUSP
0.5000 mL | Freq: Once | INTRAMUSCULAR | Status: AC
  Administered 2012-06-06: 0.5 mL via INTRAMUSCULAR
  Filled 2012-06-06: qty 0.5

## 2012-06-06 MED ORDER — LIDOCAINE HCL (PF) 1 % IJ SOLN
INTRAMUSCULAR | Status: AC
  Administered 2012-06-06: 20:00:00
  Filled 2012-06-06: qty 5

## 2012-06-06 MED ORDER — LIDOCAINE HCL (PF) 1 % IJ SOLN
30.0000 mL | Freq: Once | INTRAMUSCULAR | Status: DC
  Filled 2012-06-06: qty 30

## 2012-06-06 MED ORDER — LIDOCAINE HCL (PF) 1 % IJ SOLN
INTRAMUSCULAR | Status: AC
  Administered 2012-06-06: 20:00:00
  Filled 2012-06-06: qty 10

## 2012-06-06 NOTE — ED Notes (Signed)
Report given to med tech at [facility], Aletheia

## 2012-06-06 NOTE — ED Provider Notes (Signed)
History     CSN: [id number]  Arrival date & time 06/06/12  1803   First MD Initiated Contact with Patient 06/06/12 1807      Chief Complaint  Patient presents with  . Fall    (Consider location/radiation/quality/duration/timing/severity/associated sxs/prior treatment) HPI Comments: Patient presents from [facility] after mechanical fall. She was walking with her walker and appeared to have slid down the wall. Did not hit her head or lose consciousness. She is demented at baseline and unable to provide a history. She is oriented to herself only. She is moving all extremities. She has lacerations to her right hand. He denies any chest pain, back pain, neck pain or abdominal pain.  The history is provided by the patient and the EMS personnel. The history is limited by the condition of the patient.    Past Medical History  Diagnosis Date  . ASCVD (arteriosclerotic cardiovascular disease) 2003    Single vessel disease-60% D1  . Hypertension   . Hyperlipidemia   . Diabetes mellitus     No insulin  . Leukopenia     and anemia-hematology evaluation was nodiagnostic  . Degenerative joint disease     right TKA in 4/06  . Upper GI bleed     gastric stress ulcer in 2003  . Retinal artery branch occlusion of right eye     Sudden onset of right eye blindness-embolism suspected  . Nephrolithiasis   . Gastroesophageal reflux disease with hiatal hernia   . Osteoporosis   . Lung nodule     Left lower lobe  . Parkinson's disease   . Dementia   . Cervical spondylarthritis     S/p discectomy  . Alzheimer disease     Past Surgical History  Procedure Date  . Total abdominal hysterectomy   . Cervical discectomy     Dr. Newell Coral  . Cholecystectomy   . Total knee arthroplasty 2006    Right; Dr. Eulah Pont  . Colonoscopy 2009  . Joint replacement   . Vascular surgery   . Cystoscopy w/ ureteral stent placement 09/23/2011    Procedure: CYSTOSCOPY WITH RETROGRADE PYELOGRAM/URETERAL STENT  PLACEMENT;  Surgeon: Ky Barban;  Location: [facility];  Service: Urology;  Laterality: Right;    Family History  Problem Relation Age of Onset  . Diabetes Sister   . Heart disease Sister   . Diabetes Brother     History  Substance Use Topics  . Smoking status: Never Smoker   . Smokeless tobacco: Never Used  . Alcohol Use: No    OB History    Grav Para Term Preterm Abortions TAB SAB Ect Mult Living                  Review of Systems  Unable to perform ROS: Dementia    Allergies  Codeine and Morphine  Home Medications   Current Outpatient Rx  Name Route Sig Dispense Refill  . ACETAMINOPHEN 500 MG PO TABS Oral Take 500 mg by mouth 2 (two) times daily. Or as needed for headache    . ACETAMINOPHEN 500 MG PO TABS Oral Take 500 mg by mouth every 6 (six) hours as needed. Headache    . AMOXICILLIN 250 MG PO CAPS Oral Take 250 mg by mouth at bedtime.    . ASPIRIN 81 MG PO TABS Oral Take 81 mg by mouth daily.      . ATROPINE SULFATE 1 % OP SOLN Right Eye Place 1 drop into the right eye 2 (  two) times daily.     Marland Kitchen CITALOPRAM HYDROBROMIDE 20 MG PO TABS Oral Take 20 mg by mouth daily.     Marland Kitchen DIFLUPREDNATE 0.05 % OP EMUL Right Eye Place 1 drop into the right eye 2 (two) times daily.     Marland Kitchen DOCUSATE SODIUM 100 MG PO CAPS Oral Take 100 mg by mouth 2 (two) times daily.      . FUROSEMIDE 20 MG PO TABS Oral Take 20 mg by mouth daily.     Marland Kitchen HYPROMELLOSE 2.5 % OP SOLN Both Eyes Place 1 drop into both eyes daily.    . IBANDRONATE SODIUM 150 MG PO TABS Oral Take 150 mg by mouth every 30 (thirty) days. Take in the morning with a full glass of water, on an empty stomach, and do not take anything else by mouth or lie down for the next 30 min. *Administered at MD office    . LORATADINE 10 MG PO TABS Oral Take 10 mg by mouth daily.      Marland Kitchen LOSARTAN POTASSIUM 25 MG PO TABS Oral Take 25 mg by mouth daily.      Marland Kitchen MEMANTINE HCL 10 MG PO TABS Oral Take 10 mg by mouth 2 (two) times daily.      Marland Kitchen  OMEPRAZOLE 40 MG PO CPDR Oral Take 1 capsule (40 mg total) by mouth daily. 30 capsule 12  . POTASSIUM CHLORIDE CRYS ER 10 MEQ PO TBCR Oral Take 10 mEq by mouth daily.    . SUCRALFATE 1 G PO TABS Oral Take 1 g by mouth 4 (four) times daily. *Crushed and mixed with small amount of liquid as directed by physician*    . VITAMIN B COMPLEX-C PO CAPS Oral Take 1 capsule by mouth daily.     Marland Kitchen VITAMIN E 400 UNITS PO CAPS Oral Take 400 Units by mouth daily.        BP 154/62  Pulse 71  Temp 98.2 F (36.8 C) (Oral)  Resp 18  SpO2 97%  Physical Exam  Constitutional: She is oriented to person, place, and time. She appears well-developed and well-nourished. No distress.  HENT:  Head: Normocephalic and atraumatic.  Mouth/Throat: Oropharynx is clear and moist. No oropharyngeal exudate.  Eyes: Conjunctivae and EOM are normal.       Right sided pupil enucleation.  L pupil normal 3mm  Neck: Normal range of motion. Neck supple.       No C-spine pain, step-off or deformity  Cardiovascular: Normal rate, regular rhythm and normal heart sounds.   No murmur heard. Pulmonary/Chest: Effort normal and breath sounds normal. No respiratory distress.  Abdominal: Soft. There is no tenderness. There is no rebound and no guarding.  Musculoskeletal: Normal range of motion. She exhibits no edema and no tenderness.       Right hand with 1 cm laceration to radial side of the distal right thumb 2 cm laceration to third digit overlying the PIP FDS and FDP function intact.  Neurological: She is alert and oriented to person, place, and time. No cranial nerve deficit.  Skin: Skin is warm.    ED Course  LACERATION REPAIR Date/Time: 06/06/2012 8:10 PM Performed by: Glynn Octave Authorized by: Glynn Octave Consent: Verbal consent obtained. Risks and benefits: risks, benefits and alternatives were discussed Consent given by: patient and guardian Patient understanding: patient states understanding of the procedure  being performed Patient identity confirmed: verbally with patient Time out: Immediately prior to procedure a "time out" was called to verify  the correct patient, procedure, equipment, support staff and site/side marked as required. Body area: upper extremity Location details: right long finger Laceration length: 3 cm Tendon involvement: none Nerve involvement: none Vascular damage: no Anesthesia: digital block Local anesthetic: lidocaine 1% without epinephrine Anesthetic total: 4 ml Patient sedated: no Preparation: Patient was prepped and draped in the usual sterile fashion. Irrigation solution: saline Irrigation method: syringe Amount of cleaning: extensive Debridement: none Degree of undermining: none Skin closure: 4-0 nylon Number of sutures: 4 Technique: simple Approximation: close Approximation difficulty: complex Dressing: gauze roll and antibiotic ointment Patient tolerance: Patient tolerated the procedure well with no immediate complications.  LACERATION REPAIR Date/Time: 06/06/2012 8:11 PM Performed by: Glynn Octave Authorized by: Glynn Octave Consent: Verbal consent obtained. Risks and benefits: risks, benefits and alternatives were discussed Consent given by: patient Patient understanding: patient states understanding of the procedure being performed Patient identity confirmed: verbally with patient Time out: Immediately prior to procedure a "time out" was called to verify the correct patient, procedure, equipment, support staff and site/side marked as required. Body area: upper extremity Location details: right thumb Laceration length: 1 cm Tendon involvement: none Nerve involvement: none Vascular damage: no Anesthesia: local infiltration Local anesthetic: lidocaine 1% without epinephrine Anesthetic total: 2 ml Patient sedated: no Preparation: Patient was prepped and draped in the usual sterile fashion. Irrigation solution: saline Irrigation method:  syringe Amount of cleaning: extensive Debridement: none Degree of undermining: none Skin closure: 4-0 nylon Number of sutures: 2 Technique: simple Approximation: close Approximation difficulty: simple Dressing: gauze roll and antibiotic ointment Patient tolerance: Patient tolerated the procedure well with no immediate complications.   (including critical care time)  Labs Reviewed  CBC WITH DIFFERENTIAL - Abnormal; Notable for the following:    RBC 3.63 (*)     Hemoglobin 11.8 (*)     MCV 100.3 (*)     All other components within normal limits  BASIC METABOLIC PANEL - Abnormal; Notable for the following:    Glucose, Bld 165 (*)     BUN 24 (*)     Creatinine, Ser 1.27 (*)     GFR calc non Af Amer 37 (*)     GFR calc Af Amer 43 (*)     All other components within normal limits   Ct Head Wo Contrast  06/06/2012  *RADIOLOGY REPORT*  Clinical Data:  FALL.  CT HEAD WITHOUT CONTRAST CT CERVICAL SPINE WITHOUT CONTRAST  Technique:  Multidetector CT imaging of the head and cervical spine was performed following the standard protocol without IV contrast. Multiplanar CT image reconstructions of the cervical spine were also generated.  Comparison: 01/24/2012 and earlier studies  CT HEAD  Findings: Early bilateral basal ganglia mineralization. Calcifications in the left right globe. Diffuse parenchymal atrophy. Patchy areas of hypoattenuation in deep and periventricular white matter bilaterally. Negative for acute intracranial hemorrhage, mass lesion, acute infarction, midline shift, or mass-effect. Acute infarct may be inapparent on noncontrast CT. Ventricles and sulci symmetric. Bone windows demonstrate no focal lesion.  IMPRESSION:  1. Negative for bleed or other acute intracranial process.  2. Atrophy and nonspecific white matter changes  CT CERVICAL SPINE  Findings: Solid appearing instrumented ACDF C6-7 , hardware intact without surrounding lucency.  2-3 mm anterolisthesis C5-6 as before,  probably related to asymmetric facet degenerative changes at this level, right greater than left.  There is partially calcified pannus about the articulation of the dens with C1.  No definite fracture.  Apparent fusion across the posterior elements  C3-C5 on the right.  Negative for fracture.  Bilateral calcified carotid bifurcation plaque.  Visualized lung apices clear.  IMPRESSION:  1.  Negative for fracture or other acute bony abnormality. 2.  Degenerative and postoperative changes as enumerated above.  Original Report Authenticated By: Osa Craver, M.D.   Ct Cervical Spine Wo Contrast  06/06/2012  *RADIOLOGY REPORT*  Clinical Data:  FALL.  CT HEAD WITHOUT CONTRAST CT CERVICAL SPINE WITHOUT CONTRAST  Technique:  Multidetector CT imaging of the head and cervical spine was performed following the standard protocol without IV contrast. Multiplanar CT image reconstructions of the cervical spine were also generated.  Comparison: 01/24/2012 and earlier studies  CT HEAD  Findings: Early bilateral basal ganglia mineralization. Calcifications in the left right globe. Diffuse parenchymal atrophy. Patchy areas of hypoattenuation in deep and periventricular white matter bilaterally. Negative for acute intracranial hemorrhage, mass lesion, acute infarction, midline shift, or mass-effect. Acute infarct may be inapparent on noncontrast CT. Ventricles and sulci symmetric. Bone windows demonstrate no focal lesion.  IMPRESSION:  1. Negative for bleed or other acute intracranial process.  2. Atrophy and nonspecific white matter changes  CT CERVICAL SPINE  Findings: Solid appearing instrumented ACDF C6-7 , hardware intact without surrounding lucency.  2-3 mm anterolisthesis C5-6 as before, probably related to asymmetric facet degenerative changes at this level, right greater than left.  There is partially calcified pannus about the articulation of the dens with C1.  No definite fracture.  Apparent fusion across the  posterior elements C3-C5 on the right.  Negative for fracture.  Bilateral calcified carotid bifurcation plaque.  Visualized lung apices clear.  IMPRESSION:  1.  Negative for fracture or other acute bony abnormality. 2.  Degenerative and postoperative changes as enumerated above.  Original Report Authenticated By: Osa Craver, M.D.   Dg Hand Complete Right  06/06/2012  *RADIOLOGY REPORT*  Clinical Data: Pain post fall  RIGHT HAND - COMPLETE 3+ VIEW  Comparison: None.  Findings: Mild diffuse osteopenia.  Chondrocalcinosis in the TFCC. Carpal rows intact.  Negative for fracture, dislocation, or other acute bony abnormality.  IMPRESSION: 1.  Negative for fracture or other acute bone injury. 2.  Diffuse osteopenia. 3.  Chondrocalcinosis suggesting CPPD.  Original Report Authenticated By: Osa Craver, M.D.     No diagnosis found.    MDM  Mechanical fall in nursing home patient. No evidence of head injury. Hand lacerations without neurological deficit  Imaging negative for acute injury. Hemoglobin stable.  EKG without arrhythmia. Laceration repairs as above.  Update tetanus.     Date: 06/06/2012  Rate: 75  Rhythm: normal sinus rhythm  QRS Axis: left  Intervals: PR lengthened  ST/T Wave abnormalities: nonspecific ST/T changes  Conduction Disutrbances:none  Narrative Interpretation:   Old EKG Reviewed: unchanged    Glynn Octave, MD 06/06/12 2035

## 2012-06-06 NOTE — ED Notes (Signed)
Pt from [facility]. Pt walking with walker and pt slid down wall. No evidence of hitting head. No LOC. cbg 183. Small lac to r middle finger.pt denies hitting head. nad at this time. Moving all extremities. Pt deneis any pain at this time

## 2012-06-06 NOTE — ED Notes (Signed)
Lacs noted to the right middle & thumb. Bleeding controlled. NAD noted

## 2012-06-06 NOTE — ED Notes (Signed)
Pt alert & oriented x4, stable gait. Patient given discharge instructions, paperwork & prescription(s). Patient  instructed to stop at the registration desk to finish any additional paperwork. Patient verbalized understanding. Pt left department w/ no further questions. 

## 2012-06-08 ENCOUNTER — Ambulatory Visit (INDEPENDENT_AMBULATORY_CARE_PROVIDER_SITE_OTHER): Payer: Medicare Other | Admitting: Urology

## 2012-06-08 DIAGNOSIS — N390 Urinary tract infection, site not specified: Secondary | ICD-10-CM

## 2012-06-08 DIAGNOSIS — N200 Calculus of kidney: Secondary | ICD-10-CM | POA: Diagnosis not present

## 2012-06-14 ENCOUNTER — Ambulatory Visit: Payer: Medicare Other | Admitting: Cardiology

## 2012-06-21 DIAGNOSIS — I70209 Unspecified atherosclerosis of native arteries of extremities, unspecified extremity: Secondary | ICD-10-CM | POA: Diagnosis not present

## 2012-06-22 DIAGNOSIS — G20 Parkinson's disease: Secondary | ICD-10-CM | POA: Diagnosis not present

## 2012-06-22 DIAGNOSIS — I10 Essential (primary) hypertension: Secondary | ICD-10-CM | POA: Diagnosis not present

## 2012-06-22 DIAGNOSIS — E119 Type 2 diabetes mellitus without complications: Secondary | ICD-10-CM | POA: Diagnosis not present

## 2012-06-22 DIAGNOSIS — R262 Difficulty in walking, not elsewhere classified: Secondary | ICD-10-CM | POA: Diagnosis not present

## 2012-06-22 DIAGNOSIS — M810 Age-related osteoporosis without current pathological fracture: Secondary | ICD-10-CM | POA: Diagnosis not present

## 2012-06-22 DIAGNOSIS — F0280 Dementia in other diseases classified elsewhere without behavioral disturbance: Secondary | ICD-10-CM | POA: Diagnosis not present

## 2012-06-22 DIAGNOSIS — R279 Unspecified lack of coordination: Secondary | ICD-10-CM | POA: Diagnosis not present

## 2012-06-22 DIAGNOSIS — M159 Polyosteoarthritis, unspecified: Secondary | ICD-10-CM | POA: Diagnosis not present

## 2012-06-25 DIAGNOSIS — G20 Parkinson's disease: Secondary | ICD-10-CM | POA: Diagnosis not present

## 2012-06-25 DIAGNOSIS — I10 Essential (primary) hypertension: Secondary | ICD-10-CM | POA: Diagnosis not present

## 2012-06-25 DIAGNOSIS — M810 Age-related osteoporosis without current pathological fracture: Secondary | ICD-10-CM | POA: Diagnosis not present

## 2012-06-25 DIAGNOSIS — E119 Type 2 diabetes mellitus without complications: Secondary | ICD-10-CM | POA: Diagnosis not present

## 2012-06-25 DIAGNOSIS — F0280 Dementia in other diseases classified elsewhere without behavioral disturbance: Secondary | ICD-10-CM | POA: Diagnosis not present

## 2012-06-28 DIAGNOSIS — E119 Type 2 diabetes mellitus without complications: Secondary | ICD-10-CM | POA: Diagnosis not present

## 2012-06-28 DIAGNOSIS — F0280 Dementia in other diseases classified elsewhere without behavioral disturbance: Secondary | ICD-10-CM | POA: Diagnosis not present

## 2012-06-28 DIAGNOSIS — G309 Alzheimer's disease, unspecified: Secondary | ICD-10-CM | POA: Diagnosis not present

## 2012-06-28 DIAGNOSIS — I10 Essential (primary) hypertension: Secondary | ICD-10-CM | POA: Diagnosis not present

## 2012-06-28 DIAGNOSIS — G20 Parkinson's disease: Secondary | ICD-10-CM | POA: Diagnosis not present

## 2012-06-28 DIAGNOSIS — M810 Age-related osteoporosis without current pathological fracture: Secondary | ICD-10-CM | POA: Diagnosis not present

## 2012-06-29 DIAGNOSIS — E119 Type 2 diabetes mellitus without complications: Secondary | ICD-10-CM | POA: Diagnosis not present

## 2012-06-29 DIAGNOSIS — G20 Parkinson's disease: Secondary | ICD-10-CM | POA: Diagnosis not present

## 2012-06-29 DIAGNOSIS — M810 Age-related osteoporosis without current pathological fracture: Secondary | ICD-10-CM | POA: Diagnosis not present

## 2012-06-29 DIAGNOSIS — F0280 Dementia in other diseases classified elsewhere without behavioral disturbance: Secondary | ICD-10-CM | POA: Diagnosis not present

## 2012-06-29 DIAGNOSIS — I10 Essential (primary) hypertension: Secondary | ICD-10-CM | POA: Diagnosis not present

## 2012-07-02 DIAGNOSIS — F0280 Dementia in other diseases classified elsewhere without behavioral disturbance: Secondary | ICD-10-CM | POA: Diagnosis not present

## 2012-07-02 DIAGNOSIS — M810 Age-related osteoporosis without current pathological fracture: Secondary | ICD-10-CM | POA: Diagnosis not present

## 2012-07-02 DIAGNOSIS — E119 Type 2 diabetes mellitus without complications: Secondary | ICD-10-CM | POA: Diagnosis not present

## 2012-07-02 DIAGNOSIS — G20 Parkinson's disease: Secondary | ICD-10-CM | POA: Diagnosis not present

## 2012-07-02 DIAGNOSIS — I10 Essential (primary) hypertension: Secondary | ICD-10-CM | POA: Diagnosis not present

## 2012-07-03 DIAGNOSIS — E119 Type 2 diabetes mellitus without complications: Secondary | ICD-10-CM | POA: Diagnosis not present

## 2012-07-03 DIAGNOSIS — I10 Essential (primary) hypertension: Secondary | ICD-10-CM | POA: Diagnosis not present

## 2012-07-03 DIAGNOSIS — G20 Parkinson's disease: Secondary | ICD-10-CM | POA: Diagnosis not present

## 2012-07-05 DIAGNOSIS — M810 Age-related osteoporosis without current pathological fracture: Secondary | ICD-10-CM | POA: Diagnosis not present

## 2012-07-05 DIAGNOSIS — G20 Parkinson's disease: Secondary | ICD-10-CM | POA: Diagnosis not present

## 2012-07-05 DIAGNOSIS — I10 Essential (primary) hypertension: Secondary | ICD-10-CM | POA: Diagnosis not present

## 2012-07-05 DIAGNOSIS — F0280 Dementia in other diseases classified elsewhere without behavioral disturbance: Secondary | ICD-10-CM | POA: Diagnosis not present

## 2012-07-05 DIAGNOSIS — E119 Type 2 diabetes mellitus without complications: Secondary | ICD-10-CM | POA: Diagnosis not present

## 2012-07-05 DIAGNOSIS — G309 Alzheimer's disease, unspecified: Secondary | ICD-10-CM | POA: Diagnosis not present

## 2012-07-06 DIAGNOSIS — F0280 Dementia in other diseases classified elsewhere without behavioral disturbance: Secondary | ICD-10-CM | POA: Diagnosis not present

## 2012-07-06 DIAGNOSIS — E119 Type 2 diabetes mellitus without complications: Secondary | ICD-10-CM | POA: Diagnosis not present

## 2012-07-06 DIAGNOSIS — G20 Parkinson's disease: Secondary | ICD-10-CM | POA: Diagnosis not present

## 2012-07-06 DIAGNOSIS — M810 Age-related osteoporosis without current pathological fracture: Secondary | ICD-10-CM | POA: Diagnosis not present

## 2012-07-06 DIAGNOSIS — I10 Essential (primary) hypertension: Secondary | ICD-10-CM | POA: Diagnosis not present

## 2012-07-06 DIAGNOSIS — G309 Alzheimer's disease, unspecified: Secondary | ICD-10-CM | POA: Diagnosis not present

## 2012-07-10 DIAGNOSIS — E119 Type 2 diabetes mellitus without complications: Secondary | ICD-10-CM | POA: Diagnosis not present

## 2012-07-10 DIAGNOSIS — I10 Essential (primary) hypertension: Secondary | ICD-10-CM | POA: Diagnosis not present

## 2012-07-10 DIAGNOSIS — F0280 Dementia in other diseases classified elsewhere without behavioral disturbance: Secondary | ICD-10-CM | POA: Diagnosis not present

## 2012-07-10 DIAGNOSIS — M810 Age-related osteoporosis without current pathological fracture: Secondary | ICD-10-CM | POA: Diagnosis not present

## 2012-07-10 DIAGNOSIS — G20 Parkinson's disease: Secondary | ICD-10-CM | POA: Diagnosis not present

## 2012-07-11 DIAGNOSIS — M810 Age-related osteoporosis without current pathological fracture: Secondary | ICD-10-CM | POA: Diagnosis not present

## 2012-07-11 DIAGNOSIS — I10 Essential (primary) hypertension: Secondary | ICD-10-CM | POA: Diagnosis not present

## 2012-07-11 DIAGNOSIS — G20 Parkinson's disease: Secondary | ICD-10-CM | POA: Diagnosis not present

## 2012-07-11 DIAGNOSIS — G309 Alzheimer's disease, unspecified: Secondary | ICD-10-CM | POA: Diagnosis not present

## 2012-07-11 DIAGNOSIS — F0280 Dementia in other diseases classified elsewhere without behavioral disturbance: Secondary | ICD-10-CM | POA: Diagnosis not present

## 2012-07-11 DIAGNOSIS — E119 Type 2 diabetes mellitus without complications: Secondary | ICD-10-CM | POA: Diagnosis not present

## 2012-07-12 ENCOUNTER — Encounter: Payer: Self-pay | Admitting: Cardiology

## 2012-07-12 ENCOUNTER — Ambulatory Visit (INDEPENDENT_AMBULATORY_CARE_PROVIDER_SITE_OTHER): Payer: Medicare Other | Admitting: Cardiology

## 2012-07-12 VITALS — BP 140/60 | HR 72 | Ht 61.0 in | Wt 188.1 lb

## 2012-07-12 DIAGNOSIS — K922 Gastrointestinal hemorrhage, unspecified: Secondary | ICD-10-CM

## 2012-07-12 DIAGNOSIS — G20 Parkinson's disease: Secondary | ICD-10-CM | POA: Diagnosis not present

## 2012-07-12 DIAGNOSIS — I10 Essential (primary) hypertension: Secondary | ICD-10-CM

## 2012-07-12 DIAGNOSIS — K449 Diaphragmatic hernia without obstruction or gangrene: Secondary | ICD-10-CM

## 2012-07-12 DIAGNOSIS — N200 Calculus of kidney: Secondary | ICD-10-CM | POA: Diagnosis not present

## 2012-07-12 DIAGNOSIS — I7090 Unspecified atherosclerosis: Secondary | ICD-10-CM

## 2012-07-12 DIAGNOSIS — I2510 Atherosclerotic heart disease of native coronary artery without angina pectoris: Secondary | ICD-10-CM

## 2012-07-12 DIAGNOSIS — K219 Gastro-esophageal reflux disease without esophagitis: Secondary | ICD-10-CM

## 2012-07-12 DIAGNOSIS — E785 Hyperlipidemia, unspecified: Secondary | ICD-10-CM

## 2012-07-12 NOTE — Assessment & Plan Note (Signed)
Patient has probably never been symptomatic as the result of her coronary disease, which was mild when last assessed 7 years ago.  We will continue to attempt to optimally manage cardiovascular risk factors.

## 2012-07-12 NOTE — Assessment & Plan Note (Addendum)
Total/LDL cholesterol extremely low when assessed in 2011; a repeat lipid profile in 02/2012 reported very acceptable but somewhat higher values of 109/41.

## 2012-07-12 NOTE — Progress Notes (Deleted)
Name: [name] [name]    DOB: [birth year]  Age: 78 y.o.  MR#: [id number]       PCP:  Lilyan Punt, MD      Insurance: @PAYORNAME @   CC:   No chief complaint on file.   VS BP 140/60  Pulse 72  Ht 5\' 1"  (1.549 m)  Wt 188 lb 1.9 oz (85.331 kg)  BMI 35.55 kg/m2  Weights Current Weight  07/12/12 188 lb 1.9 oz (85.331 kg)  11/01/11 181 lb (82.101 kg)  09/24/11 195 lb 5.2 oz (88.6 kg)    Blood Pressure  BP Readings from Last 3 Encounters:  07/12/12 140/60  06/06/12 154/62  01/24/12 133/60     Admit date:  (Not on file) Last encounter with RMR:  Visit date not found   Allergy Allergies  Allergen Reactions  . Codeine Other (See Comments)    REACTION: Unknown  . Morphine Nausea And Vomiting    Current Outpatient Prescriptions  Medication Sig Dispense Refill  . acetaminophen (TYLENOL) 500 MG tablet Take 500 mg by mouth 2 (two) times daily. Or as needed for headache      . amoxicillin (AMOXIL) 250 MG capsule Take 250 mg by mouth at bedtime.      Marland Kitchen aspirin 81 MG tablet Take 81 mg by mouth daily.        Marland Kitchen atropine 1 % ophthalmic solution Apply 1 drop to eye 2 (two) times daily.      . chlorhexidine (PERIDEX) 0.12 % solution Use as directed 15 mLs in the mouth or throat as directed.       . citalopram (CELEXA) 20 MG tablet Take 20 mg by mouth daily.       . Difluprednate (DUREZOL) 0.05 % EMUL Apply 1 drop to eye 2 (two) times daily. RIGHT EYE      . docusate sodium ([employer] STOOL SOFTENER) 100 MG capsule Take 100 mg by mouth 2 (two) times daily.        . furosemide (LASIX) 20 MG tablet Take 20 mg by mouth daily.       . hydroxypropyl methylcellulose (ISOPTO TEARS) 2.5 % ophthalmic solution Place 1 drop into both eyes daily.      Marland Kitchen ibandronate (BONIVA) 150 MG tablet Take 150 mg by mouth every 30 (thirty) days. Take in the morning with a full glass of water, on an empty stomach, and do not take anything else by mouth or lie down for the next 30 min. *Administered at MD office       .  loratadine (CLARITIN) 10 MG tablet Take 10 mg by mouth daily.        Marland Kitchen losartan (COZAAR) 25 MG tablet Take 25 mg by mouth daily.        . memantine (NAMENDA) 10 MG tablet Take 10 mg by mouth 2 (two) times daily.        . methylcellulose (ARTIFICIAL TEARS) 1 % ophthalmic solution Apply 1 drop to eye as needed.      Marland Kitchen omeprazole (PRILOSEC) 40 MG capsule Take 1 capsule (40 mg total) by mouth daily.  30 capsule  12  . potassium chloride (K-DUR,KLOR-CON) 10 MEQ tablet Take 10 mEq by mouth daily.      . sucralfate (CARAFATE) 1 G tablet Take 1 g by mouth 4 (four) times daily. *Crushed and mixed with small amount of liquid as directed by physician*      . Vitamin B Complex-C CAPS Take 1 capsule by mouth daily.       Marland Kitchen  vitamin E 400 UNIT capsule Take 400 Units by mouth daily.          Discontinued Meds:    Medications Discontinued During This Encounter  Medication Reason  . potassium chloride (K-DUR) 10 MEQ tablet Duplicate  . acetaminophen (TYLENOL) 500 MG tablet Duplicate  . atropine 1 % ophthalmic solution Completed Course  . Difluprednate (DUREZOL) 0.05 % EMUL Completed Course  . pravastatin (PRAVACHOL) 20 MG tablet Completed Course    Patient Active Problem List  Diagnosis  . HYPERLIPIDEMIA  . PARKINSON'S DISEASE  . HYPERTENSION  . NEPHROLITHIASIS  . ASCVD (arteriosclerotic cardiovascular disease)  . Diabetes mellitus  . Leukopenia  . Degenerative joint disease  . Upper GI bleed  . Retinal artery branch occlusion of right eye  . Gastroesophageal reflux disease with hiatal hernia  . Intracranial bleed  . Frequent falls  . Senile dementia  . Hyperlipidemia  . Acute kidney injury  . Acquired cyst of kidney    LABS Admission on 06/06/2012, Discharged on 06/06/2012  Component Date Value  . WBC 06/06/2012 5.6   . RBC 06/06/2012 3.63*  . Hemoglobin 06/06/2012 11.8*  . HCT 06/06/2012 36.4   . MCV 06/06/2012 100.3*  . [facility] 06/06/2012 32.5   . MCHC 06/06/2012 32.4   . RDW  06/06/2012 13.2   . Platelets 06/06/2012 198   . Neutrophils Relative 06/06/2012 45   . Neutro Abs 06/06/2012 2.5   . Lymphocytes Relative 06/06/2012 41   . Lymphs Abs 06/06/2012 2.3   . Monocytes Relative 06/06/2012 11   . Monocytes Absolute 06/06/2012 0.6   . Eosinophils Relative 06/06/2012 3   . Eosinophils Absolute 06/06/2012 0.1   . Basophils Relative 06/06/2012 1   . Basophils Absolute 06/06/2012 0.0   . Sodium 06/06/2012 137   . Potassium 06/06/2012 4.2   . Chloride 06/06/2012 101   . CO2 06/06/2012 29   . Glucose, Bld 06/06/2012 165*  . BUN 06/06/2012 24*  . Creatinine, Ser 06/06/2012 1.27*  . Calcium 06/06/2012 9.3   . GFR calc non Af Amer 06/06/2012 37*  . GFR calc Af Amer 06/06/2012 43*     Results for this Opt Visit:     Results for orders placed during the hospital encounter of 06/06/12  CBC WITH DIFFERENTIAL      Component Value Range   WBC 5.6  4.0 - 10.5 K/uL   RBC 3.63 (*) 3.87 - 5.11 MIL/uL   Hemoglobin 11.8 (*) 12.0 - 15.0 g/dL   HCT 40.9  81.1 - 91.4 %   MCV 100.3 (*) 78.0 - 100.0 fL   MCH 32.5  26.0 - 34.0 pg   MCHC 32.4  30.0 - 36.0 g/dL   RDW 78.2  95.6 - 21.3 %   Platelets 198  150 - 400 K/uL   Neutrophils Relative 45  43 - 77 %   Neutro Abs 2.5  1.7 - 7.7 K/uL   Lymphocytes Relative 41  12 - 46 %   Lymphs Abs 2.3  0.7 - 4.0 K/uL   Monocytes Relative 11  3 - 12 %   Monocytes Absolute 0.6  0.1 - 1.0 K/uL   Eosinophils Relative 3  0 - 5 %   Eosinophils Absolute 0.1  0.0 - 0.7 K/uL   Basophils Relative 1  0 - 1 %   Basophils Absolute 0.0  0.0 - 0.1 K/uL  BASIC METABOLIC PANEL      Component Value Range   Sodium 137  135 -  145 mEq/L   Potassium 4.2  3.5 - 5.1 mEq/L   Chloride 101  96 - 112 mEq/L   CO2 29  19 - 32 mEq/L   Glucose, Bld 165 (*) 70 - 99 mg/dL   BUN 24 (*) 6 - 23 mg/dL   Creatinine, Ser 1.61 (*) 0.50 - 1.10 mg/dL   Calcium 9.3  8.4 - 09.6 mg/dL   GFR calc non Af Amer 37 (*) >90 mL/min   GFR calc Af Amer 43 (*) >90 mL/min     EKG Orders placed during the hospital encounter of 06/06/12  . ED EKG  . ED EKG  . EKG 12-LEAD  . EKG 12-LEAD  . EKG     Prior Assessment and Plan Problem List as of 07/12/2012            Cardiology Problems   HYPERLIPIDEMIA   Last Assessment & Plan Note   06/07/2011 Office Visit Signed 07/07/2011  4:52 PM by Kathlen Brunswick, MD    Most recent lipid values extremely low on low dose pravastatin, which will be continued.      HYPERTENSION   Last Assessment & Plan Note   06/07/2011 Office Visit Signed 07/07/2011  4:53 PM by Kathlen Brunswick, MD    BP control good with current meds, which will be continued.    ASCVD (arteriosclerotic cardiovascular disease)   Last Assessment & Plan Note   06/07/2011 Office Visit Signed 07/07/2011  4:50 PM by Kathlen Brunswick, MD    No symptoms to suggest active myocardial ischemia.    Retinal artery branch occlusion of right eye   Hyperlipidemia     Other   Senile dementia   Last Assessment & Plan Note   06/07/2011 Office Visit Signed 07/07/2011  4:51 PM by Kathlen Brunswick, MD    Pt. Is less spontaneous, less talkative and less engaged than in the past, but functional status is still good and progression of disease slow.    PARKINSON'S DISEASE   NEPHROLITHIASIS   Diabetes mellitus   Leukopenia   Degenerative joint disease   Upper GI bleed   Gastroesophageal reflux disease with hiatal hernia   Intracranial bleed   Frequent falls   Last Assessment & Plan Note   06/07/2011 Office Visit Signed 07/07/2011  4:51 PM by Kathlen Brunswick, MD    None recently.    Acute kidney injury   Acquired cyst of kidney       Imaging: No results found.   FRS Calculation: Score not calculated

## 2012-07-12 NOTE — Assessment & Plan Note (Signed)
Blood pressure has been generally well-controlled in recent months.  Current therapy will be continued.

## 2012-07-12 NOTE — Patient Instructions (Addendum)
Your physician recommends that you schedule a follow-up appointment in: 1 year follow up  

## 2012-07-12 NOTE — Progress Notes (Signed)
Patient ID: [name] [name], female   DOB: 1923-12-08, 78 y.o.   MRN: [id number]  HPI: Scheduled return visit for this very nice woman who I follow for coronary disease and cardiovascular risk factors.   She has had no cardiac testing since 2006 when single-vessel disease with a 60% circumflex lesion was present.  She currently has no symptoms to suggest myocardial ischemia.  She is residing at [facility], has had problems with frequent falls in the past, and unfortunately with recurrent falls albeit at a reduced rate.  Fortunately, she has not suffered any serious morbidity related to those events.  In recent months, she sustained a soft tissue injury to her hip that did not require hospital evaluation and a laceration of the right hand that did.  Prior to Admission medications   Medication Sig Start Date End Date Taking? Authorizing Provider  acetaminophen (TYLENOL) 500 MG tablet Take 500 mg by mouth 2 (two) times daily. Or as needed for headache   Yes Historical Provider, MD  amoxicillin (AMOXIL) 250 MG capsule Take 250 mg by mouth at bedtime.   Yes Historical Provider, MD  aspirin 81 MG tablet Take 81 mg by mouth daily.   06/27/11  Yes Hollice Espy, MD  atropine 1 % ophthalmic solution Apply 1 drop to eye 2 (two) times daily.   Yes Historical Provider, MD  chlorhexidine (PERIDEX) 0.12 % solution Use as directed 15 mLs in the mouth or throat as directed.  07/09/12  Yes Historical Provider, MD  citalopram (CELEXA) 20 MG tablet Take 20 mg by mouth daily.    Yes Historical Provider, MD  Difluprednate (DUREZOL) 0.05 % EMUL Apply 1 drop to eye 2 (two) times daily. RIGHT EYE   Yes Historical Provider, MD  docusate sodium ([employer] STOOL SOFTENER) 100 MG capsule Take 100 mg by mouth 2 (two) times daily.     Yes Historical Provider, MD  furosemide (LASIX) 20 MG tablet Take 20 mg by mouth daily.  08/19/11  Yes Historical Provider, MD  hydroxypropyl methylcellulose (ISOPTO TEARS) 2.5 % ophthalmic solution  Place 1 drop into both eyes daily.   Yes Historical Provider, MD  ibandronate (BONIVA) 150 MG tablet Take 150 mg by mouth every 30 (thirty) days. Take in the morning with a full glass of water, on an empty stomach, and do not take anything else by mouth or lie down for the next 30 min. *Administered at MD office    Yes Historical Provider, MD  loratadine (CLARITIN) 10 MG tablet Take 10 mg by mouth daily.     Yes Historical Provider, MD  losartan (COZAAR) 25 MG tablet Take 25 mg by mouth daily.     Yes Historical Provider, MD  memantine (NAMENDA) 10 MG tablet Take 10 mg by mouth 2 (two) times daily.     Yes Historical Provider, MD  methylcellulose (ARTIFICIAL TEARS) 1 % ophthalmic solution Apply 1 drop to eye as needed.   Yes Historical Provider, MD  omeprazole (PRILOSEC) 40 MG capsule Take 1 capsule (40 mg total) by mouth daily. 11/01/11 10/31/12 Yes Len Blalock, NP  potassium chloride (K-DUR,KLOR-CON) 10 MEQ tablet Take 10 mEq by mouth daily.   Yes Historical Provider, MD  sucralfate (CARAFATE) 1 G tablet Take 1 g by mouth 4 (four) times daily. *Crushed and mixed with small amount of liquid as directed by physician*   Yes Historical Provider, MD  Vitamin B Complex-C CAPS Take 1 capsule by mouth daily.    Yes Historical  Provider, MD  vitamin E 400 UNIT capsule Take 400 Units by mouth daily.     Yes Historical Provider, MD   Allergies  Allergen Reactions  . Codeine Other (See Comments)    REACTION: Unknown  . Morphine Nausea And Vomiting     Past medical history, social history, and family history reviewed and updated.  ROS: Denies chest pain, dyspnea, orthopnea, PND, syncope or palpitations.  She has chronic but very mild edema.  All other systems reviewed and are negative.  PHYSICAL EXAM: BP 140/60  Pulse 72  Ht 5\' 1"  (1.549 m)  Wt 85.331 kg (188 lb 1.9 oz)  BMI 35.55 kg/m2  General-Well developed; no acute distress Body habitus-Overweight Neck-No JVD; no carotid bruits Lungs-clear  lung fields; resonant to percussion Cardiovascular-normal PMI; normal S1 and S2 Abdomen-normal bowel sounds; soft and non-tender without masses or organomegaly; aortic pulsation not palpable Musculoskeletal-No deformities, no cyanosis or clubbing Neurologic-Normal cranial nerves; symmetric strength and tone; impaired orientation and memory Skin-Warm, no significant lesions Extremities-distal pulses intact; 1/2+ ankle edema  ASSESSMENT AND PLAN:  [place] Bing, MD 07/12/2012 2:46 PM

## 2012-07-13 DIAGNOSIS — M810 Age-related osteoporosis without current pathological fracture: Secondary | ICD-10-CM | POA: Diagnosis not present

## 2012-07-13 DIAGNOSIS — G20 Parkinson's disease: Secondary | ICD-10-CM | POA: Diagnosis not present

## 2012-07-13 DIAGNOSIS — F0280 Dementia in other diseases classified elsewhere without behavioral disturbance: Secondary | ICD-10-CM | POA: Diagnosis not present

## 2012-07-13 DIAGNOSIS — I10 Essential (primary) hypertension: Secondary | ICD-10-CM | POA: Diagnosis not present

## 2012-07-13 DIAGNOSIS — E119 Type 2 diabetes mellitus without complications: Secondary | ICD-10-CM | POA: Diagnosis not present

## 2012-07-16 DIAGNOSIS — G20 Parkinson's disease: Secondary | ICD-10-CM | POA: Diagnosis not present

## 2012-07-16 DIAGNOSIS — E119 Type 2 diabetes mellitus without complications: Secondary | ICD-10-CM | POA: Diagnosis not present

## 2012-07-16 DIAGNOSIS — M810 Age-related osteoporosis without current pathological fracture: Secondary | ICD-10-CM | POA: Diagnosis not present

## 2012-07-16 DIAGNOSIS — I10 Essential (primary) hypertension: Secondary | ICD-10-CM | POA: Diagnosis not present

## 2012-07-16 DIAGNOSIS — F0280 Dementia in other diseases classified elsewhere without behavioral disturbance: Secondary | ICD-10-CM | POA: Diagnosis not present

## 2012-07-19 DIAGNOSIS — G309 Alzheimer's disease, unspecified: Secondary | ICD-10-CM | POA: Diagnosis not present

## 2012-07-19 DIAGNOSIS — I10 Essential (primary) hypertension: Secondary | ICD-10-CM | POA: Diagnosis not present

## 2012-07-19 DIAGNOSIS — F0280 Dementia in other diseases classified elsewhere without behavioral disturbance: Secondary | ICD-10-CM | POA: Diagnosis not present

## 2012-07-19 DIAGNOSIS — E119 Type 2 diabetes mellitus without complications: Secondary | ICD-10-CM | POA: Diagnosis not present

## 2012-07-19 DIAGNOSIS — G20 Parkinson's disease: Secondary | ICD-10-CM | POA: Diagnosis not present

## 2012-07-19 DIAGNOSIS — M810 Age-related osteoporosis without current pathological fracture: Secondary | ICD-10-CM | POA: Diagnosis not present

## 2012-07-24 DIAGNOSIS — G20 Parkinson's disease: Secondary | ICD-10-CM | POA: Diagnosis not present

## 2012-07-24 DIAGNOSIS — E119 Type 2 diabetes mellitus without complications: Secondary | ICD-10-CM | POA: Diagnosis not present

## 2012-07-24 DIAGNOSIS — M810 Age-related osteoporosis without current pathological fracture: Secondary | ICD-10-CM | POA: Diagnosis not present

## 2012-07-24 DIAGNOSIS — F0280 Dementia in other diseases classified elsewhere without behavioral disturbance: Secondary | ICD-10-CM | POA: Diagnosis not present

## 2012-07-24 DIAGNOSIS — I10 Essential (primary) hypertension: Secondary | ICD-10-CM | POA: Diagnosis not present

## 2012-07-26 DIAGNOSIS — M810 Age-related osteoporosis without current pathological fracture: Secondary | ICD-10-CM | POA: Diagnosis not present

## 2012-07-26 DIAGNOSIS — G309 Alzheimer's disease, unspecified: Secondary | ICD-10-CM | POA: Diagnosis not present

## 2012-07-26 DIAGNOSIS — F0280 Dementia in other diseases classified elsewhere without behavioral disturbance: Secondary | ICD-10-CM | POA: Diagnosis not present

## 2012-07-26 DIAGNOSIS — E119 Type 2 diabetes mellitus without complications: Secondary | ICD-10-CM | POA: Diagnosis not present

## 2012-07-26 DIAGNOSIS — G20 Parkinson's disease: Secondary | ICD-10-CM | POA: Diagnosis not present

## 2012-07-26 DIAGNOSIS — I10 Essential (primary) hypertension: Secondary | ICD-10-CM | POA: Diagnosis not present

## 2012-07-31 DIAGNOSIS — M159 Polyosteoarthritis, unspecified: Secondary | ICD-10-CM | POA: Diagnosis not present

## 2012-07-31 DIAGNOSIS — R0989 Other specified symptoms and signs involving the circulatory and respiratory systems: Secondary | ICD-10-CM | POA: Diagnosis not present

## 2012-07-31 DIAGNOSIS — E119 Type 2 diabetes mellitus without complications: Secondary | ICD-10-CM | POA: Diagnosis not present

## 2012-07-31 DIAGNOSIS — G20 Parkinson's disease: Secondary | ICD-10-CM | POA: Diagnosis not present

## 2012-07-31 DIAGNOSIS — G309 Alzheimer's disease, unspecified: Secondary | ICD-10-CM | POA: Diagnosis not present

## 2012-07-31 DIAGNOSIS — R279 Unspecified lack of coordination: Secondary | ICD-10-CM | POA: Diagnosis not present

## 2012-07-31 DIAGNOSIS — M6281 Muscle weakness (generalized): Secondary | ICD-10-CM | POA: Diagnosis not present

## 2012-07-31 DIAGNOSIS — R262 Difficulty in walking, not elsewhere classified: Secondary | ICD-10-CM | POA: Diagnosis not present

## 2012-07-31 DIAGNOSIS — F0280 Dementia in other diseases classified elsewhere without behavioral disturbance: Secondary | ICD-10-CM | POA: Diagnosis not present

## 2012-08-01 DIAGNOSIS — E119 Type 2 diabetes mellitus without complications: Secondary | ICD-10-CM | POA: Diagnosis not present

## 2012-08-01 DIAGNOSIS — F0280 Dementia in other diseases classified elsewhere without behavioral disturbance: Secondary | ICD-10-CM | POA: Diagnosis not present

## 2012-08-01 DIAGNOSIS — R262 Difficulty in walking, not elsewhere classified: Secondary | ICD-10-CM | POA: Diagnosis not present

## 2012-08-01 DIAGNOSIS — G309 Alzheimer's disease, unspecified: Secondary | ICD-10-CM | POA: Diagnosis not present

## 2012-08-01 DIAGNOSIS — G20 Parkinson's disease: Secondary | ICD-10-CM | POA: Diagnosis not present

## 2012-08-01 DIAGNOSIS — R279 Unspecified lack of coordination: Secondary | ICD-10-CM | POA: Diagnosis not present

## 2012-08-01 DIAGNOSIS — M159 Polyosteoarthritis, unspecified: Secondary | ICD-10-CM | POA: Diagnosis not present

## 2012-08-02 DIAGNOSIS — R279 Unspecified lack of coordination: Secondary | ICD-10-CM | POA: Diagnosis not present

## 2012-08-02 DIAGNOSIS — F0280 Dementia in other diseases classified elsewhere without behavioral disturbance: Secondary | ICD-10-CM | POA: Diagnosis not present

## 2012-08-02 DIAGNOSIS — M159 Polyosteoarthritis, unspecified: Secondary | ICD-10-CM | POA: Diagnosis not present

## 2012-08-02 DIAGNOSIS — G20 Parkinson's disease: Secondary | ICD-10-CM | POA: Diagnosis not present

## 2012-08-02 DIAGNOSIS — R262 Difficulty in walking, not elsewhere classified: Secondary | ICD-10-CM | POA: Diagnosis not present

## 2012-08-02 DIAGNOSIS — E119 Type 2 diabetes mellitus without complications: Secondary | ICD-10-CM | POA: Diagnosis not present

## 2012-08-03 DIAGNOSIS — M159 Polyosteoarthritis, unspecified: Secondary | ICD-10-CM | POA: Diagnosis not present

## 2012-08-03 DIAGNOSIS — E119 Type 2 diabetes mellitus without complications: Secondary | ICD-10-CM | POA: Diagnosis not present

## 2012-08-03 DIAGNOSIS — R262 Difficulty in walking, not elsewhere classified: Secondary | ICD-10-CM | POA: Diagnosis not present

## 2012-08-03 DIAGNOSIS — R279 Unspecified lack of coordination: Secondary | ICD-10-CM | POA: Diagnosis not present

## 2012-08-03 DIAGNOSIS — F0280 Dementia in other diseases classified elsewhere without behavioral disturbance: Secondary | ICD-10-CM | POA: Diagnosis not present

## 2012-08-03 DIAGNOSIS — G20 Parkinson's disease: Secondary | ICD-10-CM | POA: Diagnosis not present

## 2012-08-06 DIAGNOSIS — R262 Difficulty in walking, not elsewhere classified: Secondary | ICD-10-CM | POA: Diagnosis not present

## 2012-08-06 DIAGNOSIS — E119 Type 2 diabetes mellitus without complications: Secondary | ICD-10-CM | POA: Diagnosis not present

## 2012-08-06 DIAGNOSIS — M159 Polyosteoarthritis, unspecified: Secondary | ICD-10-CM | POA: Diagnosis not present

## 2012-08-06 DIAGNOSIS — G20 Parkinson's disease: Secondary | ICD-10-CM | POA: Diagnosis not present

## 2012-08-06 DIAGNOSIS — F0280 Dementia in other diseases classified elsewhere without behavioral disturbance: Secondary | ICD-10-CM | POA: Diagnosis not present

## 2012-08-06 DIAGNOSIS — R279 Unspecified lack of coordination: Secondary | ICD-10-CM | POA: Diagnosis not present

## 2012-08-07 DIAGNOSIS — G20 Parkinson's disease: Secondary | ICD-10-CM | POA: Diagnosis not present

## 2012-08-07 DIAGNOSIS — G309 Alzheimer's disease, unspecified: Secondary | ICD-10-CM | POA: Diagnosis not present

## 2012-08-07 DIAGNOSIS — R262 Difficulty in walking, not elsewhere classified: Secondary | ICD-10-CM | POA: Diagnosis not present

## 2012-08-07 DIAGNOSIS — R279 Unspecified lack of coordination: Secondary | ICD-10-CM | POA: Diagnosis not present

## 2012-08-07 DIAGNOSIS — M159 Polyosteoarthritis, unspecified: Secondary | ICD-10-CM | POA: Diagnosis not present

## 2012-08-07 DIAGNOSIS — E119 Type 2 diabetes mellitus without complications: Secondary | ICD-10-CM | POA: Diagnosis not present

## 2012-08-09 DIAGNOSIS — R279 Unspecified lack of coordination: Secondary | ICD-10-CM | POA: Diagnosis not present

## 2012-08-09 DIAGNOSIS — F0280 Dementia in other diseases classified elsewhere without behavioral disturbance: Secondary | ICD-10-CM | POA: Diagnosis not present

## 2012-08-09 DIAGNOSIS — R262 Difficulty in walking, not elsewhere classified: Secondary | ICD-10-CM | POA: Diagnosis not present

## 2012-08-09 DIAGNOSIS — G309 Alzheimer's disease, unspecified: Secondary | ICD-10-CM | POA: Diagnosis not present

## 2012-08-09 DIAGNOSIS — G20 Parkinson's disease: Secondary | ICD-10-CM | POA: Diagnosis not present

## 2012-08-09 DIAGNOSIS — E119 Type 2 diabetes mellitus without complications: Secondary | ICD-10-CM | POA: Diagnosis not present

## 2012-08-09 DIAGNOSIS — M159 Polyosteoarthritis, unspecified: Secondary | ICD-10-CM | POA: Diagnosis not present

## 2012-08-10 DIAGNOSIS — M159 Polyosteoarthritis, unspecified: Secondary | ICD-10-CM | POA: Diagnosis not present

## 2012-08-10 DIAGNOSIS — E119 Type 2 diabetes mellitus without complications: Secondary | ICD-10-CM | POA: Diagnosis not present

## 2012-08-10 DIAGNOSIS — R262 Difficulty in walking, not elsewhere classified: Secondary | ICD-10-CM | POA: Diagnosis not present

## 2012-08-10 DIAGNOSIS — G20 Parkinson's disease: Secondary | ICD-10-CM | POA: Diagnosis not present

## 2012-08-10 DIAGNOSIS — R279 Unspecified lack of coordination: Secondary | ICD-10-CM | POA: Diagnosis not present

## 2012-08-10 DIAGNOSIS — F0280 Dementia in other diseases classified elsewhere without behavioral disturbance: Secondary | ICD-10-CM | POA: Diagnosis not present

## 2012-08-13 DIAGNOSIS — R262 Difficulty in walking, not elsewhere classified: Secondary | ICD-10-CM | POA: Diagnosis not present

## 2012-08-13 DIAGNOSIS — G309 Alzheimer's disease, unspecified: Secondary | ICD-10-CM | POA: Diagnosis not present

## 2012-08-13 DIAGNOSIS — M159 Polyosteoarthritis, unspecified: Secondary | ICD-10-CM | POA: Diagnosis not present

## 2012-08-13 DIAGNOSIS — E119 Type 2 diabetes mellitus without complications: Secondary | ICD-10-CM | POA: Diagnosis not present

## 2012-08-13 DIAGNOSIS — R279 Unspecified lack of coordination: Secondary | ICD-10-CM | POA: Diagnosis not present

## 2012-08-13 DIAGNOSIS — G20 Parkinson's disease: Secondary | ICD-10-CM | POA: Diagnosis not present

## 2012-08-14 DIAGNOSIS — R279 Unspecified lack of coordination: Secondary | ICD-10-CM | POA: Diagnosis not present

## 2012-08-14 DIAGNOSIS — G20 Parkinson's disease: Secondary | ICD-10-CM | POA: Diagnosis not present

## 2012-08-14 DIAGNOSIS — M159 Polyosteoarthritis, unspecified: Secondary | ICD-10-CM | POA: Diagnosis not present

## 2012-08-14 DIAGNOSIS — F0280 Dementia in other diseases classified elsewhere without behavioral disturbance: Secondary | ICD-10-CM | POA: Diagnosis not present

## 2012-08-14 DIAGNOSIS — R262 Difficulty in walking, not elsewhere classified: Secondary | ICD-10-CM | POA: Diagnosis not present

## 2012-08-14 DIAGNOSIS — E119 Type 2 diabetes mellitus without complications: Secondary | ICD-10-CM | POA: Diagnosis not present

## 2012-08-15 DIAGNOSIS — M159 Polyosteoarthritis, unspecified: Secondary | ICD-10-CM | POA: Diagnosis not present

## 2012-08-15 DIAGNOSIS — R262 Difficulty in walking, not elsewhere classified: Secondary | ICD-10-CM | POA: Diagnosis not present

## 2012-08-15 DIAGNOSIS — J209 Acute bronchitis, unspecified: Secondary | ICD-10-CM | POA: Diagnosis not present

## 2012-08-15 DIAGNOSIS — E119 Type 2 diabetes mellitus without complications: Secondary | ICD-10-CM | POA: Diagnosis not present

## 2012-08-15 DIAGNOSIS — G309 Alzheimer's disease, unspecified: Secondary | ICD-10-CM | POA: Diagnosis not present

## 2012-08-15 DIAGNOSIS — G20 Parkinson's disease: Secondary | ICD-10-CM | POA: Diagnosis not present

## 2012-08-15 DIAGNOSIS — R279 Unspecified lack of coordination: Secondary | ICD-10-CM | POA: Diagnosis not present

## 2012-08-20 DIAGNOSIS — R279 Unspecified lack of coordination: Secondary | ICD-10-CM | POA: Diagnosis not present

## 2012-08-20 DIAGNOSIS — G309 Alzheimer's disease, unspecified: Secondary | ICD-10-CM | POA: Diagnosis not present

## 2012-08-20 DIAGNOSIS — E119 Type 2 diabetes mellitus without complications: Secondary | ICD-10-CM | POA: Diagnosis not present

## 2012-08-20 DIAGNOSIS — M159 Polyosteoarthritis, unspecified: Secondary | ICD-10-CM | POA: Diagnosis not present

## 2012-08-20 DIAGNOSIS — R262 Difficulty in walking, not elsewhere classified: Secondary | ICD-10-CM | POA: Diagnosis not present

## 2012-08-20 DIAGNOSIS — G20 Parkinson's disease: Secondary | ICD-10-CM | POA: Diagnosis not present

## 2012-08-21 DIAGNOSIS — M6281 Muscle weakness (generalized): Secondary | ICD-10-CM | POA: Diagnosis not present

## 2012-08-21 DIAGNOSIS — F0280 Dementia in other diseases classified elsewhere without behavioral disturbance: Secondary | ICD-10-CM | POA: Diagnosis not present

## 2012-08-21 DIAGNOSIS — F0390 Unspecified dementia without behavioral disturbance: Secondary | ICD-10-CM | POA: Diagnosis not present

## 2012-08-21 DIAGNOSIS — R279 Unspecified lack of coordination: Secondary | ICD-10-CM | POA: Diagnosis not present

## 2012-08-21 DIAGNOSIS — M159 Polyosteoarthritis, unspecified: Secondary | ICD-10-CM | POA: Diagnosis not present

## 2012-08-21 DIAGNOSIS — R0609 Other forms of dyspnea: Secondary | ICD-10-CM | POA: Diagnosis not present

## 2012-08-21 DIAGNOSIS — R262 Difficulty in walking, not elsewhere classified: Secondary | ICD-10-CM | POA: Diagnosis not present

## 2012-08-21 DIAGNOSIS — E119 Type 2 diabetes mellitus without complications: Secondary | ICD-10-CM | POA: Diagnosis not present

## 2012-08-21 DIAGNOSIS — G20 Parkinson's disease: Secondary | ICD-10-CM | POA: Diagnosis not present

## 2012-08-21 DIAGNOSIS — R1311 Dysphagia, oral phase: Secondary | ICD-10-CM | POA: Diagnosis not present

## 2012-08-23 DIAGNOSIS — Z23 Encounter for immunization: Secondary | ICD-10-CM | POA: Diagnosis not present

## 2012-08-23 DIAGNOSIS — N390 Urinary tract infection, site not specified: Secondary | ICD-10-CM | POA: Diagnosis not present

## 2012-08-23 DIAGNOSIS — M899 Disorder of bone, unspecified: Secondary | ICD-10-CM | POA: Diagnosis not present

## 2012-08-23 DIAGNOSIS — M949 Disorder of cartilage, unspecified: Secondary | ICD-10-CM | POA: Diagnosis not present

## 2012-08-30 DIAGNOSIS — I739 Peripheral vascular disease, unspecified: Secondary | ICD-10-CM | POA: Diagnosis not present

## 2012-09-21 DIAGNOSIS — F0280 Dementia in other diseases classified elsewhere without behavioral disturbance: Secondary | ICD-10-CM | POA: Diagnosis not present

## 2012-09-21 DIAGNOSIS — R0609 Other forms of dyspnea: Secondary | ICD-10-CM | POA: Diagnosis not present

## 2012-09-21 DIAGNOSIS — G20 Parkinson's disease: Secondary | ICD-10-CM | POA: Diagnosis not present

## 2012-09-21 DIAGNOSIS — R262 Difficulty in walking, not elsewhere classified: Secondary | ICD-10-CM | POA: Diagnosis not present

## 2012-09-21 DIAGNOSIS — R279 Unspecified lack of coordination: Secondary | ICD-10-CM | POA: Diagnosis not present

## 2012-09-21 DIAGNOSIS — E119 Type 2 diabetes mellitus without complications: Secondary | ICD-10-CM | POA: Diagnosis not present

## 2012-09-21 DIAGNOSIS — M6281 Muscle weakness (generalized): Secondary | ICD-10-CM | POA: Diagnosis not present

## 2012-09-21 DIAGNOSIS — F0390 Unspecified dementia without behavioral disturbance: Secondary | ICD-10-CM | POA: Diagnosis not present

## 2012-09-21 DIAGNOSIS — R1311 Dysphagia, oral phase: Secondary | ICD-10-CM | POA: Diagnosis not present

## 2012-09-21 DIAGNOSIS — M159 Polyosteoarthritis, unspecified: Secondary | ICD-10-CM | POA: Diagnosis not present

## 2012-09-25 DIAGNOSIS — R350 Frequency of micturition: Secondary | ICD-10-CM | POA: Diagnosis not present

## 2012-09-25 DIAGNOSIS — R4182 Altered mental status, unspecified: Secondary | ICD-10-CM | POA: Diagnosis not present

## 2012-10-22 DIAGNOSIS — G309 Alzheimer's disease, unspecified: Secondary | ICD-10-CM | POA: Diagnosis not present

## 2012-10-22 DIAGNOSIS — G20 Parkinson's disease: Secondary | ICD-10-CM | POA: Diagnosis not present

## 2012-10-22 DIAGNOSIS — R279 Unspecified lack of coordination: Secondary | ICD-10-CM | POA: Diagnosis not present

## 2012-10-22 DIAGNOSIS — F0280 Dementia in other diseases classified elsewhere without behavioral disturbance: Secondary | ICD-10-CM | POA: Diagnosis not present

## 2012-10-22 DIAGNOSIS — E119 Type 2 diabetes mellitus without complications: Secondary | ICD-10-CM | POA: Diagnosis not present

## 2012-10-22 DIAGNOSIS — M6281 Muscle weakness (generalized): Secondary | ICD-10-CM | POA: Diagnosis not present

## 2012-10-22 DIAGNOSIS — R262 Difficulty in walking, not elsewhere classified: Secondary | ICD-10-CM | POA: Diagnosis not present

## 2012-10-22 DIAGNOSIS — M159 Polyosteoarthritis, unspecified: Secondary | ICD-10-CM | POA: Diagnosis not present

## 2012-10-22 DIAGNOSIS — R0609 Other forms of dyspnea: Secondary | ICD-10-CM | POA: Diagnosis not present

## 2012-10-23 DIAGNOSIS — R279 Unspecified lack of coordination: Secondary | ICD-10-CM | POA: Diagnosis not present

## 2012-10-23 DIAGNOSIS — G20 Parkinson's disease: Secondary | ICD-10-CM | POA: Diagnosis not present

## 2012-10-23 DIAGNOSIS — F0280 Dementia in other diseases classified elsewhere without behavioral disturbance: Secondary | ICD-10-CM | POA: Diagnosis not present

## 2012-10-23 DIAGNOSIS — E119 Type 2 diabetes mellitus without complications: Secondary | ICD-10-CM | POA: Diagnosis not present

## 2012-10-23 DIAGNOSIS — R262 Difficulty in walking, not elsewhere classified: Secondary | ICD-10-CM | POA: Diagnosis not present

## 2012-10-23 DIAGNOSIS — M159 Polyosteoarthritis, unspecified: Secondary | ICD-10-CM | POA: Diagnosis not present

## 2012-10-24 ENCOUNTER — Encounter (INDEPENDENT_AMBULATORY_CARE_PROVIDER_SITE_OTHER): Payer: Self-pay | Admitting: *Deleted

## 2012-10-25 DIAGNOSIS — N9089 Other specified noninflammatory disorders of vulva and perineum: Secondary | ICD-10-CM | POA: Diagnosis not present

## 2012-10-30 IMAGING — CR DG HAND COMPLETE 3+V*R*
3 series · 3 of 3 positions shown · non-contrast
Comparison: None.

CLINICAL DATA: Pain post fall

RIGHT HAND - COMPLETE 3+ VIEW

[view not recorded (1 of 3)]
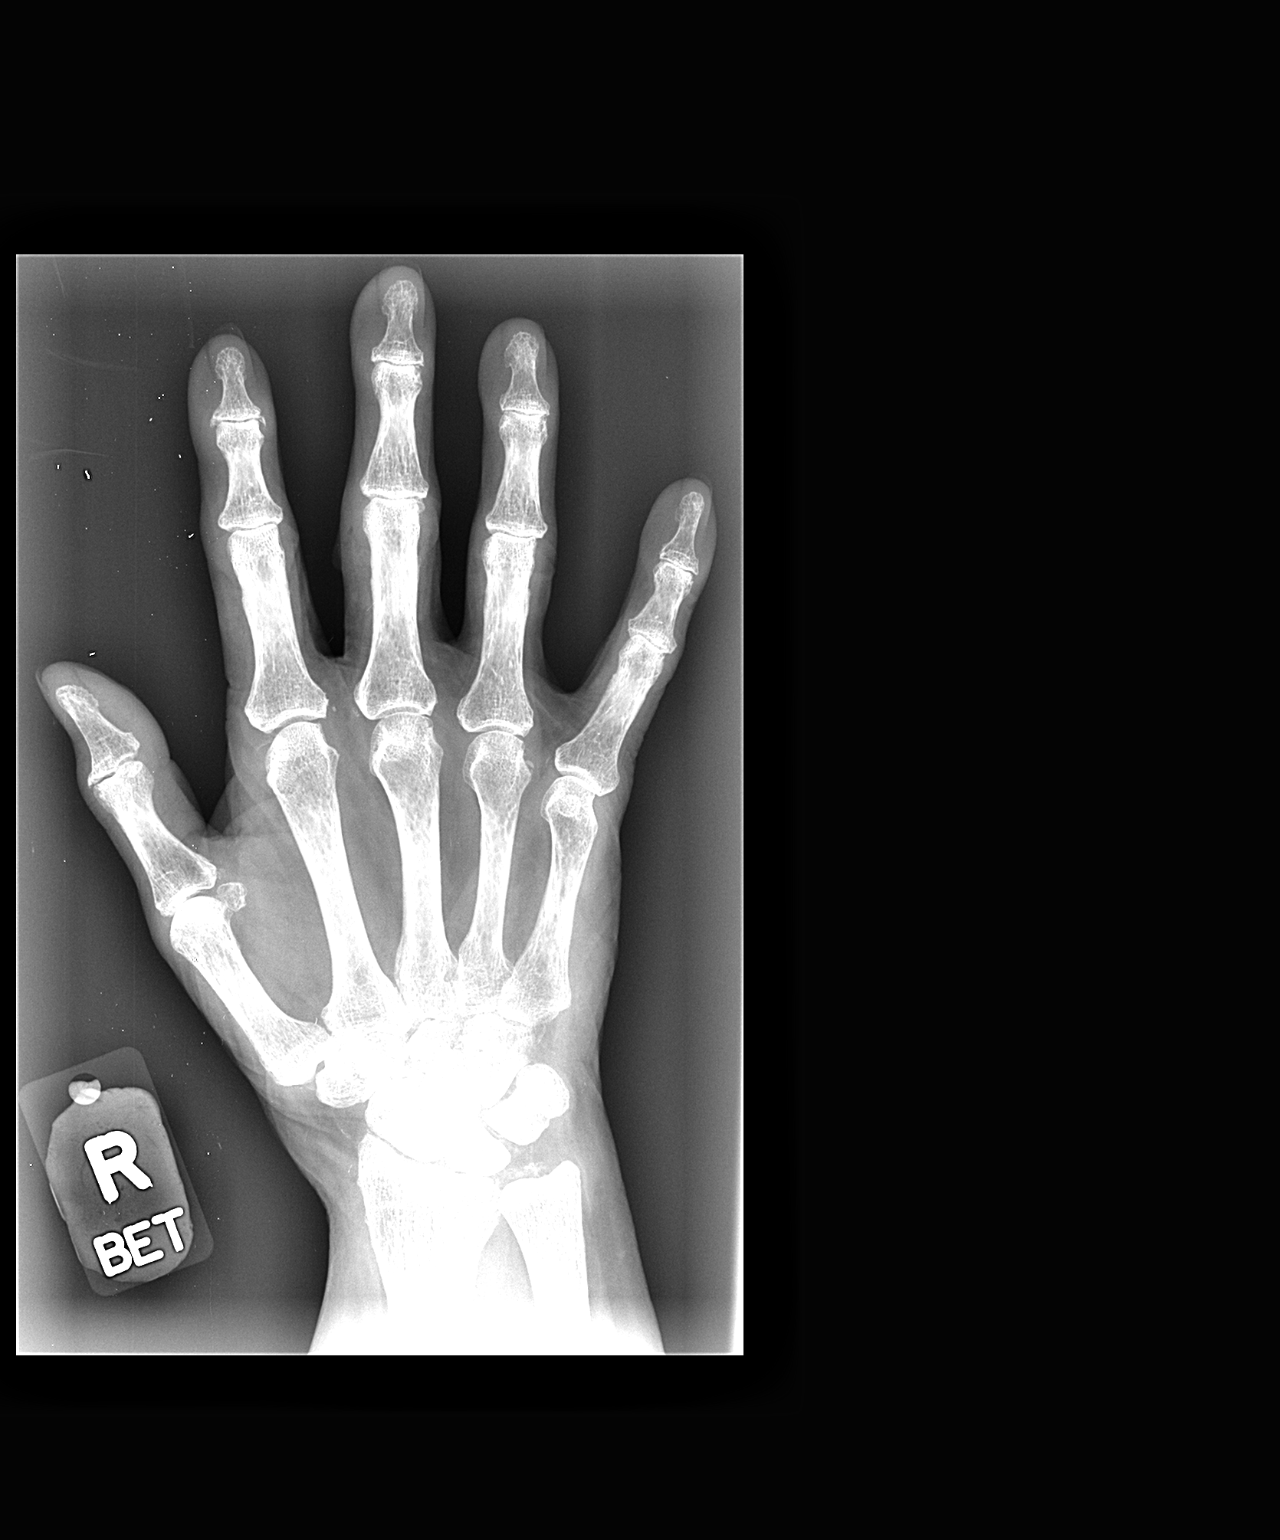

[view not recorded (2 of 3)]
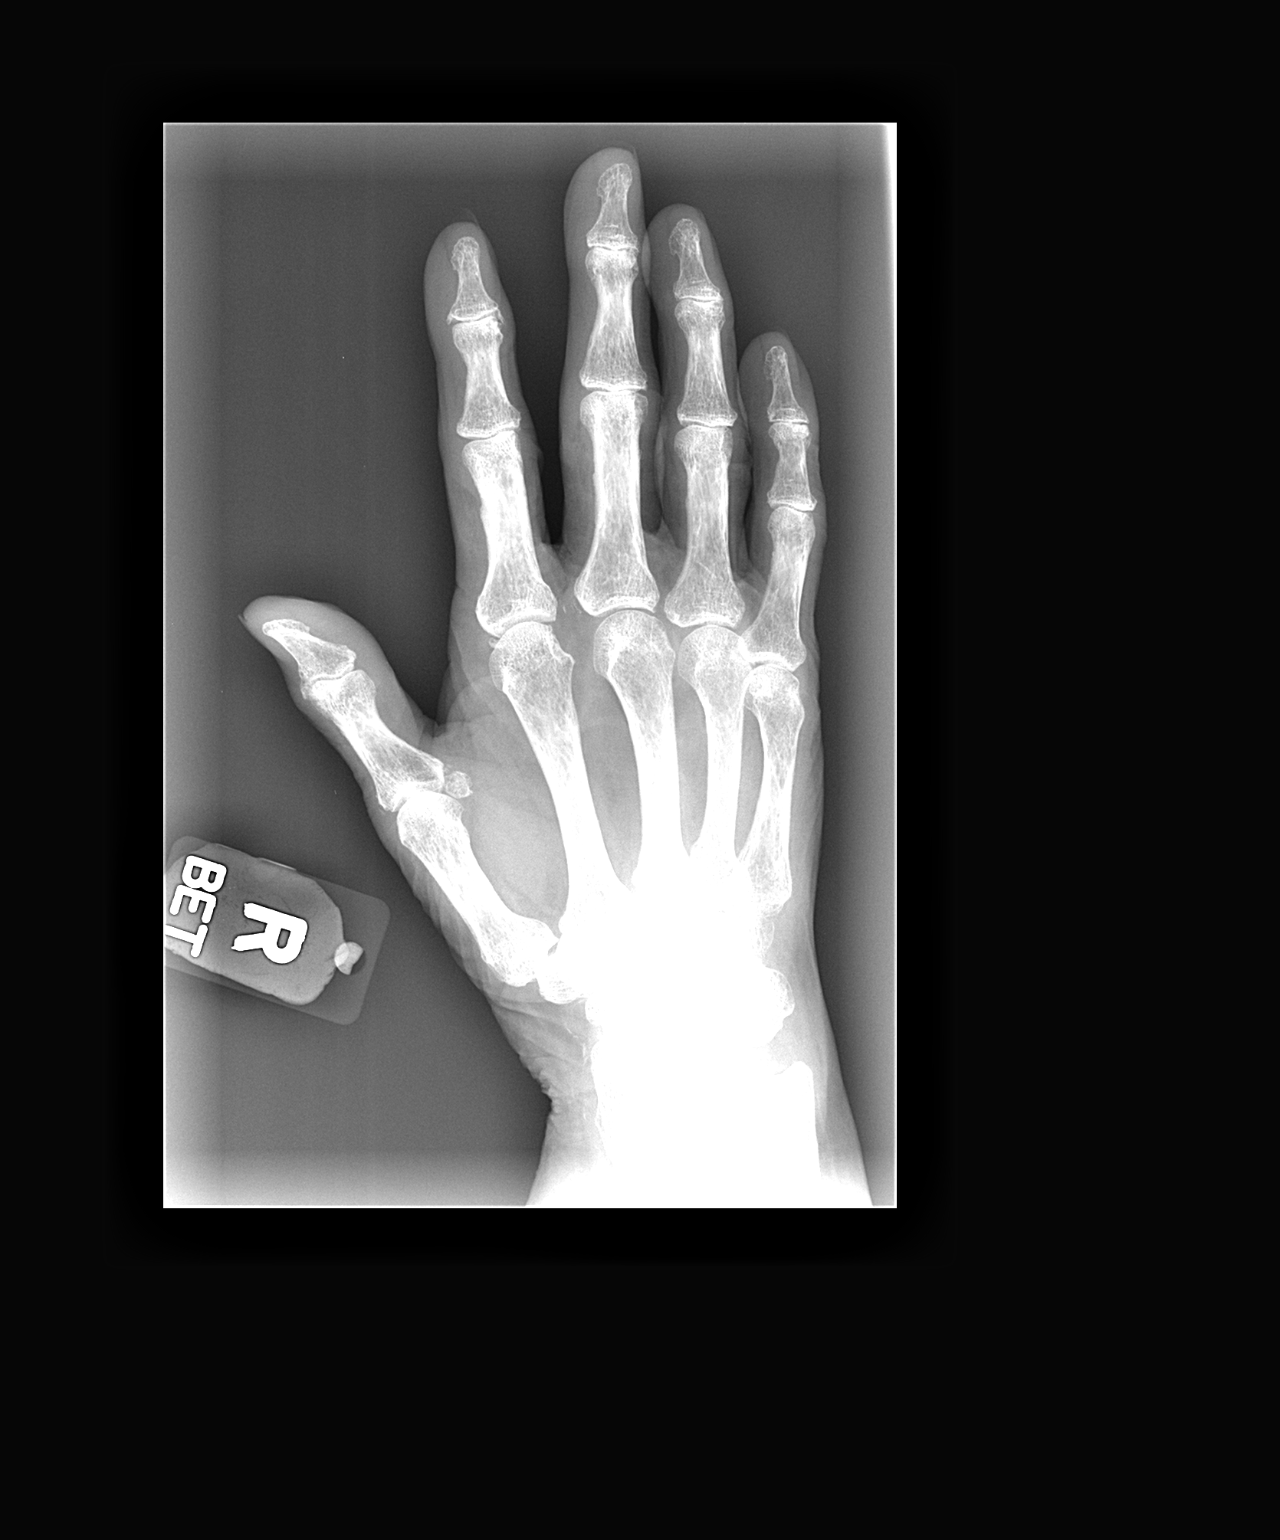

[view not recorded (3 of 3)]
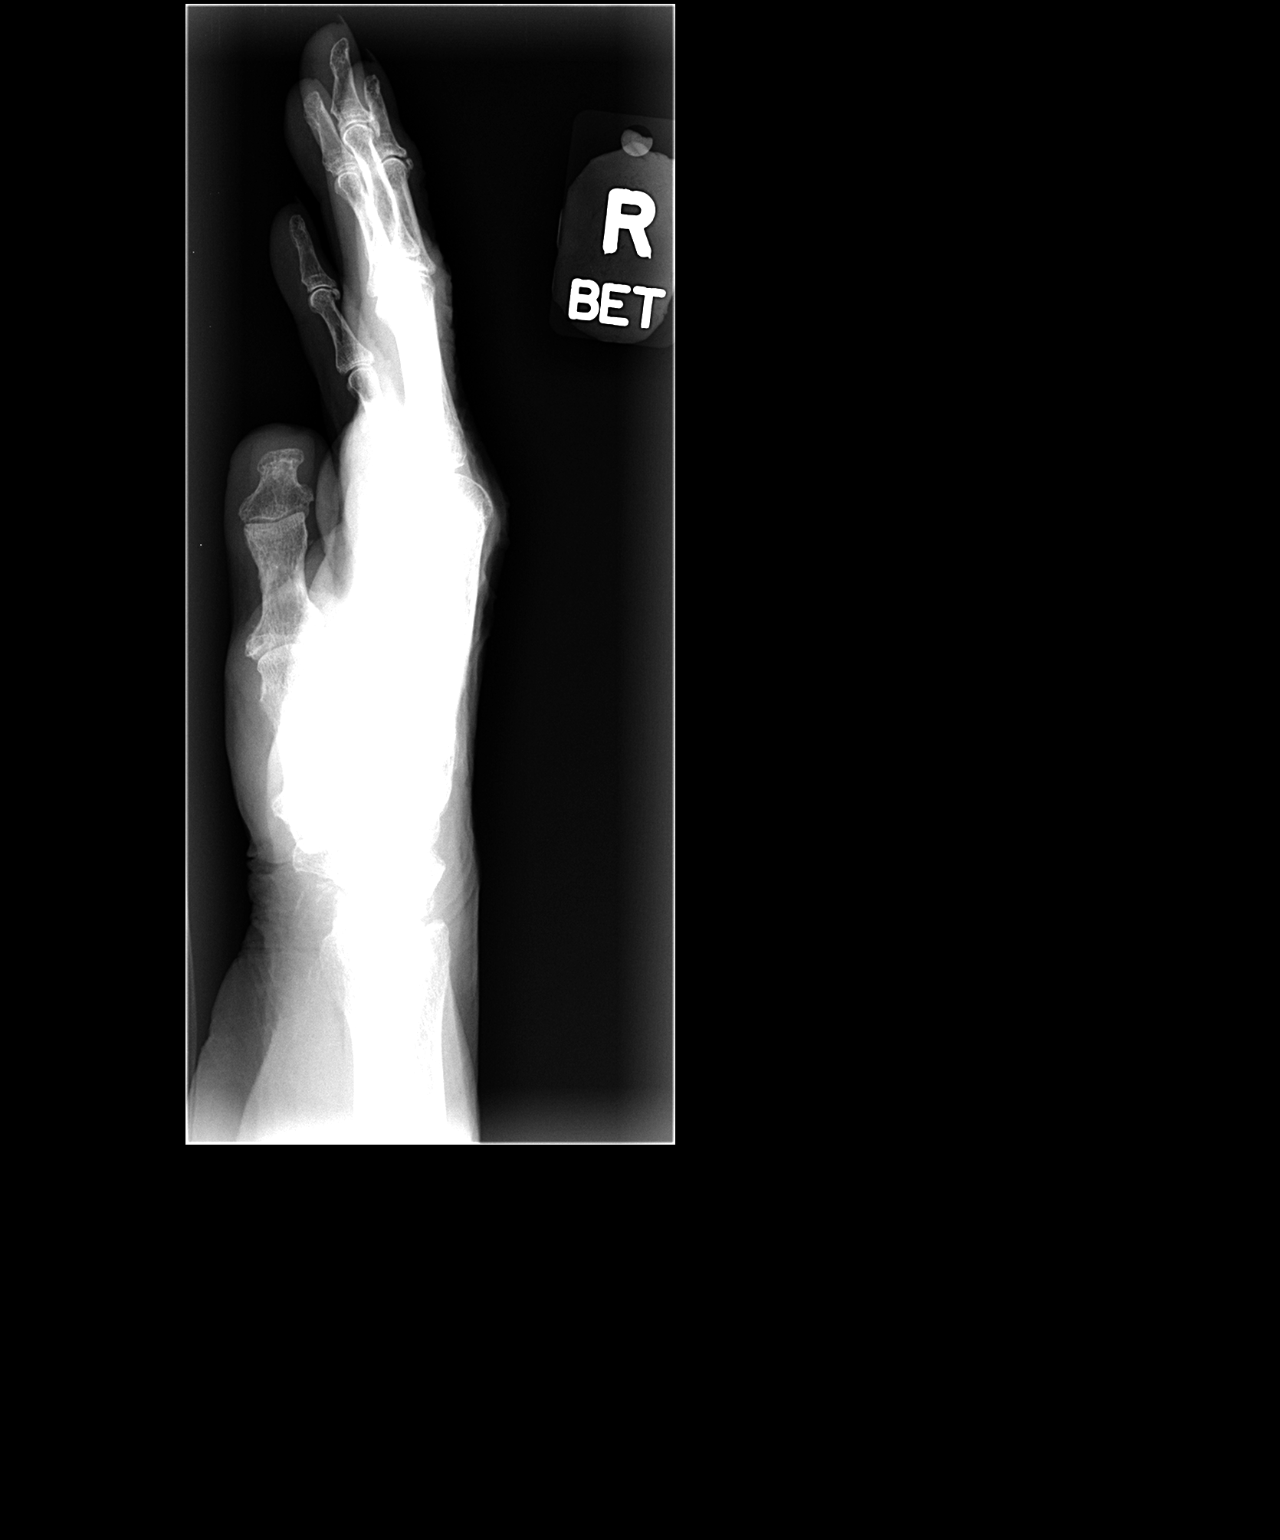

[3 of 3 positions shown; findings below may reference images not displayed]

FINDINGS: Mild diffuse osteopenia.  Chondrocalcinosis in the TFCC.
Carpal rows intact.  Negative for fracture, dislocation, or other
acute bony abnormality.
IMPRESSION: 1.  Negative for fracture or other acute bone injury.
2.  Diffuse osteopenia.
3.  Chondrocalcinosis suggesting CPPD.

## 2012-11-07 ENCOUNTER — Encounter (INDEPENDENT_AMBULATORY_CARE_PROVIDER_SITE_OTHER): Payer: Self-pay | Admitting: Internal Medicine

## 2012-11-07 ENCOUNTER — Ambulatory Visit (INDEPENDENT_AMBULATORY_CARE_PROVIDER_SITE_OTHER): Payer: Medicare Other | Admitting: Internal Medicine

## 2012-11-07 VITALS — BP 134/50 | HR 60 | Temp 97.6°F | Ht 60.0 in | Wt 177.9 lb

## 2012-11-07 DIAGNOSIS — K219 Gastro-esophageal reflux disease without esophagitis: Secondary | ICD-10-CM | POA: Diagnosis not present

## 2012-11-07 NOTE — Progress Notes (Signed)
Subjective:     Patient ID: [name] [name], female   DOB: Aug 26, 1924, 78 y.o.   MRN: [id number]  HPIHere today for f/u.  Doing pretty good. She denies any dysphagia. No acid reflux. She sleeps on a bed with head raised. 9/12/2-12 Swallow study normal by speech pathology.  No abdominal pain.  She usually ha a BM every 2 days. No melena or bright red rectal bleedin  Daughter in room providing hx . Patient residence of [address] in [place]. Review of Systems see hpi  Review of Systems see hpi Current Outpatient Prescriptions  Medication Sig Dispense Refill  . acetaminophen (TYLENOL) 500 MG tablet Take 500 mg by mouth 2 (two) times daily. Or as needed for headache      . amoxicillin (AMOXIL) 250 MG capsule Take 250 mg by mouth at bedtime.      Marland Kitchen aspirin 81 MG tablet Take 81 mg by mouth daily.        Marland Kitchen atropine 1 % ophthalmic solution Apply 1 drop to eye 2 (two) times daily.      . chlorhexidine (PERIDEX) 0.12 % solution Use as directed 15 mLs in the mouth or throat as directed.       . citalopram (CELEXA) 20 MG tablet Take 20 mg by mouth daily.       . Difluprednate (DUREZOL) 0.05 % EMUL Apply 1 drop to eye 2 (two) times daily. RIGHT EYE      . docusate sodium (CVS STOOL SOFTENER) 100 MG capsule Take 100 mg by mouth 2 (two) times daily.        . furosemide (LASIX) 20 MG tablet Take 20 mg by mouth daily.       . hydroxypropyl methylcellulose (ISOPTO TEARS) 2.5 % ophthalmic solution Place 1 drop into both eyes daily.      Marland Kitchen ibandronate (BONIVA) 150 MG tablet Take 150 mg by mouth every 30 (thirty) days. Take in the morning with a full glass of water, on an empty stomach, and do not take anything else by mouth or lie down for the next 30 min. *Administered at MD office       . loratadine (CLARITIN) 10 MG tablet Take 10 mg by mouth daily.        Marland Kitchen losartan (COZAAR) 25 MG tablet Take 25 mg by mouth daily.        . memantine (NAMENDA) 10 MG tablet Take 10 mg by mouth 2 (two) times daily.         . methylcellulose (ARTIFICIAL TEARS) 1 % ophthalmic solution Apply 1 drop to eye as needed.      Marland Kitchen omeprazole (PRILOSEC) 40 MG capsule Take 40 mg by mouth daily.      . potassium chloride (K-DUR,KLOR-CON) 10 MEQ tablet Take 10 mEq by mouth daily.      . sucralfate (CARAFATE) 1 G tablet Take 1 g by mouth 4 (four) times daily. *Crushed and mixed with small amount of liquid as directed by physician*      . Vitamin B Complex-C CAPS Take 1 capsule by mouth daily.       . vitamin E 400 UNIT capsule Take 400 Units by mouth daily.        Marland Kitchen omeprazole (PRILOSEC) 40 MG capsule Take 1 capsule (40 mg total) by mouth daily.  30 capsule  12   Past Medical History  Diagnosis Date  . ASCVD (arteriosclerotic cardiovascular disease) 2003    Single vessel disease-60% D1  . Hypertension   .  Hyperlipidemia   . Diabetes mellitus     No insulin  . Leukopenia     and anemia-hematology evaluation was nodiagnostic  . Degenerative joint disease     right TKA in 4/06  . Upper GI bleed     gastric stress ulcer in 2003  . Retinal artery branch occlusion of right eye     Sudden onset of right eye blindness-embolism suspected  . Nephrolithiasis   . Gastroesophageal reflux disease with hiatal hernia   . Osteoporosis   . Lung nodule     Left lower lobe  . Parkinson's disease   . Dementia   . Cervical spondylarthritis     S/p discectomy  . Alzheimer disease    Past Surgical History  Procedure Date  . Total abdominal hysterectomy   . Cervical discectomy     Dr. Newell Coral  . Cholecystectomy   . Total knee arthroplasty 2006    Right; Dr. Eulah Pont  . Colonoscopy 2009  . Joint replacement   . Vascular surgery   . Cystoscopy w/ ureteral stent placement 09/23/2011    Procedure: CYSTOSCOPY WITH RETROGRADE PYELOGRAM/URETERAL STENT PLACEMENT;  Surgeon: Ky Barban;  Location: [facility];  Service: Urology;  Laterality: Right;   Allergies  Allergen Reactions  . Codeine Other (See Comments)    REACTION: Unknown   . Morphine Nausea And Vomiting        Objective:   Physical Exam Filed Vitals:   11/07/12 1454  Height: 5' (1.524 m)  Weight: 177 lb 14.4 oz (80.695 kg)  Alert and disoriented. Skin warm and dry. Oral mucosa is moist.   . Sclera anicteric, conjunctivae is pink. Thyroid not enlarged. No cervical lymphadenopathy. Lungs clear. Heart regular rate and rhythm.  Abdomen is soft. Bowel sounds are positive. No hepatomegaly. No abdominal masses felt. No tenderness.  No edema to lower extremities.       Assessment:    GERD controlled at this time with Carafate and Prilosec   Plan:    Continue present medications. If any problems call our office.

## 2012-11-07 NOTE — Patient Instructions (Addendum)
Continue present medications. OV in one year.  

## 2012-11-08 DIAGNOSIS — I739 Peripheral vascular disease, unspecified: Secondary | ICD-10-CM | POA: Diagnosis not present

## 2012-11-29 DIAGNOSIS — E109 Type 1 diabetes mellitus without complications: Secondary | ICD-10-CM | POA: Diagnosis not present

## 2012-11-29 DIAGNOSIS — H44529 Atrophy of globe, unspecified eye: Secondary | ICD-10-CM | POA: Diagnosis not present

## 2012-11-29 DIAGNOSIS — H348192 Central retinal vein occlusion, unspecified eye, stable: Secondary | ICD-10-CM | POA: Diagnosis not present

## 2012-11-30 DIAGNOSIS — R259 Unspecified abnormal involuntary movements: Secondary | ICD-10-CM | POA: Diagnosis not present

## 2012-11-30 DIAGNOSIS — F0280 Dementia in other diseases classified elsewhere without behavioral disturbance: Secondary | ICD-10-CM | POA: Diagnosis not present

## 2012-12-04 ENCOUNTER — Ambulatory Visit (HOSPITAL_COMMUNITY)
Admission: RE | Admit: 2012-12-04 | Discharge: 2012-12-04 | Disposition: A | Discharge status: Home / Self Care | Payer: Medicare Other | Source: Ambulatory Visit | Attending: Urology | Admitting: Urology

## 2012-12-04 ENCOUNTER — Other Ambulatory Visit: Payer: Self-pay | Admitting: Urology

## 2012-12-04 DIAGNOSIS — N200 Calculus of kidney: Secondary | ICD-10-CM | POA: Insufficient documentation

## 2012-12-04 DIAGNOSIS — R109 Unspecified abdominal pain: Secondary | ICD-10-CM | POA: Diagnosis not present

## 2012-12-04 DIAGNOSIS — R935 Abnormal findings on diagnostic imaging of other abdominal regions, including retroperitoneum: Secondary | ICD-10-CM | POA: Diagnosis not present

## 2012-12-14 ENCOUNTER — Ambulatory Visit (INDEPENDENT_AMBULATORY_CARE_PROVIDER_SITE_OTHER): Payer: Medicare Other | Admitting: Urology

## 2012-12-14 DIAGNOSIS — N390 Urinary tract infection, site not specified: Secondary | ICD-10-CM | POA: Diagnosis not present

## 2012-12-14 DIAGNOSIS — Z87440 Personal history of urinary (tract) infections: Secondary | ICD-10-CM

## 2012-12-14 DIAGNOSIS — N200 Calculus of kidney: Secondary | ICD-10-CM

## 2012-12-21 DIAGNOSIS — G252 Other specified forms of tremor: Secondary | ICD-10-CM | POA: Diagnosis not present

## 2012-12-21 DIAGNOSIS — R269 Unspecified abnormalities of gait and mobility: Secondary | ICD-10-CM | POA: Diagnosis not present

## 2012-12-21 DIAGNOSIS — G250 Essential tremor: Secondary | ICD-10-CM | POA: Diagnosis not present

## 2012-12-21 DIAGNOSIS — F068 Other specified mental disorders due to known physiological condition: Secondary | ICD-10-CM | POA: Diagnosis not present

## 2013-01-01 DIAGNOSIS — G309 Alzheimer's disease, unspecified: Secondary | ICD-10-CM | POA: Diagnosis not present

## 2013-01-01 DIAGNOSIS — G20 Parkinson's disease: Secondary | ICD-10-CM | POA: Diagnosis not present

## 2013-01-01 DIAGNOSIS — R1311 Dysphagia, oral phase: Secondary | ICD-10-CM | POA: Diagnosis not present

## 2013-01-01 DIAGNOSIS — I10 Essential (primary) hypertension: Secondary | ICD-10-CM | POA: Diagnosis not present

## 2013-01-01 DIAGNOSIS — F0280 Dementia in other diseases classified elsewhere without behavioral disturbance: Secondary | ICD-10-CM | POA: Diagnosis not present

## 2013-01-01 DIAGNOSIS — E119 Type 2 diabetes mellitus without complications: Secondary | ICD-10-CM | POA: Diagnosis not present

## 2013-01-04 DIAGNOSIS — E119 Type 2 diabetes mellitus without complications: Secondary | ICD-10-CM | POA: Diagnosis not present

## 2013-01-04 DIAGNOSIS — F0280 Dementia in other diseases classified elsewhere without behavioral disturbance: Secondary | ICD-10-CM | POA: Diagnosis not present

## 2013-01-04 DIAGNOSIS — I10 Essential (primary) hypertension: Secondary | ICD-10-CM | POA: Diagnosis not present

## 2013-01-04 DIAGNOSIS — G20 Parkinson's disease: Secondary | ICD-10-CM | POA: Diagnosis not present

## 2013-01-04 DIAGNOSIS — R1311 Dysphagia, oral phase: Secondary | ICD-10-CM | POA: Diagnosis not present

## 2013-01-05 DIAGNOSIS — F0280 Dementia in other diseases classified elsewhere without behavioral disturbance: Secondary | ICD-10-CM | POA: Diagnosis not present

## 2013-01-05 DIAGNOSIS — R1311 Dysphagia, oral phase: Secondary | ICD-10-CM | POA: Diagnosis not present

## 2013-01-05 DIAGNOSIS — E119 Type 2 diabetes mellitus without complications: Secondary | ICD-10-CM | POA: Diagnosis not present

## 2013-01-05 DIAGNOSIS — I10 Essential (primary) hypertension: Secondary | ICD-10-CM | POA: Diagnosis not present

## 2013-01-05 DIAGNOSIS — G20 Parkinson's disease: Secondary | ICD-10-CM | POA: Diagnosis not present

## 2013-01-05 DIAGNOSIS — G309 Alzheimer's disease, unspecified: Secondary | ICD-10-CM | POA: Diagnosis not present

## 2013-01-07 DIAGNOSIS — G20 Parkinson's disease: Secondary | ICD-10-CM | POA: Diagnosis not present

## 2013-01-07 DIAGNOSIS — E119 Type 2 diabetes mellitus without complications: Secondary | ICD-10-CM | POA: Diagnosis not present

## 2013-01-07 DIAGNOSIS — F0280 Dementia in other diseases classified elsewhere without behavioral disturbance: Secondary | ICD-10-CM | POA: Diagnosis not present

## 2013-01-07 DIAGNOSIS — R1311 Dysphagia, oral phase: Secondary | ICD-10-CM | POA: Diagnosis not present

## 2013-01-07 DIAGNOSIS — I10 Essential (primary) hypertension: Secondary | ICD-10-CM | POA: Diagnosis not present

## 2013-01-07 DIAGNOSIS — G309 Alzheimer's disease, unspecified: Secondary | ICD-10-CM | POA: Diagnosis not present

## 2013-01-09 DIAGNOSIS — G20 Parkinson's disease: Secondary | ICD-10-CM | POA: Diagnosis not present

## 2013-01-09 DIAGNOSIS — G309 Alzheimer's disease, unspecified: Secondary | ICD-10-CM | POA: Diagnosis not present

## 2013-01-09 DIAGNOSIS — E119 Type 2 diabetes mellitus without complications: Secondary | ICD-10-CM | POA: Diagnosis not present

## 2013-01-09 DIAGNOSIS — R1311 Dysphagia, oral phase: Secondary | ICD-10-CM | POA: Diagnosis not present

## 2013-01-09 DIAGNOSIS — I10 Essential (primary) hypertension: Secondary | ICD-10-CM | POA: Diagnosis not present

## 2013-01-09 DIAGNOSIS — F0280 Dementia in other diseases classified elsewhere without behavioral disturbance: Secondary | ICD-10-CM | POA: Diagnosis not present

## 2013-01-10 DIAGNOSIS — E119 Type 2 diabetes mellitus without complications: Secondary | ICD-10-CM | POA: Diagnosis not present

## 2013-01-10 DIAGNOSIS — I10 Essential (primary) hypertension: Secondary | ICD-10-CM | POA: Diagnosis not present

## 2013-01-10 DIAGNOSIS — G309 Alzheimer's disease, unspecified: Secondary | ICD-10-CM | POA: Diagnosis not present

## 2013-01-10 DIAGNOSIS — G20 Parkinson's disease: Secondary | ICD-10-CM | POA: Diagnosis not present

## 2013-01-10 DIAGNOSIS — R1311 Dysphagia, oral phase: Secondary | ICD-10-CM | POA: Diagnosis not present

## 2013-01-10 DIAGNOSIS — F0280 Dementia in other diseases classified elsewhere without behavioral disturbance: Secondary | ICD-10-CM | POA: Diagnosis not present

## 2013-01-11 DIAGNOSIS — G250 Essential tremor: Secondary | ICD-10-CM | POA: Diagnosis not present

## 2013-01-11 DIAGNOSIS — G252 Other specified forms of tremor: Secondary | ICD-10-CM | POA: Diagnosis not present

## 2013-01-11 DIAGNOSIS — R269 Unspecified abnormalities of gait and mobility: Secondary | ICD-10-CM | POA: Diagnosis not present

## 2013-01-11 DIAGNOSIS — F068 Other specified mental disorders due to known physiological condition: Secondary | ICD-10-CM | POA: Diagnosis not present

## 2013-01-14 DIAGNOSIS — R1311 Dysphagia, oral phase: Secondary | ICD-10-CM | POA: Diagnosis not present

## 2013-01-14 DIAGNOSIS — I10 Essential (primary) hypertension: Secondary | ICD-10-CM | POA: Diagnosis not present

## 2013-01-14 DIAGNOSIS — G20 Parkinson's disease: Secondary | ICD-10-CM | POA: Diagnosis not present

## 2013-01-14 DIAGNOSIS — F0280 Dementia in other diseases classified elsewhere without behavioral disturbance: Secondary | ICD-10-CM | POA: Diagnosis not present

## 2013-01-14 DIAGNOSIS — E119 Type 2 diabetes mellitus without complications: Secondary | ICD-10-CM | POA: Diagnosis not present

## 2013-01-15 DIAGNOSIS — E119 Type 2 diabetes mellitus without complications: Secondary | ICD-10-CM | POA: Diagnosis not present

## 2013-01-15 DIAGNOSIS — G20 Parkinson's disease: Secondary | ICD-10-CM | POA: Diagnosis not present

## 2013-01-15 DIAGNOSIS — I10 Essential (primary) hypertension: Secondary | ICD-10-CM | POA: Diagnosis not present

## 2013-01-15 DIAGNOSIS — R1311 Dysphagia, oral phase: Secondary | ICD-10-CM | POA: Diagnosis not present

## 2013-01-15 DIAGNOSIS — F0280 Dementia in other diseases classified elsewhere without behavioral disturbance: Secondary | ICD-10-CM | POA: Diagnosis not present

## 2013-01-17 DIAGNOSIS — F0280 Dementia in other diseases classified elsewhere without behavioral disturbance: Secondary | ICD-10-CM | POA: Diagnosis not present

## 2013-01-17 DIAGNOSIS — R1311 Dysphagia, oral phase: Secondary | ICD-10-CM | POA: Diagnosis not present

## 2013-01-17 DIAGNOSIS — I10 Essential (primary) hypertension: Secondary | ICD-10-CM | POA: Diagnosis not present

## 2013-01-17 DIAGNOSIS — E119 Type 2 diabetes mellitus without complications: Secondary | ICD-10-CM | POA: Diagnosis not present

## 2013-01-17 DIAGNOSIS — G20 Parkinson's disease: Secondary | ICD-10-CM | POA: Diagnosis not present

## 2013-01-18 DIAGNOSIS — N390 Urinary tract infection, site not specified: Secondary | ICD-10-CM | POA: Diagnosis not present

## 2013-01-21 DIAGNOSIS — F0280 Dementia in other diseases classified elsewhere without behavioral disturbance: Secondary | ICD-10-CM | POA: Diagnosis not present

## 2013-01-21 DIAGNOSIS — E119 Type 2 diabetes mellitus without complications: Secondary | ICD-10-CM | POA: Diagnosis not present

## 2013-01-21 DIAGNOSIS — R1311 Dysphagia, oral phase: Secondary | ICD-10-CM | POA: Diagnosis not present

## 2013-01-21 DIAGNOSIS — I10 Essential (primary) hypertension: Secondary | ICD-10-CM | POA: Diagnosis not present

## 2013-01-21 DIAGNOSIS — G20 Parkinson's disease: Secondary | ICD-10-CM | POA: Diagnosis not present

## 2013-01-23 DIAGNOSIS — G20 Parkinson's disease: Secondary | ICD-10-CM | POA: Diagnosis not present

## 2013-01-23 DIAGNOSIS — R1311 Dysphagia, oral phase: Secondary | ICD-10-CM | POA: Diagnosis not present

## 2013-01-23 DIAGNOSIS — I10 Essential (primary) hypertension: Secondary | ICD-10-CM | POA: Diagnosis not present

## 2013-01-23 DIAGNOSIS — F0280 Dementia in other diseases classified elsewhere without behavioral disturbance: Secondary | ICD-10-CM | POA: Diagnosis not present

## 2013-01-23 DIAGNOSIS — E119 Type 2 diabetes mellitus without complications: Secondary | ICD-10-CM | POA: Diagnosis not present

## 2013-01-24 DIAGNOSIS — G20 Parkinson's disease: Secondary | ICD-10-CM | POA: Diagnosis not present

## 2013-01-24 DIAGNOSIS — R1311 Dysphagia, oral phase: Secondary | ICD-10-CM | POA: Diagnosis not present

## 2013-01-24 DIAGNOSIS — F0280 Dementia in other diseases classified elsewhere without behavioral disturbance: Secondary | ICD-10-CM | POA: Diagnosis not present

## 2013-01-24 DIAGNOSIS — I10 Essential (primary) hypertension: Secondary | ICD-10-CM | POA: Diagnosis not present

## 2013-01-26 DIAGNOSIS — G309 Alzheimer's disease, unspecified: Secondary | ICD-10-CM | POA: Diagnosis not present

## 2013-01-26 DIAGNOSIS — E119 Type 2 diabetes mellitus without complications: Secondary | ICD-10-CM | POA: Diagnosis not present

## 2013-01-26 DIAGNOSIS — F0280 Dementia in other diseases classified elsewhere without behavioral disturbance: Secondary | ICD-10-CM | POA: Diagnosis not present

## 2013-01-26 DIAGNOSIS — G20 Parkinson's disease: Secondary | ICD-10-CM | POA: Diagnosis not present

## 2013-01-26 DIAGNOSIS — R1311 Dysphagia, oral phase: Secondary | ICD-10-CM | POA: Diagnosis not present

## 2013-01-26 DIAGNOSIS — I10 Essential (primary) hypertension: Secondary | ICD-10-CM | POA: Diagnosis not present

## 2013-01-29 DIAGNOSIS — G309 Alzheimer's disease, unspecified: Secondary | ICD-10-CM | POA: Diagnosis not present

## 2013-01-29 DIAGNOSIS — I10 Essential (primary) hypertension: Secondary | ICD-10-CM | POA: Diagnosis not present

## 2013-01-29 DIAGNOSIS — F0280 Dementia in other diseases classified elsewhere without behavioral disturbance: Secondary | ICD-10-CM | POA: Diagnosis not present

## 2013-01-29 DIAGNOSIS — E119 Type 2 diabetes mellitus without complications: Secondary | ICD-10-CM | POA: Diagnosis not present

## 2013-01-29 DIAGNOSIS — G20 Parkinson's disease: Secondary | ICD-10-CM | POA: Diagnosis not present

## 2013-01-29 DIAGNOSIS — R1311 Dysphagia, oral phase: Secondary | ICD-10-CM | POA: Diagnosis not present

## 2013-01-31 DIAGNOSIS — F0280 Dementia in other diseases classified elsewhere without behavioral disturbance: Secondary | ICD-10-CM | POA: Diagnosis not present

## 2013-01-31 DIAGNOSIS — I10 Essential (primary) hypertension: Secondary | ICD-10-CM | POA: Diagnosis not present

## 2013-01-31 DIAGNOSIS — R1311 Dysphagia, oral phase: Secondary | ICD-10-CM | POA: Diagnosis not present

## 2013-01-31 DIAGNOSIS — G20 Parkinson's disease: Secondary | ICD-10-CM | POA: Diagnosis not present

## 2013-01-31 DIAGNOSIS — E119 Type 2 diabetes mellitus without complications: Secondary | ICD-10-CM | POA: Diagnosis not present

## 2013-02-01 DIAGNOSIS — E119 Type 2 diabetes mellitus without complications: Secondary | ICD-10-CM | POA: Diagnosis not present

## 2013-02-01 DIAGNOSIS — I10 Essential (primary) hypertension: Secondary | ICD-10-CM | POA: Diagnosis not present

## 2013-02-01 DIAGNOSIS — R1311 Dysphagia, oral phase: Secondary | ICD-10-CM | POA: Diagnosis not present

## 2013-02-01 DIAGNOSIS — G20 Parkinson's disease: Secondary | ICD-10-CM | POA: Diagnosis not present

## 2013-02-01 DIAGNOSIS — F0280 Dementia in other diseases classified elsewhere without behavioral disturbance: Secondary | ICD-10-CM | POA: Diagnosis not present

## 2013-02-07 DIAGNOSIS — I739 Peripheral vascular disease, unspecified: Secondary | ICD-10-CM | POA: Diagnosis not present

## 2013-02-11 ENCOUNTER — Encounter: Payer: Self-pay | Admitting: *Deleted

## 2013-02-11 ENCOUNTER — Other Ambulatory Visit: Payer: Self-pay | Admitting: *Deleted

## 2013-02-11 MED ORDER — VITAMIN B COMPLEX-C PO CAPS: 1.0000 | ORAL_CAPSULE | Freq: Every day | ORAL | Status: DC

## 2013-02-12 ENCOUNTER — Other Ambulatory Visit: Payer: Self-pay | Admitting: *Deleted

## 2013-02-12 MED ORDER — FUROSEMIDE 20 MG PO TABS: 20.0000 mg | ORAL_TABLET | Freq: Every day | ORAL | Status: DC

## 2013-02-21 ENCOUNTER — Ambulatory Visit (INDEPENDENT_AMBULATORY_CARE_PROVIDER_SITE_OTHER): Payer: Medicare Other | Admitting: Family Medicine

## 2013-02-21 ENCOUNTER — Encounter: Payer: Self-pay | Admitting: Family Medicine

## 2013-02-21 VITALS — BP 122/88 | Temp 99.1°F | Wt 275.0 lb

## 2013-02-21 DIAGNOSIS — E119 Type 2 diabetes mellitus without complications: Secondary | ICD-10-CM | POA: Diagnosis not present

## 2013-02-21 DIAGNOSIS — I10 Essential (primary) hypertension: Secondary | ICD-10-CM | POA: Diagnosis not present

## 2013-02-21 DIAGNOSIS — F0390 Unspecified dementia without behavioral disturbance: Secondary | ICD-10-CM

## 2013-02-21 DIAGNOSIS — D649 Anemia, unspecified: Secondary | ICD-10-CM | POA: Diagnosis not present

## 2013-02-21 LAB — BASIC METABOLIC PANEL
Potassium: 4.6 mEq/L (ref 3.5–5.3)
Sodium: 139 mEq/L (ref 135–145)

## 2013-02-21 NOTE — Progress Notes (Signed)
  Subjective:    Patient ID: [name] [name], female    DOB: [birth year], 79 y.o.   MRN: [id number]  Shortness of Breath This is a new problem. The current episode started more than 1 month ago. The symptoms are aggravated by exercise. She has tried nothing for the symptoms.  gives out of breath with walking. No cyanosis. No verbal complaints but has dementia. occassional cough. No fevers. No regurgitation. Patient has a lot of breath when she moves around she walks with a walker. She does not seem to be in any discomfort there is no cyanosis no swelling in the legs no PND no orthopnea. The patient is unable to give a history the daughter gives the history the patient has severe dementia yeah. She lives in a rest home. This patient's history of diabetes hypertension dementia essential tremor   Review of Systems  Respiratory: Positive for shortness of breath.   there is been no vomiting diarrhea rectal bleeding no fevers or chills no cough     Objective:   Physical Exam Vital signs noted neck no masses lungs are clear heart is regular pulse normal blood pressure good on recheck abdomen is soft extremities no edema skin warm dry mild tremor and       Assessment & Plan:  Diabetes.DIAG HYPERTENSION - Plan: Basic metabolic panel  Type II diabetes mellitus - Plan: Hemoglobin A1c  Senile dementia  Anemia - Plan: CBC with Differential  25 minutes was spent with the family talked about this I do not feel advanced testing is necessary. We'll monitor closely in followup again in a few months if progressive symptoms or other problems followup sooner [id number]

## 2013-02-21 NOTE — Patient Instructions (Addendum)
Keep meds as is  Do labs 1 week before follow up

## 2013-02-22 LAB — CBC WITH DIFFERENTIAL/PLATELET
Basophils Absolute: 0.1 10*3/uL (ref 0.0–0.1)
Basophils Relative: 1 % (ref 0–1)
Hemoglobin: 11.8 g/dL — ABNORMAL LOW (ref 12.0–15.0)
MCHC: 33.3 g/dL (ref 30.0–36.0)
Neutro Abs: 2.6 10*3/uL (ref 1.7–7.7)
Neutrophils Relative %: 49 % (ref 43–77)
RDW: 13.9 % (ref 11.5–15.5)

## 2013-03-28 ENCOUNTER — Other Ambulatory Visit: Payer: Self-pay | Admitting: Family Medicine

## 2013-03-29 ENCOUNTER — Other Ambulatory Visit: Payer: Self-pay | Admitting: Family Medicine

## 2013-04-04 ENCOUNTER — Other Ambulatory Visit: Payer: Self-pay | Admitting: Family Medicine

## 2013-04-10 ENCOUNTER — Other Ambulatory Visit: Payer: Self-pay | Admitting: Family Medicine

## 2013-04-12 ENCOUNTER — Other Ambulatory Visit: Payer: Self-pay | Admitting: Family Medicine

## 2013-04-25 ENCOUNTER — Other Ambulatory Visit: Payer: Self-pay | Admitting: Family Medicine

## 2013-05-01 ENCOUNTER — Telehealth: Payer: Self-pay | Admitting: Family Medicine

## 2013-05-01 NOTE — Telephone Encounter (Signed)
Patient sits around a lot and not very active. Hollers out and screams when they try to get her to walk so they thought that physical therapy would make her get up and move more. Daughter states that [employer] sent a request for this yest and we sent it back as denied. States patient has office visit with you July 10. If you are calling daughter back tonigfht she said to call her on her cell at [phone].

## 2013-05-01 NOTE — Telephone Encounter (Signed)
Please discuss issue and forward any pertanent concern

## 2013-05-01 NOTE — Telephone Encounter (Signed)
Daughter [name] [name]) wants to speak with Dr. Lorin Picket regarding Physical Therapy for [name] at [facility].  Thanks.

## 2013-05-03 ENCOUNTER — Telehealth: Payer: Self-pay | Admitting: Family Medicine

## 2013-05-03 DIAGNOSIS — IMO0002 Reserved for concepts with insufficient information to code with codable children: Secondary | ICD-10-CM | POA: Diagnosis not present

## 2013-05-03 DIAGNOSIS — F0280 Dementia in other diseases classified elsewhere without behavioral disturbance: Secondary | ICD-10-CM | POA: Diagnosis not present

## 2013-05-03 DIAGNOSIS — G20 Parkinson's disease: Secondary | ICD-10-CM | POA: Diagnosis not present

## 2013-05-03 DIAGNOSIS — M6281 Muscle weakness (generalized): Secondary | ICD-10-CM | POA: Diagnosis not present

## 2013-05-03 DIAGNOSIS — I10 Essential (primary) hypertension: Secondary | ICD-10-CM | POA: Diagnosis not present

## 2013-05-03 DIAGNOSIS — M810 Age-related osteoporosis without current pathological fracture: Secondary | ICD-10-CM | POA: Diagnosis not present

## 2013-05-03 DIAGNOSIS — G309 Alzheimer's disease, unspecified: Secondary | ICD-10-CM | POA: Diagnosis not present

## 2013-05-03 DIAGNOSIS — E119 Type 2 diabetes mellitus without complications: Secondary | ICD-10-CM | POA: Diagnosis not present

## 2013-05-03 DIAGNOSIS — R269 Unspecified abnormalities of gait and mobility: Secondary | ICD-10-CM | POA: Diagnosis not present

## 2013-05-03 DIAGNOSIS — R279 Unspecified lack of coordination: Secondary | ICD-10-CM | POA: Diagnosis not present

## 2013-05-03 DIAGNOSIS — R1311 Dysphagia, oral phase: Secondary | ICD-10-CM | POA: Diagnosis not present

## 2013-05-03 DIAGNOSIS — M169 Osteoarthritis of hip, unspecified: Secondary | ICD-10-CM | POA: Diagnosis not present

## 2013-05-03 DIAGNOSIS — I2510 Atherosclerotic heart disease of native coronary artery without angina pectoris: Secondary | ICD-10-CM | POA: Diagnosis not present

## 2013-05-03 NOTE — Telephone Encounter (Signed)
Patient had Physical therapy done and Amy(physical therapist) decided she was going to do PT for patient 2x times for 4 weeks.

## 2013-05-03 NOTE — Telephone Encounter (Signed)
FYI from PT

## 2013-05-03 NOTE — Telephone Encounter (Signed)
i spoke with daughter, Vaughan Browner th ordered per her request. They will follow up in July thanks.

## 2013-05-05 NOTE — Telephone Encounter (Signed)
Noted, thanks!

## 2013-05-06 DIAGNOSIS — M169 Osteoarthritis of hip, unspecified: Secondary | ICD-10-CM | POA: Diagnosis not present

## 2013-05-06 DIAGNOSIS — R1311 Dysphagia, oral phase: Secondary | ICD-10-CM | POA: Diagnosis not present

## 2013-05-06 DIAGNOSIS — G20 Parkinson's disease: Secondary | ICD-10-CM | POA: Diagnosis not present

## 2013-05-06 DIAGNOSIS — IMO0002 Reserved for concepts with insufficient information to code with codable children: Secondary | ICD-10-CM | POA: Diagnosis not present

## 2013-05-06 DIAGNOSIS — E119 Type 2 diabetes mellitus without complications: Secondary | ICD-10-CM | POA: Diagnosis not present

## 2013-05-06 DIAGNOSIS — G309 Alzheimer's disease, unspecified: Secondary | ICD-10-CM | POA: Diagnosis not present

## 2013-05-07 DIAGNOSIS — E119 Type 2 diabetes mellitus without complications: Secondary | ICD-10-CM | POA: Diagnosis not present

## 2013-05-07 DIAGNOSIS — R1311 Dysphagia, oral phase: Secondary | ICD-10-CM | POA: Diagnosis not present

## 2013-05-07 DIAGNOSIS — M169 Osteoarthritis of hip, unspecified: Secondary | ICD-10-CM | POA: Diagnosis not present

## 2013-05-07 DIAGNOSIS — F0280 Dementia in other diseases classified elsewhere without behavioral disturbance: Secondary | ICD-10-CM | POA: Diagnosis not present

## 2013-05-07 DIAGNOSIS — IMO0002 Reserved for concepts with insufficient information to code with codable children: Secondary | ICD-10-CM | POA: Diagnosis not present

## 2013-05-07 DIAGNOSIS — G20 Parkinson's disease: Secondary | ICD-10-CM | POA: Diagnosis not present

## 2013-05-08 DIAGNOSIS — M169 Osteoarthritis of hip, unspecified: Secondary | ICD-10-CM | POA: Diagnosis not present

## 2013-05-08 DIAGNOSIS — IMO0002 Reserved for concepts with insufficient information to code with codable children: Secondary | ICD-10-CM | POA: Diagnosis not present

## 2013-05-08 DIAGNOSIS — R1311 Dysphagia, oral phase: Secondary | ICD-10-CM | POA: Diagnosis not present

## 2013-05-08 DIAGNOSIS — E119 Type 2 diabetes mellitus without complications: Secondary | ICD-10-CM | POA: Diagnosis not present

## 2013-05-08 DIAGNOSIS — F0280 Dementia in other diseases classified elsewhere without behavioral disturbance: Secondary | ICD-10-CM | POA: Diagnosis not present

## 2013-05-08 DIAGNOSIS — G20 Parkinson's disease: Secondary | ICD-10-CM | POA: Diagnosis not present

## 2013-05-09 DIAGNOSIS — G309 Alzheimer's disease, unspecified: Secondary | ICD-10-CM | POA: Diagnosis not present

## 2013-05-09 DIAGNOSIS — IMO0002 Reserved for concepts with insufficient information to code with codable children: Secondary | ICD-10-CM | POA: Diagnosis not present

## 2013-05-09 DIAGNOSIS — F0280 Dementia in other diseases classified elsewhere without behavioral disturbance: Secondary | ICD-10-CM | POA: Diagnosis not present

## 2013-05-09 DIAGNOSIS — G20 Parkinson's disease: Secondary | ICD-10-CM | POA: Diagnosis not present

## 2013-05-09 DIAGNOSIS — M169 Osteoarthritis of hip, unspecified: Secondary | ICD-10-CM | POA: Diagnosis not present

## 2013-05-09 DIAGNOSIS — E119 Type 2 diabetes mellitus without complications: Secondary | ICD-10-CM | POA: Diagnosis not present

## 2013-05-09 DIAGNOSIS — R1311 Dysphagia, oral phase: Secondary | ICD-10-CM | POA: Diagnosis not present

## 2013-05-10 DIAGNOSIS — M169 Osteoarthritis of hip, unspecified: Secondary | ICD-10-CM | POA: Diagnosis not present

## 2013-05-10 DIAGNOSIS — IMO0002 Reserved for concepts with insufficient information to code with codable children: Secondary | ICD-10-CM | POA: Diagnosis not present

## 2013-05-10 DIAGNOSIS — G309 Alzheimer's disease, unspecified: Secondary | ICD-10-CM | POA: Diagnosis not present

## 2013-05-10 DIAGNOSIS — R1311 Dysphagia, oral phase: Secondary | ICD-10-CM | POA: Diagnosis not present

## 2013-05-10 DIAGNOSIS — G20 Parkinson's disease: Secondary | ICD-10-CM | POA: Diagnosis not present

## 2013-05-10 DIAGNOSIS — E119 Type 2 diabetes mellitus without complications: Secondary | ICD-10-CM | POA: Diagnosis not present

## 2013-05-13 ENCOUNTER — Other Ambulatory Visit: Payer: Self-pay | Admitting: Family Medicine

## 2013-05-13 DIAGNOSIS — R1311 Dysphagia, oral phase: Secondary | ICD-10-CM | POA: Diagnosis not present

## 2013-05-13 DIAGNOSIS — F0280 Dementia in other diseases classified elsewhere without behavioral disturbance: Secondary | ICD-10-CM | POA: Diagnosis not present

## 2013-05-13 DIAGNOSIS — IMO0002 Reserved for concepts with insufficient information to code with codable children: Secondary | ICD-10-CM | POA: Diagnosis not present

## 2013-05-13 DIAGNOSIS — M169 Osteoarthritis of hip, unspecified: Secondary | ICD-10-CM | POA: Diagnosis not present

## 2013-05-13 DIAGNOSIS — G20 Parkinson's disease: Secondary | ICD-10-CM | POA: Diagnosis not present

## 2013-05-13 DIAGNOSIS — E119 Type 2 diabetes mellitus without complications: Secondary | ICD-10-CM | POA: Diagnosis not present

## 2013-05-14 DIAGNOSIS — G20 Parkinson's disease: Secondary | ICD-10-CM | POA: Diagnosis not present

## 2013-05-14 DIAGNOSIS — R1311 Dysphagia, oral phase: Secondary | ICD-10-CM | POA: Diagnosis not present

## 2013-05-14 DIAGNOSIS — G309 Alzheimer's disease, unspecified: Secondary | ICD-10-CM | POA: Diagnosis not present

## 2013-05-14 DIAGNOSIS — E119 Type 2 diabetes mellitus without complications: Secondary | ICD-10-CM | POA: Diagnosis not present

## 2013-05-14 DIAGNOSIS — IMO0002 Reserved for concepts with insufficient information to code with codable children: Secondary | ICD-10-CM | POA: Diagnosis not present

## 2013-05-14 DIAGNOSIS — M169 Osteoarthritis of hip, unspecified: Secondary | ICD-10-CM | POA: Diagnosis not present

## 2013-05-15 DIAGNOSIS — F0280 Dementia in other diseases classified elsewhere without behavioral disturbance: Secondary | ICD-10-CM | POA: Diagnosis not present

## 2013-05-15 DIAGNOSIS — G20 Parkinson's disease: Secondary | ICD-10-CM | POA: Diagnosis not present

## 2013-05-15 DIAGNOSIS — E119 Type 2 diabetes mellitus without complications: Secondary | ICD-10-CM | POA: Diagnosis not present

## 2013-05-15 DIAGNOSIS — IMO0002 Reserved for concepts with insufficient information to code with codable children: Secondary | ICD-10-CM | POA: Diagnosis not present

## 2013-05-15 DIAGNOSIS — M169 Osteoarthritis of hip, unspecified: Secondary | ICD-10-CM | POA: Diagnosis not present

## 2013-05-15 DIAGNOSIS — R1311 Dysphagia, oral phase: Secondary | ICD-10-CM | POA: Diagnosis not present

## 2013-05-16 DIAGNOSIS — F0280 Dementia in other diseases classified elsewhere without behavioral disturbance: Secondary | ICD-10-CM | POA: Diagnosis not present

## 2013-05-16 DIAGNOSIS — M169 Osteoarthritis of hip, unspecified: Secondary | ICD-10-CM | POA: Diagnosis not present

## 2013-05-16 DIAGNOSIS — R1311 Dysphagia, oral phase: Secondary | ICD-10-CM | POA: Diagnosis not present

## 2013-05-16 DIAGNOSIS — G20 Parkinson's disease: Secondary | ICD-10-CM | POA: Diagnosis not present

## 2013-05-16 DIAGNOSIS — E119 Type 2 diabetes mellitus without complications: Secondary | ICD-10-CM | POA: Diagnosis not present

## 2013-05-16 DIAGNOSIS — IMO0002 Reserved for concepts with insufficient information to code with codable children: Secondary | ICD-10-CM | POA: Diagnosis not present

## 2013-05-17 DIAGNOSIS — I739 Peripheral vascular disease, unspecified: Secondary | ICD-10-CM | POA: Diagnosis not present

## 2013-05-20 DIAGNOSIS — IMO0002 Reserved for concepts with insufficient information to code with codable children: Secondary | ICD-10-CM | POA: Diagnosis not present

## 2013-05-20 DIAGNOSIS — G20 Parkinson's disease: Secondary | ICD-10-CM | POA: Diagnosis not present

## 2013-05-20 DIAGNOSIS — M169 Osteoarthritis of hip, unspecified: Secondary | ICD-10-CM | POA: Diagnosis not present

## 2013-05-20 DIAGNOSIS — G309 Alzheimer's disease, unspecified: Secondary | ICD-10-CM | POA: Diagnosis not present

## 2013-05-20 DIAGNOSIS — R1311 Dysphagia, oral phase: Secondary | ICD-10-CM | POA: Diagnosis not present

## 2013-05-20 DIAGNOSIS — E119 Type 2 diabetes mellitus without complications: Secondary | ICD-10-CM | POA: Diagnosis not present

## 2013-05-22 DIAGNOSIS — M169 Osteoarthritis of hip, unspecified: Secondary | ICD-10-CM | POA: Diagnosis not present

## 2013-05-22 DIAGNOSIS — E119 Type 2 diabetes mellitus without complications: Secondary | ICD-10-CM | POA: Diagnosis not present

## 2013-05-22 DIAGNOSIS — G309 Alzheimer's disease, unspecified: Secondary | ICD-10-CM | POA: Diagnosis not present

## 2013-05-22 DIAGNOSIS — R1311 Dysphagia, oral phase: Secondary | ICD-10-CM | POA: Diagnosis not present

## 2013-05-22 DIAGNOSIS — G20 Parkinson's disease: Secondary | ICD-10-CM | POA: Diagnosis not present

## 2013-05-22 DIAGNOSIS — IMO0002 Reserved for concepts with insufficient information to code with codable children: Secondary | ICD-10-CM | POA: Diagnosis not present

## 2013-05-23 DIAGNOSIS — G20 Parkinson's disease: Secondary | ICD-10-CM | POA: Diagnosis not present

## 2013-05-23 DIAGNOSIS — M169 Osteoarthritis of hip, unspecified: Secondary | ICD-10-CM | POA: Diagnosis not present

## 2013-05-23 DIAGNOSIS — IMO0002 Reserved for concepts with insufficient information to code with codable children: Secondary | ICD-10-CM | POA: Diagnosis not present

## 2013-05-23 DIAGNOSIS — E119 Type 2 diabetes mellitus without complications: Secondary | ICD-10-CM | POA: Diagnosis not present

## 2013-05-23 DIAGNOSIS — R1311 Dysphagia, oral phase: Secondary | ICD-10-CM | POA: Diagnosis not present

## 2013-05-23 DIAGNOSIS — F0280 Dementia in other diseases classified elsewhere without behavioral disturbance: Secondary | ICD-10-CM | POA: Diagnosis not present

## 2013-05-27 DIAGNOSIS — E119 Type 2 diabetes mellitus without complications: Secondary | ICD-10-CM | POA: Diagnosis not present

## 2013-05-27 DIAGNOSIS — M169 Osteoarthritis of hip, unspecified: Secondary | ICD-10-CM | POA: Diagnosis not present

## 2013-05-27 DIAGNOSIS — G20 Parkinson's disease: Secondary | ICD-10-CM | POA: Diagnosis not present

## 2013-05-27 DIAGNOSIS — R1311 Dysphagia, oral phase: Secondary | ICD-10-CM | POA: Diagnosis not present

## 2013-05-27 DIAGNOSIS — IMO0002 Reserved for concepts with insufficient information to code with codable children: Secondary | ICD-10-CM | POA: Diagnosis not present

## 2013-05-27 DIAGNOSIS — F0280 Dementia in other diseases classified elsewhere without behavioral disturbance: Secondary | ICD-10-CM | POA: Diagnosis not present

## 2013-05-28 DIAGNOSIS — M169 Osteoarthritis of hip, unspecified: Secondary | ICD-10-CM | POA: Diagnosis not present

## 2013-05-28 DIAGNOSIS — E119 Type 2 diabetes mellitus without complications: Secondary | ICD-10-CM | POA: Diagnosis not present

## 2013-05-28 DIAGNOSIS — IMO0002 Reserved for concepts with insufficient information to code with codable children: Secondary | ICD-10-CM | POA: Diagnosis not present

## 2013-05-28 DIAGNOSIS — G309 Alzheimer's disease, unspecified: Secondary | ICD-10-CM | POA: Diagnosis not present

## 2013-05-28 DIAGNOSIS — G20 Parkinson's disease: Secondary | ICD-10-CM | POA: Diagnosis not present

## 2013-05-28 DIAGNOSIS — R1311 Dysphagia, oral phase: Secondary | ICD-10-CM | POA: Diagnosis not present

## 2013-05-29 DIAGNOSIS — IMO0002 Reserved for concepts with insufficient information to code with codable children: Secondary | ICD-10-CM | POA: Diagnosis not present

## 2013-05-29 DIAGNOSIS — R1311 Dysphagia, oral phase: Secondary | ICD-10-CM | POA: Diagnosis not present

## 2013-05-29 DIAGNOSIS — E119 Type 2 diabetes mellitus without complications: Secondary | ICD-10-CM | POA: Diagnosis not present

## 2013-05-29 DIAGNOSIS — M169 Osteoarthritis of hip, unspecified: Secondary | ICD-10-CM | POA: Diagnosis not present

## 2013-05-29 DIAGNOSIS — G20 Parkinson's disease: Secondary | ICD-10-CM | POA: Diagnosis not present

## 2013-05-29 DIAGNOSIS — F0280 Dementia in other diseases classified elsewhere without behavioral disturbance: Secondary | ICD-10-CM | POA: Diagnosis not present

## 2013-05-30 ENCOUNTER — Ambulatory Visit (INDEPENDENT_AMBULATORY_CARE_PROVIDER_SITE_OTHER): Payer: Medicare Other | Admitting: Family Medicine

## 2013-05-30 ENCOUNTER — Encounter: Payer: Self-pay | Admitting: Family Medicine

## 2013-05-30 VITALS — BP 118/72 | HR 60

## 2013-05-30 DIAGNOSIS — E119 Type 2 diabetes mellitus without complications: Secondary | ICD-10-CM | POA: Diagnosis not present

## 2013-05-30 DIAGNOSIS — F0280 Dementia in other diseases classified elsewhere without behavioral disturbance: Secondary | ICD-10-CM | POA: Diagnosis not present

## 2013-05-30 DIAGNOSIS — R1311 Dysphagia, oral phase: Secondary | ICD-10-CM | POA: Diagnosis not present

## 2013-05-30 DIAGNOSIS — G20 Parkinson's disease: Secondary | ICD-10-CM | POA: Diagnosis not present

## 2013-05-30 DIAGNOSIS — F0390 Unspecified dementia without behavioral disturbance: Secondary | ICD-10-CM

## 2013-05-30 DIAGNOSIS — I10 Essential (primary) hypertension: Secondary | ICD-10-CM

## 2013-05-30 DIAGNOSIS — IMO0002 Reserved for concepts with insufficient information to code with codable children: Secondary | ICD-10-CM | POA: Diagnosis not present

## 2013-05-30 DIAGNOSIS — M169 Osteoarthritis of hip, unspecified: Secondary | ICD-10-CM | POA: Diagnosis not present

## 2013-05-30 NOTE — Progress Notes (Signed)
  Subjective:    Patient ID: [name] [name], female    DOB: 16-Oct-1924, 79 y.o.   MRN: [id number]  HPI Here for a follow up. Patient's daughter wants to discuss therapy she has been taking Patient overall did not show much progress with physical therapy so therefore it was stopped. No other particular problems. Does have dementia which is stable. Is not as communicative as possible this seems to be going downwards slightly in addition to this doesn't get up as much gently with family patient will get up move around some no other significant concerns currently PMH benign   Review of Systems See above, no cough fevers or vomiting    Objective:   Physical Exam Lungs are clear hearts regular pulse normal blood pressure good extremities no edema skin warm dry Patient with pleasant dementia yeah not in any distress       Assessment & Plan:  Alzheimer's-stable no other intervention necessary currently HTN stable may be able to decrease medication with time I do not feel this patient will benefit from a mammogram. I do not recommend colonoscopy. Patient entering into the next phase of her Alzheimer's family and facility doing a good job with her we will see her back in the fall

## 2013-05-31 DIAGNOSIS — R1311 Dysphagia, oral phase: Secondary | ICD-10-CM | POA: Diagnosis not present

## 2013-05-31 DIAGNOSIS — M169 Osteoarthritis of hip, unspecified: Secondary | ICD-10-CM | POA: Diagnosis not present

## 2013-05-31 DIAGNOSIS — G309 Alzheimer's disease, unspecified: Secondary | ICD-10-CM | POA: Diagnosis not present

## 2013-05-31 DIAGNOSIS — IMO0002 Reserved for concepts with insufficient information to code with codable children: Secondary | ICD-10-CM | POA: Diagnosis not present

## 2013-05-31 DIAGNOSIS — G20 Parkinson's disease: Secondary | ICD-10-CM | POA: Diagnosis not present

## 2013-05-31 DIAGNOSIS — E119 Type 2 diabetes mellitus without complications: Secondary | ICD-10-CM | POA: Diagnosis not present

## 2013-06-04 ENCOUNTER — Other Ambulatory Visit: Payer: Self-pay

## 2013-06-04 MED ORDER — PRIMIDONE 50 MG PO TABS: 25.0000 mg | ORAL_TABLET | Freq: Every day | ORAL | Status: DC

## 2013-06-16 ENCOUNTER — Emergency Department (HOSPITAL_COMMUNITY)
Admission: EM | Admit: 2013-06-16 | Discharge: 2013-06-16 | Disposition: A | Discharge status: Home / Self Care | Payer: Medicare Other | Attending: Emergency Medicine | Admitting: Emergency Medicine

## 2013-06-16 ENCOUNTER — Emergency Department (HOSPITAL_COMMUNITY): Payer: Medicare Other

## 2013-06-16 ENCOUNTER — Encounter (HOSPITAL_COMMUNITY): Payer: Self-pay

## 2013-06-16 DIAGNOSIS — E119 Type 2 diabetes mellitus without complications: Secondary | ICD-10-CM | POA: Diagnosis not present

## 2013-06-16 DIAGNOSIS — K219 Gastro-esophageal reflux disease without esophagitis: Secondary | ICD-10-CM | POA: Diagnosis not present

## 2013-06-16 DIAGNOSIS — Y92129 Unspecified place in nursing home as the place of occurrence of the external cause: Secondary | ICD-10-CM

## 2013-06-16 DIAGNOSIS — Z8739 Personal history of other diseases of the musculoskeletal system and connective tissue: Secondary | ICD-10-CM | POA: Diagnosis not present

## 2013-06-16 DIAGNOSIS — Z79899 Other long term (current) drug therapy: Secondary | ICD-10-CM | POA: Insufficient documentation

## 2013-06-16 DIAGNOSIS — M542 Cervicalgia: Secondary | ICD-10-CM | POA: Diagnosis not present

## 2013-06-16 DIAGNOSIS — Z8669 Personal history of other diseases of the nervous system and sense organs: Secondary | ICD-10-CM | POA: Insufficient documentation

## 2013-06-16 DIAGNOSIS — S0093XA Contusion of unspecified part of head, initial encounter: Secondary | ICD-10-CM

## 2013-06-16 DIAGNOSIS — G20A1 Parkinson's disease without dyskinesia, without mention of fluctuations: Secondary | ICD-10-CM | POA: Insufficient documentation

## 2013-06-16 DIAGNOSIS — F0280 Dementia in other diseases classified elsewhere without behavioral disturbance: Secondary | ICD-10-CM | POA: Insufficient documentation

## 2013-06-16 DIAGNOSIS — S0083XA Contusion of other part of head, initial encounter: Secondary | ICD-10-CM | POA: Diagnosis not present

## 2013-06-16 DIAGNOSIS — Z8719 Personal history of other diseases of the digestive system: Secondary | ICD-10-CM | POA: Diagnosis not present

## 2013-06-16 DIAGNOSIS — M1990 Unspecified osteoarthritis, unspecified site: Secondary | ICD-10-CM | POA: Insufficient documentation

## 2013-06-16 DIAGNOSIS — G20 Parkinson's disease: Secondary | ICD-10-CM | POA: Diagnosis not present

## 2013-06-16 DIAGNOSIS — R51 Headache: Secondary | ICD-10-CM | POA: Diagnosis not present

## 2013-06-16 DIAGNOSIS — Z87442 Personal history of urinary calculi: Secondary | ICD-10-CM | POA: Diagnosis not present

## 2013-06-16 DIAGNOSIS — Y92009 Unspecified place in unspecified non-institutional (private) residence as the place of occurrence of the external cause: Secondary | ICD-10-CM | POA: Insufficient documentation

## 2013-06-16 DIAGNOSIS — Z8679 Personal history of other diseases of the circulatory system: Secondary | ICD-10-CM | POA: Diagnosis not present

## 2013-06-16 DIAGNOSIS — Y9389 Activity, other specified: Secondary | ICD-10-CM | POA: Insufficient documentation

## 2013-06-16 DIAGNOSIS — T1490XA Injury, unspecified, initial encounter: Secondary | ICD-10-CM | POA: Diagnosis not present

## 2013-06-16 DIAGNOSIS — Z8639 Personal history of other endocrine, nutritional and metabolic disease: Secondary | ICD-10-CM | POA: Insufficient documentation

## 2013-06-16 DIAGNOSIS — S0003XA Contusion of scalp, initial encounter: Secondary | ICD-10-CM | POA: Insufficient documentation

## 2013-06-16 DIAGNOSIS — R296 Repeated falls: Secondary | ICD-10-CM | POA: Insufficient documentation

## 2013-06-16 DIAGNOSIS — F0390 Unspecified dementia without behavioral disturbance: Secondary | ICD-10-CM | POA: Diagnosis not present

## 2013-06-16 DIAGNOSIS — S1093XA Contusion of unspecified part of neck, initial encounter: Secondary | ICD-10-CM | POA: Diagnosis not present

## 2013-06-16 DIAGNOSIS — Z862 Personal history of diseases of the blood and blood-forming organs and certain disorders involving the immune mechanism: Secondary | ICD-10-CM | POA: Insufficient documentation

## 2013-06-16 DIAGNOSIS — G309 Alzheimer's disease, unspecified: Secondary | ICD-10-CM | POA: Insufficient documentation

## 2013-06-16 DIAGNOSIS — Z7982 Long term (current) use of aspirin: Secondary | ICD-10-CM | POA: Insufficient documentation

## 2013-06-16 DIAGNOSIS — I10 Essential (primary) hypertension: Secondary | ICD-10-CM | POA: Diagnosis not present

## 2013-06-16 DIAGNOSIS — S199XXA Unspecified injury of neck, initial encounter: Secondary | ICD-10-CM | POA: Diagnosis not present

## 2013-06-16 DIAGNOSIS — S0990XA Unspecified injury of head, initial encounter: Secondary | ICD-10-CM | POA: Diagnosis not present

## 2013-06-16 NOTE — ED Notes (Signed)
Pt is from [facility] where she reportedly fell while trying to get on the commode.  Pt with dementia, c/o pain all over

## 2013-06-16 NOTE — ED Provider Notes (Signed)
CSN: [id number]     Arrival date & time 06/16/13  2121 History  This chart was scribed for Ward Givens, MD by Ardelia Mems, ED Scribe. This patient was seen in room [facility] and the patient's care was started at 9:52 PM.   First MD Initiated Contact with Patient 06/16/13 2137     Chief Complaint  Patient presents with  . Fall    The history is provided by the patient, the nursing home and a relative. The history is limited by the condition of the patient. No language interpreter was used.   LEVEL 5 CAVEAT FOR DEMENTIA   HPI Comments: [name] [name] is a 79 y.o. female with a history of dementia brought from [place] to the Emergency Department presenting for evaluation after an accidental fall that occurred earlier today. Pt has dementia and pt's granddaughter is in the room providing history. Granddaughter states that pt reportedly fell and hit her head in the bathroom, while attempting to get off of the commode. Granddaughter is unsure if the fall was witnessed. Pt has a history of falling, hitting her head and having a brain bleed, and was in ICU for a couple days and sent home. Granddaughter states that pt takes 81 mg Aspirin daily, but that she takes no other blood thinning medications.  PCP- Dr. Lilyan Punt   Past Medical History  Diagnosis Date  . ASCVD (arteriosclerotic cardiovascular disease) 2003    Single vessel disease-60% D1  . Hypertension   . Hyperlipidemia   . Diabetes mellitus     No insulin  . Leukopenia     and anemia-hematology evaluation was nodiagnostic  . Degenerative joint disease     right TKA in 4/06  . Upper GI bleed     gastric stress ulcer in 2003  . Retinal artery branch occlusion of right eye     Sudden onset of right eye blindness-embolism suspected  . Nephrolithiasis   . Gastroesophageal reflux disease with hiatal hernia   . Osteoporosis   . Lung nodule     Left lower lobe  . Parkinson's disease   . Dementia   . Cervical  spondylarthritis     S/p discectomy  . Alzheimer disease    Past Surgical History  Procedure Laterality Date  . Total abdominal hysterectomy    . Cervical discectomy      Dr. Newell Coral  . Cholecystectomy    . Total knee arthroplasty  2006    Right; Dr. Eulah Pont  . Colonoscopy  2009  . Joint replacement    . Vascular surgery    . Cystoscopy w/ ureteral stent placement  09/23/2011    Procedure: CYSTOSCOPY WITH RETROGRADE PYELOGRAM/URETERAL STENT PLACEMENT;  Surgeon: Ky Barban;  Location: [facility];  Service: Urology;  Laterality: Right;   Family History  Problem Relation Age of Onset  . Diabetes Sister   . Heart disease Sister   . Diabetes Brother    History  Substance Use Topics  . Smoking status: Never Smoker   . Smokeless tobacco: Never Used  . Alcohol Use: No  lives in assisted living faciltiy   OB History   Grav Para Term Preterm Abortions TAB SAB Ect Mult Living                 Review of Systems  Unable to perform ROS: Dementia    Allergies  Codeine and Morphine  Home Medications   Current Outpatient Rx  Name  Route  Sig  Dispense  Refill  . acetaminophen (TYLENOL) 500 MG tablet   Oral   Take 500 mg by mouth 2 (two) times daily. Or as needed for headache         . amoxicillin (AMOXIL) 250 MG capsule   Oral   Take 250 mg by mouth at bedtime.         Marland Kitchen aspirin 81 MG tablet   Oral   Take 81 mg by mouth daily.           Marland Kitchen atropine 1 % ophthalmic solution   Right Eye   Place 1 drop into the right eye 2 (two) times daily.          . citalopram (CELEXA) 20 MG tablet   Oral   Take 20 mg by mouth daily.         . Difluprednate (DUREZOL) 0.05 % EMUL   Right Eye   Place 1 drop into the right eye 2 (two) times daily. RIGHT EYE         . furosemide (LASIX) 20 MG tablet   Oral   Take 1 tablet (20 mg total) by mouth daily.   30 tablet   5   . loratadine (CLARITIN) 10 MG tablet   Oral   Take 10 mg by mouth daily.         Marland Kitchen losartan  (COZAAR) 25 MG tablet   Oral   Take 25 mg by mouth daily.           . memantine (NAMENDA) 10 MG tablet   Oral   Take 10 mg by mouth 2 (two) times daily.           . methylcellulose (ARTIFICIAL TEARS) 1 % ophthalmic solution   Ophthalmic   Apply 1 drop to eye daily.          Marland Kitchen omeprazole (PRILOSEC) 40 MG capsule   Oral   Take 40 mg by mouth daily.         . polyethylene glycol powder (GLYCOLAX/MIRALAX) powder   Oral   Take 17 g by mouth daily. *Mixed in 8 ounces of water or juice and drink once daily*         . potassium chloride (K-DUR,KLOR-CON) 10 MEQ tablet   Oral   Take 10 mEq by mouth daily.         . primidone (MYSOLINE) 50 MG tablet   Oral   Take 0.5 tablets (25 mg total) by mouth at bedtime.   15 tablet   0   . sucralfate (CARAFATE) 1 G tablet   Oral   Take 1 g by mouth 4 (four) times daily. *Crushed and mixed with small amount of liquid as directed by physician*         . Vitamin B Complex-C CAPS   Oral   Take 1 capsule by mouth daily.   30 each   12   . vitamin E 400 UNIT capsule   Oral   Take 400 Units by mouth daily.         . Albuterol Sulfate (PROAIR HFA IN)   Inhalation   Inhale into the lungs. 2 puffs every 4 hours as needed         . mometasone (ELOCON) 0.1 % cream   Topical   Apply topically daily. Apply to affected areas as needed. Do not apply to face         . ondansetron (ZOFRAN-ODT) 4 MG disintegrating tablet   Oral  Take 4 mg by mouth. Take one every 6 hours as needed for nausea          Triage Vitals: BP 148/50  Pulse 70  Temp(Src) 97.4 F (36.3 C) (Oral)  Resp 18  Ht 5' (1.524 m)  Wt 165 lb (74.844 kg)  BMI 32.22 kg/m2  SpO2 97%  Vital signs normal    Physical Exam  Nursing note and vitals reviewed. Constitutional: She appears well-developed and well-nourished.  Non-toxic appearance. She does not appear ill. No distress.  HENT:  Head: Normocephalic and atraumatic.  Right Ear: External ear  normal.  Left Ear: External ear normal.  Nose: Nose normal. No mucosal edema or rhinorrhea.  Mouth/Throat: Oropharynx is clear and moist and mucous membranes are normal. No dental abscesses or edematous.  No obvious injury seen such as bruising  Eyes:  Right cornea is opaque and the eyeball looks shrunken and is sunken into the socket.  Neck: Full passive range of motion without pain.  Has mild tenderness  Cardiovascular: Normal rate, regular rhythm and normal heart sounds.  Exam reveals no gallop and no friction rub.   No murmur heard. Pulmonary/Chest: Effort normal and breath sounds normal. No respiratory distress. She has no wheezes. She has no rhonchi. She has no rales. She exhibits no tenderness and no crepitus.  Abdominal: Soft. Normal appearance and bowel sounds are normal. She exhibits no distension. There is no tenderness. There is no rebound and no guarding.  Musculoskeletal: Normal range of motion. She exhibits no edema and no tenderness.  Moves all extremities well.   Neurological: She is alert. She has normal strength. No cranial nerve deficit.  Able to follow commands.  Skin: Skin is warm, dry and intact. No rash noted. No erythema. No pallor.  Psychiatric: She has a normal mood and affect. Her speech is normal and behavior is normal. Her mood appears not anxious.    ED Course     Procedures (including critical care time)  DIAGNOSTIC STUDIES: Oxygen Saturation is 97% on RA, normal by my interpretation.    COORDINATION OF CARE: 9:57 PM- Pt's granddaughter advised of plan for diagnostic radiology and pt 's granddaughter agrees.   22:47 Granddaughter given results of scans.     Ct Head Wo Contrast Ct Cervical Spine Wo Contrast  06/16/2013   *RADIOLOGY REPORT*  Clinical Data:  Fall with pain.  CT HEAD WITHOUT CONTRAST CT CERVICAL SPINE WITHOUT CONTRAST  Technique:  Multidetector CT imaging of the head and cervical spine was performed following the standard protocol  without intravenous contrast.  Multiplanar CT image reconstructions of the cervical spine were also generated.  Comparison:  06/06/2012  CT HEAD  Findings: There is stable marked atrophy and advanced small vessel disease of the periventricular white matter.  The brain demonstrates no evidence of hemorrhage, acute infarction, edema, mass effect, extra-axial fluid collection, hydrocephalus or mass lesion.  The skull is unremarkable.  IMPRESSION: No acute findings.  Stable atrophy and advanced small vessel disease.  CT CERVICAL SPINE  Findings: The cervical spine shows stable fusion at the C6-7 level with no change in positioning of hardware.  Fusion screws appear intact.  The disc space level is completely fused.  Other levels shows stable spondylosis and proliferative changes without evidence of acute fracture or subluxation.  No soft tissue swelling is identified.  The visualized airway is unremarkable.  No incidental masses.  IMPRESSION: Stable cervical spine CT without acute fracture identified.   Original Report Authenticated By: Sherrine Maples  Fredia Sorrow, M.D.    1. Fall at nursing home, initial encounter   2. Contusion of head, initial encounter   3. Dementia, without behavioral disturbance     Plan discharge   Devoria Albe, MD, FACEP   MDM   I personally performed the services described in this documentation, which was scribed in my presence. The recorded information has been reviewed and considered.   Devoria Albe, MD, Armando Gang    Ward Givens, MD 06/16/13 2251

## 2013-06-16 NOTE — ED Notes (Signed)
Report called to [facility]. Pt was transported back to [facility] by family. Paperwork sent w/ family.

## 2013-06-16 NOTE — ED Notes (Signed)
Reported pt found in floor. Pt able to move all ext. Pt stating she feels ok now. No deformity noted.

## 2013-06-17 DIAGNOSIS — F0390 Unspecified dementia without behavioral disturbance: Secondary | ICD-10-CM | POA: Diagnosis not present

## 2013-06-17 DIAGNOSIS — R488 Other symbolic dysfunctions: Secondary | ICD-10-CM | POA: Diagnosis not present

## 2013-06-17 DIAGNOSIS — R1311 Dysphagia, oral phase: Secondary | ICD-10-CM | POA: Diagnosis not present

## 2013-06-19 DIAGNOSIS — R488 Other symbolic dysfunctions: Secondary | ICD-10-CM | POA: Diagnosis not present

## 2013-06-19 DIAGNOSIS — F0390 Unspecified dementia without behavioral disturbance: Secondary | ICD-10-CM | POA: Diagnosis not present

## 2013-06-19 DIAGNOSIS — R1311 Dysphagia, oral phase: Secondary | ICD-10-CM | POA: Diagnosis not present

## 2013-06-20 DIAGNOSIS — R1311 Dysphagia, oral phase: Secondary | ICD-10-CM | POA: Diagnosis not present

## 2013-06-20 DIAGNOSIS — R488 Other symbolic dysfunctions: Secondary | ICD-10-CM | POA: Diagnosis not present

## 2013-06-20 DIAGNOSIS — N390 Urinary tract infection, site not specified: Secondary | ICD-10-CM | POA: Diagnosis not present

## 2013-06-20 DIAGNOSIS — F0390 Unspecified dementia without behavioral disturbance: Secondary | ICD-10-CM | POA: Diagnosis not present

## 2013-06-25 ENCOUNTER — Other Ambulatory Visit: Payer: Self-pay | Admitting: *Deleted

## 2013-06-25 ENCOUNTER — Other Ambulatory Visit: Payer: Self-pay | Admitting: Family Medicine

## 2013-06-25 DIAGNOSIS — R1311 Dysphagia, oral phase: Secondary | ICD-10-CM | POA: Diagnosis not present

## 2013-06-25 DIAGNOSIS — R279 Unspecified lack of coordination: Secondary | ICD-10-CM | POA: Diagnosis not present

## 2013-06-25 DIAGNOSIS — F0390 Unspecified dementia without behavioral disturbance: Secondary | ICD-10-CM | POA: Diagnosis not present

## 2013-06-25 DIAGNOSIS — M159 Polyosteoarthritis, unspecified: Secondary | ICD-10-CM | POA: Diagnosis not present

## 2013-06-25 DIAGNOSIS — R488 Other symbolic dysfunctions: Secondary | ICD-10-CM | POA: Diagnosis not present

## 2013-06-25 MED ORDER — CIPROFLOXACIN HCL 250 MG PO TABS: ORAL_TABLET | ORAL | Status: AC

## 2013-06-26 DIAGNOSIS — R488 Other symbolic dysfunctions: Secondary | ICD-10-CM | POA: Diagnosis not present

## 2013-06-26 DIAGNOSIS — R279 Unspecified lack of coordination: Secondary | ICD-10-CM | POA: Diagnosis not present

## 2013-06-26 DIAGNOSIS — F0390 Unspecified dementia without behavioral disturbance: Secondary | ICD-10-CM | POA: Diagnosis not present

## 2013-06-26 DIAGNOSIS — M159 Polyosteoarthritis, unspecified: Secondary | ICD-10-CM | POA: Diagnosis not present

## 2013-06-26 DIAGNOSIS — R1311 Dysphagia, oral phase: Secondary | ICD-10-CM | POA: Diagnosis not present

## 2013-06-27 DIAGNOSIS — R488 Other symbolic dysfunctions: Secondary | ICD-10-CM | POA: Diagnosis not present

## 2013-06-27 DIAGNOSIS — F0390 Unspecified dementia without behavioral disturbance: Secondary | ICD-10-CM | POA: Diagnosis not present

## 2013-06-27 DIAGNOSIS — R1311 Dysphagia, oral phase: Secondary | ICD-10-CM | POA: Diagnosis not present

## 2013-06-27 DIAGNOSIS — R279 Unspecified lack of coordination: Secondary | ICD-10-CM | POA: Diagnosis not present

## 2013-06-27 DIAGNOSIS — M159 Polyosteoarthritis, unspecified: Secondary | ICD-10-CM | POA: Diagnosis not present

## 2013-06-28 ENCOUNTER — Ambulatory Visit: Payer: Medicare Other | Admitting: Neurology

## 2013-07-01 DIAGNOSIS — F0390 Unspecified dementia without behavioral disturbance: Secondary | ICD-10-CM | POA: Diagnosis not present

## 2013-07-01 DIAGNOSIS — R279 Unspecified lack of coordination: Secondary | ICD-10-CM | POA: Diagnosis not present

## 2013-07-01 DIAGNOSIS — R488 Other symbolic dysfunctions: Secondary | ICD-10-CM | POA: Diagnosis not present

## 2013-07-01 DIAGNOSIS — R1311 Dysphagia, oral phase: Secondary | ICD-10-CM | POA: Diagnosis not present

## 2013-07-01 DIAGNOSIS — M159 Polyosteoarthritis, unspecified: Secondary | ICD-10-CM | POA: Diagnosis not present

## 2013-07-02 DIAGNOSIS — R279 Unspecified lack of coordination: Secondary | ICD-10-CM | POA: Diagnosis not present

## 2013-07-02 DIAGNOSIS — R488 Other symbolic dysfunctions: Secondary | ICD-10-CM | POA: Diagnosis not present

## 2013-07-02 DIAGNOSIS — F0390 Unspecified dementia without behavioral disturbance: Secondary | ICD-10-CM | POA: Diagnosis not present

## 2013-07-02 DIAGNOSIS — M159 Polyosteoarthritis, unspecified: Secondary | ICD-10-CM | POA: Diagnosis not present

## 2013-07-02 DIAGNOSIS — R1311 Dysphagia, oral phase: Secondary | ICD-10-CM | POA: Diagnosis not present

## 2013-07-03 DIAGNOSIS — M159 Polyosteoarthritis, unspecified: Secondary | ICD-10-CM | POA: Diagnosis not present

## 2013-07-03 DIAGNOSIS — F0390 Unspecified dementia without behavioral disturbance: Secondary | ICD-10-CM | POA: Diagnosis not present

## 2013-07-03 DIAGNOSIS — R1311 Dysphagia, oral phase: Secondary | ICD-10-CM | POA: Diagnosis not present

## 2013-07-03 DIAGNOSIS — R279 Unspecified lack of coordination: Secondary | ICD-10-CM | POA: Diagnosis not present

## 2013-07-03 DIAGNOSIS — R488 Other symbolic dysfunctions: Secondary | ICD-10-CM | POA: Diagnosis not present

## 2013-07-04 ENCOUNTER — Ambulatory Visit (INDEPENDENT_AMBULATORY_CARE_PROVIDER_SITE_OTHER): Payer: Medicare Other | Admitting: Neurology

## 2013-07-04 ENCOUNTER — Encounter: Payer: Self-pay | Admitting: Neurology

## 2013-07-04 VITALS — BP 124/55 | HR 76 | Temp 98.3°F

## 2013-07-04 DIAGNOSIS — G250 Essential tremor: Secondary | ICD-10-CM | POA: Diagnosis not present

## 2013-07-04 DIAGNOSIS — F0280 Dementia in other diseases classified elsewhere without behavioral disturbance: Secondary | ICD-10-CM

## 2013-07-04 DIAGNOSIS — G309 Alzheimer's disease, unspecified: Secondary | ICD-10-CM

## 2013-07-04 DIAGNOSIS — G252 Other specified forms of tremor: Secondary | ICD-10-CM | POA: Diagnosis not present

## 2013-07-04 DIAGNOSIS — Z9181 History of falling: Secondary | ICD-10-CM

## 2013-07-04 MED ORDER — PRIMIDONE 50 MG PO TABS: 25.0000 mg | ORAL_TABLET | Freq: Every day | ORAL | Status: DC

## 2013-07-04 NOTE — Progress Notes (Signed)
Subjective:    Patient ID: [name] [name] is a 79 y.o. female.  HPI  Interim history:   [name] [name] is a very pleasant 79 year old right-handed woman who presents for followup consultation of her dementia. I last saw her on 01/11/2014, at which time I felt she had advanced dementia, gait disturbance, tremors consistent with essential tremor and no evidence of parkinsonism. I had first seen her on 12/21/2012 and had suggested a trial of Mysoline. In the interim she was seen in the emergency room on 06/16/2013 after she had fallen and possibly hit her head. She had no overt injury. She lives at the [facility] in the assisted living facility. She had a head CT which did not show any intracranial hemorrhage advanced atrophy. She had a cervical spine CT which did not show any fracture. I originally started her on a very small dose of Mysoline in January, a quarter of a pill and then increase it in February 2 half a pill each night. She has been established on Mysoline for the past 6 months. I discussed with her granddaughter at Mysoline does have a tendency to sit days and call us balance problems but in light of her being stable on the same dose I am convinced that it is not connected to her recent fall. Apparently she does not typically have a tendency to get out of bed at night once she is in bed. She fell around 11 PM. Nobody witnessed the fall but she did apparently need to go to the bathroom she fell in the bathroom. As far as her tremors, she has had improvement in her hand tremors and this helps her feed herself. She has had dementia for years. Per granddaughter she was originally treated with Aricept and then Namenda was added and for some reason several years ago Aricept was discontinued. The granddaughter does not recall the reason and is not sure that Cherree have any side effects with that. She will check with Dr. Gerda Diss on that.   Her Past Medical History Is Significant For: Past Medical  History  Diagnosis Date  . ASCVD (arteriosclerotic cardiovascular disease) 2003    Single vessel disease-60% D1  . Hypertension   . Hyperlipidemia   . Diabetes mellitus     No insulin  . Leukopenia     and anemia-hematology evaluation was nodiagnostic  . Degenerative joint disease     right TKA in 4/06  . Upper GI bleed     gastric stress ulcer in 2003  . Retinal artery branch occlusion of right eye     Sudden onset of right eye blindness-embolism suspected  . Nephrolithiasis   . Gastroesophageal reflux disease with hiatal hernia   . Osteoporosis   . Lung nodule     Left lower lobe  . Parkinson's disease   . Dementia   . Cervical spondylarthritis     S/p discectomy  . Alzheimer disease     Her Past Surgical History Is Significant For: Past Surgical History  Procedure Laterality Date  . Total abdominal hysterectomy    . Cervical discectomy      Dr. Newell Coral  . Cholecystectomy    . Total knee arthroplasty  2006    Right; Dr. Eulah Pont  . Colonoscopy  2009  . Joint replacement    . Vascular surgery    . Cystoscopy w/ ureteral stent placement  09/23/2011    Procedure: CYSTOSCOPY WITH RETROGRADE PYELOGRAM/URETERAL STENT PLACEMENT;  Surgeon: Ky Barban;  Location: [facility];  Service: Urology;  Laterality: Right;    Her Family History Is Significant For: Family History  Problem Relation Age of Onset  . Diabetes Sister   . Heart disease Sister   . Diabetes Brother     Her Social History Is Significant For: History   Social History  . Marital Status: Widowed    Spouse Name: N/A    Number of Children: N/A  . Years of Education: N/A   Social History Main Topics  . Smoking status: Never Smoker   . Smokeless tobacco: Never Used  . Alcohol Use: No  . Drug Use: No  . Sexual Activity: None   Other Topics Concern  . None   Social History Narrative   Retired from [employer]     Her Allergies Are:  Allergies  Allergen Reactions  . Codeine Other  (See Comments)    REACTION: Unknown  . Morphine Nausea And Vomiting  :   Her Current Medications Are:  Outpatient Encounter Prescriptions as of 07/04/2013  Medication Sig Dispense Refill  . Albuterol Sulfate (PROAIR HFA IN) Inhale into the lungs. 2 puffs every 4 hours as needed      . amoxicillin (AMOXIL) 250 MG capsule Take 250 mg by mouth at bedtime.      Marland Kitchen aspirin 81 MG tablet Take 81 mg by mouth daily.        Marland Kitchen atropine 1 % ophthalmic solution Place 1 drop into the right eye 2 (two) times daily.       . citalopram (CELEXA) 20 MG tablet Take 20 mg by mouth daily.      . Difluprednate (DUREZOL) 0.05 % EMUL Place 1 drop into the right eye 2 (two) times daily. RIGHT EYE      . furosemide (LASIX) 20 MG tablet Take 1 tablet (20 mg total) by mouth daily.  30 tablet  5  . loratadine (CLARITIN) 10 MG tablet Take 10 mg by mouth daily.      Marland Kitchen losartan (COZAAR) 25 MG tablet Take 25 mg by mouth daily.        . memantine (NAMENDA) 10 MG tablet Take 10 mg by mouth 2 (two) times daily.        . methylcellulose (ARTIFICIAL TEARS) 1 % ophthalmic solution Apply 1 drop to eye daily.       . mometasone (ELOCON) 0.1 % cream Apply topically daily. Apply to affected areas as needed. Do not apply to face      . omeprazole (PRILOSEC) 40 MG capsule Take 40 mg by mouth daily.      . ondansetron (ZOFRAN-ODT) 4 MG disintegrating tablet Take 4 mg by mouth. Take one every 6 hours as needed for nausea      . polyethylene glycol powder (GLYCOLAX/MIRALAX) powder Take 17 g by mouth daily. *Mixed in 8 ounces of water or juice and drink once daily*      . potassium chloride (K-DUR,KLOR-CON) 10 MEQ tablet Take 10 mEq by mouth daily.      . primidone (MYSOLINE) 50 MG tablet Take 0.5 tablets (25 mg total) by mouth at bedtime.  15 tablet  5  . Q-PAP 500 MG tablet TAKE 1 TABLET BY MOUTH ONCE DAILY AS NEEDED FOR HEADACHE.  30 tablet  3  . sucralfate (CARAFATE) 1 G tablet Take 1 g by mouth 4 (four) times daily. *Crushed and mixed  with small amount of liquid as directed by physician*      . Vitamin B Complex-C CAPS  Take 1 capsule by mouth daily.  30 each  12  . vitamin E 400 UNIT capsule Take 400 Units by mouth daily.      . [DISCONTINUED] primidone (MYSOLINE) 50 MG tablet Take 0.5 tablets (25 mg total) by mouth at bedtime.  15 tablet  0  . [DISCONTINUED] primidone (MYSOLINE) 50 MG tablet Take 0.5 tablets (25 mg total) by mouth at bedtime.  15 tablet  0   No facility-administered encounter medications on file as of 07/04/2013.  :  Review of Systems  All other systems reviewed and are negative.    Objective:  Neurologic Exam  Physical Exam Physical Examination:   Filed Vitals:   07/04/13 1517  BP: 124/55  Pulse: 76  Temp: 98.3 F (36.8 C)    Physical Examination:   Filed Vitals:   07/04/13 1517  BP: 124/55  Pulse: 76  Temp: 98.3 F (36.8 C)    On general exam: The patient is a frail-appearing elderly lady situated in a wheelchair in no apparent distress. She is very friendly and mostly cooperative but is unable to perform multistep tasks and has trouble with mimicking. She is unable to provide history. She answers questions but they are out of context. She is mostly only able to follow one-step commands.  HEENT exam. She has right enophthalmos and right ptosis, unchanged as well as poor tracking. Face is symmetric with fairly normal facial animation. She has no lip or neck tremor today. Neck is fairly supple once she is able to relax. Full range of motion is noted. Hearing is impaired. Speech is very mildly dysarthric and at times difficult to understand. Chest is clear to auscultation bilaterally, heart sounds are normal and abdominal sounds are normal, abdomen is soft and nontender. She has no pitting edema in the distal lower extremities. Mucosal membranes are slightly dry and skin is warm and dry. Neurologically: Mental status: The patient is awake and paying little attention. She is not oriented except  for self. Cranial nerves are as described under HEENT exam. Motor exam: Fairly normal bulk is noted for age. Tone is fairly N. She does not exhibit much in the way of resting tremor in the upper extremities today. she has an intermittent postural tremor and some action tremor. Fine motor skills are mildly impaired in both upper and lower extremities. I did not have her stand or walk for me today, as they did not bring her 2-wheeled walker.  Sensory exam is intact to light touch throughout. She's not able to participate for finger to nose testing or more extensive sensory testing.   Assessment and Plan:   In summary, Ms. Burklow is a very pleasant 79 year old right-handed female with a history of advanced dementia, gait disturbance and tremors, c/w essential tremor which has progressed over the years and started many years ago. She continues to have no evidence of parkinsonism. She has gait disturbance, which is most likely multifactorial, d/t vision impairment, advanced age, and advanced dementia and progressive tremors. There seems to be a family history of hand tremors in at least 2 of her daughters. At this juncture I think she can continue with low dose primidone and even though it can cause sedation and balance problems she has been using this with no problems for the past 6 months. I do not think it is to blame for her recent fall. She is however at fall risk. Her granddaughter believes that the tremor is less with medication and that helps her feet herself.  I advised granddaughter that she should be monitored for fall risk and that she should use her walker at all times and not try to get out of bed at night on her own. Unfortunately there is no fall alert button they can use at the assisted living place at this time. For her to advanced dementia she is now only on Namenda but at one point in the past had been on both Namenda and Aricept. I've asked Neysa Bonito to try to find out why the Aricept was stopped.  It is possible that the patient had side effects. I would like to see her back in routine followup in 4 months from now, sooner if the need arises. Neysa Bonito was in agreement. I renewed the prescription for primidone today.

## 2013-07-04 NOTE — Patient Instructions (Signed)
I think overall you are doing fairly well but I do want to suggest a few things today:  Remember to drink plenty of fluid, eat healthy meals and do not skip any meals. Try to eat protein with a every meal and eat a healthy snack such as fruit or nuts in between meals. Try to keep a regular sleep-wake schedule and try to exercise daily, particularly in the form of walking, 10-20 minutes a day, if you can.   Use your walker at all times. Do not get out of bed alone at night.   As far as your medications are concerned, I would like to suggest no changes.    As far as diagnostic testing: no new test  I would like to see you back in 4 months, sooner if we need to. Please call us with any interim questions, concerns, problems, updates or refill requests.  Brett Canales is my clinical assistant and will answer any of your questions and relay your messages to me and also relay most of my messages to you.  Our phone number is [phone]. We also have an after hours call service for urgent matters and there is a physician on-call for urgent questions. For any emergencies you know to call 911 or go to the nearest emergency room.

## 2013-07-05 ENCOUNTER — Other Ambulatory Visit: Payer: Self-pay | Admitting: Family Medicine

## 2013-07-05 DIAGNOSIS — R488 Other symbolic dysfunctions: Secondary | ICD-10-CM | POA: Diagnosis not present

## 2013-07-05 DIAGNOSIS — R279 Unspecified lack of coordination: Secondary | ICD-10-CM | POA: Diagnosis not present

## 2013-07-05 DIAGNOSIS — M159 Polyosteoarthritis, unspecified: Secondary | ICD-10-CM | POA: Diagnosis not present

## 2013-07-05 DIAGNOSIS — F0390 Unspecified dementia without behavioral disturbance: Secondary | ICD-10-CM | POA: Diagnosis not present

## 2013-07-05 DIAGNOSIS — R1311 Dysphagia, oral phase: Secondary | ICD-10-CM | POA: Diagnosis not present

## 2013-07-08 DIAGNOSIS — R279 Unspecified lack of coordination: Secondary | ICD-10-CM | POA: Diagnosis not present

## 2013-07-08 DIAGNOSIS — R488 Other symbolic dysfunctions: Secondary | ICD-10-CM | POA: Diagnosis not present

## 2013-07-08 DIAGNOSIS — F0390 Unspecified dementia without behavioral disturbance: Secondary | ICD-10-CM | POA: Diagnosis not present

## 2013-07-08 DIAGNOSIS — M159 Polyosteoarthritis, unspecified: Secondary | ICD-10-CM | POA: Diagnosis not present

## 2013-07-08 DIAGNOSIS — R1311 Dysphagia, oral phase: Secondary | ICD-10-CM | POA: Diagnosis not present

## 2013-07-09 ENCOUNTER — Telehealth: Payer: Self-pay | Admitting: Family Medicine

## 2013-07-09 DIAGNOSIS — F0390 Unspecified dementia without behavioral disturbance: Secondary | ICD-10-CM | POA: Diagnosis not present

## 2013-07-09 DIAGNOSIS — R1311 Dysphagia, oral phase: Secondary | ICD-10-CM | POA: Diagnosis not present

## 2013-07-09 DIAGNOSIS — R279 Unspecified lack of coordination: Secondary | ICD-10-CM | POA: Diagnosis not present

## 2013-07-09 DIAGNOSIS — M159 Polyosteoarthritis, unspecified: Secondary | ICD-10-CM | POA: Diagnosis not present

## 2013-07-09 DIAGNOSIS — R488 Other symbolic dysfunctions: Secondary | ICD-10-CM | POA: Diagnosis not present

## 2013-07-09 NOTE — Telephone Encounter (Signed)
Nurses please talk with her granddaughter. Certainly I am open to having her on both medications. According to our records the patient is currently taking Namenda. As for Aricept several years ago this was stopped do to excessive cost and she was unable to afford it while in the [facility]. If they would like it restarted we can do so. Please make sure the granddaughter is the healthcare power of attorney or if she is not then she needs to have her mother who I believe could be a healthcare power of attorney authorize this decision thank you let me know.

## 2013-07-09 NOTE — Telephone Encounter (Signed)
pts grand daughter calling to see if (per Dr [name] [name], neurologist) you wanted to put [name] [name] on Aricept and [name] [name] and if you would agree to this again. Dr [name] [name] feels that these two drugs per studies of late, may help the slow down the progression of the dementia.

## 2013-07-10 DIAGNOSIS — M159 Polyosteoarthritis, unspecified: Secondary | ICD-10-CM | POA: Diagnosis not present

## 2013-07-10 DIAGNOSIS — F0390 Unspecified dementia without behavioral disturbance: Secondary | ICD-10-CM | POA: Diagnosis not present

## 2013-07-10 DIAGNOSIS — R279 Unspecified lack of coordination: Secondary | ICD-10-CM | POA: Diagnosis not present

## 2013-07-10 DIAGNOSIS — R488 Other symbolic dysfunctions: Secondary | ICD-10-CM | POA: Diagnosis not present

## 2013-07-10 DIAGNOSIS — R1311 Dysphagia, oral phase: Secondary | ICD-10-CM | POA: Diagnosis not present

## 2013-07-10 NOTE — Telephone Encounter (Signed)
She wants your opinion on it. The neurologist talked like they would see a great benefit and it would greatly improve her condition . [name] [name] wants your opinion on that and if you think it will be worth it to try it- do you think they will see great improvement on both meds verus just on one.

## 2013-07-10 NOTE — Telephone Encounter (Signed)
Left message on voicemail to return call. [name] [name] is listed as a power of attorney for this patient.

## 2013-07-11 DIAGNOSIS — F0390 Unspecified dementia without behavioral disturbance: Secondary | ICD-10-CM | POA: Diagnosis not present

## 2013-07-11 DIAGNOSIS — R488 Other symbolic dysfunctions: Secondary | ICD-10-CM | POA: Diagnosis not present

## 2013-07-11 DIAGNOSIS — R1311 Dysphagia, oral phase: Secondary | ICD-10-CM | POA: Diagnosis not present

## 2013-07-11 DIAGNOSIS — R279 Unspecified lack of coordination: Secondary | ICD-10-CM | POA: Diagnosis not present

## 2013-07-11 DIAGNOSIS — M159 Polyosteoarthritis, unspecified: Secondary | ICD-10-CM | POA: Diagnosis not present

## 2013-07-11 MED ORDER — DONEPEZIL HCL 5 MG PO TABS: 5.0000 mg | ORAL_TABLET | Freq: Every evening | ORAL | Status: DC | PRN

## 2013-07-11 NOTE — Telephone Encounter (Signed)
Rx was sent electronically to [facility]. Discussed with family.

## 2013-07-11 NOTE — Telephone Encounter (Signed)
It would be fine to restart Aricept 5 mg daily. #30. 3 refills. Followup within 30-60 days with a family member to let us know if they have seen any improvement. It may improve how she is doing it may not. I believe it is worth trying.

## 2013-07-12 ENCOUNTER — Ambulatory Visit: Payer: Medicare Other | Admitting: Cardiovascular Disease

## 2013-07-12 ENCOUNTER — Other Ambulatory Visit: Payer: Self-pay | Admitting: Family Medicine

## 2013-07-13 DIAGNOSIS — M159 Polyosteoarthritis, unspecified: Secondary | ICD-10-CM | POA: Diagnosis not present

## 2013-07-13 DIAGNOSIS — R1311 Dysphagia, oral phase: Secondary | ICD-10-CM | POA: Diagnosis not present

## 2013-07-13 DIAGNOSIS — R488 Other symbolic dysfunctions: Secondary | ICD-10-CM | POA: Diagnosis not present

## 2013-07-13 DIAGNOSIS — F0390 Unspecified dementia without behavioral disturbance: Secondary | ICD-10-CM | POA: Diagnosis not present

## 2013-07-13 DIAGNOSIS — R279 Unspecified lack of coordination: Secondary | ICD-10-CM | POA: Diagnosis not present

## 2013-07-15 ENCOUNTER — Other Ambulatory Visit: Payer: Self-pay | Admitting: Family Medicine

## 2013-07-15 DIAGNOSIS — R488 Other symbolic dysfunctions: Secondary | ICD-10-CM | POA: Diagnosis not present

## 2013-07-15 DIAGNOSIS — R279 Unspecified lack of coordination: Secondary | ICD-10-CM | POA: Diagnosis not present

## 2013-07-15 DIAGNOSIS — M159 Polyosteoarthritis, unspecified: Secondary | ICD-10-CM | POA: Diagnosis not present

## 2013-07-15 DIAGNOSIS — F0390 Unspecified dementia without behavioral disturbance: Secondary | ICD-10-CM | POA: Diagnosis not present

## 2013-07-15 DIAGNOSIS — R1311 Dysphagia, oral phase: Secondary | ICD-10-CM | POA: Diagnosis not present

## 2013-07-16 DIAGNOSIS — R488 Other symbolic dysfunctions: Secondary | ICD-10-CM | POA: Diagnosis not present

## 2013-07-16 DIAGNOSIS — F0390 Unspecified dementia without behavioral disturbance: Secondary | ICD-10-CM | POA: Diagnosis not present

## 2013-07-16 DIAGNOSIS — R279 Unspecified lack of coordination: Secondary | ICD-10-CM | POA: Diagnosis not present

## 2013-07-16 DIAGNOSIS — R1311 Dysphagia, oral phase: Secondary | ICD-10-CM | POA: Diagnosis not present

## 2013-07-16 DIAGNOSIS — M159 Polyosteoarthritis, unspecified: Secondary | ICD-10-CM | POA: Diagnosis not present

## 2013-07-17 DIAGNOSIS — F0390 Unspecified dementia without behavioral disturbance: Secondary | ICD-10-CM | POA: Diagnosis not present

## 2013-07-17 DIAGNOSIS — M159 Polyosteoarthritis, unspecified: Secondary | ICD-10-CM | POA: Diagnosis not present

## 2013-07-17 DIAGNOSIS — R279 Unspecified lack of coordination: Secondary | ICD-10-CM | POA: Diagnosis not present

## 2013-07-17 DIAGNOSIS — R1311 Dysphagia, oral phase: Secondary | ICD-10-CM | POA: Diagnosis not present

## 2013-07-17 DIAGNOSIS — R488 Other symbolic dysfunctions: Secondary | ICD-10-CM | POA: Diagnosis not present

## 2013-07-18 DIAGNOSIS — R279 Unspecified lack of coordination: Secondary | ICD-10-CM | POA: Diagnosis not present

## 2013-07-18 DIAGNOSIS — F0390 Unspecified dementia without behavioral disturbance: Secondary | ICD-10-CM | POA: Diagnosis not present

## 2013-07-18 DIAGNOSIS — R1311 Dysphagia, oral phase: Secondary | ICD-10-CM | POA: Diagnosis not present

## 2013-07-18 DIAGNOSIS — R488 Other symbolic dysfunctions: Secondary | ICD-10-CM | POA: Diagnosis not present

## 2013-07-18 DIAGNOSIS — M159 Polyosteoarthritis, unspecified: Secondary | ICD-10-CM | POA: Diagnosis not present

## 2013-07-19 DIAGNOSIS — R1311 Dysphagia, oral phase: Secondary | ICD-10-CM | POA: Diagnosis not present

## 2013-07-19 DIAGNOSIS — F0390 Unspecified dementia without behavioral disturbance: Secondary | ICD-10-CM | POA: Diagnosis not present

## 2013-07-19 DIAGNOSIS — R488 Other symbolic dysfunctions: Secondary | ICD-10-CM | POA: Diagnosis not present

## 2013-07-19 DIAGNOSIS — M159 Polyosteoarthritis, unspecified: Secondary | ICD-10-CM | POA: Diagnosis not present

## 2013-07-19 DIAGNOSIS — R279 Unspecified lack of coordination: Secondary | ICD-10-CM | POA: Diagnosis not present

## 2013-07-22 ENCOUNTER — Other Ambulatory Visit: Payer: Self-pay | Admitting: Family Medicine

## 2013-07-23 DIAGNOSIS — R1311 Dysphagia, oral phase: Secondary | ICD-10-CM | POA: Diagnosis not present

## 2013-07-23 DIAGNOSIS — M159 Polyosteoarthritis, unspecified: Secondary | ICD-10-CM | POA: Diagnosis not present

## 2013-07-23 DIAGNOSIS — F0390 Unspecified dementia without behavioral disturbance: Secondary | ICD-10-CM | POA: Diagnosis not present

## 2013-07-23 DIAGNOSIS — R279 Unspecified lack of coordination: Secondary | ICD-10-CM | POA: Diagnosis not present

## 2013-07-23 DIAGNOSIS — R488 Other symbolic dysfunctions: Secondary | ICD-10-CM | POA: Diagnosis not present

## 2013-07-24 DIAGNOSIS — R488 Other symbolic dysfunctions: Secondary | ICD-10-CM | POA: Diagnosis not present

## 2013-07-24 DIAGNOSIS — M159 Polyosteoarthritis, unspecified: Secondary | ICD-10-CM | POA: Diagnosis not present

## 2013-07-24 DIAGNOSIS — F0390 Unspecified dementia without behavioral disturbance: Secondary | ICD-10-CM | POA: Diagnosis not present

## 2013-07-24 DIAGNOSIS — R279 Unspecified lack of coordination: Secondary | ICD-10-CM | POA: Diagnosis not present

## 2013-07-24 DIAGNOSIS — R1311 Dysphagia, oral phase: Secondary | ICD-10-CM | POA: Diagnosis not present

## 2013-07-25 DIAGNOSIS — R1311 Dysphagia, oral phase: Secondary | ICD-10-CM | POA: Diagnosis not present

## 2013-07-25 DIAGNOSIS — R279 Unspecified lack of coordination: Secondary | ICD-10-CM | POA: Diagnosis not present

## 2013-07-25 DIAGNOSIS — M159 Polyosteoarthritis, unspecified: Secondary | ICD-10-CM | POA: Diagnosis not present

## 2013-07-25 DIAGNOSIS — R488 Other symbolic dysfunctions: Secondary | ICD-10-CM | POA: Diagnosis not present

## 2013-07-25 DIAGNOSIS — F0390 Unspecified dementia without behavioral disturbance: Secondary | ICD-10-CM | POA: Diagnosis not present

## 2013-07-26 DIAGNOSIS — R279 Unspecified lack of coordination: Secondary | ICD-10-CM | POA: Diagnosis not present

## 2013-07-26 DIAGNOSIS — R1311 Dysphagia, oral phase: Secondary | ICD-10-CM | POA: Diagnosis not present

## 2013-07-26 DIAGNOSIS — R488 Other symbolic dysfunctions: Secondary | ICD-10-CM | POA: Diagnosis not present

## 2013-07-26 DIAGNOSIS — M159 Polyosteoarthritis, unspecified: Secondary | ICD-10-CM | POA: Diagnosis not present

## 2013-07-26 DIAGNOSIS — F0390 Unspecified dementia without behavioral disturbance: Secondary | ICD-10-CM | POA: Diagnosis not present

## 2013-07-28 DIAGNOSIS — M159 Polyosteoarthritis, unspecified: Secondary | ICD-10-CM | POA: Diagnosis not present

## 2013-07-28 DIAGNOSIS — F0390 Unspecified dementia without behavioral disturbance: Secondary | ICD-10-CM | POA: Diagnosis not present

## 2013-07-28 DIAGNOSIS — R488 Other symbolic dysfunctions: Secondary | ICD-10-CM | POA: Diagnosis not present

## 2013-07-28 DIAGNOSIS — R279 Unspecified lack of coordination: Secondary | ICD-10-CM | POA: Diagnosis not present

## 2013-07-28 DIAGNOSIS — R1311 Dysphagia, oral phase: Secondary | ICD-10-CM | POA: Diagnosis not present

## 2013-07-29 ENCOUNTER — Other Ambulatory Visit: Payer: Self-pay | Admitting: Family Medicine

## 2013-07-29 DIAGNOSIS — R279 Unspecified lack of coordination: Secondary | ICD-10-CM | POA: Diagnosis not present

## 2013-07-29 DIAGNOSIS — R1311 Dysphagia, oral phase: Secondary | ICD-10-CM | POA: Diagnosis not present

## 2013-07-29 DIAGNOSIS — F0390 Unspecified dementia without behavioral disturbance: Secondary | ICD-10-CM | POA: Diagnosis not present

## 2013-07-29 DIAGNOSIS — M159 Polyosteoarthritis, unspecified: Secondary | ICD-10-CM | POA: Diagnosis not present

## 2013-07-29 DIAGNOSIS — R488 Other symbolic dysfunctions: Secondary | ICD-10-CM | POA: Diagnosis not present

## 2013-07-30 ENCOUNTER — Other Ambulatory Visit: Payer: Self-pay | Admitting: Family Medicine

## 2013-07-30 DIAGNOSIS — R488 Other symbolic dysfunctions: Secondary | ICD-10-CM | POA: Diagnosis not present

## 2013-07-30 DIAGNOSIS — R1311 Dysphagia, oral phase: Secondary | ICD-10-CM | POA: Diagnosis not present

## 2013-07-30 DIAGNOSIS — R279 Unspecified lack of coordination: Secondary | ICD-10-CM | POA: Diagnosis not present

## 2013-07-30 DIAGNOSIS — M159 Polyosteoarthritis, unspecified: Secondary | ICD-10-CM | POA: Diagnosis not present

## 2013-07-30 DIAGNOSIS — F0390 Unspecified dementia without behavioral disturbance: Secondary | ICD-10-CM | POA: Diagnosis not present

## 2013-07-31 DIAGNOSIS — R1311 Dysphagia, oral phase: Secondary | ICD-10-CM | POA: Diagnosis not present

## 2013-07-31 DIAGNOSIS — M159 Polyosteoarthritis, unspecified: Secondary | ICD-10-CM | POA: Diagnosis not present

## 2013-07-31 DIAGNOSIS — R488 Other symbolic dysfunctions: Secondary | ICD-10-CM | POA: Diagnosis not present

## 2013-07-31 DIAGNOSIS — F0390 Unspecified dementia without behavioral disturbance: Secondary | ICD-10-CM | POA: Diagnosis not present

## 2013-07-31 DIAGNOSIS — R279 Unspecified lack of coordination: Secondary | ICD-10-CM | POA: Diagnosis not present

## 2013-08-01 DIAGNOSIS — R279 Unspecified lack of coordination: Secondary | ICD-10-CM | POA: Diagnosis not present

## 2013-08-01 DIAGNOSIS — R1311 Dysphagia, oral phase: Secondary | ICD-10-CM | POA: Diagnosis not present

## 2013-08-01 DIAGNOSIS — F0390 Unspecified dementia without behavioral disturbance: Secondary | ICD-10-CM | POA: Diagnosis not present

## 2013-08-01 DIAGNOSIS — R488 Other symbolic dysfunctions: Secondary | ICD-10-CM | POA: Diagnosis not present

## 2013-08-01 DIAGNOSIS — M159 Polyosteoarthritis, unspecified: Secondary | ICD-10-CM | POA: Diagnosis not present

## 2013-08-02 DIAGNOSIS — M159 Polyosteoarthritis, unspecified: Secondary | ICD-10-CM | POA: Diagnosis not present

## 2013-08-02 DIAGNOSIS — R1311 Dysphagia, oral phase: Secondary | ICD-10-CM | POA: Diagnosis not present

## 2013-08-02 DIAGNOSIS — F0390 Unspecified dementia without behavioral disturbance: Secondary | ICD-10-CM | POA: Diagnosis not present

## 2013-08-02 DIAGNOSIS — R279 Unspecified lack of coordination: Secondary | ICD-10-CM | POA: Diagnosis not present

## 2013-08-02 DIAGNOSIS — R488 Other symbolic dysfunctions: Secondary | ICD-10-CM | POA: Diagnosis not present

## 2013-08-05 DIAGNOSIS — M159 Polyosteoarthritis, unspecified: Secondary | ICD-10-CM | POA: Diagnosis not present

## 2013-08-05 DIAGNOSIS — R1311 Dysphagia, oral phase: Secondary | ICD-10-CM | POA: Diagnosis not present

## 2013-08-05 DIAGNOSIS — R488 Other symbolic dysfunctions: Secondary | ICD-10-CM | POA: Diagnosis not present

## 2013-08-05 DIAGNOSIS — F0390 Unspecified dementia without behavioral disturbance: Secondary | ICD-10-CM | POA: Diagnosis not present

## 2013-08-05 DIAGNOSIS — R279 Unspecified lack of coordination: Secondary | ICD-10-CM | POA: Diagnosis not present

## 2013-08-06 DIAGNOSIS — R1311 Dysphagia, oral phase: Secondary | ICD-10-CM | POA: Diagnosis not present

## 2013-08-06 DIAGNOSIS — R488 Other symbolic dysfunctions: Secondary | ICD-10-CM | POA: Diagnosis not present

## 2013-08-06 DIAGNOSIS — R279 Unspecified lack of coordination: Secondary | ICD-10-CM | POA: Diagnosis not present

## 2013-08-06 DIAGNOSIS — M159 Polyosteoarthritis, unspecified: Secondary | ICD-10-CM | POA: Diagnosis not present

## 2013-08-06 DIAGNOSIS — F0390 Unspecified dementia without behavioral disturbance: Secondary | ICD-10-CM | POA: Diagnosis not present

## 2013-08-07 DIAGNOSIS — R279 Unspecified lack of coordination: Secondary | ICD-10-CM | POA: Diagnosis not present

## 2013-08-07 DIAGNOSIS — M159 Polyosteoarthritis, unspecified: Secondary | ICD-10-CM | POA: Diagnosis not present

## 2013-08-07 DIAGNOSIS — R488 Other symbolic dysfunctions: Secondary | ICD-10-CM | POA: Diagnosis not present

## 2013-08-07 DIAGNOSIS — R1311 Dysphagia, oral phase: Secondary | ICD-10-CM | POA: Diagnosis not present

## 2013-08-07 DIAGNOSIS — F0390 Unspecified dementia without behavioral disturbance: Secondary | ICD-10-CM | POA: Diagnosis not present

## 2013-08-08 DIAGNOSIS — L609 Nail disorder, unspecified: Secondary | ICD-10-CM | POA: Diagnosis not present

## 2013-08-08 DIAGNOSIS — M159 Polyosteoarthritis, unspecified: Secondary | ICD-10-CM | POA: Diagnosis not present

## 2013-08-08 DIAGNOSIS — R488 Other symbolic dysfunctions: Secondary | ICD-10-CM | POA: Diagnosis not present

## 2013-08-08 DIAGNOSIS — R279 Unspecified lack of coordination: Secondary | ICD-10-CM | POA: Diagnosis not present

## 2013-08-08 DIAGNOSIS — F0390 Unspecified dementia without behavioral disturbance: Secondary | ICD-10-CM | POA: Diagnosis not present

## 2013-08-08 DIAGNOSIS — R1311 Dysphagia, oral phase: Secondary | ICD-10-CM | POA: Diagnosis not present

## 2013-08-08 DIAGNOSIS — I70209 Unspecified atherosclerosis of native arteries of extremities, unspecified extremity: Secondary | ICD-10-CM | POA: Diagnosis not present

## 2013-08-08 DIAGNOSIS — L851 Acquired keratosis [keratoderma] palmaris et plantaris: Secondary | ICD-10-CM | POA: Diagnosis not present

## 2013-08-09 DIAGNOSIS — R1311 Dysphagia, oral phase: Secondary | ICD-10-CM | POA: Diagnosis not present

## 2013-08-09 DIAGNOSIS — F0390 Unspecified dementia without behavioral disturbance: Secondary | ICD-10-CM | POA: Diagnosis not present

## 2013-08-09 DIAGNOSIS — H44529 Atrophy of globe, unspecified eye: Secondary | ICD-10-CM | POA: Diagnosis not present

## 2013-08-09 DIAGNOSIS — R279 Unspecified lack of coordination: Secondary | ICD-10-CM | POA: Diagnosis not present

## 2013-08-09 DIAGNOSIS — M159 Polyosteoarthritis, unspecified: Secondary | ICD-10-CM | POA: Diagnosis not present

## 2013-08-09 DIAGNOSIS — H348192 Central retinal vein occlusion, unspecified eye, stable: Secondary | ICD-10-CM | POA: Diagnosis not present

## 2013-08-09 DIAGNOSIS — R488 Other symbolic dysfunctions: Secondary | ICD-10-CM | POA: Diagnosis not present

## 2013-08-12 ENCOUNTER — Other Ambulatory Visit: Payer: Self-pay | Admitting: Family Medicine

## 2013-08-12 DIAGNOSIS — R488 Other symbolic dysfunctions: Secondary | ICD-10-CM | POA: Diagnosis not present

## 2013-08-12 DIAGNOSIS — F0390 Unspecified dementia without behavioral disturbance: Secondary | ICD-10-CM | POA: Diagnosis not present

## 2013-08-12 DIAGNOSIS — R1311 Dysphagia, oral phase: Secondary | ICD-10-CM | POA: Diagnosis not present

## 2013-08-12 DIAGNOSIS — M159 Polyosteoarthritis, unspecified: Secondary | ICD-10-CM | POA: Diagnosis not present

## 2013-08-12 DIAGNOSIS — R279 Unspecified lack of coordination: Secondary | ICD-10-CM | POA: Diagnosis not present

## 2013-08-13 DIAGNOSIS — R488 Other symbolic dysfunctions: Secondary | ICD-10-CM | POA: Diagnosis not present

## 2013-08-13 DIAGNOSIS — M159 Polyosteoarthritis, unspecified: Secondary | ICD-10-CM | POA: Diagnosis not present

## 2013-08-13 DIAGNOSIS — R279 Unspecified lack of coordination: Secondary | ICD-10-CM | POA: Diagnosis not present

## 2013-08-13 DIAGNOSIS — R1311 Dysphagia, oral phase: Secondary | ICD-10-CM | POA: Diagnosis not present

## 2013-08-13 DIAGNOSIS — F0390 Unspecified dementia without behavioral disturbance: Secondary | ICD-10-CM | POA: Diagnosis not present

## 2013-08-14 DIAGNOSIS — R488 Other symbolic dysfunctions: Secondary | ICD-10-CM | POA: Diagnosis not present

## 2013-08-14 DIAGNOSIS — F0390 Unspecified dementia without behavioral disturbance: Secondary | ICD-10-CM | POA: Diagnosis not present

## 2013-08-14 DIAGNOSIS — M159 Polyosteoarthritis, unspecified: Secondary | ICD-10-CM | POA: Diagnosis not present

## 2013-08-14 DIAGNOSIS — R279 Unspecified lack of coordination: Secondary | ICD-10-CM | POA: Diagnosis not present

## 2013-08-14 DIAGNOSIS — R1311 Dysphagia, oral phase: Secondary | ICD-10-CM | POA: Diagnosis not present

## 2013-08-15 DIAGNOSIS — R488 Other symbolic dysfunctions: Secondary | ICD-10-CM | POA: Diagnosis not present

## 2013-08-15 DIAGNOSIS — R1311 Dysphagia, oral phase: Secondary | ICD-10-CM | POA: Diagnosis not present

## 2013-08-15 DIAGNOSIS — R279 Unspecified lack of coordination: Secondary | ICD-10-CM | POA: Diagnosis not present

## 2013-08-15 DIAGNOSIS — F0390 Unspecified dementia without behavioral disturbance: Secondary | ICD-10-CM | POA: Diagnosis not present

## 2013-08-15 DIAGNOSIS — M159 Polyosteoarthritis, unspecified: Secondary | ICD-10-CM | POA: Diagnosis not present

## 2013-08-16 DIAGNOSIS — R279 Unspecified lack of coordination: Secondary | ICD-10-CM | POA: Diagnosis not present

## 2013-08-16 DIAGNOSIS — F0390 Unspecified dementia without behavioral disturbance: Secondary | ICD-10-CM | POA: Diagnosis not present

## 2013-08-16 DIAGNOSIS — R1311 Dysphagia, oral phase: Secondary | ICD-10-CM | POA: Diagnosis not present

## 2013-08-16 DIAGNOSIS — R488 Other symbolic dysfunctions: Secondary | ICD-10-CM | POA: Diagnosis not present

## 2013-08-16 DIAGNOSIS — M159 Polyosteoarthritis, unspecified: Secondary | ICD-10-CM | POA: Diagnosis not present

## 2013-08-19 ENCOUNTER — Other Ambulatory Visit: Payer: Self-pay | Admitting: Family Medicine

## 2013-08-19 DIAGNOSIS — R279 Unspecified lack of coordination: Secondary | ICD-10-CM | POA: Diagnosis not present

## 2013-08-19 DIAGNOSIS — F0390 Unspecified dementia without behavioral disturbance: Secondary | ICD-10-CM | POA: Diagnosis not present

## 2013-08-19 DIAGNOSIS — R1311 Dysphagia, oral phase: Secondary | ICD-10-CM | POA: Diagnosis not present

## 2013-08-19 DIAGNOSIS — R488 Other symbolic dysfunctions: Secondary | ICD-10-CM | POA: Diagnosis not present

## 2013-08-19 DIAGNOSIS — M159 Polyosteoarthritis, unspecified: Secondary | ICD-10-CM | POA: Diagnosis not present

## 2013-08-20 DIAGNOSIS — R1311 Dysphagia, oral phase: Secondary | ICD-10-CM | POA: Diagnosis not present

## 2013-08-20 DIAGNOSIS — F0390 Unspecified dementia without behavioral disturbance: Secondary | ICD-10-CM | POA: Diagnosis not present

## 2013-08-20 DIAGNOSIS — M159 Polyosteoarthritis, unspecified: Secondary | ICD-10-CM | POA: Diagnosis not present

## 2013-08-20 DIAGNOSIS — R279 Unspecified lack of coordination: Secondary | ICD-10-CM | POA: Diagnosis not present

## 2013-08-20 DIAGNOSIS — R488 Other symbolic dysfunctions: Secondary | ICD-10-CM | POA: Diagnosis not present

## 2013-08-21 DIAGNOSIS — R279 Unspecified lack of coordination: Secondary | ICD-10-CM | POA: Diagnosis not present

## 2013-08-21 DIAGNOSIS — M159 Polyosteoarthritis, unspecified: Secondary | ICD-10-CM | POA: Diagnosis not present

## 2013-08-23 DIAGNOSIS — M159 Polyosteoarthritis, unspecified: Secondary | ICD-10-CM | POA: Diagnosis not present

## 2013-08-23 DIAGNOSIS — R279 Unspecified lack of coordination: Secondary | ICD-10-CM | POA: Diagnosis not present

## 2013-08-26 DIAGNOSIS — R279 Unspecified lack of coordination: Secondary | ICD-10-CM | POA: Diagnosis not present

## 2013-08-26 DIAGNOSIS — M159 Polyosteoarthritis, unspecified: Secondary | ICD-10-CM | POA: Diagnosis not present

## 2013-08-27 DIAGNOSIS — M159 Polyosteoarthritis, unspecified: Secondary | ICD-10-CM | POA: Diagnosis not present

## 2013-08-27 DIAGNOSIS — R279 Unspecified lack of coordination: Secondary | ICD-10-CM | POA: Diagnosis not present

## 2013-08-28 DIAGNOSIS — M159 Polyosteoarthritis, unspecified: Secondary | ICD-10-CM | POA: Diagnosis not present

## 2013-08-28 DIAGNOSIS — R279 Unspecified lack of coordination: Secondary | ICD-10-CM | POA: Diagnosis not present

## 2013-08-30 DIAGNOSIS — R279 Unspecified lack of coordination: Secondary | ICD-10-CM | POA: Diagnosis not present

## 2013-08-30 DIAGNOSIS — M159 Polyosteoarthritis, unspecified: Secondary | ICD-10-CM | POA: Diagnosis not present

## 2013-09-02 DIAGNOSIS — R279 Unspecified lack of coordination: Secondary | ICD-10-CM | POA: Diagnosis not present

## 2013-09-02 DIAGNOSIS — M159 Polyosteoarthritis, unspecified: Secondary | ICD-10-CM | POA: Diagnosis not present

## 2013-09-04 ENCOUNTER — Other Ambulatory Visit: Payer: Self-pay | Admitting: Family Medicine

## 2013-09-04 DIAGNOSIS — R279 Unspecified lack of coordination: Secondary | ICD-10-CM | POA: Diagnosis not present

## 2013-09-04 DIAGNOSIS — M159 Polyosteoarthritis, unspecified: Secondary | ICD-10-CM | POA: Diagnosis not present

## 2013-09-05 ENCOUNTER — Other Ambulatory Visit: Payer: Self-pay | Admitting: Family Medicine

## 2013-09-05 ENCOUNTER — Ambulatory Visit: Payer: Medicare Other | Admitting: Family Medicine

## 2013-09-06 ENCOUNTER — Encounter: Payer: Self-pay | Admitting: Family Medicine

## 2013-09-06 ENCOUNTER — Ambulatory Visit (INDEPENDENT_AMBULATORY_CARE_PROVIDER_SITE_OTHER): Payer: Medicare Other | Admitting: Family Medicine

## 2013-09-06 VITALS — BP 110/64 | HR 70 | Ht 60.0 in

## 2013-09-06 DIAGNOSIS — L853 Xerosis cutis: Secondary | ICD-10-CM

## 2013-09-06 DIAGNOSIS — E119 Type 2 diabetes mellitus without complications: Secondary | ICD-10-CM

## 2013-09-06 DIAGNOSIS — Z23 Encounter for immunization: Secondary | ICD-10-CM | POA: Diagnosis not present

## 2013-09-06 DIAGNOSIS — G250 Essential tremor: Secondary | ICD-10-CM | POA: Diagnosis not present

## 2013-09-06 DIAGNOSIS — M159 Polyosteoarthritis, unspecified: Secondary | ICD-10-CM | POA: Diagnosis not present

## 2013-09-06 DIAGNOSIS — I10 Essential (primary) hypertension: Secondary | ICD-10-CM | POA: Diagnosis not present

## 2013-09-06 DIAGNOSIS — L738 Other specified follicular disorders: Secondary | ICD-10-CM

## 2013-09-06 DIAGNOSIS — R279 Unspecified lack of coordination: Secondary | ICD-10-CM | POA: Diagnosis not present

## 2013-09-06 NOTE — Progress Notes (Signed)
  Subjective:    Patient ID: [name] [name], female    DOB: [birth year], 79 y.o.   MRN: [id number]  HPI Patient here for an 3 month follow up Has lost a few pounds since last being seen appetite has been good no rectal bleeding no vomiting. Patient has an rash on left arm that itches, this been going on for weeks worse since the weather is becoming cooler Patient still has significant Alzheimer's. She has a hard time following directions. PMH hypertension, diabetes, dementia Family history noncontributory Social stays at assisted living facility Review of Systems Negative for chest pain shortness breath vomiting diarrhea rectal bleeding hematuria    Objective:   Physical Exam Lungs are clear heart is regular pulses normal abdomen obese extremities no edema skin warm dry significant eczema noted on the right arm with excoriations from itching       Assessment & Plan:  #1 dry skin eczema right arm-lotion on a daily basis plus also Elocon cream twice a day also keep area covered with a nonstick dressing use paper tape avoid regular Band-Aids. Followup if problems. #2 Alzheimer's-on medication seems to be improving slightly. She was very talkative and today's exam but was obviously disoriented. #3 HTN decent control continue current measures #4 diabetes good control continue current measures #5 slight weight loss I feel this is related to Alzheimer's. I would recommend following this and rechecking again in 3 months time 25 minutes spent with patient and family

## 2013-09-09 DIAGNOSIS — R279 Unspecified lack of coordination: Secondary | ICD-10-CM | POA: Diagnosis not present

## 2013-09-09 DIAGNOSIS — M159 Polyosteoarthritis, unspecified: Secondary | ICD-10-CM | POA: Diagnosis not present

## 2013-09-10 DIAGNOSIS — R279 Unspecified lack of coordination: Secondary | ICD-10-CM | POA: Diagnosis not present

## 2013-09-10 DIAGNOSIS — M159 Polyosteoarthritis, unspecified: Secondary | ICD-10-CM | POA: Diagnosis not present

## 2013-09-11 ENCOUNTER — Telehealth: Payer: Self-pay | Admitting: Family Medicine

## 2013-09-11 ENCOUNTER — Encounter: Payer: Self-pay | Admitting: Family Medicine

## 2013-09-11 ENCOUNTER — Ambulatory Visit (INDEPENDENT_AMBULATORY_CARE_PROVIDER_SITE_OTHER): Payer: Medicare Other | Admitting: Family Medicine

## 2013-09-11 VITALS — BP 132/62 | Ht 60.0 in

## 2013-09-11 DIAGNOSIS — L259 Unspecified contact dermatitis, unspecified cause: Secondary | ICD-10-CM

## 2013-09-11 DIAGNOSIS — M159 Polyosteoarthritis, unspecified: Secondary | ICD-10-CM | POA: Diagnosis not present

## 2013-09-11 DIAGNOSIS — L0291 Cutaneous abscess, unspecified: Secondary | ICD-10-CM | POA: Diagnosis not present

## 2013-09-11 DIAGNOSIS — R279 Unspecified lack of coordination: Secondary | ICD-10-CM | POA: Diagnosis not present

## 2013-09-11 MED ORDER — DOXYCYCLINE HYCLATE 100 MG PO CAPS: 100.0000 mg | ORAL_CAPSULE | Freq: Two times a day (BID) | ORAL | Status: DC

## 2013-09-11 NOTE — Progress Notes (Signed)
  Subjective:    Patient ID: [name] [name], female    DOB: 1924-03-05, 79 y.o.   MRN: [id number]  HPI Patient is here today for a boil that is on her right wrist.   Patient had severe eczema on the right arm it was starting to get better but then she picked at it and it got an area that was infected and they come in today to have this taken care of. Denies any other particular problems. No high fever or vomiting. Patient does have dementia. PMH  Review of Systems No fevers no vomiting no headache.    Objective:   Physical Exam Patient with significant abscess on the right arm lungs are clear hearts regular  That area was numbed with 1% lidocaine without difficulty. The area also had #11 blade to drain it culture was taken and sent.     Assessment & Plan:  Probable MRSA infection dressing was placed home health nurse will do daily wound changes at the Center. Also start doxycycline twice a day for the next 7 days take with a snack in a glass of water afterwards. Recheck here in 48 hours.

## 2013-09-11 NOTE — Telephone Encounter (Signed)
Appointment made at front desk for patient.

## 2013-09-11 NOTE — Telephone Encounter (Signed)
Pts. Granddaughter calling to say that her eczema is worse an now has a boil the size of a gumball. [facility] nurse stated that the boil looked like it needed to be lanced. Please call to advise what they need to do.

## 2013-09-12 DIAGNOSIS — F0280 Dementia in other diseases classified elsewhere without behavioral disturbance: Secondary | ICD-10-CM | POA: Diagnosis not present

## 2013-09-12 DIAGNOSIS — I10 Essential (primary) hypertension: Secondary | ICD-10-CM | POA: Diagnosis not present

## 2013-09-12 DIAGNOSIS — L02519 Cutaneous abscess of unspecified hand: Secondary | ICD-10-CM | POA: Diagnosis not present

## 2013-09-12 DIAGNOSIS — E119 Type 2 diabetes mellitus without complications: Secondary | ICD-10-CM | POA: Diagnosis not present

## 2013-09-13 ENCOUNTER — Encounter: Payer: Self-pay | Admitting: Family Medicine

## 2013-09-13 ENCOUNTER — Ambulatory Visit (INDEPENDENT_AMBULATORY_CARE_PROVIDER_SITE_OTHER): Payer: Medicare Other | Admitting: Family Medicine

## 2013-09-13 VITALS — BP 132/82 | Ht 60.0 in

## 2013-09-13 DIAGNOSIS — L0291 Cutaneous abscess, unspecified: Secondary | ICD-10-CM | POA: Diagnosis not present

## 2013-09-13 NOTE — Progress Notes (Signed)
  Subjective:    Patient ID: [name] [name], female    DOB: Apr 22, 1924, 79 y.o.   MRN: [id number]  HPI  Patient arrives for a recheck of cellulitis on her right wrist. Daughter states that the assisted living center said patient needed some pain meds for her hand.  On the Doxy. Culture still pending. Growing staph does far. Sensitivities unavailable.  No fever or chills. History complicated by dementia.  Review of Systems No headache no vomiting no cough ROS otherwise negative    Objective:   Physical Exam  Alert no apparent distress vitals stable. Lungs clear. Heart regular in rhythm. Right arm dorsal ulceration erythema tender some improvement per caretaker      Assessment & Plan:  Impression cellulitis discussed plan maintain same approach. Twice a day dressing add hydrocodone when necessary for pain. Followup with Dr. Lorin Picket. [facility]

## 2013-09-14 DIAGNOSIS — E119 Type 2 diabetes mellitus without complications: Secondary | ICD-10-CM | POA: Diagnosis not present

## 2013-09-14 DIAGNOSIS — I10 Essential (primary) hypertension: Secondary | ICD-10-CM | POA: Diagnosis not present

## 2013-09-14 DIAGNOSIS — L02519 Cutaneous abscess of unspecified hand: Secondary | ICD-10-CM | POA: Diagnosis not present

## 2013-09-14 DIAGNOSIS — F0280 Dementia in other diseases classified elsewhere without behavioral disturbance: Secondary | ICD-10-CM | POA: Diagnosis not present

## 2013-09-14 LAB — WOUND CULTURE: Gram Stain: NONE SEEN

## 2013-09-15 DIAGNOSIS — F0280 Dementia in other diseases classified elsewhere without behavioral disturbance: Secondary | ICD-10-CM | POA: Diagnosis not present

## 2013-09-15 DIAGNOSIS — L02519 Cutaneous abscess of unspecified hand: Secondary | ICD-10-CM | POA: Diagnosis not present

## 2013-09-15 DIAGNOSIS — E119 Type 2 diabetes mellitus without complications: Secondary | ICD-10-CM | POA: Diagnosis not present

## 2013-09-15 DIAGNOSIS — I10 Essential (primary) hypertension: Secondary | ICD-10-CM | POA: Diagnosis not present

## 2013-09-16 DIAGNOSIS — I10 Essential (primary) hypertension: Secondary | ICD-10-CM | POA: Diagnosis not present

## 2013-09-16 DIAGNOSIS — E119 Type 2 diabetes mellitus without complications: Secondary | ICD-10-CM | POA: Diagnosis not present

## 2013-09-16 DIAGNOSIS — F0280 Dementia in other diseases classified elsewhere without behavioral disturbance: Secondary | ICD-10-CM | POA: Diagnosis not present

## 2013-09-16 DIAGNOSIS — L02519 Cutaneous abscess of unspecified hand: Secondary | ICD-10-CM | POA: Diagnosis not present

## 2013-09-17 DIAGNOSIS — L02519 Cutaneous abscess of unspecified hand: Secondary | ICD-10-CM | POA: Diagnosis not present

## 2013-09-17 DIAGNOSIS — I10 Essential (primary) hypertension: Secondary | ICD-10-CM | POA: Diagnosis not present

## 2013-09-17 DIAGNOSIS — E119 Type 2 diabetes mellitus without complications: Secondary | ICD-10-CM | POA: Diagnosis not present

## 2013-09-17 DIAGNOSIS — F0280 Dementia in other diseases classified elsewhere without behavioral disturbance: Secondary | ICD-10-CM | POA: Diagnosis not present

## 2013-09-18 DIAGNOSIS — I10 Essential (primary) hypertension: Secondary | ICD-10-CM | POA: Diagnosis not present

## 2013-09-18 DIAGNOSIS — E119 Type 2 diabetes mellitus without complications: Secondary | ICD-10-CM | POA: Diagnosis not present

## 2013-09-18 DIAGNOSIS — L02519 Cutaneous abscess of unspecified hand: Secondary | ICD-10-CM | POA: Diagnosis not present

## 2013-09-18 DIAGNOSIS — F0280 Dementia in other diseases classified elsewhere without behavioral disturbance: Secondary | ICD-10-CM | POA: Diagnosis not present

## 2013-09-19 ENCOUNTER — Other Ambulatory Visit: Payer: Self-pay | Admitting: Family Medicine

## 2013-09-19 DIAGNOSIS — L02519 Cutaneous abscess of unspecified hand: Secondary | ICD-10-CM

## 2013-09-19 DIAGNOSIS — E119 Type 2 diabetes mellitus without complications: Secondary | ICD-10-CM

## 2013-09-19 DIAGNOSIS — L03119 Cellulitis of unspecified part of limb: Secondary | ICD-10-CM

## 2013-09-19 DIAGNOSIS — G309 Alzheimer's disease, unspecified: Secondary | ICD-10-CM

## 2013-09-19 DIAGNOSIS — I10 Essential (primary) hypertension: Secondary | ICD-10-CM

## 2013-09-19 DIAGNOSIS — F0280 Dementia in other diseases classified elsewhere without behavioral disturbance: Secondary | ICD-10-CM

## 2013-09-20 ENCOUNTER — Encounter: Payer: Self-pay | Admitting: Family Medicine

## 2013-09-20 ENCOUNTER — Ambulatory Visit (INDEPENDENT_AMBULATORY_CARE_PROVIDER_SITE_OTHER): Payer: Medicare Other | Admitting: Family Medicine

## 2013-09-20 VITALS — BP 124/78 | Ht 60.0 in

## 2013-09-20 DIAGNOSIS — L0291 Cutaneous abscess, unspecified: Secondary | ICD-10-CM

## 2013-09-20 DIAGNOSIS — F0280 Dementia in other diseases classified elsewhere without behavioral disturbance: Secondary | ICD-10-CM | POA: Diagnosis not present

## 2013-09-20 DIAGNOSIS — L02519 Cutaneous abscess of unspecified hand: Secondary | ICD-10-CM | POA: Diagnosis not present

## 2013-09-20 DIAGNOSIS — E119 Type 2 diabetes mellitus without complications: Secondary | ICD-10-CM | POA: Diagnosis not present

## 2013-09-20 DIAGNOSIS — L853 Xerosis cutis: Secondary | ICD-10-CM

## 2013-09-20 DIAGNOSIS — L738 Other specified follicular disorders: Secondary | ICD-10-CM

## 2013-09-20 DIAGNOSIS — I10 Essential (primary) hypertension: Secondary | ICD-10-CM | POA: Diagnosis not present

## 2013-09-20 NOTE — Progress Notes (Signed)
  Subjective:    Patient ID: [name] [name], female    DOB: 30-Dec-1923, 79 y.o.   MRN: [id number]  Abscess This is a new problem. The current episode started 1 to 4 weeks ago. The problem has been gradually improving. Nothing aggravates the symptoms. Treatments tried: antibiotic. The treatment provided significant relief.  Patient is here to follow up on the abscess that is on her right arm. It is doing a lot better. She still picks at it some.  Patient also has some dry patches on her back that has been present for about 2 days. Family states that the facility does do lotion but not always religiously.  No fevers vomiting. Has underlying dementia  Review of Systems     Objective:   Physical Exam Abscess on the right forearm is healing well dry skin noted especially on her back. Extremities no edema skin warm dry she also has a lesion on the lower leg which appears to be a superficial cancer.       Assessment & Plan:  #1 probable skin cancer on the leg-granddaughter states that the patient has seen Dr. Margo Aye before they we'll set her up for a biopsy. If it needs surgery most likely for surgical center in [place] so they could sedate her #2 abscess healing up no need for further antibiotics #3 dry skin lotion on a regular basis a sooner followup within 3 months

## 2013-09-23 ENCOUNTER — Other Ambulatory Visit: Payer: Self-pay | Admitting: Family Medicine

## 2013-09-23 DIAGNOSIS — F0280 Dementia in other diseases classified elsewhere without behavioral disturbance: Secondary | ICD-10-CM | POA: Diagnosis not present

## 2013-09-23 DIAGNOSIS — L02519 Cutaneous abscess of unspecified hand: Secondary | ICD-10-CM | POA: Diagnosis not present

## 2013-09-23 DIAGNOSIS — I10 Essential (primary) hypertension: Secondary | ICD-10-CM | POA: Diagnosis not present

## 2013-09-23 DIAGNOSIS — E119 Type 2 diabetes mellitus without complications: Secondary | ICD-10-CM | POA: Diagnosis not present

## 2013-09-24 DIAGNOSIS — F0280 Dementia in other diseases classified elsewhere without behavioral disturbance: Secondary | ICD-10-CM | POA: Diagnosis not present

## 2013-09-24 DIAGNOSIS — I10 Essential (primary) hypertension: Secondary | ICD-10-CM | POA: Diagnosis not present

## 2013-09-24 DIAGNOSIS — L02519 Cutaneous abscess of unspecified hand: Secondary | ICD-10-CM | POA: Diagnosis not present

## 2013-09-24 DIAGNOSIS — E119 Type 2 diabetes mellitus without complications: Secondary | ICD-10-CM | POA: Diagnosis not present

## 2013-09-25 DIAGNOSIS — E119 Type 2 diabetes mellitus without complications: Secondary | ICD-10-CM | POA: Diagnosis not present

## 2013-09-25 DIAGNOSIS — L02519 Cutaneous abscess of unspecified hand: Secondary | ICD-10-CM | POA: Diagnosis not present

## 2013-09-25 DIAGNOSIS — I10 Essential (primary) hypertension: Secondary | ICD-10-CM | POA: Diagnosis not present

## 2013-09-25 DIAGNOSIS — F0280 Dementia in other diseases classified elsewhere without behavioral disturbance: Secondary | ICD-10-CM | POA: Diagnosis not present

## 2013-09-26 DIAGNOSIS — L02519 Cutaneous abscess of unspecified hand: Secondary | ICD-10-CM | POA: Diagnosis not present

## 2013-09-26 DIAGNOSIS — I10 Essential (primary) hypertension: Secondary | ICD-10-CM | POA: Diagnosis not present

## 2013-09-26 DIAGNOSIS — F0280 Dementia in other diseases classified elsewhere without behavioral disturbance: Secondary | ICD-10-CM | POA: Diagnosis not present

## 2013-09-26 DIAGNOSIS — E119 Type 2 diabetes mellitus without complications: Secondary | ICD-10-CM | POA: Diagnosis not present

## 2013-09-27 ENCOUNTER — Other Ambulatory Visit: Payer: Self-pay | Admitting: Family Medicine

## 2013-09-27 DIAGNOSIS — L02519 Cutaneous abscess of unspecified hand: Secondary | ICD-10-CM | POA: Diagnosis not present

## 2013-09-27 DIAGNOSIS — E119 Type 2 diabetes mellitus without complications: Secondary | ICD-10-CM | POA: Diagnosis not present

## 2013-09-27 DIAGNOSIS — F0280 Dementia in other diseases classified elsewhere without behavioral disturbance: Secondary | ICD-10-CM | POA: Diagnosis not present

## 2013-09-27 DIAGNOSIS — I10 Essential (primary) hypertension: Secondary | ICD-10-CM | POA: Diagnosis not present

## 2013-09-30 DIAGNOSIS — I10 Essential (primary) hypertension: Secondary | ICD-10-CM | POA: Diagnosis not present

## 2013-09-30 DIAGNOSIS — F0280 Dementia in other diseases classified elsewhere without behavioral disturbance: Secondary | ICD-10-CM | POA: Diagnosis not present

## 2013-09-30 DIAGNOSIS — L02519 Cutaneous abscess of unspecified hand: Secondary | ICD-10-CM | POA: Diagnosis not present

## 2013-09-30 DIAGNOSIS — E119 Type 2 diabetes mellitus without complications: Secondary | ICD-10-CM | POA: Diagnosis not present

## 2013-10-01 DIAGNOSIS — F0280 Dementia in other diseases classified elsewhere without behavioral disturbance: Secondary | ICD-10-CM | POA: Diagnosis not present

## 2013-10-01 DIAGNOSIS — E119 Type 2 diabetes mellitus without complications: Secondary | ICD-10-CM | POA: Diagnosis not present

## 2013-10-01 DIAGNOSIS — I10 Essential (primary) hypertension: Secondary | ICD-10-CM | POA: Diagnosis not present

## 2013-10-01 DIAGNOSIS — L02519 Cutaneous abscess of unspecified hand: Secondary | ICD-10-CM | POA: Diagnosis not present

## 2013-10-03 DIAGNOSIS — L570 Actinic keratosis: Secondary | ICD-10-CM | POA: Diagnosis not present

## 2013-10-03 DIAGNOSIS — D235 Other benign neoplasm of skin of trunk: Secondary | ICD-10-CM | POA: Diagnosis not present

## 2013-10-03 DIAGNOSIS — D0439 Carcinoma in situ of skin of other parts of face: Secondary | ICD-10-CM | POA: Diagnosis not present

## 2013-10-03 DIAGNOSIS — L820 Inflamed seborrheic keratosis: Secondary | ICD-10-CM | POA: Diagnosis not present

## 2013-10-04 ENCOUNTER — Other Ambulatory Visit: Payer: Self-pay | Admitting: Family Medicine

## 2013-10-04 DIAGNOSIS — I10 Essential (primary) hypertension: Secondary | ICD-10-CM | POA: Diagnosis not present

## 2013-10-04 DIAGNOSIS — F0280 Dementia in other diseases classified elsewhere without behavioral disturbance: Secondary | ICD-10-CM | POA: Diagnosis not present

## 2013-10-04 DIAGNOSIS — L02519 Cutaneous abscess of unspecified hand: Secondary | ICD-10-CM | POA: Diagnosis not present

## 2013-10-04 DIAGNOSIS — E119 Type 2 diabetes mellitus without complications: Secondary | ICD-10-CM | POA: Diagnosis not present

## 2013-10-07 ENCOUNTER — Other Ambulatory Visit: Payer: Self-pay | Admitting: Family Medicine

## 2013-10-07 DIAGNOSIS — H01009 Unspecified blepharitis unspecified eye, unspecified eyelid: Secondary | ICD-10-CM | POA: Diagnosis not present

## 2013-10-08 ENCOUNTER — Other Ambulatory Visit: Payer: Self-pay | Admitting: Family Medicine

## 2013-10-09 ENCOUNTER — Other Ambulatory Visit: Payer: Self-pay | Admitting: Family Medicine

## 2013-10-21 ENCOUNTER — Other Ambulatory Visit: Payer: Self-pay | Admitting: Family Medicine

## 2013-10-22 ENCOUNTER — Telehealth: Payer: Self-pay

## 2013-10-22 NOTE — Telephone Encounter (Signed)
Fax from [facility] stated: Can we please get an order for something for anxiety? [name] keeps getting agitated and is climbing out of her bed and we are afraid she is going to fall. Thanks Rosey Bath  Dr. Lorin Picket Luk[place]'s response: I don't recommend due to increase risk of sedation and falls. I recommend family call if they want to discuss further.

## 2013-10-25 ENCOUNTER — Emergency Department (HOSPITAL_COMMUNITY): Payer: Medicare Other

## 2013-10-25 ENCOUNTER — Encounter (HOSPITAL_COMMUNITY): Payer: Self-pay | Admitting: Emergency Medicine

## 2013-10-25 ENCOUNTER — Observation Stay (HOSPITAL_COMMUNITY)
Admission: EM | Admit: 2013-10-25 | Discharge: 2013-10-26 | DRG: 392 | Disposition: A | Discharge status: Nursing Home (Includes ICF) | Payer: Medicare Other | Attending: Internal Medicine | Admitting: Internal Medicine

## 2013-10-25 DIAGNOSIS — H34231 Retinal artery branch occlusion, right eye: Secondary | ICD-10-CM

## 2013-10-25 DIAGNOSIS — Z96659 Presence of unspecified artificial knee joint: Secondary | ICD-10-CM

## 2013-10-25 DIAGNOSIS — R51 Headache: Secondary | ICD-10-CM | POA: Diagnosis not present

## 2013-10-25 DIAGNOSIS — I447 Left bundle-branch block, unspecified: Secondary | ICD-10-CM | POA: Diagnosis not present

## 2013-10-25 DIAGNOSIS — K219 Gastro-esophageal reflux disease without esophagitis: Secondary | ICD-10-CM | POA: Diagnosis present

## 2013-10-25 DIAGNOSIS — E119 Type 2 diabetes mellitus without complications: Secondary | ICD-10-CM | POA: Diagnosis not present

## 2013-10-25 DIAGNOSIS — R112 Nausea with vomiting, unspecified: Secondary | ICD-10-CM | POA: Diagnosis not present

## 2013-10-25 DIAGNOSIS — F0280 Dementia in other diseases classified elsewhere without behavioral disturbance: Secondary | ICD-10-CM | POA: Diagnosis present

## 2013-10-25 DIAGNOSIS — Z90710 Acquired absence of both cervix and uterus: Secondary | ICD-10-CM

## 2013-10-25 DIAGNOSIS — N200 Calculus of kidney: Secondary | ICD-10-CM

## 2013-10-25 DIAGNOSIS — R296 Repeated falls: Secondary | ICD-10-CM

## 2013-10-25 DIAGNOSIS — I2510 Atherosclerotic heart disease of native coronary artery without angina pectoris: Secondary | ICD-10-CM | POA: Diagnosis not present

## 2013-10-25 DIAGNOSIS — G250 Essential tremor: Secondary | ICD-10-CM

## 2013-10-25 DIAGNOSIS — I129 Hypertensive chronic kidney disease with stage 1 through stage 4 chronic kidney disease, or unspecified chronic kidney disease: Secondary | ICD-10-CM | POA: Diagnosis not present

## 2013-10-25 DIAGNOSIS — M1990 Unspecified osteoarthritis, unspecified site: Secondary | ICD-10-CM | POA: Diagnosis present

## 2013-10-25 DIAGNOSIS — Z7982 Long term (current) use of aspirin: Secondary | ICD-10-CM

## 2013-10-25 DIAGNOSIS — Z66 Do not resuscitate: Secondary | ICD-10-CM | POA: Diagnosis not present

## 2013-10-25 DIAGNOSIS — K449 Diaphragmatic hernia without obstruction or gangrene: Secondary | ICD-10-CM | POA: Diagnosis not present

## 2013-10-25 DIAGNOSIS — D72829 Elevated white blood cell count, unspecified: Secondary | ICD-10-CM | POA: Diagnosis not present

## 2013-10-25 DIAGNOSIS — R197 Diarrhea, unspecified: Secondary | ICD-10-CM | POA: Diagnosis not present

## 2013-10-25 DIAGNOSIS — Z833 Family history of diabetes mellitus: Secondary | ICD-10-CM | POA: Diagnosis not present

## 2013-10-25 DIAGNOSIS — Z79899 Other long term (current) drug therapy: Secondary | ICD-10-CM | POA: Diagnosis not present

## 2013-10-25 DIAGNOSIS — L853 Xerosis cutis: Secondary | ICD-10-CM

## 2013-10-25 DIAGNOSIS — N183 Chronic kidney disease, stage 3 unspecified: Secondary | ICD-10-CM

## 2013-10-25 DIAGNOSIS — Z8249 Family history of ischemic heart disease and other diseases of the circulatory system: Secondary | ICD-10-CM | POA: Diagnosis not present

## 2013-10-25 DIAGNOSIS — A088 Other specified intestinal infections: Principal | ICD-10-CM | POA: Diagnosis present

## 2013-10-25 DIAGNOSIS — H5440 Blindness, one eye, unspecified eye: Secondary | ICD-10-CM | POA: Diagnosis present

## 2013-10-25 DIAGNOSIS — E860 Dehydration: Secondary | ICD-10-CM | POA: Diagnosis not present

## 2013-10-25 DIAGNOSIS — Z9181 History of falling: Secondary | ICD-10-CM | POA: Diagnosis not present

## 2013-10-25 DIAGNOSIS — R404 Transient alteration of awareness: Secondary | ICD-10-CM | POA: Diagnosis not present

## 2013-10-25 DIAGNOSIS — R918 Other nonspecific abnormal finding of lung field: Secondary | ICD-10-CM | POA: Diagnosis not present

## 2013-10-25 DIAGNOSIS — G20A1 Parkinson's disease without dyskinesia, without mention of fluctuations: Secondary | ICD-10-CM | POA: Diagnosis present

## 2013-10-25 DIAGNOSIS — I629 Nontraumatic intracranial hemorrhage, unspecified: Secondary | ICD-10-CM

## 2013-10-25 DIAGNOSIS — G20 Parkinson's disease: Secondary | ICD-10-CM | POA: Diagnosis not present

## 2013-10-25 DIAGNOSIS — F0390 Unspecified dementia without behavioral disturbance: Secondary | ICD-10-CM | POA: Diagnosis not present

## 2013-10-25 DIAGNOSIS — R110 Nausea: Secondary | ICD-10-CM | POA: Diagnosis not present

## 2013-10-25 DIAGNOSIS — I10 Essential (primary) hypertension: Secondary | ICD-10-CM

## 2013-10-25 DIAGNOSIS — E785 Hyperlipidemia, unspecified: Secondary | ICD-10-CM | POA: Diagnosis not present

## 2013-10-25 DIAGNOSIS — M810 Age-related osteoporosis without current pathological fracture: Secondary | ICD-10-CM | POA: Diagnosis not present

## 2013-10-25 DIAGNOSIS — G309 Alzheimer's disease, unspecified: Secondary | ICD-10-CM | POA: Diagnosis present

## 2013-10-25 DIAGNOSIS — R5381 Other malaise: Secondary | ICD-10-CM | POA: Diagnosis not present

## 2013-10-25 HISTORY — DX: Diaphragmatic hernia without obstruction or gangrene: K44.9

## 2013-10-25 HISTORY — DX: Diverticulosis of intestine, part unspecified, without perforation or abscess without bleeding: K57.90

## 2013-10-25 LAB — CBC WITH DIFFERENTIAL/PLATELET
Basophils Absolute: 0 10*3/uL (ref 0.0–0.1)
Lymphocytes Relative: 5 % — ABNORMAL LOW (ref 12–46)
Neutro Abs: 19.2 10*3/uL — ABNORMAL HIGH (ref 1.7–7.7)
Platelets: 268 10*3/uL (ref 150–400)
RDW: 13.2 % (ref 11.5–15.5)
WBC: 22.2 10*3/uL — ABNORMAL HIGH (ref 4.0–10.5)

## 2013-10-25 LAB — COMPREHENSIVE METABOLIC PANEL
AST: 21 U/L (ref 0–37)
Albumin: 4.1 g/dL (ref 3.5–5.2)
BUN: 28 mg/dL — ABNORMAL HIGH (ref 6–23)
CO2: 26 mEq/L (ref 19–32)
Calcium: 9.6 mg/dL (ref 8.4–10.5)
Chloride: 100 mEq/L (ref 96–112)
Creatinine, Ser: 1.15 mg/dL — ABNORMAL HIGH (ref 0.50–1.10)
GFR calc Af Amer: 48 mL/min — ABNORMAL LOW (ref >90)
GFR calc non Af Amer: 42 mL/min — ABNORMAL LOW (ref >90)
Glucose, Bld: 117 mg/dL — ABNORMAL HIGH (ref 70–99)
Sodium: 137 mEq/L (ref 135–145)
Total Bilirubin: 0.2 mg/dL — ABNORMAL LOW (ref 0.3–1.2)
Total Protein: 8.2 g/dL (ref 6.0–8.3)

## 2013-10-25 LAB — GLUCOSE, CAPILLARY: Glucose-Capillary: 124 mg/dL — ABNORMAL HIGH (ref 70–99)

## 2013-10-25 LAB — URINALYSIS W MICROSCOPIC + REFLEX CULTURE
Bilirubin Urine: NEGATIVE
Glucose, UA: NEGATIVE mg/dL
Ketones, ur: NEGATIVE mg/dL
Leukocytes, UA: NEGATIVE
Nitrite: NEGATIVE
Urobilinogen, UA: 0.2 mg/dL (ref 0.0–1.0)
pH: 5 (ref 5.0–8.0)

## 2013-10-25 LAB — LIPASE, BLOOD: Lipase: 63 U/L — ABNORMAL HIGH (ref 11–59)

## 2013-10-25 LAB — LACTIC ACID, PLASMA: Lactic Acid, Venous: 1.2 mmol/L (ref 0.5–2.2)

## 2013-10-25 LAB — TROPONIN I: Troponin I: <0.3 ng/mL (ref <0.30)

## 2013-10-25 MED ORDER — TOBRAMYCIN-DEXAMETHASONE 0.3-0.1 % OP SUSP
1.0000 [drp] | Freq: Four times a day (QID) | OPHTHALMIC | Status: DC
  Administered 2013-10-26: 1 [drp] via OPHTHALMIC
  Filled 2013-10-25: qty 2.5

## 2013-10-25 MED ORDER — FENTANYL CITRATE 0.05 MG/ML IJ SOLN
12.5000 ug | INTRAMUSCULAR | Status: DC | PRN
  Administered 2013-10-25: 12.5 ug via INTRAVENOUS
  Filled 2013-10-25: qty 2

## 2013-10-25 MED ORDER — DONEPEZIL HCL 5 MG PO TABS
5.0000 mg | ORAL_TABLET | Freq: Every morning | ORAL | Status: DC
  Administered 2013-10-26: 5 mg via ORAL
  Filled 2013-10-25: qty 1

## 2013-10-25 MED ORDER — ASPIRIN EC 81 MG PO TBEC
81.0000 mg | DELAYED_RELEASE_TABLET | Freq: Every day | ORAL | Status: DC
  Administered 2013-10-26: 81 mg via ORAL
  Filled 2013-10-25: qty 1

## 2013-10-25 MED ORDER — SODIUM CHLORIDE 0.9 % IV SOLN
INTRAVENOUS | Status: DC
  Administered 2013-10-25: 16:00:00 via INTRAVENOUS

## 2013-10-25 MED ORDER — IOHEXOL 300 MG/ML  SOLN: 80.0000 mL | Freq: Once | INTRAMUSCULAR | Status: DC | PRN

## 2013-10-25 MED ORDER — PANTOPRAZOLE SODIUM 40 MG PO TBEC
80.0000 mg | DELAYED_RELEASE_TABLET | Freq: Every day | ORAL | Status: DC
  Administered 2013-10-26: 80 mg via ORAL
  Filled 2013-10-25: qty 2

## 2013-10-25 MED ORDER — PRIMIDONE 50 MG PO TABS
25.0000 mg | ORAL_TABLET | Freq: Every day | ORAL | Status: DC
  Administered 2013-10-26: 25 mg via ORAL
  Filled 2013-10-25 (×3): qty 0.5

## 2013-10-25 MED ORDER — ACETAMINOPHEN 650 MG RE SUPP: 650.0000 mg | Freq: Four times a day (QID) | RECTAL | Status: DC | PRN

## 2013-10-25 MED ORDER — LORATADINE 10 MG PO TABS
10.0000 mg | ORAL_TABLET | Freq: Every day | ORAL | Status: DC
  Administered 2013-10-26: 10 mg via ORAL
  Filled 2013-10-25: qty 1

## 2013-10-25 MED ORDER — POTASSIUM CHLORIDE CRYS ER 10 MEQ PO TBCR
10.0000 meq | EXTENDED_RELEASE_TABLET | Freq: Every day | ORAL | Status: DC
  Administered 2013-10-26: 10 meq via ORAL
  Filled 2013-10-25: qty 1

## 2013-10-25 MED ORDER — ACETAMINOPHEN 325 MG PO TABS: 650.0000 mg | ORAL_TABLET | Freq: Four times a day (QID) | ORAL | Status: DC | PRN

## 2013-10-25 MED ORDER — ENOXAPARIN SODIUM 40 MG/0.4ML ~~LOC~~ SOLN
40.0000 mg | SUBCUTANEOUS | Status: DC
  Administered 2013-10-25: 40 mg via SUBCUTANEOUS
  Filled 2013-10-25: qty 0.4

## 2013-10-25 MED ORDER — VITAMIN E 180 MG (400 UNIT) PO CAPS
400.0000 [IU] | ORAL_CAPSULE | Freq: Every day | ORAL | Status: DC
  Administered 2013-10-26: 400 [IU] via ORAL
  Filled 2013-10-25 (×3): qty 1

## 2013-10-25 MED ORDER — SUCRALFATE 1 G PO TABS
1.0000 g | ORAL_TABLET | Freq: Four times a day (QID) | ORAL | Status: DC
  Administered 2013-10-25 – 2013-10-26 (×2): 1 g via ORAL
  Filled 2013-10-25 (×3): qty 1

## 2013-10-25 MED ORDER — ATROPINE SULFATE 1 % OP SOLN
1.0000 [drp] | Freq: Two times a day (BID) | OPHTHALMIC | Status: DC
  Administered 2013-10-26: 1 [drp] via OPHTHALMIC
  Filled 2013-10-25: qty 2

## 2013-10-25 MED ORDER — MEMANTINE HCL 10 MG PO TABS
10.0000 mg | ORAL_TABLET | Freq: Two times a day (BID) | ORAL | Status: DC
  Administered 2013-10-25 – 2013-10-26 (×2): 10 mg via ORAL
  Filled 2013-10-25 (×4): qty 1

## 2013-10-25 MED ORDER — POLYETHYLENE GLYCOL 3350 17 G PO PACK
17.0000 g | PACK | Freq: Every day | ORAL | Status: DC
  Administered 2013-10-26: 17 g via ORAL
  Filled 2013-10-25: qty 1

## 2013-10-25 MED ORDER — ONDANSETRON HCL 4 MG/2ML IJ SOLN
4.0000 mg | INTRAMUSCULAR | Status: DC | PRN
  Administered 2013-10-25: 4 mg via INTRAVENOUS
  Filled 2013-10-25: qty 2

## 2013-10-25 MED ORDER — METHYLCELLULOSE 1 % OP SOLN: 1.0000 [drp] | Freq: Three times a day (TID) | OPHTHALMIC | Status: DC

## 2013-10-25 MED ORDER — INSULIN ASPART 100 UNIT/ML ~~LOC~~ SOLN
3.0000 [IU] | Freq: Three times a day (TID) | SUBCUTANEOUS | Status: DC
  Administered 2013-10-26: 3 [IU] via SUBCUTANEOUS

## 2013-10-25 MED ORDER — IOHEXOL 300 MG/ML  SOLN
50.0000 mL | Freq: Once | INTRAMUSCULAR | Status: AC | PRN
  Administered 2013-10-25: 50 mL via ORAL

## 2013-10-25 MED ORDER — PRIMIDONE 50 MG PO TABS
ORAL_TABLET | ORAL | Status: AC
  Filled 2013-10-25: qty 1

## 2013-10-25 MED ORDER — SODIUM CHLORIDE 0.9 % IV SOLN: INTRAVENOUS | Status: AC

## 2013-10-25 MED ORDER — POLYVINYL ALCOHOL 1.4 % OP SOLN
OPHTHALMIC | Status: AC
  Filled 2013-10-25: qty 15

## 2013-10-25 MED ORDER — POLYVINYL ALCOHOL 1.4 % OP SOLN: 1.0000 [drp] | Freq: Three times a day (TID) | OPHTHALMIC | Status: DC | PRN

## 2013-10-25 MED ORDER — ONDANSETRON HCL 4 MG/2ML IJ SOLN: 4.0000 mg | Freq: Four times a day (QID) | INTRAMUSCULAR | Status: DC | PRN

## 2013-10-25 MED ORDER — ONDANSETRON HCL 4 MG/2ML IJ SOLN: 4.0000 mg | Freq: Three times a day (TID) | INTRAMUSCULAR | Status: AC | PRN

## 2013-10-25 MED ORDER — SODIUM CHLORIDE 0.9 % IV SOLN
INTRAVENOUS | Status: DC
  Administered 2013-10-25: 21:00:00 via INTRAVENOUS

## 2013-10-25 MED ORDER — CITALOPRAM HYDROBROMIDE 20 MG PO TABS
20.0000 mg | ORAL_TABLET | Freq: Every day | ORAL | Status: DC
  Administered 2013-10-26: 20 mg via ORAL
  Filled 2013-10-25: qty 1

## 2013-10-25 MED ORDER — INSULIN ASPART 100 UNIT/ML ~~LOC~~ SOLN: 0.0000 [IU] | Freq: Three times a day (TID) | SUBCUTANEOUS | Status: DC

## 2013-10-25 MED ORDER — B COMPLEX-C PO TABS
1.0000 | ORAL_TABLET | Freq: Every day | ORAL | Status: DC
  Administered 2013-10-26: 1 via ORAL
  Filled 2013-10-25 (×3): qty 1

## 2013-10-25 MED ORDER — ONDANSETRON HCL 4 MG PO TABS: 4.0000 mg | ORAL_TABLET | Freq: Four times a day (QID) | ORAL | Status: DC | PRN

## 2013-10-25 NOTE — H&P (Signed)
[facility]          History and Physical    PCP:   Lilyan Punt, MD   Chief Complaint:  Nausea/vomiting/diarrhea/neck pain  HPI: Patient is an 79 year old white woman resident of the Alzheimer's unit at [facility] who has a past medical history significant for Parkinson's disease frequent falls and dementia as well as hypertension, hyperlipidemia and diabetes. Patient is unable to provide history and hence history is provided by patient's granddaughter who is at bedside at time of my examination. Granddaughter states that about 3 days ago patient had profuse nausea/vomiting/diarrhea that lasted about 48 hours and spontaneously resolved. She states that several residents in the memory care unit have also had the same symptoms. Today patient again experienced nausea and vomiting and the facility got concerned when she complained of neck pain bringing her into the hospital for evaluation. In the emergency department workup is completely negative with the exception of a white blood cell count of 22,000. CT scan of the abdomen and pelvis is negative for acute intra-abdominal pathology. We have been asked to admit her for further evaluation and management.  Allergies:   Allergies  Allergen Reactions  . Codeine Other (See Comments)    REACTION: Unknown  . Morphine Nausea And Vomiting      Past Medical History  Diagnosis Date  . ASCVD (arteriosclerotic cardiovascular disease) 2003    Single vessel disease-60% D1  . Hypertension   . Hyperlipidemia   . Diabetes mellitus     No insulin  . Leukopenia     and anemia-hematology evaluation was nodiagnostic  . Degenerative joint disease     right TKA in 4/06  . Upper GI bleed     gastric stress ulcer in 2003  . Retinal artery branch occlusion of right eye     Sudden onset of right eye blindness-embolism suspected  . Nephrolithiasis   . Gastroesophageal reflux disease with hiatal hernia   . Osteoporosis   . Lung  nodule     Left lower lobe  . Parkinson's disease   . Dementia   . Cervical spondylarthritis     S/p discectomy  . Alzheimer disease   . Diverticulosis   . Hiatal hernia     Past Surgical History  Procedure Laterality Date  . Total abdominal hysterectomy    . Cervical discectomy      Dr. Newell Coral  . Cholecystectomy    . Total knee arthroplasty  2006    Right; Dr. Eulah Pont  . Colonoscopy  2009  . Joint replacement    . Vascular surgery    . Cystoscopy w/ ureteral stent placement  09/23/2011    Procedure: CYSTOSCOPY WITH RETROGRADE PYELOGRAM/URETERAL STENT PLACEMENT;  Surgeon: Ky Barban;  Location: [facility];  Service: Urology;  Laterality: Right;    Prior to Admission medications   Medication Sig Start Date End Date Taking? Authorizing Provider  acetaminophen (Q-PAP) 500 MG tablet Take 500 mg by mouth 2 (two) times daily. *May also take one tablet by mouth once daily as needed for headache   Yes Historical Provider, MD  Albuterol Sulfate (PROAIR HFA IN) Inhale into the lungs. 2 puffs every 4 hours as needed   Yes Historical Provider, MD  amoxicillin (AMOXIL) 250 MG capsule Take 250 mg by mouth at bedtime.   Yes Historical Provider, MD  aspirin EC 81 MG tablet Take 81 mg by mouth daily.   Yes Historical Provider, MD  atropine 1 % ophthalmic solution Place 1 drop  into the right eye 2 (two) times daily.    Yes Historical Provider, MD  citalopram (CELEXA) 20 MG tablet Take 20 mg by mouth daily.   Yes Historical Provider, MD  dextrose (GLUTOSE) 40 % GEL Take 1 Tube by mouth once as needed for low blood sugar (for sugar levels less than 60).   Yes Historical Provider, MD  Difluprednate (DUREZOL) 0.05 % EMUL Place 1 drop into the right eye daily.   Yes Historical Provider, MD  donepezil (ARICEPT) 5 MG tablet Take 5 mg by mouth every morning.   Yes Historical Provider, MD  furosemide (LASIX) 20 MG tablet Take 20 mg by mouth daily.   Yes Historical Provider, MD   HYDROcodone-acetaminophen (NORCO/VICODIN) 5-325 MG per tablet Take 1 tablet by mouth every 6 (six) hours as needed for moderate pain.  09/13/13  Yes Historical Provider, MD  loratadine (CLARITIN) 10 MG tablet Take 10 mg by mouth daily.   Yes Historical Provider, MD  losartan (COZAAR) 25 MG tablet Take 25 mg by mouth daily.   Yes Historical Provider, MD  memantine (NAMENDA) 10 MG tablet Take 10 mg by mouth 2 (two) times daily.   Yes Historical Provider, MD  methylcellulose (ARTIFICIAL TEARS) 1 % ophthalmic solution Place 1 drop into both eyes 3 (three) times daily.    Yes Historical Provider, MD  mometasone (ELOCON) 0.1 % cream Apply 1 application topically 2 (two) times daily as needed (for irritation).   Yes Historical Provider, MD  omeprazole (PRILOSEC) 40 MG capsule Take 40 mg by mouth daily.   Yes Historical Provider, MD  polyethylene glycol (MIRALAX / GLYCOLAX) packet Take 17 g by mouth daily. *Mixed with 8 ounces of water/juice daily*   Yes Historical Provider, MD  polyvinyl alcohol (ARTIFICIAL TEARS) 1.4 % ophthalmic solution Place 1 drop into both eyes every 8 (eight) hours as needed for dry eyes.   Yes Historical Provider, MD  potassium chloride (K-DUR,KLOR-CON) 10 MEQ tablet Take 10 mEq by mouth daily.   Yes Historical Provider, MD  primidone (MYSOLINE) 50 MG tablet Take 0.5 tablets (25 mg total) by mouth at bedtime. 07/04/13  Yes Huston Foley, MD  sucralfate (CARAFATE) 1 G tablet Take 1 g by mouth 4 (four) times daily. *to be crushed in small amount of liquid*   Yes Historical Provider, MD  tobramycin-dexamethasone (TOBRADEX) ophthalmic solution Place 1 drop into the right eye 4 (four) times daily.   Yes Historical Provider, MD  Vitamin B Complex-C CAPS Take 1 capsule by mouth daily. 02/11/13  Yes Babs Sciara, MD  vitamin E 400 UNIT capsule Take 400 Units by mouth daily.   Yes Historical Provider, MD  clotrimazole-betamethasone (LOTRISONE) cream Apply 1 application topically daily as  needed.  08/23/13   Historical Provider, MD  ondansetron (ZOFRAN-ODT) 4 MG disintegrating tablet Take 4 mg by mouth. Take one every 6 hours as needed for nausea    Historical Provider, MD    Social History:  reports that she has never smoked. She has never used smokeless tobacco. She reports that she does not drink alcohol or use illicit drugs.  Family History  Problem Relation Age of Onset  . Diabetes Sister   . Heart disease Sister   . Diabetes Brother     Review of Systems:  Unable to obtain given severe dementia  Physical Exam: Blood pressure 133/50, pulse 68, temperature 98.2 F (36.8 C), temperature source Oral, resp. rate 18, SpO2 98.00%. General: Awake, mumbles nonsensically. HEENT: Normocephalic, atraumatic, chronic  right eye ptosis per granddaughter, dry mucous membranes. Neck: Supple, no JVD, no lymphadenopathy, no bruits, no goiter. Cardiovascular: Regular rate and rhythm, no murmurs, rubs or gallops. Lungs: Clear to auscultation bilaterally. Abdomen: Soft, nontender, nondistended, positive bowel sounds. Extremities: 1-2+ pitting edema bilaterally symmetrically. Neurologic: Moves all 4 spontaneously.  Labs on Admission:  Results for orders placed during the hospital encounter of 10/25/13 (from the past 48 hour(s))  COMPREHENSIVE METABOLIC PANEL     Status: Abnormal   Collection Time    10/25/13  1:58 PM      Result Value Range   Sodium 137  135 - 145 mEq/L   Potassium 4.2  3.5 - 5.1 mEq/L   Chloride 100  96 - 112 mEq/L   CO2 26  19 - 32 mEq/L   Glucose, Bld 117 (*) 70 - 99 mg/dL   BUN 28 (*) 6 - 23 mg/dL   Creatinine, Ser 1.30 (*) 0.50 - 1.10 mg/dL   Calcium 9.6  8.4 - 86.5 mg/dL   Total Protein 8.2  6.0 - 8.3 g/dL   Albumin 4.1  3.5 - 5.2 g/dL   AST 21  0 - 37 U/L   ALT 24  0 - 35 U/L   Alkaline Phosphatase 50  39 - 117 U/L   Total Bilirubin 0.2 (*) 0.3 - 1.2 mg/dL   GFR calc non Af Amer 42 (*) >90 mL/min   GFR calc Af Amer 48 (*) >90 mL/min   Comment:  (NOTE)     The eGFR has been calculated using the CKD EPI equation.     This calculation has not been validated in all clinical situations.     eGFR's persistently <90 mL/min signify possible Chronic Kidney     Disease.  LIPASE, BLOOD     Status: Abnormal   Collection Time    10/25/13  1:58 PM      Result Value Range   Lipase 63 (*) 11 - 59 U/L  TROPONIN I     Status: None   Collection Time    10/25/13  1:58 PM      Result Value Range   Troponin I <0.30  <0.30 ng/mL   Comment:            Due to the release kinetics of cTnI,     a negative result within the first hours     of the onset of symptoms does not rule out     myocardial infarction with certainty.     If myocardial infarction is still suspected,     repeat the test at appropriate intervals.  CBC WITH DIFFERENTIAL     Status: Abnormal   Collection Time    10/25/13  2:14 PM      Result Value Range   WBC 22.2 (*) 4.0 - 10.5 K/uL   RBC 3.69 (*) 3.87 - 5.11 MIL/uL   Hemoglobin 12.4  12.0 - 15.0 g/dL   HCT 78.4  69.6 - 29.5 %   MCV 102.4 (*) 78.0 - 100.0 fL   MCH 33.6  26.0 - 34.0 pg   MCHC 32.8  30.0 - 36.0 g/dL   RDW 28.4  13.2 - 44.0 %   Platelets 268  150 - 400 K/uL   Neutrophils Relative % 87 (*) 43 - 77 %   Neutro Abs 19.2 (*) 1.7 - 7.7 K/uL   Lymphocytes Relative 5 (*) 12 - 46 %   Lymphs Abs 1.1  0.7 - 4.0 K/uL  Monocytes Relative 8  3 - 12 %   Monocytes Absolute 1.8 (*) 0.1 - 1.0 K/uL   Eosinophils Relative 0  0 - 5 %   Eosinophils Absolute 0.1  0.0 - 0.7 K/uL   Basophils Relative 0  0 - 1 %   Basophils Absolute 0.0  0.0 - 0.1 K/uL  LACTIC ACID, PLASMA     Status: None   Collection Time    10/25/13  2:14 PM      Result Value Range   Lactic Acid, Venous 1.2  0.5 - 2.2 mmol/L  URINALYSIS W MICROSCOPIC + REFLEX CULTURE     Status: Abnormal   Collection Time    10/25/13  4:02 PM      Result Value Range   Color, Urine YELLOW  YELLOW   APPearance CLEAR  CLEAR   Specific Gravity, Urine >1.030 (*) 1.005 -  1.030   pH 5.0  5.0 - 8.0   Glucose, UA NEGATIVE  NEGATIVE mg/dL   Hgb urine dipstick MODERATE (*) NEGATIVE   Bilirubin Urine NEGATIVE  NEGATIVE   Ketones, ur NEGATIVE  NEGATIVE mg/dL   Protein, ur TRACE (*) NEGATIVE mg/dL   Urobilinogen, UA 0.2  0.0 - 1.0 mg/dL   Nitrite NEGATIVE  NEGATIVE   Leukocytes, UA NEGATIVE  NEGATIVE   WBC, UA 0-2  <3 WBC/hpf   RBC / HPF 11-20  <3 RBC/hpf   Bacteria, UA FEW (*) RARE   Squamous Epithelial / LPF FEW (*) RARE    Radiological Exams on Admission: Ct Abdomen Pelvis Wo Contrast  10/25/2013   CLINICAL DATA:  Nausea, vomiting, abdominal pain  EXAM: CT ABDOMEN AND PELVIS WITHOUT CONTRAST  TECHNIQUE: Multidetector CT imaging of the abdomen and pelvis was performed following the standard protocol without intravenous contrast.  COMPARISON:  11/23/2011  FINDINGS: Sagittal images of the spine shows diffuse osteopenia. Stable compression fracture of L1 vertebral body. Degenerative changes lumbar spine again noted.  Lung bases are unremarkable. Moderate size hiatal hernia again noted. Oral contrast material was given to the patient.  Unenhanced liver shows no biliary ductal dilatation. The patient is status postcholecystectomy. Fatty replaced pancreas again noted.  The spleen and adrenal glands are unremarkable. Bilateral renal cortical thinning. Again noted complex cyst within right kidney with peripheral calcifications measures 4.3 by 3.8 cm without significant change in size in appearance from prior exam. Nonobstructive punctate calculus in upper pole of the right kidney measures 3 mm.  There is nonobstructive calculus in left renal pelvis measures 1.1 cm. No hydronephrosis or hydroureter. No calcified ureteral calculi are noted bilaterally. Bilateral distal ureter is unremarkable. Atherosclerotic calcifications of abdominal aorta and iliac arteries.  No small bowel obstruction. Unremarkable terminal ileum. No pericecal inflammation. Scattered diverticula are noted left  colon. Multiple sigmoid colon diverticula are noted. No evidence of acute diverticulitis. No calcified calculi are noted within urinary bladder.  Patient is status post hysterectomy.  IMPRESSION: 1. Again noted moderate size hiatal hernia stable from prior exam. 2. Status postcholecystectomy. Fatty replacement of the pancreas again noted. 3. No hydronephrosis or hydroureter. Stable complex cyst within right kidney. There is nonobstructive calcified calculus in left renal pelvis measures 1.1 cm. No calcified ureteral calculi are identified. 4. Sigmoid colon diverticula.  No evidence of acute diverticulitis. 5. No pericecal inflammation.   Electronically Signed   By: Natasha Mead M.D.   On: [id number] 17:07   Dg Chest 1 View  10/25/2013   CLINICAL DATA:  Vomiting, abdominal pain, diarrhea, weakness.  EXAM: CHEST - 1 VIEW  COMPARISON:  01/24/2012.  FINDINGS: The cardiac silhouette remains borderline enlarged. Interval visualization of a moderate-sized hiatal hernia. Clear lungs. Cervical spine fixation hardware.  IMPRESSION: No acute abnormality.  Moderate-sized hiatal hernia.   Electronically Signed   By: Gordan Payment M.D.   On: 101-Apr-202014 17:12    Assessment/Plan Principal Problem:   Nausea vomiting and diarrhea Active Problems:   PARKINSON'S DISEASE   HYPERTENSION   Type II diabetes mellitus   Senile dementia   Leukocytosis   CKD (chronic kidney disease) stage 3, GFR 30-59 ml/min    Nausea/vomiting/diarrhea -Suspect related to acute viral gastroenteritis, especially given close contacts with same symptoms. -Will admit as an observation for IV fluids. -See no reason for prescribing antibiotics at this point. -She has been on antibiotics recently so as a precaution we'll order a C. difficile PCR.  Leukocytosis -Presumed related to acute viral gastroenteritis. -Follow WBC curve.  Diabetes mellitus -Sensitive sliding scale insulin while in the hospital.  Hypertension -Given moderately low  blood pressures, and dehydration with ongoing GI losses will hold BP meds at this point.  Chronic kidney disease stage III -Appears to be at baseline.  DVT prophylaxis -Lovenox.  CODE STATUS -DO NOT RESUSCITATE     Time Spent on Admission: 75 minutes  HERNANDEZ ACOSTA,ESTELA [facility] Pager: [phone] 10/25/2013, 6:28 PM

## 2013-10-25 NOTE — ED Notes (Signed)
Pt from [facility]. Staff states headache, neck pain, vomiting and diarrhea. Vomited x 1. Pts roommate had a virus last week. Staff became concerned when pt developed a headache. NAD at this time. RA O2 was 90% per EMS. CBG was 123

## 2013-10-25 NOTE — ED Provider Notes (Signed)
CSN: [id number]     Arrival date & time 10/25/13  1328 History   First MD Initiated Contact with Patient 10/25/13 1336     Chief Complaint  Patient presents with  . Emesis  . Diarrhea  . Abdominal Pain    Patient is a 79 y.o. female presenting with vomiting, diarrhea, and abdominal pain. The history is provided by the patient, the EMS personnel and the nursing home. The history is limited by the condition of the patient (Hx dementia).  Emesis Associated symptoms: abdominal pain and diarrhea   Diarrhea Associated symptoms: abdominal pain and vomiting   Abdominal Pain Associated symptoms: diarrhea and vomiting   Pt was seen at 1345. Per EMS, NH report and pt, c/o gradual onset and persistence of several episodes of N/V/D that began several days ago.  Pt told NH staff today she had "neck pain" but upon arrival to the ED denied this and c/o abd pain. NH states pt's roommate had "a GI virus last week."  Pt has significant hx of dementia.   Past Medical History  Diagnosis Date  . ASCVD (arteriosclerotic cardiovascular disease) 2003    Single vessel disease-60% D1  . Hypertension   . Hyperlipidemia   . Diabetes mellitus     No insulin  . Leukopenia     and anemia-hematology evaluation was nodiagnostic  . Degenerative joint disease     right TKA in 4/06  . Upper GI bleed     gastric stress ulcer in 2003  . Retinal artery branch occlusion of right eye     Sudden onset of right eye blindness-embolism suspected  . Nephrolithiasis   . Gastroesophageal reflux disease with hiatal hernia   . Osteoporosis   . Lung nodule     Left lower lobe  . Parkinson's disease   . Dementia   . Cervical spondylarthritis     S/p discectomy  . Alzheimer disease   . Diverticulosis    Past Surgical History  Procedure Laterality Date  . Total abdominal hysterectomy    . Cervical discectomy      Dr. Newell Coral  . Cholecystectomy    . Total knee arthroplasty  2006    Right; Dr. Eulah Pont  . Colonoscopy   2009  . Joint replacement    . Vascular surgery    . Cystoscopy w/ ureteral stent placement  09/23/2011    Procedure: CYSTOSCOPY WITH RETROGRADE PYELOGRAM/URETERAL STENT PLACEMENT;  Surgeon: Ky Barban;  Location: [facility];  Service: Urology;  Laterality: Right;   Family History  Problem Relation Age of Onset  . Diabetes Sister   . Heart disease Sister   . Diabetes Brother    History  Substance Use Topics  . Smoking status: Never Smoker   . Smokeless tobacco: Never Used  . Alcohol Use: No    Review of Systems  Unable to perform ROS: Dementia  Gastrointestinal: Positive for vomiting, abdominal pain and diarrhea.    Allergies  Codeine and Morphine  Home Medications   Current Outpatient Rx  Name  Route  Sig  Dispense  Refill  . acetaminophen (Q-PAP) 500 MG tablet   Oral   Take 500 mg by mouth 2 (two) times daily. *May also take one tablet by mouth once daily as needed for headache         . Albuterol Sulfate (PROAIR HFA IN)   Inhalation   Inhale into the lungs. 2 puffs every 4 hours as needed         .  amoxicillin (AMOXIL) 250 MG capsule   Oral   Take 250 mg by mouth at bedtime.         Marland Kitchen aspirin EC 81 MG tablet   Oral   Take 81 mg by mouth daily.         Marland Kitchen atropine 1 % ophthalmic solution   Right Eye   Place 1 drop into the right eye 2 (two) times daily.          . citalopram (CELEXA) 20 MG tablet   Oral   Take 20 mg by mouth daily.         Marland Kitchen dextrose (GLUTOSE) 40 % GEL   Oral   Take 1 Tube by mouth once as needed for low blood sugar (for sugar levels less than 60).         . Difluprednate (DUREZOL) 0.05 % EMUL   Right Eye   Place 1 drop into the right eye daily.         Marland Kitchen donepezil (ARICEPT) 5 MG tablet   Oral   Take 5 mg by mouth every morning.         . furosemide (LASIX) 20 MG tablet   Oral   Take 20 mg by mouth daily.         Marland Kitchen HYDROcodone-acetaminophen (NORCO/VICODIN) 5-325 MG per tablet   Oral   Take 1 tablet by  mouth every 6 (six) hours as needed for moderate pain.          Marland Kitchen loratadine (CLARITIN) 10 MG tablet   Oral   Take 10 mg by mouth daily.         Marland Kitchen losartan (COZAAR) 25 MG tablet   Oral   Take 25 mg by mouth daily.         . memantine (NAMENDA) 10 MG tablet   Oral   Take 10 mg by mouth 2 (two) times daily.         . methylcellulose (ARTIFICIAL TEARS) 1 % ophthalmic solution   Both Eyes   Place 1 drop into both eyes 3 (three) times daily.          . mometasone (ELOCON) 0.1 % cream   Topical   Apply 1 application topically 2 (two) times daily as needed (for irritation).         Marland Kitchen omeprazole (PRILOSEC) 40 MG capsule   Oral   Take 40 mg by mouth daily.         . polyethylene glycol (MIRALAX / GLYCOLAX) packet   Oral   Take 17 g by mouth daily. *Mixed with 8 ounces of water/juice daily*         . polyvinyl alcohol (ARTIFICIAL TEARS) 1.4 % ophthalmic solution   Both Eyes   Place 1 drop into both eyes every 8 (eight) hours as needed for dry eyes.         . potassium chloride (K-DUR,KLOR-CON) 10 MEQ tablet   Oral   Take 10 mEq by mouth daily.         . primidone (MYSOLINE) 50 MG tablet   Oral   Take 0.5 tablets (25 mg total) by mouth at bedtime.   15 tablet   5   . sucralfate (CARAFATE) 1 G tablet   Oral   Take 1 g by mouth 4 (four) times daily. *to be crushed in small amount of liquid*         . tobramycin-dexamethasone (TOBRADEX) ophthalmic solution   Right Eye  Place 1 drop into the right eye 4 (four) times daily.         . Vitamin B Complex-C CAPS   Oral   Take 1 capsule by mouth daily.   30 each   12   . vitamin E 400 UNIT capsule   Oral   Take 400 Units by mouth daily.         . clotrimazole-betamethasone (LOTRISONE) cream   Topical   Apply 1 application topically daily as needed.          . ondansetron (ZOFRAN-ODT) 4 MG disintegrating tablet   Oral   Take 4 mg by mouth. Take one every 6 hours as needed for nausea           BP 133/50  Pulse 68  Temp(Src) 98.2 F (36.8 C) (Oral)  Resp 18  SpO2 98% Physical Exam 1350: Physical examination:  Nursing notes reviewed; Vital signs and O2 SAT reviewed;  Constitutional: Well developed, Well nourished, In no acute distress; Head:  Normocephalic, atraumatic; Eyes: EOMI, PERRL, No scleral icterus; ENMT: Mouth and pharynx normal, Mucous membranes dry; Neck: Supple, Full range of motion, No lymphadenopathy; Cardiovascular: Regular rate and rhythm, No gallop; Respiratory: Breath sounds clear & equal bilaterally, No wheezes.  Speaking full sentences with ease, Normal respiratory effort/excursion; Chest: Nontender, Movement normal; Abdomen: Soft, +diffuse tenderness to palp. Nondistended, Normal bowel sounds; Genitourinary: No CVA tenderness; Spine:  No midline CS, TS, LS tenderness. +TTP left hypertonic trapezius muscle.;; Extremities: Pulses normal, No tenderness, No edema, No calf edema or asymmetry.; Neuro: Awake, alert, confused re: time, place, events per hx dementia. Speech clear. Moves all extremities on stretcher spontaneously without apparent gross focal motor deficits..; Skin: Color normal, Warm, Dry.    mmED Course  Procedures    EKG Interpretation    Date/Time:  Friday October 25 2013 17:17:32 EST Ventricular Rate:  76 PR Interval:  252 QRS Duration: 138 QT Interval:  410 QTC Calculation: 461 R Axis:   -44 Text Interpretation:  Sinus rhythm with 1st degree A-V block Left axis deviation Left bundle branch block Abnormal ECG When compared with ECG of 06-Jun-2012 18:34, T wave inversion less evident in Lateral leads Confirmed by [facility]  MD, Nicholos Johns [phone]) on 10/25/2013 5:32:31 PM            MDM  MDM Reviewed: previous chart, nursing note and vitals Reviewed previous: labs and ECG Interpretation: labs, ECG, x-ray and CT scan    Results for orders placed during the hospital encounter of 10/25/13  CBC WITH DIFFERENTIAL      Result Value Range    WBC 22.2 (*) 4.0 - 10.5 K/uL   RBC 3.69 (*) 3.87 - 5.11 MIL/uL   Hemoglobin 12.4  12.0 - 15.0 g/dL   HCT 96.0  45.4 - 09.8 %   MCV 102.4 (*) 78.0 - 100.0 fL   MCH 33.6  26.0 - 34.0 pg   MCHC 32.8  30.0 - 36.0 g/dL   RDW 11.9  14.7 - 82.9 %   Platelets 268  150 - 400 K/uL   Neutrophils Relative % 87 (*) 43 - 77 %   Neutro Abs 19.2 (*) 1.7 - 7.7 K/uL   Lymphocytes Relative 5 (*) 12 - 46 %   Lymphs Abs 1.1  0.7 - 4.0 K/uL   Monocytes Relative 8  3 - 12 %   Monocytes Absolute 1.8 (*) 0.1 - 1.0 K/uL   Eosinophils Relative 0  0 - 5 %  Eosinophils Absolute 0.1  0.0 - 0.7 K/uL   Basophils Relative 0  0 - 1 %   Basophils Absolute 0.0  0.0 - 0.1 K/uL  URINALYSIS W MICROSCOPIC + REFLEX CULTURE      Result Value Range   Color, Urine YELLOW  YELLOW   APPearance CLEAR  CLEAR   Specific Gravity, Urine >1.030 (*) 1.005 - 1.030   pH 5.0  5.0 - 8.0   Glucose, UA NEGATIVE  NEGATIVE mg/dL   Hgb urine dipstick MODERATE (*) NEGATIVE   Bilirubin Urine NEGATIVE  NEGATIVE   Ketones, ur NEGATIVE  NEGATIVE mg/dL   Protein, ur TRACE (*) NEGATIVE mg/dL   Urobilinogen, UA 0.2  0.0 - 1.0 mg/dL   Nitrite NEGATIVE  NEGATIVE   Leukocytes, UA NEGATIVE  NEGATIVE   WBC, UA 0-2  <3 WBC/hpf   RBC / HPF 11-20  <3 RBC/hpf   Bacteria, UA FEW (*) RARE   Squamous Epithelial / LPF FEW (*) RARE  COMPREHENSIVE METABOLIC PANEL      Result Value Range   Sodium 137  135 - 145 mEq/L   Potassium 4.2  3.5 - 5.1 mEq/L   Chloride 100  96 - 112 mEq/L   CO2 26  19 - 32 mEq/L   Glucose, Bld 117 (*) 70 - 99 mg/dL   BUN 28 (*) 6 - 23 mg/dL   Creatinine, Ser 4.09 (*) 0.50 - 1.10 mg/dL   Calcium 9.6  8.4 - 81.1 mg/dL   Total Protein 8.2  6.0 - 8.3 g/dL   Albumin 4.1  3.5 - 5.2 g/dL   AST 21  0 - 37 U/L   ALT 24  0 - 35 U/L   Alkaline Phosphatase 50  39 - 117 U/L   Total Bilirubin 0.2 (*) 0.3 - 1.2 mg/dL   GFR calc non Af Amer 42 (*) >90 mL/min   GFR calc Af Amer 48 (*) >90 mL/min  LIPASE, BLOOD      Result Value Range    Lipase 63 (*) 11 - 59 U/L  TROPONIN I      Result Value Range   Troponin I <0.30  <0.30 ng/mL  LACTIC ACID, PLASMA      Result Value Range   Lactic Acid, Venous 1.2  0.5 - 2.2 mmol/L   Ct Abdomen Pelvis Wo Contrast 10/25/2013   CLINICAL DATA:  Nausea, vomiting, abdominal pain  EXAM: CT ABDOMEN AND PELVIS WITHOUT CONTRAST  TECHNIQUE: Multidetector CT imaging of the abdomen and pelvis was performed following the standard protocol without intravenous contrast.  COMPARISON:  11/23/2011  FINDINGS: Sagittal images of the spine shows diffuse osteopenia. Stable compression fracture of L1 vertebral body. Degenerative changes lumbar spine again noted.  Lung bases are unremarkable. Moderate size hiatal hernia again noted. Oral contrast material was given to the patient.  Unenhanced liver shows no biliary ductal dilatation. The patient is status postcholecystectomy. Fatty replaced pancreas again noted.  The spleen and adrenal glands are unremarkable. Bilateral renal cortical thinning. Again noted complex cyst within right kidney with peripheral calcifications measures 4.3 by 3.8 cm without significant change in size in appearance from prior exam. Nonobstructive punctate calculus in upper pole of the right kidney measures 3 mm.  There is nonobstructive calculus in left renal pelvis measures 1.1 cm. No hydronephrosis or hydroureter. No calcified ureteral calculi are noted bilaterally. Bilateral distal ureter is unremarkable. Atherosclerotic calcifications of abdominal aorta and iliac arteries.  No small bowel obstruction. Unremarkable terminal ileum. No pericecal  inflammation. Scattered diverticula are noted left colon. Multiple sigmoid colon diverticula are noted. No evidence of acute diverticulitis. No calcified calculi are noted within urinary bladder.  Patient is status post hysterectomy.  IMPRESSION: 1. Again noted moderate size hiatal hernia stable from prior exam. 2. Status postcholecystectomy. Fatty replacement  of the pancreas again noted. 3. No hydronephrosis or hydroureter. Stable complex cyst within right kidney. There is nonobstructive calcified calculus in left renal pelvis measures 1.1 cm. No calcified ureteral calculi are identified. 4. Sigmoid colon diverticula.  No evidence of acute diverticulitis. 5. No pericecal inflammation.   Electronically Signed   By: Natasha Mead M.D.   On: 12020-08-613 17:07   Dg Chest 1 View 10/25/2013   CLINICAL DATA:  Vomiting, abdominal pain, diarrhea, weakness.  EXAM: CHEST - 1 VIEW  COMPARISON:  01/24/2012.  FINDINGS: The cardiac silhouette remains borderline enlarged. Interval visualization of a moderate-sized hiatal hernia. Clear lungs. Cervical spine fixation hardware.  IMPRESSION: No acute abnormality.  Moderate-sized hiatal hernia.   Electronically Signed   By: Gordan Payment M.D.   On: 12020-08-613 17:12    1750:  Pt remains afebrile, VSS. No vomiting or stooling while in the ED, but pt has not tol PO without c/o nausea and abd pain. Significant leukocytosis on labs but no clear source for same. Dx and testing d/w pt and family.  Questions answered.  Verb understanding, agreeable to observation admit. T/C to [facility]. Jerral Ralph, case discussed, including:  HPI, pertinent PM/SHx, VS/PE, dx testing, ED course and treatment:  Agreeable to observation admit, requests to write temporary orders, obtain inpt medical bed to team 1.       Laray Anger, DO 10/26/13 1539

## 2013-10-26 DIAGNOSIS — R112 Nausea with vomiting, unspecified: Secondary | ICD-10-CM | POA: Diagnosis not present

## 2013-10-26 DIAGNOSIS — R197 Diarrhea, unspecified: Secondary | ICD-10-CM | POA: Diagnosis not present

## 2013-10-26 LAB — CBC
Hemoglobin: 11.1 g/dL — ABNORMAL LOW (ref 12.0–15.0)
MCHC: 32.7 g/dL (ref 30.0–36.0)
MCV: 103 fL — ABNORMAL HIGH (ref 78.0–100.0)
Platelets: 228 10*3/uL (ref 150–400)
RBC: 3.29 MIL/uL — ABNORMAL LOW (ref 3.87–5.11)
WBC: 5.3 10*3/uL (ref 4.0–10.5)

## 2013-10-26 LAB — BASIC METABOLIC PANEL
Calcium: 8.7 mg/dL (ref 8.4–10.5)
GFR calc non Af Amer: 50 mL/min — ABNORMAL LOW (ref >90)
Sodium: 140 mEq/L (ref 135–145)

## 2013-10-26 LAB — HEMOGLOBIN A1C: Hgb A1c MFr Bld: 6.4 % — ABNORMAL HIGH (ref <5.7)

## 2013-10-26 LAB — GLUCOSE, CAPILLARY: Glucose-Capillary: 117 mg/dL — ABNORMAL HIGH (ref 70–99)

## 2013-10-26 NOTE — Discharge Summary (Signed)
Physician Discharge Summary  [name] [name] ZOX:[id number] DOB: Apr 28, 1924 DOA: 10/25/2013  PCP: Lilyan Punt, MD  Admit date: 10/25/2013 Discharge date: 10/26/2013  Time spent: 45 minutes  Recommendations for Outpatient Follow-up:  -Will be discharged back to ALF today.   Discharge Diagnoses:  Principal Problem:   Nausea vomiting and diarrhea Active Problems:   PARKINSON'S DISEASE   HYPERTENSION   Type II diabetes mellitus   Senile dementia   Leukocytosis   CKD (chronic kidney disease) stage 3, GFR 30-59 ml/min   Discharge Condition: Stable and improved  Filed Weights   10/25/13 2029  Weight: 82.8 kg (182 lb 8.7 oz)    History of present illness:  Patient is an 79 year old white woman resident of the Alzheimer's unit at [facility] who has a past medical history significant for Parkinson's disease frequent falls and dementia as well as hypertension, hyperlipidemia and diabetes. Patient is unable to provide history and hence history is provided by patient's granddaughter who is at bedside at time of my examination. Granddaughter states that about 3 days ago patient had profuse nausea/vomiting/diarrhea that lasted about 48 hours and spontaneously resolved. She states that several residents in the memory care unit have also had the same symptoms. Today patient again experienced nausea and vomiting and the facility got concerned when she complained of neck pain bringing her into the hospital for evaluation. In the emergency department workup is completely negative with the exception of a white blood cell count of 22,000. CT scan of the abdomen and pelvis is negative for acute intra-abdominal pathology. We were asked to admit her for further evaluation and management.   Hospital Course:   Nausea/vomiting/diarrhea -Resolved. -Suspect acute viral gastroenteritis.  Leukocytosis -Resolved -Presumed related to acute viral gastroenteritis.  Diabetes -Well controlled while in the  hospital.  Hypertension -Well controlled.  Chronic kidney disease stage III -At baseline.  Procedures:  None   Consultations:  None  Discharge Instructions  Discharge Orders   Future Appointments Provider Department Dept Phone   12/13/2013 1:30 PM Babs Sciara, MD [facility] [phone]   12/20/2013 12:00 PM Huston Foley, MD [facility] [phone]   Future Orders Complete By Expires   Diet - low sodium heart healthy  As directed    Discontinue IV  As directed    Increase activity slowly  As directed        Medication List    STOP taking these medications       amoxicillin 250 MG capsule  Commonly known as:  AMOXIL     furosemide 20 MG tablet  Commonly known as:  LASIX     potassium chloride 10 MEQ tablet  Commonly known as:  K-DUR,KLOR-CON      TAKE these medications       ARTIFICIAL TEARS 1.4 % ophthalmic solution  Generic drug:  polyvinyl alcohol  Place 1 drop into both eyes every 8 (eight) hours as needed for dry eyes.     aspirin EC 81 MG tablet  Take 81 mg by mouth daily.     atropine 1 % ophthalmic solution  Place 1 drop into the right eye 2 (two) times daily.     citalopram 20 MG tablet  Commonly known as:  CELEXA  Take 20 mg by mouth daily.     clotrimazole-betamethasone cream  Commonly known as:  LOTRISONE  Apply 1 application topically daily as needed.     dextrose 40 % Gel  Commonly known as:  GLUTOSE  Take  1 Tube by mouth once as needed for low blood sugar (for sugar levels less than 60).     donepezil 5 MG tablet  Commonly known as:  ARICEPT  Take 5 mg by mouth every morning.     DUREZOL 0.05 % Emul  Generic drug:  Difluprednate  Place 1 drop into the right eye daily.     HYDROcodone-acetaminophen 5-325 MG per tablet  Commonly known as:  NORCO/VICODIN  Take 1 tablet by mouth every 6 (six) hours as needed for moderate pain.     loratadine 10 MG tablet  Commonly known as:  CLARITIN  Take  10 mg by mouth daily.     losartan 25 MG tablet  Commonly known as:  COZAAR  Take 25 mg by mouth daily.     memantine 10 MG tablet  Commonly known as:  NAMENDA  Take 10 mg by mouth 2 (two) times daily.     methylcellulose 1 % ophthalmic solution  Commonly known as:  ARTIFICIAL TEARS  Place 1 drop into both eyes 3 (three) times daily.     mometasone 0.1 % cream  Commonly known as:  ELOCON  Apply 1 application topically 2 (two) times daily as needed (for irritation).     omeprazole 40 MG capsule  Commonly known as:  PRILOSEC  Take 40 mg by mouth daily.     ondansetron 4 MG disintegrating tablet  Commonly known as:  ZOFRAN-ODT  Take 4 mg by mouth. Take one every 6 hours as needed for nausea     polyethylene glycol packet  Commonly known as:  MIRALAX / GLYCOLAX  Take 17 g by mouth daily. *Mixed with 8 ounces of water/juice daily*     primidone 50 MG tablet  Commonly known as:  MYSOLINE  Take 0.5 tablets (25 mg total) by mouth at bedtime.     PROAIR HFA IN  Inhale into the lungs. 2 puffs every 4 hours as needed     Q-PAP 500 MG tablet  Generic drug:  acetaminophen  Take 500 mg by mouth 2 (two) times daily. *May also take one tablet by mouth once daily as needed for headache     sucralfate 1 G tablet  Commonly known as:  CARAFATE  Take 1 g by mouth 4 (four) times daily. *to be crushed in small amount of liquid*     tobramycin-dexamethasone ophthalmic solution  Commonly known as:  TOBRADEX  Place 1 drop into the right eye 4 (four) times daily.     VITAMIN B COMPLEX-C Caps  Take 1 capsule by mouth daily.     vitamin E 400 UNIT capsule  Take 400 Units by mouth daily.       Allergies  Allergen Reactions  . Codeine Other (See Comments)    REACTION: Unknown  . Morphine Nausea And Vomiting       Follow-up Information   Follow up with LUKING,SCOTT, MD. Schedule an appointment as soon as possible for a visit in 2 weeks.   Specialty:  Family Medicine   Contact  information:   [address] [place] [address] [phone]        The results of significant diagnostics from this hospitalization (including imaging, microbiology, ancillary and laboratory) are listed below for reference.    Significant Diagnostic Studies: Ct Abdomen Pelvis Wo Contrast  10/25/2013   CLINICAL DATA:  Nausea, vomiting, abdominal pain  EXAM: CT ABDOMEN AND PELVIS WITHOUT CONTRAST  TECHNIQUE: Multidetector CT imaging of the  abdomen and pelvis was performed following the standard protocol without intravenous contrast.  COMPARISON:  11/23/2011  FINDINGS: Sagittal images of the spine shows diffuse osteopenia. Stable compression fracture of L1 vertebral body. Degenerative changes lumbar spine again noted.  Lung bases are unremarkable. Moderate size hiatal hernia again noted. Oral contrast material was given to the patient.  Unenhanced liver shows no biliary ductal dilatation. The patient is status postcholecystectomy. Fatty replaced pancreas again noted.  The spleen and adrenal glands are unremarkable. Bilateral renal cortical thinning. Again noted complex cyst within right kidney with peripheral calcifications measures 4.3 by 3.8 cm without significant change in size in appearance from prior exam. Nonobstructive punctate calculus in upper pole of the right kidney measures 3 mm.  There is nonobstructive calculus in left renal pelvis measures 1.1 cm. No hydronephrosis or hydroureter. No calcified ureteral calculi are noted bilaterally. Bilateral distal ureter is unremarkable. Atherosclerotic calcifications of abdominal aorta and iliac arteries.  No small bowel obstruction. Unremarkable terminal ileum. No pericecal inflammation. Scattered diverticula are noted left colon. Multiple sigmoid colon diverticula are noted. No evidence of acute diverticulitis. No calcified calculi are noted within urinary bladder.  Patient is status post hysterectomy.  IMPRESSION: 1. Again noted moderate  size hiatal hernia stable from prior exam. 2. Status postcholecystectomy. Fatty replacement of the pancreas again noted. 3. No hydronephrosis or hydroureter. Stable complex cyst within right kidney. There is nonobstructive calcified calculus in left renal pelvis measures 1.1 cm. No calcified ureteral calculi are identified. 4. Sigmoid colon diverticula.  No evidence of acute diverticulitis. 5. No pericecal inflammation.   Electronically Signed   By: Natasha Mead M.D.   On: 12020/09/813 17:07   Dg Chest 1 View  10/25/2013   CLINICAL DATA:  Vomiting, abdominal pain, diarrhea, weakness.  EXAM: CHEST - 1 VIEW  COMPARISON:  01/24/2012.  FINDINGS: The cardiac silhouette remains borderline enlarged. Interval visualization of a moderate-sized hiatal hernia. Clear lungs. Cervical spine fixation hardware.  IMPRESSION: No acute abnormality.  Moderate-sized hiatal hernia.   Electronically Signed   By: Gordan Payment M.D.   On: 12020/09/813 17:12    Microbiology: No results found for this or any previous visit (from the past 240 hour(s)).   Labs: Basic Metabolic Panel:  Recent Labs Lab 10/25/13 1358 10/26/13 0712  NA 137 140  K 4.2 4.1  CL 100 100  CO2 26 27  GLUCOSE 117* 101*  BUN 28* 19  CREATININE 1.15* 0.99  CALCIUM 9.6 8.7   Liver Function Tests:  Recent Labs Lab 10/25/13 1358  AST 21  ALT 24  ALKPHOS 50  BILITOT 0.2*  PROT 8.2  ALBUMIN 4.1    Recent Labs Lab 10/25/13 1358  LIPASE 63*   No results found for this basename: AMMONIA,  in the last 168 hours CBC:  Recent Labs Lab 10/25/13 1414 10/26/13 0712  WBC 22.2* 5.3  NEUTROABS 19.2*  --   HGB 12.4 11.1*  HCT 37.8 33.9*  MCV 102.4* 103.0*  PLT 268 228   Cardiac Enzymes:  Recent Labs Lab 10/25/13 1358  TROPONINI <0.30   BNP: BNP (last 3 results) No results found for this basename: PROBNP,  in the last 8760 hours CBG:  Recent Labs Lab 10/25/13 2050 10/26/13 0746 10/26/13 1125  GLUCAP 124* 88 117*        Signed:  HERNANDEZ ACOSTA,ESTELA  [facility] Pager: [phone] 10/26/2013, 2:08 PM

## 2013-10-26 NOTE — Progress Notes (Signed)
FL2 completed/updated.  Pt taken to family member's car via wheelchair by staff member, pt is stable, wishes to eat dinner at [facility].

## 2013-10-26 NOTE — Progress Notes (Signed)
[facility] has been notified of patients discharge back to facility.  SW at [facility] working on [employer].  Pt and her family aware of plan, pt to be transported to [facility] by family member.

## 2013-10-26 NOTE — Progress Notes (Signed)
Clinical Child psychotherapist (CSW) received call from [employer] stating that patient is medically stable for D/C back to her ALF [facility]. CSW updated FL2 and faxed it to [facility]. RN confirmed receipt of FL2.   Jetta Lout, LCSWA Weekend CSW [phone]

## 2013-10-28 ENCOUNTER — Telehealth: Payer: Self-pay | Admitting: Family Medicine

## 2013-10-28 LAB — GLUCOSE, CAPILLARY: Glucose-Capillary: 86 mg/dL (ref 70–99)

## 2013-10-28 NOTE — Telephone Encounter (Signed)
Spoke with daughter and transferred up front for appt tomorrow morning with Dr. Lorin Picket

## 2013-10-28 NOTE — Telephone Encounter (Signed)
Patients daughter called again and said to please call back today if possible

## 2013-10-28 NOTE — Telephone Encounter (Signed)
I reviewed the info, I would rec f/u OV on Tuesday. If vomiting worsens then back to er would be wise.(OV in the am )

## 2013-10-28 NOTE — Telephone Encounter (Signed)
Patient went to the hospital over the weekend and the daughter has asked that Dr Lorin Picket take a look at that hospital stay information. They told her it was a virus, but patients daughter thinks otherwise. She has complained of head pain and a sore throat. She has vomited a couple of times since going to the hospital and the home she stays in has tried sending her back to the hospital. Her daughter wants to know if she needs to come in to our office or if it is just a virus?

## 2013-10-29 ENCOUNTER — Encounter: Payer: Self-pay | Admitting: Family Medicine

## 2013-10-29 ENCOUNTER — Ambulatory Visit (INDEPENDENT_AMBULATORY_CARE_PROVIDER_SITE_OTHER): Payer: Medicare Other | Admitting: Family Medicine

## 2013-10-29 ENCOUNTER — Emergency Department (HOSPITAL_COMMUNITY): Payer: Medicare Other

## 2013-10-29 ENCOUNTER — Inpatient Hospital Stay (HOSPITAL_COMMUNITY)
Admission: EM | Admit: 2013-10-29 | Discharge: 2013-11-01 | DRG: 392 | Disposition: A | Discharge status: Nursing Home (Includes ICF) | Payer: Medicare Other | Attending: Internal Medicine | Admitting: Internal Medicine

## 2013-10-29 ENCOUNTER — Encounter (HOSPITAL_COMMUNITY): Payer: Self-pay | Admitting: Emergency Medicine

## 2013-10-29 VITALS — BP 124/60 | Temp 98.5°F

## 2013-10-29 DIAGNOSIS — F0390 Unspecified dementia without behavioral disturbance: Secondary | ICD-10-CM | POA: Diagnosis present

## 2013-10-29 DIAGNOSIS — M810 Age-related osteoporosis without current pathological fracture: Secondary | ICD-10-CM | POA: Diagnosis present

## 2013-10-29 DIAGNOSIS — I2510 Atherosclerotic heart disease of native coronary artery without angina pectoris: Secondary | ICD-10-CM | POA: Diagnosis present

## 2013-10-29 DIAGNOSIS — I129 Hypertensive chronic kidney disease with stage 1 through stage 4 chronic kidney disease, or unspecified chronic kidney disease: Secondary | ICD-10-CM | POA: Diagnosis present

## 2013-10-29 DIAGNOSIS — G20A1 Parkinson's disease without dyskinesia, without mention of fluctuations: Secondary | ICD-10-CM | POA: Diagnosis present

## 2013-10-29 DIAGNOSIS — R51 Headache: Secondary | ICD-10-CM | POA: Diagnosis not present

## 2013-10-29 DIAGNOSIS — N183 Chronic kidney disease, stage 3 unspecified: Secondary | ICD-10-CM

## 2013-10-29 DIAGNOSIS — Z87442 Personal history of urinary calculi: Secondary | ICD-10-CM | POA: Diagnosis not present

## 2013-10-29 DIAGNOSIS — G20 Parkinson's disease: Secondary | ICD-10-CM | POA: Diagnosis present

## 2013-10-29 DIAGNOSIS — Z66 Do not resuscitate: Secondary | ICD-10-CM | POA: Diagnosis present

## 2013-10-29 DIAGNOSIS — N200 Calculus of kidney: Secondary | ICD-10-CM | POA: Diagnosis not present

## 2013-10-29 DIAGNOSIS — Z96659 Presence of unspecified artificial knee joint: Secondary | ICD-10-CM | POA: Diagnosis not present

## 2013-10-29 DIAGNOSIS — E785 Hyperlipidemia, unspecified: Secondary | ICD-10-CM | POA: Diagnosis present

## 2013-10-29 DIAGNOSIS — K449 Diaphragmatic hernia without obstruction or gangrene: Secondary | ICD-10-CM | POA: Diagnosis present

## 2013-10-29 DIAGNOSIS — Z7982 Long term (current) use of aspirin: Secondary | ICD-10-CM | POA: Diagnosis not present

## 2013-10-29 DIAGNOSIS — Z8249 Family history of ischemic heart disease and other diseases of the circulatory system: Secondary | ICD-10-CM | POA: Diagnosis not present

## 2013-10-29 DIAGNOSIS — Z79899 Other long term (current) drug therapy: Secondary | ICD-10-CM

## 2013-10-29 DIAGNOSIS — A088 Other specified intestinal infections: Secondary | ICD-10-CM | POA: Diagnosis present

## 2013-10-29 DIAGNOSIS — G309 Alzheimer's disease, unspecified: Secondary | ICD-10-CM | POA: Diagnosis present

## 2013-10-29 DIAGNOSIS — M1990 Unspecified osteoarthritis, unspecified site: Secondary | ICD-10-CM | POA: Diagnosis present

## 2013-10-29 DIAGNOSIS — R1115 Cyclical vomiting syndrome unrelated to migraine: Principal | ICD-10-CM

## 2013-10-29 DIAGNOSIS — D72829 Elevated white blood cell count, unspecified: Secondary | ICD-10-CM | POA: Diagnosis not present

## 2013-10-29 DIAGNOSIS — Z833 Family history of diabetes mellitus: Secondary | ICD-10-CM

## 2013-10-29 DIAGNOSIS — Z8673 Personal history of transient ischemic attack (TIA), and cerebral infarction without residual deficits: Secondary | ICD-10-CM | POA: Diagnosis not present

## 2013-10-29 DIAGNOSIS — R062 Wheezing: Secondary | ICD-10-CM | POA: Diagnosis not present

## 2013-10-29 DIAGNOSIS — R112 Nausea with vomiting, unspecified: Secondary | ICD-10-CM | POA: Diagnosis not present

## 2013-10-29 DIAGNOSIS — R1110 Vomiting, unspecified: Secondary | ICD-10-CM

## 2013-10-29 DIAGNOSIS — K3184 Gastroparesis: Secondary | ICD-10-CM | POA: Diagnosis present

## 2013-10-29 DIAGNOSIS — K219 Gastro-esophageal reflux disease without esophagitis: Secondary | ICD-10-CM

## 2013-10-29 DIAGNOSIS — R109 Unspecified abdominal pain: Secondary | ICD-10-CM | POA: Diagnosis not present

## 2013-10-29 DIAGNOSIS — R519 Headache, unspecified: Secondary | ICD-10-CM

## 2013-10-29 DIAGNOSIS — J9801 Acute bronchospasm: Secondary | ICD-10-CM | POA: Diagnosis present

## 2013-10-29 DIAGNOSIS — I10 Essential (primary) hypertension: Secondary | ICD-10-CM | POA: Diagnosis present

## 2013-10-29 DIAGNOSIS — F0280 Dementia in other diseases classified elsewhere without behavioral disturbance: Secondary | ICD-10-CM | POA: Diagnosis present

## 2013-10-29 DIAGNOSIS — E119 Type 2 diabetes mellitus without complications: Secondary | ICD-10-CM | POA: Diagnosis present

## 2013-10-29 LAB — CBC WITH DIFFERENTIAL/PLATELET
Basophils Relative: 0 % (ref 0–1)
HCT: 35.3 % — ABNORMAL LOW (ref 36.0–46.0)
Hemoglobin: 11.5 g/dL — ABNORMAL LOW (ref 12.0–15.0)
Lymphocytes Relative: 11 % — ABNORMAL LOW (ref 12–46)
Lymphs Abs: 1.3 10*3/uL (ref 0.7–4.0)
MCH: 33.2 pg (ref 26.0–34.0)
MCHC: 32.6 g/dL (ref 30.0–36.0)
Monocytes Absolute: 1.1 10*3/uL — ABNORMAL HIGH (ref 0.1–1.0)
Monocytes Relative: 9 % (ref 3–12)
Neutro Abs: 9.6 10*3/uL — ABNORMAL HIGH (ref 1.7–7.7)
Platelets: 232 10*3/uL (ref 150–400)
RBC: 3.46 MIL/uL — ABNORMAL LOW (ref 3.87–5.11)

## 2013-10-29 LAB — COMPREHENSIVE METABOLIC PANEL
Albumin: 3.6 g/dL (ref 3.5–5.2)
BUN: 17 mg/dL (ref 6–23)
CO2: 27 mEq/L (ref 19–32)
Chloride: 100 mEq/L (ref 96–112)
Creatinine, Ser: 1.07 mg/dL (ref 0.50–1.10)
GFR calc Af Amer: 53 mL/min — ABNORMAL LOW (ref >90)
GFR calc non Af Amer: 45 mL/min — ABNORMAL LOW (ref >90)
Glucose, Bld: 113 mg/dL — ABNORMAL HIGH (ref 70–99)
Total Bilirubin: 0.2 mg/dL — ABNORMAL LOW (ref 0.3–1.2)

## 2013-10-29 LAB — URINALYSIS, ROUTINE W REFLEX MICROSCOPIC
Bilirubin Urine: NEGATIVE
Hgb urine dipstick: NEGATIVE
Leukocytes, UA: NEGATIVE
Nitrite: NEGATIVE
Protein, ur: NEGATIVE mg/dL
Specific Gravity, Urine: <1.005 — ABNORMAL LOW (ref 1.005–1.030)
pH: 5.5 (ref 5.0–8.0)

## 2013-10-29 LAB — LIPASE, BLOOD: Lipase: 32 U/L (ref 11–59)

## 2013-10-29 MED ORDER — SUCRALFATE 1 G PO TABS
1.0000 g | ORAL_TABLET | Freq: Three times a day (TID) | ORAL | Status: DC
  Administered 2013-10-29 – 2013-11-01 (×10): 1 g via ORAL
  Filled 2013-10-29 (×11): qty 1

## 2013-10-29 MED ORDER — HYDROMORPHONE HCL PF 1 MG/ML IJ SOLN
0.5000 mg | INTRAMUSCULAR | Status: DC | PRN
  Administered 2013-11-01: 0.5 mg via INTRAVENOUS
  Filled 2013-10-29: qty 1

## 2013-10-29 MED ORDER — POLYVINYL ALCOHOL 1.4 % OP SOLN
1.0000 [drp] | Freq: Three times a day (TID) | OPHTHALMIC | Status: DC | PRN
  Filled 2013-10-29: qty 15

## 2013-10-29 MED ORDER — CITALOPRAM HYDROBROMIDE 20 MG PO TABS
20.0000 mg | ORAL_TABLET | Freq: Every day | ORAL | Status: DC
  Administered 2013-10-30 – 2013-11-01 (×3): 20 mg via ORAL
  Filled 2013-10-29 (×3): qty 1

## 2013-10-29 MED ORDER — PRIMIDONE 50 MG PO TABS
25.0000 mg | ORAL_TABLET | Freq: Every day | ORAL | Status: DC
  Administered 2013-10-29 – 2013-10-31 (×3): 25 mg via ORAL
  Filled 2013-10-29 (×6): qty 0.5

## 2013-10-29 MED ORDER — ONDANSETRON HCL 4 MG PO TABS: 4.0000 mg | ORAL_TABLET | Freq: Four times a day (QID) | ORAL | Status: DC | PRN

## 2013-10-29 MED ORDER — ONDANSETRON HCL 4 MG/2ML IJ SOLN
4.0000 mg | Freq: Once | INTRAMUSCULAR | Status: AC
  Administered 2013-10-29: 4 mg via INTRAMUSCULAR

## 2013-10-29 MED ORDER — ENOXAPARIN SODIUM 40 MG/0.4ML ~~LOC~~ SOLN
40.0000 mg | SUBCUTANEOUS | Status: DC
  Administered 2013-10-29 – 2013-10-31 (×3): 40 mg via SUBCUTANEOUS
  Filled 2013-10-29 (×3): qty 0.4

## 2013-10-29 MED ORDER — ACETAMINOPHEN 325 MG PO TABS
650.0000 mg | ORAL_TABLET | Freq: Four times a day (QID) | ORAL | Status: DC | PRN
  Administered 2013-10-29 – 2013-10-30 (×2): 650 mg via ORAL
  Filled 2013-10-29 (×2): qty 2

## 2013-10-29 MED ORDER — DIFLUPREDNATE 0.05 % OP EMUL
1.0000 [drp] | Freq: Every day | OPHTHALMIC | Status: DC
  Filled 2013-10-29: qty 5

## 2013-10-29 MED ORDER — PANTOPRAZOLE SODIUM 40 MG PO TBEC
80.0000 mg | DELAYED_RELEASE_TABLET | Freq: Every day | ORAL | Status: DC
  Administered 2013-10-29 – 2013-10-31 (×3): 80 mg via ORAL
  Filled 2013-10-29 (×3): qty 2

## 2013-10-29 MED ORDER — LOSARTAN POTASSIUM 50 MG PO TABS
25.0000 mg | ORAL_TABLET | Freq: Every day | ORAL | Status: DC
  Administered 2013-10-29 – 2013-11-01 (×4): 25 mg via ORAL
  Filled 2013-10-29 (×4): qty 1

## 2013-10-29 MED ORDER — SODIUM CHLORIDE 0.9 % IV SOLN
INTRAVENOUS | Status: DC
  Administered 2013-10-29: 14:00:00 via INTRAVENOUS

## 2013-10-29 MED ORDER — TOBRAMYCIN-DEXAMETHASONE 0.3-0.1 % OP SUSP
1.0000 [drp] | Freq: Four times a day (QID) | OPHTHALMIC | Status: DC
  Administered 2013-10-29 – 2013-11-01 (×11): 1 [drp] via OPHTHALMIC
  Filled 2013-10-29: qty 2.5

## 2013-10-29 MED ORDER — POLYETHYLENE GLYCOL 3350 17 G PO PACK
17.0000 g | PACK | Freq: Every day | ORAL | Status: DC
  Administered 2013-10-29 – 2013-11-01 (×4): 17 g via ORAL
  Filled 2013-10-29 (×4): qty 1

## 2013-10-29 MED ORDER — ONDANSETRON HCL 4 MG/2ML IJ SOLN: 4.0000 mg | Freq: Four times a day (QID) | INTRAMUSCULAR | Status: DC | PRN

## 2013-10-29 MED ORDER — PRIMIDONE 50 MG PO TABS
ORAL_TABLET | ORAL | Status: AC
  Filled 2013-10-29: qty 1

## 2013-10-29 MED ORDER — IOHEXOL 300 MG/ML  SOLN
100.0000 mL | Freq: Once | INTRAMUSCULAR | Status: AC | PRN
  Administered 2013-10-29: 100 mL via INTRAVENOUS

## 2013-10-29 MED ORDER — SODIUM CHLORIDE 0.9 % IV SOLN
INTRAVENOUS | Status: DC
  Administered 2013-10-29 – 2013-11-01 (×2): via INTRAVENOUS

## 2013-10-29 MED ORDER — ATROPINE SULFATE 1 % OP SOLN
1.0000 [drp] | Freq: Two times a day (BID) | OPHTHALMIC | Status: DC
  Administered 2013-10-29 – 2013-11-01 (×6): 1 [drp] via OPHTHALMIC
  Filled 2013-10-29: qty 2

## 2013-10-29 MED ORDER — IOHEXOL 300 MG/ML  SOLN
50.0000 mL | Freq: Once | INTRAMUSCULAR | Status: AC | PRN
  Administered 2013-10-29: 50 mL via ORAL

## 2013-10-29 MED ORDER — ACETAMINOPHEN 650 MG RE SUPP: 650.0000 mg | Freq: Four times a day (QID) | RECTAL | Status: DC | PRN

## 2013-10-29 MED ORDER — MEMANTINE HCL 10 MG PO TABS
10.0000 mg | ORAL_TABLET | Freq: Two times a day (BID) | ORAL | Status: DC
  Administered 2013-10-29 – 2013-11-01 (×6): 10 mg via ORAL
  Filled 2013-10-29 (×6): qty 1

## 2013-10-29 MED ORDER — DONEPEZIL HCL 5 MG PO TABS
5.0000 mg | ORAL_TABLET | Freq: Every morning | ORAL | Status: DC
  Administered 2013-10-30 – 2013-11-01 (×3): 5 mg via ORAL
  Filled 2013-10-29 (×3): qty 1

## 2013-10-29 MED ORDER — ASPIRIN EC 81 MG PO TBEC
81.0000 mg | DELAYED_RELEASE_TABLET | Freq: Every day | ORAL | Status: DC
  Administered 2013-10-29 – 2013-11-01 (×4): 81 mg via ORAL
  Filled 2013-10-29 (×4): qty 1

## 2013-10-29 NOTE — H&P (Signed)
[facility] History and Physical  DANAISHA CELLI ZOX:[id number] DOB: 01-Jan-1924 DOA: 10/29/2013  Referring physician: Dr. Rosalia Hammers PCP: Lilyan Punt, MD  Specialists: GI: Dr. Karilyn Cota  Chief Complaint: vomiting  HPI: [name] [name] is a 79 y.o. female who is a resident of [facility], was recently in the hospital and was discharged after being treated for abdominal pain and vomiting.  At that time it was felt that she likely had a viral gastroenteritis and since she had clinically improved the following day, she was discharged home. Her daughter reports that the following day on Sunday, she began developing vomiting.  This was only one episode per day.  She did not have any diarrhea and did not complain of abdominal pain.  When she went to her primary care doc today for hospital follow up, she vomited 3 times and complained of headache.  She was sent to the ED for evaluation. CT of the head did now show any acute findings.  CT of the abdomen and pelvis also was relatively unremarkable.  Urinalysis was also negative as were LFTs/lipase.  On exam it was noted that she had diffuse abdominal tenderness and she had a mild leukocytosis.  She has been referred for admission.  Review of Systems: difficult to assess due to dementia, history obtained from daughter  Past Medical History  Diagnosis Date  . ASCVD (arteriosclerotic cardiovascular disease) 2003    Single vessel disease-60% D1  . Hypertension   . Hyperlipidemia   . Diabetes mellitus     No insulin  . Leukopenia     and anemia-hematology evaluation was nodiagnostic  . Degenerative joint disease     right TKA in 4/06  . Upper GI bleed     gastric stress ulcer in 2003  . Retinal artery branch occlusion of right eye     Sudden onset of right eye blindness-embolism suspected  . Gastroesophageal reflux disease with hiatal hernia   . Osteoporosis   . Lung nodule     Left lower lobe  . Parkinson's disease   . Dementia   . Cervical  spondylarthritis     S/p discectomy  . Alzheimer disease   . Diverticulosis   . Hiatal hernia   . Nephrolithiasis    Past Surgical History  Procedure Laterality Date  . Total abdominal hysterectomy    . Cervical discectomy      Dr. Newell Coral  . Cholecystectomy    . Total knee arthroplasty  2006    Right; Dr. Eulah Pont  . Colonoscopy  2009  . Joint replacement    . Vascular surgery    . Cystoscopy w/ ureteral stent placement  09/23/2011    Procedure: CYSTOSCOPY WITH RETROGRADE PYELOGRAM/URETERAL STENT PLACEMENT;  Surgeon: Ky Barban;  Location: [facility];  Service: Urology;  Laterality: Right;   Social History:  reports that she has never smoked. She has never used smokeless tobacco. She reports that she does not drink alcohol or use illicit drugs.   Allergies  Allergen Reactions  . Codeine Other (See Comments)    REACTION: Unknown  . Morphine Nausea And Vomiting    Family History  Problem Relation Age of Onset  . Diabetes Sister   . Heart disease Sister   . Diabetes Brother     Prior to Admission medications   Medication Sig Start Date End Date Taking? Authorizing Provider  acetaminophen (TYLENOL) 500 MG tablet Take 500 mg by mouth 2 (two) times daily. *May take one tablet daily as needed  for headache*   Yes Historical Provider, MD  aspirin EC 81 MG tablet Take 81 mg by mouth daily.   Yes Historical Provider, MD  atropine 1 % ophthalmic solution Place 1 drop into the right eye 2 (two) times daily.    Yes Historical Provider, MD  citalopram (CELEXA) 20 MG tablet Take 20 mg by mouth daily.   Yes Historical Provider, MD  Difluprednate (DUREZOL) 0.05 % EMUL Place 1 drop into the right eye daily.   Yes Historical Provider, MD  donepezil (ARICEPT) 5 MG tablet Take 5 mg by mouth every morning.   Yes Historical Provider, MD  loratadine (CLARITIN) 10 MG tablet Take 10 mg by mouth daily.   Yes Historical Provider, MD  losartan (COZAAR) 25 MG tablet Take 25 mg by mouth daily.   Yes  Historical Provider, MD  memantine (NAMENDA) 10 MG tablet Take 10 mg by mouth 2 (two) times daily.   Yes Historical Provider, MD  methylcellulose (ARTIFICIAL TEARS) 1 % ophthalmic solution Place 1 drop into both eyes 3 (three) times daily.    Yes Historical Provider, MD  omeprazole (PRILOSEC) 40 MG capsule Take 40 mg by mouth daily.   Yes Historical Provider, MD  polyethylene glycol (MIRALAX / GLYCOLAX) packet Take 17 g by mouth daily. *Mixed with 8 ounces of water/juice daily*   Yes Historical Provider, MD  primidone (MYSOLINE) 50 MG tablet Take 0.5 tablets (25 mg total) by mouth at bedtime. 07/04/13  Yes Huston Foley, MD  sucralfate (CARAFATE) 1 G tablet Take 1 g by mouth 4 (four) times daily. *to be crushed in small amount of liquid*   Yes Historical Provider, MD  tobramycin-dexamethasone (TOBRADEX) ophthalmic solution Place 1 drop into the right eye 4 (four) times daily.   Yes Historical Provider, MD  Vitamin B Complex-C CAPS Take 1 capsule by mouth daily. 02/11/13  Yes Babs Sciara, MD  vitamin E 400 UNIT capsule Take 400 Units by mouth daily.   Yes Historical Provider, MD  Albuterol Sulfate (PROAIR HFA IN) Inhale into the lungs. 2 puffs every 4 hours as needed    Historical Provider, MD  clotrimazole-betamethasone (LOTRISONE) cream Apply 1 application topically daily as needed.  08/23/13   Historical Provider, MD  dextrose (GLUTOSE) 40 % GEL Take 1 Tube by mouth once as needed for low blood sugar (for sugar levels less than 60).    Historical Provider, MD  HYDROcodone-acetaminophen (NORCO/VICODIN) 5-325 MG per tablet Take 1 tablet by mouth every 6 (six) hours as needed for moderate pain.  09/13/13   Historical Provider, MD  mometasone (ELOCON) 0.1 % cream Apply 1 application topically 2 (two) times daily as needed (for irritation).    Historical Provider, MD  ondansetron (ZOFRAN-ODT) 4 MG disintegrating tablet Take 4 mg by mouth. Take one every 6 hours as needed for nausea    Historical Provider,  MD  polyvinyl alcohol (ARTIFICIAL TEARS) 1.4 % ophthalmic solution Place 1 drop into both eyes every 8 (eight) hours as needed for dry eyes.    Historical Provider, MD   Physical Exam: Filed Vitals:   10/29/13 1309  BP: 128/61  Pulse: 72  Temp: 98.3 F (36.8 C)  Resp: 20     General:  NAD  Eyes: no drainage or signs of infection noted in either eye  ENT: mucous membranes are moist  Neck:  supple  Cardiovascular: s1, s2, rrr  Respiratory: cta b  Abdomen: soft, diffusely tender, bs+  Skin: no rashes  Musculoskeletal: trace  edema b/l  Psychiatric: confused, demented  Neurologic: unable to participate in exam due to dementia, grossly intact  Labs on Admission:  Basic Metabolic Panel:  Recent Labs Lab 10/25/13 1358 10/26/13 0712 10/29/13 1439  NA 137 140 136  K 4.2 4.1 4.2  CL 100 100 100  CO2 26 27 27   GLUCOSE 117* 101* 113*  BUN 28* 19 17  CREATININE 1.15* 0.99 1.07  CALCIUM 9.6 8.7 9.4   Liver Function Tests:  Recent Labs Lab 10/25/13 1358 10/29/13 1439  AST 21 37  ALT 24 40*  ALKPHOS 50 48  BILITOT 0.2* 0.2*  PROT 8.2 7.2  ALBUMIN 4.1 3.6    Recent Labs Lab 10/25/13 1358 10/29/13 1439  LIPASE 63* 32   No results found for this basename: AMMONIA,  in the last 168 hours CBC:  Recent Labs Lab 10/25/13 1414 10/26/13 0712 10/29/13 1439  WBC 22.2* 5.3 12.1*  NEUTROABS 19.2*  --  9.6*  HGB 12.4 11.1* 11.5*  HCT 37.8 33.9* 35.3*  MCV 102.4* 103.0* 102.0*  PLT 268 228 232   Cardiac Enzymes:  Recent Labs Lab 10/25/13 1358  TROPONINI <0.30    BNP (last 3 results) No results found for this basename: PROBNP,  in the last 8760 hours CBG:  Recent Labs Lab 10/25/13 2050 10/26/13 0746 10/26/13 1125 10/26/13 1628  GLUCAP 124* 88 117* 86    Radiological Exams on Admission: Ct Head Wo Contrast  10/29/2013   CLINICAL DATA:  Vomiting, headache  EXAM: CT HEAD WITHOUT CONTRAST  TECHNIQUE: Contiguous axial images were obtained from  the base of the skull through the vertex without intravenous contrast.  COMPARISON:  06/16/2013  FINDINGS: Few scattered motion artifacts.  Generalized atrophy.  Normal ventricular morphology.  No midline shift or mass effect.  Small vessel chronic ischemic changes of deep cerebral white matter.  No intracranial hemorrhage, mass lesion, or acute infarction.  Question tiny old right thalamic lacunar infarct.  Visualized paranasal sinuses and mastoid air cells clear.  Bones demineralized.  Right phthysis bulbi.  IMPRESSION: Atrophy with small vessel chronic ischemic changes of deep cerebral white matter.  Question tiny old lacunar infarct right thalamus.  No acute intracranial abnormalities.   Electronically Signed   By: Ulyses Southward M.D.   On: 10/29/2013 13:35   Ct Abdomen Pelvis W Contrast  10/29/2013   CLINICAL DATA:  Right-side abdominal pain, vomiting, tenderness right abdomen, history diabetes, Parkinson's, Alzheimer's, hypertension  EXAM: CT ABDOMEN AND PELVIS WITH CONTRAST  TECHNIQUE: Multidetector CT imaging of the abdomen and pelvis was performed using the standard protocol following bolus administration of intravenous contrast. Sagittal and coronal MPR images reconstructed from axial data set.  CONTRAST:  OMNIPAQUE IOHEXOL 300 MG/ML SOLN. Dilute oral contrast.  COMPARISON:  103/17/2014, 07/25/2002  FINDINGS: Minimal scarring at lung bases.  Post cholecystectomy.  Mild diffuse fatty infiltration of liver.  Moderate-sized hiatal hernia.  Liver, spleen, pancreas, and adrenal glands otherwise normal appearance.  Nonobstructing calculus left renal pelvis, 15 x 10 x 11 mm.  Small extrarenal pelvis right kidney.  Complicated cystic lesion with calcification at the mid inferior pole of the right kidney, 4.6 x 4.6 x 2.9 cm unchanged since 103/17/2014 by my measurements ; in 2003 this measured 4.6 x 3.7 cm in greatest axial dimensions and now contains a few more peripheral calcifications.  No ureteral  calcification or dilatation.  Bladder unremarkable.  Diverticulosis of the sigmoid colon without evidence of diverticulitis.  Normal appearing retrocecal appendix.  Questionable in anorectal wall thickening.  Stomach and bowel loops otherwise unremarkable.  Uterus surgically absent with atrophic ovaries.  No mass, adenopathy, free fluid or inflammatory process.  Bones appear demineralized with chronic superior endplate compression deformity of L1 and scattered degenerative disc/facet disease changes.  IMPRESSION: Large nonobstructing left renal calculus.  Complicated cystic lesion of the right kidney 4.6 x 4.6 x 2.9 cm, minimally increased in size since 2003 and containing a few more peripheral calcifications.  Sigmoid diverticulosis.  Moderate-sized hiatal hernia.  Questionable anorectal wall thickening, recommend correlation with physical exam and proctoscopy.  No cause for right-side abdominal pain identified.   Electronically Signed   By: Ulyses Southward M.D.   On: 10/29/2013 16:11    Assessment/Plan Active Problems:   HYPERTENSION   Type II diabetes mellitus   Senile dementia   GERD (gastroesophageal reflux disease)   Leukocytosis   CKD (chronic kidney disease) stage 3, GFR 30-59 ml/min   Abdominal pain   Vomiting   Headache   1. Vomiting and abdominal pain.  Etiology is not entirely clear.  No acute findings on imaging.  Will treat supportively with antiemetics, IV fluids and clear liquids.  Diet can be advanced as tolerated.  Since patient has been readmitted with same symptoms, will have Dr. Karilyn Cota evaluate patient. 2. Type 2 diabetes.  Sliding scale insulin 3. GERD continue PPI 4. Headache, unclear etiology, blood pressure and CT head unremarkable.  Treat with tylenol for now. 5. CKD stage 3, renal function at baseline 6. Dementia 7. Leukocytosis, may be stress induced  Code Status: DNR Family Communication: discussed with daughter at the bedside Disposition Plan: Patient will be  admitted under observation status, discharge home in the next 24-48 hours  Time spent:  Monda Chastain [facility] Pager [phone]  If 7PM-7AM, please contact night-coverage [email or web address] Password [email or web address] 10/29/2013, 5:30 PM

## 2013-10-29 NOTE — ED Notes (Signed)
Xray called pt screaming in CT, Daughter took the to xray to calm pt. Daughter states this is normal, at timet she takes spells.

## 2013-10-29 NOTE — ED Notes (Signed)
Pt had BM, wearing diaper, pt cleaned and dried

## 2013-10-29 NOTE — ED Notes (Signed)
Pt went for procedure needs updated vitals

## 2013-10-29 NOTE — ED Notes (Signed)
Vomiting , headache, Sent from Dr Cathlyn Parsons office for evaluation.  Admitted to [facility] 12/5. Pt is from [facility].

## 2013-10-29 NOTE — ED Provider Notes (Signed)
CSN: [id number]     Arrival date & time 10/29/13  1204 History   First MD Initiated Contact with Patient 10/29/13 1255    Level V caveat dementia Chief Complaint  Patient presents with  . Emesis   (Consider location/radiation/quality/duration/timing/severity/associated sxs/prior Treatment) HPI This is an 79 year old woman with a history of dementia who presents today from Dr. Fletcher Anon office. Friday she began having some vomiting at the assisted living care facility where she stays. She was sent to the emergency department and noted to have an elevated white blood cell count 22,000. She was admitted overnight during that stay had a CT scan of her abdomen which did not show any source of pain or vomiting. Her leukocytosis resolved the next day and she was sent back to her rest home. The daughter who is with her reports that she again and vomited on Sunday she vomited again today and was seen in Dr. Fletcher Anon office where she vomited twice and also complained of a headache. I spoke with Dr. Gerda Diss and the patient was subsequently sent here for further evaluation with a head CT. Past Medical History  Diagnosis Date  . ASCVD (arteriosclerotic cardiovascular disease) 2003    Single vessel disease-60% D1  . Hypertension   . Hyperlipidemia   . Diabetes mellitus     No insulin  . Leukopenia     and anemia-hematology evaluation was nodiagnostic  . Degenerative joint disease     right TKA in 4/06  . Upper GI bleed     gastric stress ulcer in 2003  . Retinal artery branch occlusion of right eye     Sudden onset of right eye blindness-embolism suspected  . Gastroesophageal reflux disease with hiatal hernia   . Osteoporosis   . Lung nodule     Left lower lobe  . Parkinson's disease   . Dementia   . Cervical spondylarthritis     S/p discectomy  . Alzheimer disease   . Diverticulosis   . Hiatal hernia   . Nephrolithiasis    Past Surgical History  Procedure Laterality Date  . Total abdominal  hysterectomy    . Cervical discectomy      Dr. Newell Coral  . Cholecystectomy    . Total knee arthroplasty  2006    Right; Dr. Eulah Pont  . Colonoscopy  2009  . Joint replacement    . Vascular surgery    . Cystoscopy w/ ureteral stent placement  09/23/2011    Procedure: CYSTOSCOPY WITH RETROGRADE PYELOGRAM/URETERAL STENT PLACEMENT;  Surgeon: Ky Barban;  Location: [facility];  Service: Urology;  Laterality: Right;   Family History  Problem Relation Age of Onset  . Diabetes Sister   . Heart disease Sister   . Diabetes Brother    History  Substance Use Topics  . Smoking status: Never Smoker   . Smokeless tobacco: Never Used  . Alcohol Use: No   OB History   Grav Para Term Preterm Abortions TAB SAB Ect Mult Living                 Review of Systems  All other systems reviewed and are negative.    Allergies  Codeine and Morphine  Home Medications   Current Outpatient Rx  Name  Route  Sig  Dispense  Refill  . acetaminophen (TYLENOL) 500 MG tablet   Oral   Take 500 mg by mouth 2 (two) times daily. *May take one tablet daily as needed for headache*         .  aspirin EC 81 MG tablet   Oral   Take 81 mg by mouth daily.         Marland Kitchen atropine 1 % ophthalmic solution   Right Eye   Place 1 drop into the right eye 2 (two) times daily.          . citalopram (CELEXA) 20 MG tablet   Oral   Take 20 mg by mouth daily.         . Difluprednate (DUREZOL) 0.05 % EMUL   Right Eye   Place 1 drop into the right eye daily.         Marland Kitchen donepezil (ARICEPT) 5 MG tablet   Oral   Take 5 mg by mouth every morning.         . loratadine (CLARITIN) 10 MG tablet   Oral   Take 10 mg by mouth daily.         Marland Kitchen losartan (COZAAR) 25 MG tablet   Oral   Take 25 mg by mouth daily.         . memantine (NAMENDA) 10 MG tablet   Oral   Take 10 mg by mouth 2 (two) times daily.         . methylcellulose (ARTIFICIAL TEARS) 1 % ophthalmic solution   Both Eyes   Place 1 drop into  both eyes 3 (three) times daily.          Marland Kitchen omeprazole (PRILOSEC) 40 MG capsule   Oral   Take 40 mg by mouth daily.         . polyethylene glycol (MIRALAX / GLYCOLAX) packet   Oral   Take 17 g by mouth daily. *Mixed with 8 ounces of water/juice daily*         . primidone (MYSOLINE) 50 MG tablet   Oral   Take 0.5 tablets (25 mg total) by mouth at bedtime.   15 tablet   5   . sucralfate (CARAFATE) 1 G tablet   Oral   Take 1 g by mouth 4 (four) times daily. *to be crushed in small amount of liquid*         . tobramycin-dexamethasone (TOBRADEX) ophthalmic solution   Right Eye   Place 1 drop into the right eye 4 (four) times daily.         . Vitamin B Complex-C CAPS   Oral   Take 1 capsule by mouth daily.   30 each   12   . vitamin E 400 UNIT capsule   Oral   Take 400 Units by mouth daily.         . Albuterol Sulfate (PROAIR HFA IN)   Inhalation   Inhale into the lungs. 2 puffs every 4 hours as needed         . clotrimazole-betamethasone (LOTRISONE) cream   Topical   Apply 1 application topically daily as needed.          Marland Kitchen dextrose (GLUTOSE) 40 % GEL   Oral   Take 1 Tube by mouth once as needed for low blood sugar (for sugar levels less than 60).         Marland Kitchen HYDROcodone-acetaminophen (NORCO/VICODIN) 5-325 MG per tablet   Oral   Take 1 tablet by mouth every 6 (six) hours as needed for moderate pain.          . mometasone (ELOCON) 0.1 % cream   Topical   Apply 1 application topically 2 (two) times daily as needed (for  irritation).         . ondansetron (ZOFRAN-ODT) 4 MG disintegrating tablet   Oral   Take 4 mg by mouth. Take one every 6 hours as needed for nausea         . polyvinyl alcohol (ARTIFICIAL TEARS) 1.4 % ophthalmic solution   Both Eyes   Place 1 drop into both eyes every 8 (eight) hours as needed for dry eyes.          BP 128/61  Pulse 72  Temp(Src) 98.3 F (36.8 C) (Oral)  Resp 20  Ht 5' (1.524 m)  Wt 182 lb (82.555  kg)  BMI 35.54 kg/m2  SpO2 100% Physical Exam  Nursing note and vitals reviewed. Constitutional: She appears well-developed and well-nourished.  HENT:  Head: Normocephalic and atraumatic.  Right Ear: External ear normal.  Left Ear: External ear normal.  Nose: Nose normal.  Mouth/Throat: Oropharynx is clear and moist.  Eyes: Conjunctivae and EOM are normal. Pupils are equal, round, and reactive to light.  Neck: Normal range of motion.  Cardiovascular: Normal rate, regular rhythm, normal heart sounds and intact distal pulses.   Pulmonary/Chest: Effort normal and breath sounds normal.  Abdominal: Bowel sounds are normal. There is tenderness.  Diffuse ttp greates rlq  Musculoskeletal: Normal range of motion.  Healing wound left lower medial leg  Neurological: She is alert. She displays normal reflexes. She exhibits normal muscle tone. Coordination normal.  Skin: Skin is warm.    ED Course  Procedures (including critical care time) Labs Review Labs Reviewed  CBC WITH DIFFERENTIAL - Abnormal; Notable for the following:    WBC 12.1 (*)    RBC 3.46 (*)    Hemoglobin 11.5 (*)    HCT 35.3 (*)    MCV 102.0 (*)    Neutrophils Relative % 79 (*)    Neutro Abs 9.6 (*)    Lymphocytes Relative 11 (*)    Monocytes Absolute 1.1 (*)    All other components within normal limits  COMPREHENSIVE METABOLIC PANEL - Abnormal; Notable for the following:    Glucose, Bld 113 (*)    ALT 40 (*)    Total Bilirubin 0.2 (*)    GFR calc non Af Amer 45 (*)    GFR calc Af Amer 53 (*)    All other components within normal limits  LIPASE, BLOOD  URINALYSIS, ROUTINE W REFLEX MICROSCOPIC   Imaging Review Ct Head Wo Contrast  10/29/2013   CLINICAL DATA:  Vomiting, headache  EXAM: CT HEAD WITHOUT CONTRAST  TECHNIQUE: Contiguous axial images were obtained from the base of the skull through the vertex without intravenous contrast.  COMPARISON:  06/16/2013  FINDINGS: Few scattered motion artifacts.  Generalized  atrophy.  Normal ventricular morphology.  No midline shift or mass effect.  Small vessel chronic ischemic changes of deep cerebral white matter.  No intracranial hemorrhage, mass lesion, or acute infarction.  Question tiny old right thalamic lacunar infarct.  Visualized paranasal sinuses and mastoid air cells clear.  Bones demineralized.  Right phthysis bulbi.  IMPRESSION: Atrophy with small vessel chronic ischemic changes of deep cerebral white matter.  Question tiny old lacunar infarct right thalamus.  No acute intracranial abnormalities.   Electronically Signed   By: Ulyses Southward M.D.   On: 10/29/2013 13:35   Ct Abdomen Pelvis Wo Contrast  10/25/2013   CLINICAL DATA:  Nausea, vomiting, abdominal pain  EXAM: CT ABDOMEN AND PELVIS WITHOUT CONTRAST  TECHNIQUE: Multidetector CT imaging of the abdomen and  pelvis was performed following the standard protocol without intravenous contrast.  COMPARISON:  11/23/2011  FINDINGS: Sagittal images of the spine shows diffuse osteopenia. Stable compression fracture of L1 vertebral body. Degenerative changes lumbar spine again noted.  Lung bases are unremarkable. Moderate size hiatal hernia again noted. Oral contrast material was given to the patient.  Unenhanced liver shows no biliary ductal dilatation. The patient is status postcholecystectomy. Fatty replaced pancreas again noted.  The spleen and adrenal glands are unremarkable. Bilateral renal cortical thinning. Again noted complex cyst within right kidney with peripheral calcifications measures 4.3 by 3.8 cm without significant change in size in appearance from prior exam. Nonobstructive punctate calculus in upper pole of the right kidney measures 3 mm.  There is nonobstructive calculus in left renal pelvis measures 1.1 cm. No hydronephrosis or hydroureter. No calcified ureteral calculi are noted bilaterally. Bilateral distal ureter is unremarkable. Atherosclerotic calcifications of abdominal aorta and iliac arteries.  No  small bowel obstruction. Unremarkable terminal ileum. No pericecal inflammation. Scattered diverticula are noted left colon. Multiple sigmoid colon diverticula are noted. No evidence of acute diverticulitis. No calcified calculi are noted within urinary bladder.  Patient is status post hysterectomy.  IMPRESSION: 1. Again noted moderate size hiatal hernia stable from prior exam. 2. Status postcholecystectomy. Fatty replacement of the pancreas again noted. 3. No hydronephrosis or hydroureter. Stable complex cyst within right kidney. There is nonobstructive calcified calculus in left renal pelvis measures 1.1 cm. No calcified ureteral calculi are identified. 4. Sigmoid colon diverticula.  No evidence of acute diverticulitis. 5. No pericecal inflammation.   Electronically Signed   By: Natasha Mead M.D.   On: 130-Jun-202014 17:07   Dg Chest 1 View  10/25/2013   CLINICAL DATA:  Vomiting, abdominal pain, diarrhea, weakness.  EXAM: CHEST - 1 VIEW  COMPARISON:  01/24/2012.  FINDINGS: The cardiac silhouette remains borderline enlarged. Interval visualization of a moderate-sized hiatal hernia. Clear lungs. Cervical spine fixation hardware.  IMPRESSION: No acute abnormality.  Moderate-sized hiatal hernia.   Electronically Signed   By: Gordan Payment M.D.   On: 130-Jun-202014 17:12   Ct Head Wo Contrast  10/29/2013   CLINICAL DATA:  Vomiting, headache  EXAM: CT HEAD WITHOUT CONTRAST  TECHNIQUE: Contiguous axial images were obtained from the base of the skull through the vertex without intravenous contrast.  COMPARISON:  06/16/2013  FINDINGS: Few scattered motion artifacts.  Generalized atrophy.  Normal ventricular morphology.  No midline shift or mass effect.  Small vessel chronic ischemic changes of deep cerebral white matter.  No intracranial hemorrhage, mass lesion, or acute infarction.  Question tiny old right thalamic lacunar infarct.  Visualized paranasal sinuses and mastoid air cells clear.  Bones demineralized.  Right phthysis  bulbi.  IMPRESSION: Atrophy with small vessel chronic ischemic changes of deep cerebral white matter.  Question tiny old lacunar infarct right thalamus.  No acute intracranial abnormalities.   Electronically Signed   By: Ulyses Southward M.D.   On: 10/29/2013 13:35   Ct Abdomen Pelvis W Contrast  10/29/2013   CLINICAL DATA:  Right-side abdominal pain, vomiting, tenderness right abdomen, history diabetes, Parkinson's, Alzheimer's, hypertension  EXAM: CT ABDOMEN AND PELVIS WITH CONTRAST  TECHNIQUE: Multidetector CT imaging of the abdomen and pelvis was performed using the standard protocol following bolus administration of intravenous contrast. Sagittal and coronal MPR images reconstructed from axial data set.  CONTRAST:  OMNIPAQUE IOHEXOL 300 MG/ML SOLN. Dilute oral contrast.  COMPARISON:  130-Jun-202014, 07/25/2002  FINDINGS: Minimal scarring at lung  bases.  Post cholecystectomy.  Mild diffuse fatty infiltration of liver.  Moderate-sized hiatal hernia.  Liver, spleen, pancreas, and adrenal glands otherwise normal appearance.  Nonobstructing calculus left renal pelvis, 15 x 10 x 11 mm.  Small extrarenal pelvis right kidney.  Complicated cystic lesion with calcification at the mid inferior pole of the right kidney, 4.6 x 4.6 x 2.9 cm unchanged since 1Dec 06, 202014 by my measurements ; in 2003 this measured 4.6 x 3.7 cm in greatest axial dimensions and now contains a few more peripheral calcifications.  No ureteral calcification or dilatation.  Bladder unremarkable.  Diverticulosis of the sigmoid colon without evidence of diverticulitis.  Normal appearing retrocecal appendix.  Questionable in anorectal wall thickening.  Stomach and bowel loops otherwise unremarkable.  Uterus surgically absent with atrophic ovaries.  No mass, adenopathy, free fluid or inflammatory process.  Bones appear demineralized with chronic superior endplate compression deformity of L1 and scattered degenerative disc/facet disease changes.   IMPRESSION: Large nonobstructing left renal calculus.  Complicated cystic lesion of the right kidney 4.6 x 4.6 x 2.9 cm, minimally increased in size since 2003 and containing a few more peripheral calcifications.  Sigmoid diverticulosis.  Moderate-sized hiatal hernia.  Questionable anorectal wall thickening, recommend correlation with physical exam and proctoscopy.  No cause for right-side abdominal pain identified.   Electronically Signed   By: Ulyses Southward M.D.   On: 10/29/2013 16:11   EKG Interpretation   None       MDM  No diagnosis found. Patient with normal head ct but has abdominal pain on exam  She is having a repeat ct with contrast of her abdomen to evaluate for appendicitis.  Patient with ongoing abdominal pain and nausea.  She continues tender to palpation on exam. White blood cell count is moderately elevated. Plan observation for further evaluation   Hilario Quarry, MD 11/01/13 1455

## 2013-10-29 NOTE — ED Notes (Signed)
Daughter states someone in the family stays with the pt every night. They will be someone to stay with her in the hospital also.

## 2013-10-29 NOTE — ED Notes (Signed)
MD at the bedside tp speak with daughter. Pt drinking sprite, pt is calm

## 2013-10-29 NOTE — Progress Notes (Signed)
   Subjective:    Patient ID: [name] [name], female    DOB: 1924/06/14, 79 y.o.   MRN: [id number]  HPIFollow up from hospital admission last Friday.  Went to hospital for headaches, vomiting, neck pain. Elevated WBC.  Hosp notes reviewed Pt with Ha and V several times past few days V times 3 here No fever pmh alzheimers   Review of Systems Unable to get well due to dementia    Objective:   Physical Exam  Constitutional: She appears well-developed.  HENT:  Head: Normocephalic.  Right Ear: External ear normal.  Left Ear: External ear normal.  Neck: Neck supple. No thyromegaly present.  Cardiovascular: Normal rate, regular rhythm and normal heart sounds.   Pulmonary/Chest: Effort normal and breath sounds normal. No respiratory distress.  Abdominal: Soft. She exhibits no distension.  Musculoskeletal: She exhibits no edema.  Wound on lower leg from skin biopsy with local infection  Lymphadenopathy:    She has no cervical adenopathy.  Neurological: She is alert.  Skin: Skin is warm and dry. She is not diaphoretic.    C/o HA, multiple rounds of V past few days  Pt with alzheimer's    Assessment & Plan:  Emergent CT Head w/o contrast- to r/o stroke and bleed   WUJ:[id number] Ha and vomiting Referred to ED zofran use tid for 3 days Bland diet and clear liquids Recheck in 7 days

## 2013-10-30 ENCOUNTER — Telehealth (INDEPENDENT_AMBULATORY_CARE_PROVIDER_SITE_OTHER): Payer: Self-pay | Admitting: Internal Medicine

## 2013-10-30 DIAGNOSIS — N200 Calculus of kidney: Secondary | ICD-10-CM | POA: Diagnosis not present

## 2013-10-30 DIAGNOSIS — D72829 Elevated white blood cell count, unspecified: Secondary | ICD-10-CM | POA: Diagnosis not present

## 2013-10-30 DIAGNOSIS — R1115 Cyclical vomiting syndrome unrelated to migraine: Secondary | ICD-10-CM | POA: Diagnosis present

## 2013-10-30 DIAGNOSIS — R1110 Vomiting, unspecified: Secondary | ICD-10-CM

## 2013-10-30 DIAGNOSIS — R109 Unspecified abdominal pain: Secondary | ICD-10-CM

## 2013-10-30 LAB — CBC
Hemoglobin: 10 g/dL — ABNORMAL LOW (ref 12.0–15.0)
MCH: 33.7 pg (ref 26.0–34.0)
MCHC: 33.1 g/dL (ref 30.0–36.0)
Platelets: 215 10*3/uL (ref 150–400)

## 2013-10-30 LAB — BASIC METABOLIC PANEL
Calcium: 8.2 mg/dL — ABNORMAL LOW (ref 8.4–10.5)
Chloride: 107 mEq/L (ref 96–112)
GFR calc Af Amer: 51 mL/min — ABNORMAL LOW (ref >90)
GFR calc non Af Amer: 44 mL/min — ABNORMAL LOW (ref >90)
Glucose, Bld: 94 mg/dL (ref 70–99)
Potassium: 3.8 mEq/L (ref 3.5–5.1)
Sodium: 140 mEq/L (ref 135–145)

## 2013-10-30 LAB — VITAMIN B12: Vitamin B-12: 1269 pg/mL — ABNORMAL HIGH (ref 211–911)

## 2013-10-30 MED ORDER — PREDNISOLONE ACETATE 1 % OP SUSP
1.0000 [drp] | Freq: Every day | OPHTHALMIC | Status: DC
  Administered 2013-10-30 – 2013-11-01 (×3): 1 [drp] via OPHTHALMIC
  Filled 2013-10-30: qty 1

## 2013-10-30 MED ORDER — SODIUM CHLORIDE 0.9 % IV SOLN
INTRAVENOUS | Status: DC
  Administered 2013-10-30 – 2013-10-31 (×2): via INTRAVENOUS

## 2013-10-30 NOTE — Progress Notes (Signed)
UR chart review completed.  

## 2013-10-30 NOTE — Consult Note (Signed)
Reason for Consult: nausea and vomiting. Referring Physician: Hospitalist  DINAH LUPA is an 79 y.o. female.  HPI: Admitted thru the ED yesterday per daughter after seeing Dr. Lilyan Punt in the office. She was having headaches with nausea and vomiting.  She vomited while in the office and she was advised to go to the ED. She underwent a CT of the head which revealed: IMPRESSION:  Atrophy with small vessel chronic ischemic changes of deep cerebral  white matter.  Question tiny old lacunar infarct right thalamus.  No acute intracranial abnormalities.   While in the ED, her abdomen was examined and patient felt discomfort. She underwent a CT abdomen/pelvis:IMPRESSION:  Large nonobstructing left renal calculus.  Complicated cystic lesion of the right kidney 4.6 x 4.6 x 2.9 cm,  minimally increased in size since 2003 and containing a few more  peripheral calcifications.  Sigmoid diverticulosis.  Moderate-sized hiatal hernia.  Questionable anorectal wall thickening, recommend correlation with  physical exam and proctoscopy.  No cause for right-side abdominal pain identified.   She denies any abdominal pain today. She does have a headache. No vomiting since admission. She had a BM this pm and she had a BM while in the ED.  Daughter states her mother has been on the constipated side. There has been no fever.     She was actually admitted Friday night with a headache, nausea and vomiting and abdominal pain. She underwent a CT abdomen/pelvis 10/25/2013: IMPRESSION:  1. Again noted moderate size hiatal hernia stable from prior exam.  2. Status postcholecystectomy. Fatty replacement of the pancreas  again noted.  3. No hydronephrosis or hydroureter. Stable complex cyst within  right kidney. There is nonobstructive calcified calculus in left  renal pelvis measures 1.1 cm. No calcified ureteral calculi are  identified.  4. Sigmoid colon diverticula. No evidence of acute  diverticulitis.  5. No pericecal inflammation.   09/2008 EGD: Dr. Diamond Nickel Assessment  Abnormal examination, see findings above.  Diagnoses:  553.3: Hernia, Hiatal.  530.3: Esophageal Stricture.  535.00: Gastritis, Acute.      2003 EGD/Colonoscopy:  FINAL DIAGNOSES:  1. Small hiatal hernia without endoscopic evidence of reflux esophagitis.  2. Nonerosive gastritis involving the body and antrum.  3. Two small ulcers at the right colon, biopsy taken.  4. Two small polyps of ablated by cold biopsy from the splenic flexure.  5. Large external hemorrhoids and prominent anal papilla.    Past Medical History  Diagnosis Date  . ASCVD (arteriosclerotic cardiovascular disease) 2003    Single vessel disease-60% D1  . Hypertension   . Hyperlipidemia   . Diabetes mellitus     No insulin  . Leukopenia     and anemia-hematology evaluation was nodiagnostic  . Degenerative joint disease     right TKA in 4/06  . Upper GI bleed     gastric stress ulcer in 2003  . Retinal artery branch occlusion of right eye     Sudden onset of right eye blindness-embolism suspected  . Gastroesophageal reflux disease with hiatal hernia   . Osteoporosis   . Lung nodule     Left lower lobe  . Parkinson's disease   . Dementia   . Cervical spondylarthritis     S/p discectomy  . Alzheimer disease   . Diverticulosis   . Hiatal hernia   . Nephrolithiasis     Past Surgical History  Procedure Laterality Date  . Total abdominal hysterectomy    . Cervical discectomy  Dr. Newell Coral  . Cholecystectomy    . Total knee arthroplasty  2006    Right; Dr. Eulah Pont  . Colonoscopy  2009  . Joint replacement    . Vascular surgery    . Cystoscopy w/ ureteral stent placement  09/23/2011    Procedure: CYSTOSCOPY WITH RETROGRADE PYELOGRAM/URETERAL STENT PLACEMENT;  Surgeon: Ky Barban;  Location: [facility];  Service: Urology;  Laterality: Right;    Family History  Problem Relation Age of Onset  .  Diabetes Sister   . Heart disease Sister   . Diabetes Brother     Social History:  reports that she has never smoked. She has never used smokeless tobacco. She reports that she does not drink alcohol or use illicit drugs.  Allergies:  Allergies  Allergen Reactions  . Codeine Other (See Comments)    REACTION: Unknown  . Morphine Nausea And Vomiting    Medications: I have reviewed the patient's current medications.  Results for orders placed during the hospital encounter of 10/29/13 (from the past 48 hour(s))  CBC WITH DIFFERENTIAL     Status: Abnormal   Collection Time    10/29/13  2:39 PM      Result Value Range   WBC 12.1 (*) 4.0 - 10.5 K/uL   RBC 3.46 (*) 3.87 - 5.11 MIL/uL   Hemoglobin 11.5 (*) 12.0 - 15.0 g/dL   HCT 16.1 (*) 09.6 - 04.5 %   MCV 102.0 (*) 78.0 - 100.0 fL   MCH 33.2  26.0 - 34.0 pg   MCHC 32.6  30.0 - 36.0 g/dL   RDW 40.9  81.1 - 91.4 %   Platelets 232  150 - 400 K/uL   Neutrophils Relative % 79 (*) 43 - 77 %   Neutro Abs 9.6 (*) 1.7 - 7.7 K/uL   Lymphocytes Relative 11 (*) 12 - 46 %   Lymphs Abs 1.3  0.7 - 4.0 K/uL   Monocytes Relative 9  3 - 12 %   Monocytes Absolute 1.1 (*) 0.1 - 1.0 K/uL   Eosinophils Relative 1  0 - 5 %   Eosinophils Absolute 0.1  0.0 - 0.7 K/uL   Basophils Relative 0  0 - 1 %   Basophils Absolute 0.0  0.0 - 0.1 K/uL  COMPREHENSIVE METABOLIC PANEL     Status: Abnormal   Collection Time    10/29/13  2:39 PM      Result Value Range   Sodium 136  135 - 145 mEq/L   Potassium 4.2  3.5 - 5.1 mEq/L   Chloride 100  96 - 112 mEq/L   CO2 27  19 - 32 mEq/L   Glucose, Bld 113 (*) 70 - 99 mg/dL   BUN 17  6 - 23 mg/dL   Creatinine, Ser 7.82  0.50 - 1.10 mg/dL   Calcium 9.4  8.4 - 95.6 mg/dL   Total Protein 7.2  6.0 - 8.3 g/dL   Albumin 3.6  3.5 - 5.2 g/dL   AST 37  0 - 37 U/L   ALT 40 (*) 0 - 35 U/L   Alkaline Phosphatase 48  39 - 117 U/L   Total Bilirubin 0.2 (*) 0.3 - 1.2 mg/dL   GFR calc non Af Amer 45 (*) >90 mL/min   GFR calc  Af Amer 53 (*) >90 mL/min   Comment: (NOTE)     The eGFR has been calculated using the CKD EPI equation.     This calculation has not been  validated in all clinical situations.     eGFR's persistently <90 mL/min signify possible Chronic Kidney     Disease.  LIPASE, BLOOD     Status: None   Collection Time    10/29/13  2:39 PM      Result Value Range   Lipase 32  11 - 59 U/L  URINALYSIS, ROUTINE W REFLEX MICROSCOPIC     Status: Abnormal   Collection Time    10/29/13  4:00 PM      Result Value Range   Color, Urine YELLOW  YELLOW   APPearance CLEAR  CLEAR   Specific Gravity, Urine <1.005 (*) 1.005 - 1.030   pH 5.5  5.0 - 8.0   Glucose, UA NEGATIVE  NEGATIVE mg/dL   Hgb urine dipstick NEGATIVE  NEGATIVE   Bilirubin Urine NEGATIVE  NEGATIVE   Ketones, ur NEGATIVE  NEGATIVE mg/dL   Protein, ur NEGATIVE  NEGATIVE mg/dL   Urobilinogen, UA 0.2  0.0 - 1.0 mg/dL   Nitrite NEGATIVE  NEGATIVE   Leukocytes, UA NEGATIVE  NEGATIVE   Comment: MICROSCOPIC NOT DONE ON URINES WITH NEGATIVE PROTEIN, BLOOD, LEUKOCYTES, NITRITE, OR GLUCOSE <1000 mg/dL.  CBC     Status: Abnormal   Collection Time    10/30/13  4:58 AM      Result Value Range   WBC 4.2  4.0 - 10.5 K/uL   RBC 2.97 (*) 3.87 - 5.11 MIL/uL   Hemoglobin 10.0 (*) 12.0 - 15.0 g/dL   HCT 40.9 (*) 81.1 - 91.4 %   MCV 101.7 (*) 78.0 - 100.0 fL   MCH 33.7  26.0 - 34.0 pg   MCHC 33.1  30.0 - 36.0 g/dL   RDW 78.2  95.6 - 21.3 %   Platelets 215  150 - 400 K/uL  BASIC METABOLIC PANEL     Status: Abnormal   Collection Time    10/30/13  4:58 AM      Result Value Range   Sodium 140  135 - 145 mEq/L   Potassium 3.8  3.5 - 5.1 mEq/L   Chloride 107  96 - 112 mEq/L   CO2 25  19 - 32 mEq/L   Glucose, Bld 94  70 - 99 mg/dL   BUN 12  6 - 23 mg/dL   Creatinine, Ser 0.86  0.50 - 1.10 mg/dL   Calcium 8.2 (*) 8.4 - 10.5 mg/dL   GFR calc non Af Amer 44 (*) >90 mL/min   GFR calc Af Amer 51 (*) >90 mL/min   Comment: (NOTE)     The eGFR has been  calculated using the CKD EPI equation.     This calculation has not been validated in all clinical situations.     eGFR's persistently <90 mL/min signify possible Chronic Kidney     Disease.    Ct Head Wo Contrast  10/29/2013   CLINICAL DATA:  Vomiting, headache  EXAM: CT HEAD WITHOUT CONTRAST  TECHNIQUE: Contiguous axial images were obtained from the base of the skull through the vertex without intravenous contrast.  COMPARISON:  06/16/2013  FINDINGS: Few scattered motion artifacts.  Generalized atrophy.  Normal ventricular morphology.  No midline shift or mass effect.  Small vessel chronic ischemic changes of deep cerebral white matter.  No intracranial hemorrhage, mass lesion, or acute infarction.  Question tiny old right thalamic lacunar infarct.  Visualized paranasal sinuses and mastoid air cells clear.  Bones demineralized.  Right phthysis bulbi.  IMPRESSION: Atrophy with small vessel chronic ischemic  changes of deep cerebral white matter.  Question tiny old lacunar infarct right thalamus.  No acute intracranial abnormalities.   Electronically Signed   By: Ulyses Southward M.D.   On: 10/29/2013 13:35   Ct Abdomen Pelvis W Contrast  10/29/2013   CLINICAL DATA:  Right-side abdominal pain, vomiting, tenderness right abdomen, history diabetes, Parkinson's, Alzheimer's, hypertension  EXAM: CT ABDOMEN AND PELVIS WITH CONTRAST  TECHNIQUE: Multidetector CT imaging of the abdomen and pelvis was performed using the standard protocol following bolus administration of intravenous contrast. Sagittal and coronal MPR images reconstructed from axial data set.  CONTRAST:  OMNIPAQUE IOHEXOL 300 MG/ML SOLN. Dilute oral contrast.  COMPARISON:  1August 11, 202014, 07/25/2002  FINDINGS: Minimal scarring at lung bases.  Post cholecystectomy.  Mild diffuse fatty infiltration of liver.  Moderate-sized hiatal hernia.  Liver, spleen, pancreas, and adrenal glands otherwise normal appearance.  Nonobstructing calculus left renal pelvis,  15 x 10 x 11 mm.  Small extrarenal pelvis right kidney.  Complicated cystic lesion with calcification at the mid inferior pole of the right kidney, 4.6 x 4.6 x 2.9 cm unchanged since 1August 11, 202014 by my measurements ; in 2003 this measured 4.6 x 3.7 cm in greatest axial dimensions and now contains a few more peripheral calcifications.  No ureteral calcification or dilatation.  Bladder unremarkable.  Diverticulosis of the sigmoid colon without evidence of diverticulitis.  Normal appearing retrocecal appendix.  Questionable in anorectal wall thickening.  Stomach and bowel loops otherwise unremarkable.  Uterus surgically absent with atrophic ovaries.  No mass, adenopathy, free fluid or inflammatory process.  Bones appear demineralized with chronic superior endplate compression deformity of L1 and scattered degenerative disc/facet disease changes.  IMPRESSION: Large nonobstructing left renal calculus.  Complicated cystic lesion of the right kidney 4.6 x 4.6 x 2.9 cm, minimally increased in size since 2003 and containing a few more peripheral calcifications.  Sigmoid diverticulosis.  Moderate-sized hiatal hernia.  Questionable anorectal wall thickening, recommend correlation with physical exam and proctoscopy.  No cause for right-side abdominal pain identified.   Electronically Signed   By: Ulyses Southward M.D.   On: 10/29/2013 16:11    ROS Blood pressure 110/82, pulse 65, temperature 98.7 F (37.1 C), temperature source Axillary, resp. rate 16, height 5\' 1"  (1.549 m), weight 181 lb 10.5 oz (82.4 kg), SpO2 97.00%. Physical Exam Alert.  Skin warm and dry. Oral mucosa is moist.   . Sclera anicteric, conjunctivae is pink. Thyroid not enlarged. No cervical lymphadenopathy. Lungs clear. Heart regular rate and rhythm.  Abdomen is soft. Bowel sounds are positive. No hepatomegaly. No abdominal masses felt. No tenderness.  No edema to lower extremities.  Assessment/Plan: Abdominal pain. No abdominal pain at this time. There has  been no nausea since admission.  She has kept her diet down this admission.  Agree with with Protonix. May want to reduce dose to 40mg  daily. I will discuss with Dr. Karilyn Cota.  SETZER,TERRI W 10/30/2013, 4:19 PM   GI attending note. Patient interviewed and examined. Most of the history was obtained from patient's daughter Ms. Lu Duffel was in the room with her. Lab studies and abdominopelvic CT from 1August 11, 202014 and 10/29/2013 reviewed. Rectal examination performed. In no mass noted. She also does not have fecal impaction. Assessment; Recurrent nausea and vomiting in a patient with known history of GERD and hiatal hernia as well as history of peptic ulcer disease. Symptoms started couple of days after Thanksgiving when she was felt to have viral infection. She has mildly elevated ALT  of 40 but there is no evidence of dilated bile duct. Given her GI history peptic ulcer disease or worsening GERD need to be ruled out. She could also have gastroparesis. Recommendations; Diagnostic EGD in a.m. Repeat LFTs in a.m.

## 2013-10-30 NOTE — Progress Notes (Signed)
[facility] PROGRESS NOTE  SULEIMA OHLENDORF ZOX:[id number] DOB: [birth year] DOA: 10/29/2013 PCP: Lilyan Punt, MD  Assessment/Plan: Recurrent vomiting -Etiology unclear but suspect viral gastroenteritis versus the patient's moderate-sized hiatus hernia??gastroparesis -Continue conservative therapy with antiemetics, IV fluids, clear liquid diet -Await GI evaluation -LFTs were trended up, but bilirubin is normal -Tolerating clear liquids today -Continue PPI and Carafate Diabetes mellitus type 2 -Normally diet-controlled -Continue NovoLog sliding scale -CBGs controlled Abdominal pain -Resolved -CT abdomen pelvis negative for obstruction or other acute abnormality CKD stage III -Baseline creatinine 0.9-1.1 -Continue IV fluids Leukocytosis -Likely stressed induced from acute medical condition -UA without pyuria -Wbc's improved without antibiotics -Remains clinically stable/hemodynamically stable Headache -Likely toxic headache -CT brain negative for acute findings   Family Communication:   daughter at beside Disposition Plan:   [place] [facility] when medically stable      Procedures/Studies: Ct Abdomen Pelvis Wo Contrast  10/25/2013   CLINICAL DATA:  Nausea, vomiting, abdominal pain  EXAM: CT ABDOMEN AND PELVIS WITHOUT CONTRAST  TECHNIQUE: Multidetector CT imaging of the abdomen and pelvis was performed following the standard protocol without intravenous contrast.  COMPARISON:  11/23/2011  FINDINGS: Sagittal images of the spine shows diffuse osteopenia. Stable compression fracture of L1 vertebral body. Degenerative changes lumbar spine again noted.  Lung bases are unremarkable. Moderate size hiatal hernia again noted. Oral contrast material was given to the patient.  Unenhanced liver shows no biliary ductal dilatation. The patient is status postcholecystectomy. Fatty replaced pancreas again noted.  The spleen and adrenal glands are unremarkable. Bilateral renal cortical  thinning. Again noted complex cyst within right kidney with peripheral calcifications measures 4.3 by 3.8 cm without significant change in size in appearance from prior exam. Nonobstructive punctate calculus in upper pole of the right kidney measures 3 mm.  There is nonobstructive calculus in left renal pelvis measures 1.1 cm. No hydronephrosis or hydroureter. No calcified ureteral calculi are noted bilaterally. Bilateral distal ureter is unremarkable. Atherosclerotic calcifications of abdominal aorta and iliac arteries.  No small bowel obstruction. Unremarkable terminal ileum. No pericecal inflammation. Scattered diverticula are noted left colon. Multiple sigmoid colon diverticula are noted. No evidence of acute diverticulitis. No calcified calculi are noted within urinary bladder.  Patient is status post hysterectomy.  IMPRESSION: 1. Again noted moderate size hiatal hernia stable from prior exam. 2. Status postcholecystectomy. Fatty replacement of the pancreas again noted. 3. No hydronephrosis or hydroureter. Stable complex cyst within right kidney. There is nonobstructive calcified calculus in left renal pelvis measures 1.1 cm. No calcified ureteral calculi are identified. 4. Sigmoid colon diverticula.  No evidence of acute diverticulitis. 5. No pericecal inflammation.   Electronically Signed   By: Natasha Mead M.D.   On: 1March 27, 202014 17:07   Dg Chest 1 View  10/25/2013   CLINICAL DATA:  Vomiting, abdominal pain, diarrhea, weakness.  EXAM: CHEST - 1 VIEW  COMPARISON:  01/24/2012.  FINDINGS: The cardiac silhouette remains borderline enlarged. Interval visualization of a moderate-sized hiatal hernia. Clear lungs. Cervical spine fixation hardware.  IMPRESSION: No acute abnormality.  Moderate-sized hiatal hernia.   Electronically Signed   By: Gordan Payment M.D.   On: 1March 27, 202014 17:12   Ct Head Wo Contrast  10/29/2013   CLINICAL DATA:  Vomiting, headache  EXAM: CT HEAD WITHOUT CONTRAST  TECHNIQUE: Contiguous axial  images were obtained from the base of the skull through the vertex without intravenous contrast.  COMPARISON:  06/16/2013  FINDINGS: Few scattered motion artifacts.  Generalized atrophy.  Normal ventricular morphology.  No  midline shift or mass effect.  Small vessel chronic ischemic changes of deep cerebral white matter.  No intracranial hemorrhage, mass lesion, or acute infarction.  Question tiny old right thalamic lacunar infarct.  Visualized paranasal sinuses and mastoid air cells clear.  Bones demineralized.  Right phthysis bulbi.  IMPRESSION: Atrophy with small vessel chronic ischemic changes of deep cerebral white matter.  Question tiny old lacunar infarct right thalamus.  No acute intracranial abnormalities.   Electronically Signed   By: Ulyses Southward M.D.   On: 10/29/2013 13:35   Ct Abdomen Pelvis W Contrast  10/29/2013   CLINICAL DATA:  Right-side abdominal pain, vomiting, tenderness right abdomen, history diabetes, Parkinson's, Alzheimer's, hypertension  EXAM: CT ABDOMEN AND PELVIS WITH CONTRAST  TECHNIQUE: Multidetector CT imaging of the abdomen and pelvis was performed using the standard protocol following bolus administration of intravenous contrast. Sagittal and coronal MPR images reconstructed from axial data set.  CONTRAST:  OMNIPAQUE IOHEXOL 300 MG/ML SOLN. Dilute oral contrast.  COMPARISON:  128-Aug-202014, 07/25/2002  FINDINGS: Minimal scarring at lung bases.  Post cholecystectomy.  Mild diffuse fatty infiltration of liver.  Moderate-sized hiatal hernia.  Liver, spleen, pancreas, and adrenal glands otherwise normal appearance.  Nonobstructing calculus left renal pelvis, 15 x 10 x 11 mm.  Small extrarenal pelvis right kidney.  Complicated cystic lesion with calcification at the mid inferior pole of the right kidney, 4.6 x 4.6 x 2.9 cm unchanged since 128-Aug-202014 by my measurements ; in 2003 this measured 4.6 x 3.7 cm in greatest axial dimensions and now contains a few more peripheral  calcifications.  No ureteral calcification or dilatation.  Bladder unremarkable.  Diverticulosis of the sigmoid colon without evidence of diverticulitis.  Normal appearing retrocecal appendix.  Questionable in anorectal wall thickening.  Stomach and bowel loops otherwise unremarkable.  Uterus surgically absent with atrophic ovaries.  No mass, adenopathy, free fluid or inflammatory process.  Bones appear demineralized with chronic superior endplate compression deformity of L1 and scattered degenerative disc/facet disease changes.  IMPRESSION: Large nonobstructing left renal calculus.  Complicated cystic lesion of the right kidney 4.6 x 4.6 x 2.9 cm, minimally increased in size since 2003 and containing a few more peripheral calcifications.  Sigmoid diverticulosis.  Moderate-sized hiatal hernia.  Questionable anorectal wall thickening, recommend correlation with physical exam and proctoscopy.  No cause for right-side abdominal pain identified.   Electronically Signed   By: Ulyses Southward M.D.   On: 10/29/2013 16:11         Subjective: Patient states the headache has improved. Denies any visual changes. Denies chest discomfort, shortness breath, vomiting today. No abdominal pain. No dysuria or hematuria. No dizziness.  Objective: Filed Vitals:   10/29/13 1309 10/29/13 1830 10/29/13 2242 10/30/13 0500  BP: 128/61 136/56 117/46 139/56  Pulse: 72 72 76 66  Temp: 98.3 F (36.8 C) 97 F (36.1 C) 98.7 F (37.1 C) 98.4 F (36.9 C)  TempSrc: Oral Axillary Oral Axillary  Resp: 20  16 14   Height: 5' (1.524 m) 5\' 1"  (1.549 m)    Weight: 82.555 kg (182 lb) 82.4 kg (181 lb 10.5 oz)    SpO2: 100% 100% 94% 97%    Intake/Output Summary (Last 24 hours) at 10/30/13 1440 Last data filed at 10/30/13 1245  Gross per 24 hour  Intake   1080 ml  Output      0 ml  Net   1080 ml   Weight change:  Exam:   General:  Pt is alert, follows  commands appropriately, not in acute distress  HEENT: No icterus, No  thrush, No meningismus, [place]/AT  Cardiovascular: RRR, S1/S2, no rubs, no gallops  Respiratory: CTA bilaterally, no wheezing, no crackles, no rhonchi  Abdomen: Soft/+BS, non tender, non distended, no guarding  Extremities: trace LE edema, No lymphangitis, No petechiae, No rashes, no synovitis  Data Reviewed: Basic Metabolic Panel:  Recent Labs Lab 10/25/13 1358 10/26/13 0712 10/29/13 1439 10/30/13 0458  NA 137 140 136 140  K 4.2 4.1 4.2 3.8  CL 100 100 100 107  CO2 26 27 27 25   GLUCOSE 117* 101* 113* 94  BUN 28* 19 17 12   CREATININE 1.15* 0.99 1.07 1.10  CALCIUM 9.6 8.7 9.4 8.2*   Liver Function Tests:  Recent Labs Lab 10/25/13 1358 10/29/13 1439  AST 21 37  ALT 24 40*  ALKPHOS 50 48  BILITOT 0.2* 0.2*  PROT 8.2 7.2  ALBUMIN 4.1 3.6    Recent Labs Lab 10/25/13 1358 10/29/13 1439  LIPASE 63* 32   No results found for this basename: AMMONIA,  in the last 168 hours CBC:  Recent Labs Lab 10/25/13 1414 10/26/13 0712 10/29/13 1439 10/30/13 0458  WBC 22.2* 5.3 12.1* 4.2  NEUTROABS 19.2*  --  9.6*  --   HGB 12.4 11.1* 11.5* 10.0*  HCT 37.8 33.9* 35.3* 30.2*  MCV 102.4* 103.0* 102.0* 101.7*  PLT 268 228 232 215   Cardiac Enzymes:  Recent Labs Lab 10/25/13 1358  TROPONINI <0.30   BNP: No components found with this basename: POCBNP,  CBG:  Recent Labs Lab 10/25/13 2050 10/26/13 0746 10/26/13 1125 10/26/13 1628  GLUCAP 124* 88 117* 86    No results found for this or any previous visit (from the past 240 hour(s)).   Scheduled Meds: . aspirin EC  81 mg Oral Daily  . atropine  1 drop Right Eye BID  . citalopram  20 mg Oral Daily  . donepezil  5 mg Oral q morning - 10a  . enoxaparin (LOVENOX) injection  40 mg Subcutaneous Q24H  . losartan  25 mg Oral Daily  . memantine  10 mg Oral BID  . pantoprazole  80 mg Oral Daily  . polyethylene glycol  17 g Oral Daily  . prednisoLONE acetate  1 drop Right Eye Daily  . primidone  25 mg Oral QHS  .  sucralfate  1 g Oral TID AC & HS  . tobramycin-dexamethasone  1 drop Right Eye QID   Continuous Infusions: . sodium chloride 75 mL/hr at 10/29/13 2333     Annleigh Knueppel, DO  [facility] Pager [phone]  If 7PM-7AM, please contact night-coverage [email or web address] Password [email or web address] 10/30/2013, 2:40 PM   LOS: 1 day

## 2013-10-30 NOTE — Clinical Social Work Psychosocial (Signed)
    Clinical Social Work Department BRIEF PSYCHOSOCIAL ASSESSMENT 10/30/2013  Patient:  [name] [name], [name] [name]     Account Number:  [id number]     Admit date:  10/29/2013  Clinical Social Worker:  Santa Genera, CLINICAL SOCIAL WORKER  Date/Time:  10/30/2013 09:00 AM  Referred by:  CSW  Date Referred:  10/30/2013 Referred for  ALF Placement   Other Referral:   Interview type:  Family Other interview type:   Also spoke w [name] [name] at [facility]    PSYCHOSOCIAL DATA Living Status:  FACILITY Admitted from facility:  [facility] Level of care:  Assisted Living Primary support name:  [name] [name], [name] [name] Primary support relationship to patient:  CHILD, ADULT Degree of support available:   Children visit daily at ALF, resident of dementia unit at ALF    CURRENT CONCERNS Current Concerns  Post-Acute Placement   Other Concerns:    SOCIAL WORK ASSESSMENT / PLAN CSW met w family at patient's bedside, patient suffers from dementia and is oriented to person only, unable to participate in assessment.  Daughters confirm that patient has been resident of [address] [facility] approx 3 years and is on dementia unit.  Is retired from [employer].  Has 6 daughters, daughters say that someone from family visits patient at facility daily. Family is satisfied w care received at [facility], no concerns expressed.    CSW spoke w [name] [name] at [facility], facility confirms that patient is on dementia unit and is able to return at discharge.  Patient does not have PASARR, per [name] [name] patient is grandfathered in and does not need one. Primarily ambulates in wheelchair because patient is a high fall risk.  Requires assistance w bathing, dressing, toileting.  Needs reminders for meals and grooming.    CSW will continue to update facliity and assist w discharge planning.   Assessment/plan status:  Psychosocial Support/Ongoing Assessment of  Needs Other assessment/ plan:   Information/referral to community resources:   None needed at this time, return to [employer] [facility]    PATIENT'S/FAMILY'S RESPONSE TO PLAN OF CARE: Family satisfied w care received at [facility], wants to return.        Santa Genera, LCSW Clinical Social Worker [phone])

## 2013-10-30 NOTE — Telephone Encounter (Signed)
Opened in error

## 2013-10-30 NOTE — Care Management Note (Addendum)
    Page 1 of 1   11/01/2013     2:48:29 PM   CARE MANAGEMENT NOTE 11/01/2013  Patient:  [name], [name]   Account Number:  [id number]  Date Initiated:  10/30/2013  Documentation initiated by:  Sharrie Rothman  Subjective/Objective Assessment:   Pt admitted from [facility] with nausea and vomiting. Pt will return to facility at discharge.     Action/Plan:   CSW to arrange discharge to facility when medically stable.   Anticipated DC Date:  10/31/2013   Anticipated DC Plan:  ASSISTED LIVING / REST HOME  In-house referral  Clinical Social Worker      DC Planning Services  CM consult      Choice offered to / List presented to:             Status of service:  Completed, signed off Medicare Important Message given?   (If response is "NO", the following Medicare IM given date fields will be blank) Date Medicare IM given:   Date Additional Medicare IM given:    Discharge Disposition:  ASSISTED LIVING  Per UR Regulation:    If discussed at Long Length of Stay Meetings, dates discussed:    Comments:  11/01/13 1445 Arlyss Queen, RN BSN CM Pt discharged to [facility]. CSW to arrange discharge to facility.  10/30/13 1325 Arlyss Queen, RN BSN CM

## 2013-10-31 ENCOUNTER — Inpatient Hospital Stay (HOSPITAL_COMMUNITY): Payer: Medicare Other

## 2013-10-31 ENCOUNTER — Encounter (HOSPITAL_COMMUNITY): Payer: Self-pay | Admitting: *Deleted

## 2013-10-31 ENCOUNTER — Encounter (HOSPITAL_COMMUNITY)
Admission: EM | Disposition: A | Discharge status: Nursing Home (Includes ICF) | Payer: Self-pay | Source: Home / Self Care | Attending: Internal Medicine

## 2013-10-31 DIAGNOSIS — K449 Diaphragmatic hernia without obstruction or gangrene: Secondary | ICD-10-CM

## 2013-10-31 DIAGNOSIS — R062 Wheezing: Secondary | ICD-10-CM | POA: Diagnosis not present

## 2013-10-31 DIAGNOSIS — R1115 Cyclical vomiting syndrome unrelated to migraine: Secondary | ICD-10-CM | POA: Diagnosis not present

## 2013-10-31 DIAGNOSIS — K219 Gastro-esophageal reflux disease without esophagitis: Secondary | ICD-10-CM

## 2013-10-31 DIAGNOSIS — R109 Unspecified abdominal pain: Secondary | ICD-10-CM | POA: Diagnosis not present

## 2013-10-31 HISTORY — PX: ESOPHAGOGASTRODUODENOSCOPY: SHX5428

## 2013-10-31 LAB — GLUCOSE, CAPILLARY

## 2013-10-31 LAB — CBC
Platelets: 211 10*3/uL (ref 150–400)
RBC: 3.08 MIL/uL — ABNORMAL LOW (ref 3.87–5.11)
RDW: 13.2 % (ref 11.5–15.5)
WBC: 4.4 10*3/uL (ref 4.0–10.5)

## 2013-10-31 LAB — COMPREHENSIVE METABOLIC PANEL
ALT: 27 U/L (ref 0–35)
AST: 22 U/L (ref 0–37)
Albumin: 3 g/dL — ABNORMAL LOW (ref 3.5–5.2)
CO2: 25 mEq/L (ref 19–32)
Chloride: 105 mEq/L (ref 96–112)
Creatinine, Ser: 1.07 mg/dL (ref 0.50–1.10)
GFR calc non Af Amer: 45 mL/min — ABNORMAL LOW (ref >90)
Potassium: 3.5 mEq/L (ref 3.5–5.1)
Sodium: 139 mEq/L (ref 135–145)
Total Bilirubin: 0.3 mg/dL (ref 0.3–1.2)

## 2013-10-31 SURGERY — EGD (ESOPHAGOGASTRODUODENOSCOPY)
Anesthesia: Moderate Sedation

## 2013-10-31 MED ORDER — SIMETHICONE 40 MG/0.6ML PO SUSP
ORAL | Status: DC | PRN
  Administered 2013-10-31: 16:00:00

## 2013-10-31 MED ORDER — BUTAMBEN-TETRACAINE-BENZOCAINE 2-2-14 % EX AERO
INHALATION_SPRAY | CUTANEOUS | Status: DC | PRN
  Administered 2013-10-31: 2 via TOPICAL

## 2013-10-31 MED ORDER — ALBUTEROL SULFATE (5 MG/ML) 0.5% IN NEBU
2.5000 mg | INHALATION_SOLUTION | Freq: Four times a day (QID) | RESPIRATORY_TRACT | Status: DC
  Administered 2013-10-31 – 2013-11-01 (×4): 2.5 mg via RESPIRATORY_TRACT
  Filled 2013-10-31 (×5): qty 0.5

## 2013-10-31 MED ORDER — MIDAZOLAM HCL 5 MG/5ML IJ SOLN
INTRAMUSCULAR | Status: DC | PRN
  Administered 2013-10-31: 1 mg via INTRAVENOUS
  Administered 2013-10-31: 2 mg via INTRAVENOUS

## 2013-10-31 MED ORDER — PANTOPRAZOLE SODIUM 40 MG PO TBEC
40.0000 mg | DELAYED_RELEASE_TABLET | Freq: Two times a day (BID) | ORAL | Status: DC
  Administered 2013-10-31 – 2013-11-01 (×2): 40 mg via ORAL
  Filled 2013-10-31 (×2): qty 1

## 2013-10-31 MED ORDER — MEPERIDINE HCL 50 MG/ML IJ SOLN
INTRAMUSCULAR | Status: AC
  Filled 2013-10-31: qty 1

## 2013-10-31 MED ORDER — MIDAZOLAM HCL 5 MG/5ML IJ SOLN
INTRAMUSCULAR | Status: AC
  Filled 2013-10-31: qty 10

## 2013-10-31 NOTE — Op Note (Signed)
EGD PROCEDURE REPORT  PATIENT:  [name] [name]  MR#:  [id number] Birthdate:  12-18-1923, 79 y.o., female Endoscopist:  Dr. Malissa Hippo, MD Referred By:  Dr. Dr. Catarina Hartshorn, MD Procedure Date: 10/31/2013  Procedure:   EGD  Indications:  Patient is an elderly female with multiple medical problems including dementia who has a recurrent vomiting. She has undergone two abdominal pelvic CTs. Her ALT was mildly abnormal but the normal today. She does not have evidence of biliary obstruction. She is undergoing diagnostic EGD. She has chronic GERD with known hiatal hernia and history of peptic ulcer disease. Informed consent obtained from her daughter Ms. Lu Duffel who has POA            Informed Consent:  The risks, benefits, alternatives & imponderables which include, but are not limited to, bleeding, infection, perforation, drug reaction and potential missed lesion have been reviewed.  The potential for biopsy, lesion removal, esophageal dilation, etc. have also been discussed.  Questions have been answered.  All parties agreeable.  Please see history & physical in medical record for more information.  Medications:   Versed 3 mg IV Cetacaine spray topically for oropharyngeal anesthesia  Description of procedure:  The endoscope was introduced through the mouth and advanced to the second portion of the duodenum without difficulty or limitations. The mucosal surfaces were surveyed very carefully during advancement of the scope and upon withdrawal.  Findings:  Esophagus:  Mucosa of the esophagus was normal. GE junction was unremarkable. GEJ:  31 cm Hiatus:  38 cm Stomach:  Stomach was empty and distended very well with insufflation. Folds in the proximal stomach are normal. Examination of mucosa at gastric body, antrum, pyloric channel, angularis, fundus and cardia was normal. Hernia was easily identified on retroflexed view. Duodenum:  Normal bulbar and post bulbar  mucosa.  Therapeutic/Diagnostic Maneuvers Performed:  None  Complications:  None  Impression: Large sliding hiatal hernia otherwise normal EGD.  Comment; Patient symptoms may be triggered secondary to GE reflux but need to rule out pharyngeal dysfunction. Speech therapy evaluation requested but may be difficult on account of her dementia.   Recommendations:  Change pantoprazole to 40 mg by mouth twice a day. Keep HOB at 30 at all times. Mechanical soft diet. SLP consultation   REHMAN,NAJEEB U  10/31/2013  4:30 PM  CC: Dr. Lilyan Punt, MD & Dr. Bonnetta Barry ref. provider found

## 2013-10-31 NOTE — Progress Notes (Signed)
Brief visit for emotional/spiritual suport.

## 2013-10-31 NOTE — Discharge Summary (Addendum)
Physician Discharge Summary  GLADYCE MCRAY [name]:[id number] DOB: 1924-11-02 DOA: 10/29/2013  PCP: Lilyan Punt, MD  Admit date: 10/29/2013 Discharge date: 11/01/2013  Recommendations for Outpatient Follow-up:  1. Pt will need to follow up with PCP in 2 weeks post discharge 2. Please obtain BMP to evaluate electrolytes and kidney function 3. Please also check CBC to evaluate Hg and Hct levels 4. Please change dressing on left pretibial wound daily and apply triple antibiotic ointment   Discharge Diagnoses:  Active Problems:   HYPERTENSION   Type II diabetes mellitus   Senile dementia   GERD (gastroesophageal reflux disease)   Leukocytosis   CKD (chronic kidney disease) stage 3, GFR 30-59 ml/min   Abdominal pain   Vomiting   Headache   Recurrent vomiting   Persistent recurrent vomiting Recurrent vomiting  -Etiology multifactorial--suspect viral gastroenteritis versus the patient's moderate-sized hiatus hernia??gastroparesis/reflux and possibly dysphagia-->coughing induced vomiting  -Continue conservative therapy with antiemetics, IV fluids, clear liquid diet  -Appreciate GI evaluation  -LFTs improved  -swallow evaluation--daughter gives hx of coughing with meals  -Continue PPI and Carafate  -EGD 10/31/13 shows large hiatus hernia without any ulcerations or bleeding  -case discussed with Dr. Karilyn Cota  -Speech therapy evaluation recommended mechanical soft diet with thin liquids--he also recommended a modified barium swallow to the patient continued to have recurrent vomiting issues -I have advised the patient's daughter to ensure that the patient does not immediately go back to bed after meals. I advised her to avoid lying down for at least post meals, longer if tolerated  -Please to not allow patient to go back to bed or lie down supine for at least 60 minutes after her meals -The patient's diet was advanced to a mechanical soft diet which she tolerated without any abdominal  pain or vomiting Diabetes mellitus type 2  -Normally diet-controlled  -Continue NovoLog sliding scale  -CBGs controlled  Abdominal pain  -Resolved  -CT abdomen pelvis negative for obstruction or other acute abnormality  -Urinalysis negative for pyuria CKD stage III  -Baseline creatinine 0.9-1.1  -Continue IV fluids  -Serum creatinine 1.07 on 11/01/1999 410 Leukocytosis  -Likely stressed induced from acute medical condition-->resolved  -UA without pyuria  -Wbc's improved without antibiotics  -Remains clinically stable/hemodynamically stable and afebrile Headache  -Likely toxic headache  -CT brain negative for acute findings  -Resolved during the hospitalization Bronchospasm The patient had some mild wheezing during the hospitalization -This may be related to the patient's coughing as well as her reflux, hiatus hernia, and possibly dysphagia -The patient was started on albuterol nebulizers -She will continue on albuterol MDI every 6 hrs prn sob or wheeze at SNF -The patient never had any respiratory distress or oxygen desaturation -Chest x-ray on 10/31/2013 was negative for any acute cardiopulmonary process Family Communication: daughter at beside  Disposition Plan: [facility] when medically stable, likely 11/01/13   Discharge Condition: Stable  Disposition:  skilled nursing facility  Diet: Mechanical soft diet with thin liquids Wt Readings from Last 3 Encounters:  10/29/13 82.4 kg (181 lb 10.5 oz)  10/29/13 82.4 kg (181 lb 10.5 oz)  10/25/13 82.8 kg (182 lb 8.7 oz)    History of present illness:  79 year old female who is a resident at [facility], recently discharged from the hospital on 10/26/2013 for abdominal pain, nausea, and vomiting. It was felt that she likely had a viral gastroenteritis at that time. She improved clinically within 24 hours and was discharged in stable condition. Unfortunately, the patient began  developing abdominal pain and vomiting on  10/28/2013. There was no diarrhea, hematochezia, melena, fevers, chills. She went to see her primary care physician where she vomited in the office. The patient was sent to the ED for further evaluation. CT of the head was negative for any acute findings. CT of the abdomen and pelvis was essentially unremarkable without any acute findings. Urinalysis was unremarkable. Hepatic panel and lipase were negative. Gastroenterology was consulted to see the patient. The patient was made n.p.o. Her diet was gradually advanced to a clear liquid diet. The patient underwent EGD on 10/31/2013. It showed a large sliding hiatus hernia without any ulcerations or bleeding. It was felt that the patient's vomiting and abdominal pain is likely multifactorial including but not limited to the patient's gastroesophageal reflux disease in combination with her large hiatus hernia as well as the possibility of viral gastroenteritis and dysphagia which is causing her coughing to the point where she vomits. Speech therapy was consulted to evaluate her swallowing prior to her discharge. The patient did not exhibit any more vomiting. Her abdominal pain subsided. Her diet was advanced. Speech therapy recommended a dysphagia 3 diet/mechanical soft diet with thin liquids. She tolerated without difficulty.  Consultants: Gastroenterology  Discharge Exam: Filed Vitals:   11/01/13 0539  BP: 158/94  Pulse:   Temp:   Resp:    Filed Vitals:   11/01/13 0517 11/01/13 0539 11/01/13 1050 11/01/13 1432  BP: 150/105 158/94    Pulse: 71     Temp: 98.1 F (36.7 C)     TempSrc: Oral     Resp: 20     Height:      Weight:      SpO2: 94%  92% 93%   General: Alert and awake, NAD, pleasant, cooperative Cardiovascular: RRR, no rub, no gallop, no S3 Respiratory: Minimal basilar wheezing. No rhonchi. Good air movement.  Abdomen:soft, nontender, nondistended, positive bowel sounds Extremities: trace LE edema, No lymphangitis, no  petechiae  Discharge Instructions       Future Appointments Provider Department Dept Phone   12/13/2013 1:30 PM Babs Sciara, MD [facility] [phone]   12/20/2013 12:00 PM Huston Foley, MD [facility] [phone]   12/24/2013 2:30 PM Malissa Hippo, MD  [facility] [phone]       Medication List         acetaminophen 500 MG tablet  Commonly known as:  TYLENOL  Take 500 mg by mouth 2 (two) times daily. *May take one tablet daily as needed for headache*     albuterol 108 (90 BASE) MCG/ACT inhaler  Commonly known as:  PROAIR HFA  Inhale 2 puffs into the lungs every 4 (four) hours as needed for wheezing or shortness of breath.     ARTIFICIAL TEARS 1.4 % ophthalmic solution  Generic drug:  polyvinyl alcohol  Place 1 drop into both eyes every 8 (eight) hours as needed for dry eyes.     aspirin EC 81 MG tablet  Take 81 mg by mouth daily.     atropine 1 % ophthalmic solution  Place 1 drop into the right eye 2 (two) times daily.     citalopram 20 MG tablet  Commonly known as:  CELEXA  Take 20 mg by mouth daily.     clotrimazole-betamethasone cream  Commonly known as:  LOTRISONE  Apply 1 application topically daily as needed.     dextrose 40 % Gel  Commonly known as:  GLUTOSE  Take  1 Tube by mouth once as needed for low blood sugar (for sugar levels less than 60).     donepezil 5 MG tablet  Commonly known as:  ARICEPT  Take 5 mg by mouth every morning.     DUREZOL 0.05 % Emul  Generic drug:  Difluprednate  Place 1 drop into the right eye daily.     HYDROcodone-acetaminophen 5-325 MG per tablet  Commonly known as:  NORCO/VICODIN  Take 1 tablet by mouth every 6 (six) hours as needed for moderate pain.     loratadine 10 MG tablet  Commonly known as:  CLARITIN  Take 10 mg by mouth daily.     losartan 25 MG tablet  Commonly known as:  COZAAR  Take 25 mg by mouth daily.     memantine 10 MG tablet   Commonly known as:  NAMENDA  Take 10 mg by mouth 2 (two) times daily.     methylcellulose 1 % ophthalmic solution  Commonly known as:  ARTIFICIAL TEARS  Place 1 drop into both eyes 3 (three) times daily.     mometasone 0.1 % cream  Commonly known as:  ELOCON  Apply 1 application topically 2 (two) times daily as needed (for irritation).     omeprazole 40 MG capsule  Commonly known as:  PRILOSEC  Take 1 capsule (40 mg total) by mouth 2 (two) times daily.     ondansetron 4 MG disintegrating tablet  Commonly known as:  ZOFRAN-ODT  Take 4 mg by mouth. Take one every 6 hours as needed for nausea     polyethylene glycol packet  Commonly known as:  MIRALAX / GLYCOLAX  Take 17 g by mouth daily. *Mixed with 8 ounces of water/juice daily*     primidone 50 MG tablet  Commonly known as:  MYSOLINE  Take 0.5 tablets (25 mg total) by mouth at bedtime.     sucralfate 1 G tablet  Commonly known as:  CARAFATE  Take 1 g by mouth 4 (four) times daily. *to be crushed in small amount of liquid*     tobramycin-dexamethasone ophthalmic solution  Commonly known as:  TOBRADEX  Place 1 drop into the right eye 4 (four) times daily.     VITAMIN B COMPLEX-C Caps  Take 1 capsule by mouth daily.     vitamin E 400 UNIT capsule  Take 400 Units by mouth daily.         The results of significant diagnostics from this hospitalization (including imaging, microbiology, ancillary and laboratory) are listed below for reference.    Significant Diagnostic Studies: Ct Abdomen Pelvis Wo Contrast  10/25/2013   CLINICAL DATA:  Nausea, vomiting, abdominal pain  EXAM: CT ABDOMEN AND PELVIS WITHOUT CONTRAST  TECHNIQUE: Multidetector CT imaging of the abdomen and pelvis was performed following the standard protocol without intravenous contrast.  COMPARISON:  11/23/2011  FINDINGS: Sagittal images of the spine shows diffuse osteopenia. Stable compression fracture of L1 vertebral body. Degenerative changes lumbar spine  again noted.  Lung bases are unremarkable. Moderate size hiatal hernia again noted. Oral contrast material was given to the patient.  Unenhanced liver shows no biliary ductal dilatation. The patient is status postcholecystectomy. Fatty replaced pancreas again noted.  The spleen and adrenal glands are unremarkable. Bilateral renal cortical thinning. Again noted complex cyst within right kidney with peripheral calcifications measures 4.3 by 3.8 cm without significant change in size in appearance from prior exam. Nonobstructive punctate calculus in upper pole of the right kidney measures  3 mm.  There is nonobstructive calculus in left renal pelvis measures 1.1 cm. No hydronephrosis or hydroureter. No calcified ureteral calculi are noted bilaterally. Bilateral distal ureter is unremarkable. Atherosclerotic calcifications of abdominal aorta and iliac arteries.  No small bowel obstruction. Unremarkable terminal ileum. No pericecal inflammation. Scattered diverticula are noted left colon. Multiple sigmoid colon diverticula are noted. No evidence of acute diverticulitis. No calcified calculi are noted within urinary bladder.  Patient is status post hysterectomy.  IMPRESSION: 1. Again noted moderate size hiatal hernia stable from prior exam. 2. Status postcholecystectomy. Fatty replacement of the pancreas again noted. 3. No hydronephrosis or hydroureter. Stable complex cyst within right kidney. There is nonobstructive calcified calculus in left renal pelvis measures 1.1 cm. No calcified ureteral calculi are identified. 4. Sigmoid colon diverticula.  No evidence of acute diverticulitis. 5. No pericecal inflammation.   Electronically Signed   By: Natasha Mead M.D.   On: 1Jul 04, 202014 17:07   Dg Chest 1 View  10/25/2013   CLINICAL DATA:  Vomiting, abdominal pain, diarrhea, weakness.  EXAM: CHEST - 1 VIEW  COMPARISON:  01/24/2012.  FINDINGS: The cardiac silhouette remains borderline enlarged. Interval visualization of a  moderate-sized hiatal hernia. Clear lungs. Cervical spine fixation hardware.  IMPRESSION: No acute abnormality.  Moderate-sized hiatal hernia.   Electronically Signed   By: Gordan Payment M.D.   On: 1Jul 04, 202014 17:12   Ct Head Wo Contrast  10/29/2013   CLINICAL DATA:  Vomiting, headache  EXAM: CT HEAD WITHOUT CONTRAST  TECHNIQUE: Contiguous axial images were obtained from the base of the skull through the vertex without intravenous contrast.  COMPARISON:  06/16/2013  FINDINGS: Few scattered motion artifacts.  Generalized atrophy.  Normal ventricular morphology.  No midline shift or mass effect.  Small vessel chronic ischemic changes of deep cerebral white matter.  No intracranial hemorrhage, mass lesion, or acute infarction.  Question tiny old right thalamic lacunar infarct.  Visualized paranasal sinuses and mastoid air cells clear.  Bones demineralized.  Right phthysis bulbi.  IMPRESSION: Atrophy with small vessel chronic ischemic changes of deep cerebral white matter.  Question tiny old lacunar infarct right thalamus.  No acute intracranial abnormalities.   Electronically Signed   By: Ulyses Southward M.D.   On: 10/29/2013 13:35   Ct Abdomen Pelvis W Contrast  10/29/2013   CLINICAL DATA:  Right-side abdominal pain, vomiting, tenderness right abdomen, history diabetes, Parkinson's, Alzheimer's, hypertension  EXAM: CT ABDOMEN AND PELVIS WITH CONTRAST  TECHNIQUE: Multidetector CT imaging of the abdomen and pelvis was performed using the standard protocol following bolus administration of intravenous contrast. Sagittal and coronal MPR images reconstructed from axial data set.  CONTRAST:  OMNIPAQUE IOHEXOL 300 MG/ML SOLN. Dilute oral contrast.  COMPARISON:  1Jul 04, 202014, 07/25/2002  FINDINGS: Minimal scarring at lung bases.  Post cholecystectomy.  Mild diffuse fatty infiltration of liver.  Moderate-sized hiatal hernia.  Liver, spleen, pancreas, and adrenal glands otherwise normal appearance.  Nonobstructing calculus  left renal pelvis, 15 x 10 x 11 mm.  Small extrarenal pelvis right kidney.  Complicated cystic lesion with calcification at the mid inferior pole of the right kidney, 4.6 x 4.6 x 2.9 cm unchanged since 1Jul 04, 202014 by my measurements ; in 2003 this measured 4.6 x 3.7 cm in greatest axial dimensions and now contains a few more peripheral calcifications.  No ureteral calcification or dilatation.  Bladder unremarkable.  Diverticulosis of the sigmoid colon without evidence of diverticulitis.  Normal appearing retrocecal appendix.  Questionable in anorectal wall thickening.  Stomach and bowel loops otherwise unremarkable.  Uterus surgically absent with atrophic ovaries.  No mass, adenopathy, free fluid or inflammatory process.  Bones appear demineralized with chronic superior endplate compression deformity of L1 and scattered degenerative disc/facet disease changes.  IMPRESSION: Large nonobstructing left renal calculus.  Complicated cystic lesion of the right kidney 4.6 x 4.6 x 2.9 cm, minimally increased in size since 2003 and containing a few more peripheral calcifications.  Sigmoid diverticulosis.  Moderate-sized hiatal hernia.  Questionable anorectal wall thickening, recommend correlation with physical exam and proctoscopy.  No cause for right-side abdominal pain identified.   Electronically Signed   By: Ulyses Southward M.D.   On: 10/29/2013 16:11   Dg Chest Port 1 View  10/31/2013   CLINICAL DATA:  Wheezing, history of hypertension  EXAM: PORTABLE CHEST - 1 VIEW  COMPARISON:  105-08-2013  FINDINGS: Markedly shallow lung volumes. The technique taking into consideration there is no evidence of focal infiltrates, effusions, nor edema. Cardiac silhouette in the upper limits of normal. The aortic is ectatic. Osteoarthritic changes appreciated involving the acromioclavicular joints.  IMPRESSION: Shallow inspiration without evidence of acute cardiopulmonary disease.   Electronically Signed   By: Salome Holmes M.D.   On:  10/31/2013 11:14     Microbiology: No results found for this or any previous visit (from the past 240 hour(s)).   Labs: Basic Metabolic Panel:  Recent Labs Lab 10/26/13 0712 10/29/13 1439 10/30/13 0458 10/31/13 0509  NA 140 136 140 139  K 4.1 4.2 3.8 3.5  CL 100 100 107 105  CO2 27 27 25 25   GLUCOSE 101* 113* 94 95  BUN 19 17 12 6   CREATININE 0.99 1.07 1.10 1.07  CALCIUM 8.7 9.4 8.2* 8.7   Liver Function Tests:  Recent Labs Lab 10/29/13 1439 10/31/13 0509  AST 37 22  ALT 40* 27  ALKPHOS 48 39  BILITOT 0.2* 0.3  PROT 7.2 5.9*  ALBUMIN 3.6 3.0*    Recent Labs Lab 10/29/13 1439  LIPASE 32   No results found for this basename: AMMONIA,  in the last 168 hours CBC:  Recent Labs Lab 10/26/13 0712 10/29/13 1439 10/30/13 0458 10/31/13 0509  WBC 5.3 12.1* 4.2 4.4  NEUTROABS  --  9.6*  --   --   HGB 11.1* 11.5* 10.0* 10.4*  HCT 33.9* 35.3* 30.2* 31.5*  MCV 103.0* 102.0* 101.7* 102.3*  PLT 228 232 215 211   Cardiac Enzymes: No results found for this basename: CKTOTAL, CKMB, CKMBINDEX, TROPONINI,  in the last 168 hours BNP: No components found with this basename: POCBNP,  CBG:  Recent Labs Lab 10/25/13 2050 10/26/13 0746 10/26/13 1125 10/26/13 1628 10/31/13 1538  GLUCAP 124* 88 117* 86 101*    Time coordinating discharge:  Greater than 30 minutes  Signed:  Milo Schreier, DO [facility] Pager: [phone] 11/01/2013, 2:43 PM

## 2013-10-31 NOTE — Progress Notes (Signed)
[facility] PROGRESS NOTE  [name] [name] VHQ:[id number] DOB: 06-Dec-1923 DOA: 10/29/2013 PCP: Lilyan Punt, MD  Assessment/Plan: Recurrent vomiting  -Etiology multifactorial--suspect viral gastroenteritis versus the patient's moderate-sized hiatus hernia??gastroparesis/reflux and possibly dysphagia-->coughing induced vomiting  -Continue conservative therapy with antiemetics, IV fluids, clear liquid diet  -Appreciate GI evaluation  -LFTs improved  -swallow evaluation--daughter gives hx of coughing with meals -Continue PPI and Carafate  -EGD 10/31/13 shows large hiatus hernia without any ulcerations or bleeding -case discussed with Dr. Karilyn Cota -mechanical soft diet until evaluated by speech therapy -I have advised the patient's daughter to ensure that the patient does not immediately go back to bed after meals. I advised her to avoid lying down for at least post meals, longer if tolerated Diabetes mellitus type 2  -Normally diet-controlled  -Continue NovoLog sliding scale  -CBGs controlled  Abdominal pain  -Resolved  -CT abdomen pelvis negative for obstruction or other acute abnormality  CKD stage III  -Baseline creatinine 0.9-1.1  -Continue IV fluids  Leukocytosis  -Likely stressed induced from acute medical condition-->resolved -UA without pyuria  -Wbc's improved without antibiotics  -Remains clinically stable/hemodynamically stable  Headache  -Likely toxic headache  -CT brain negative for acute findings  Family Communication: daughter at beside  Disposition Plan: [facility] when medically stable, likely 11/01/13          Procedures/Studies: Ct Abdomen Pelvis Wo Contrast  10/25/2013   CLINICAL DATA:  Nausea, vomiting, abdominal pain  EXAM: CT ABDOMEN AND PELVIS WITHOUT CONTRAST  TECHNIQUE: Multidetector CT imaging of the abdomen and pelvis was performed following the standard protocol without intravenous contrast.  COMPARISON:  11/23/2011  FINDINGS:  Sagittal images of the spine shows diffuse osteopenia. Stable compression fracture of L1 vertebral body. Degenerative changes lumbar spine again noted.  Lung bases are unremarkable. Moderate size hiatal hernia again noted. Oral contrast material was given to the patient.  Unenhanced liver shows no biliary ductal dilatation. The patient is status postcholecystectomy. Fatty replaced pancreas again noted.  The spleen and adrenal glands are unremarkable. Bilateral renal cortical thinning. Again noted complex cyst within right kidney with peripheral calcifications measures 4.3 by 3.8 cm without significant change in size in appearance from prior exam. Nonobstructive punctate calculus in upper pole of the right kidney measures 3 mm.  There is nonobstructive calculus in left renal pelvis measures 1.1 cm. No hydronephrosis or hydroureter. No calcified ureteral calculi are noted bilaterally. Bilateral distal ureter is unremarkable. Atherosclerotic calcifications of abdominal aorta and iliac arteries.  No small bowel obstruction. Unremarkable terminal ileum. No pericecal inflammation. Scattered diverticula are noted left colon. Multiple sigmoid colon diverticula are noted. No evidence of acute diverticulitis. No calcified calculi are noted within urinary bladder.  Patient is status post hysterectomy.  IMPRESSION: 1. Again noted moderate size hiatal hernia stable from prior exam. 2. Status postcholecystectomy. Fatty replacement of the pancreas again noted. 3. No hydronephrosis or hydroureter. Stable complex cyst within right kidney. There is nonobstructive calcified calculus in left renal pelvis measures 1.1 cm. No calcified ureteral calculi are identified. 4. Sigmoid colon diverticula.  No evidence of acute diverticulitis. 5. No pericecal inflammation.   Electronically Signed   By: Natasha Mead M.D.   On: 107/12/202014 17:07   Dg Chest 1 View  10/25/2013   CLINICAL DATA:  Vomiting, abdominal pain, diarrhea, weakness.  EXAM:  CHEST - 1 VIEW  COMPARISON:  01/24/2012.  FINDINGS: The cardiac silhouette remains borderline enlarged. Interval visualization of a moderate-sized hiatal hernia. Clear lungs. Cervical spine fixation  hardware.  IMPRESSION: No acute abnormality.  Moderate-sized hiatal hernia.   Electronically Signed   By: Gordan Payment M.D.   On: 12020/06/1313 17:12   Ct Head Wo Contrast  10/29/2013   CLINICAL DATA:  Vomiting, headache  EXAM: CT HEAD WITHOUT CONTRAST  TECHNIQUE: Contiguous axial images were obtained from the base of the skull through the vertex without intravenous contrast.  COMPARISON:  06/16/2013  FINDINGS: Few scattered motion artifacts.  Generalized atrophy.  Normal ventricular morphology.  No midline shift or mass effect.  Small vessel chronic ischemic changes of deep cerebral white matter.  No intracranial hemorrhage, mass lesion, or acute infarction.  Question tiny old right thalamic lacunar infarct.  Visualized paranasal sinuses and mastoid air cells clear.  Bones demineralized.  Right phthysis bulbi.  IMPRESSION: Atrophy with small vessel chronic ischemic changes of deep cerebral white matter.  Question tiny old lacunar infarct right thalamus.  No acute intracranial abnormalities.   Electronically Signed   By: Ulyses Southward M.D.   On: 10/29/2013 13:35   Ct Abdomen Pelvis W Contrast  10/29/2013   CLINICAL DATA:  Right-side abdominal pain, vomiting, tenderness right abdomen, history diabetes, Parkinson's, Alzheimer's, hypertension  EXAM: CT ABDOMEN AND PELVIS WITH CONTRAST  TECHNIQUE: Multidetector CT imaging of the abdomen and pelvis was performed using the standard protocol following bolus administration of intravenous contrast. Sagittal and coronal MPR images reconstructed from axial data set.  CONTRAST:  OMNIPAQUE IOHEXOL 300 MG/ML SOLN. Dilute oral contrast.  COMPARISON:  12020/06/1313, 07/25/2002  FINDINGS: Minimal scarring at lung bases.  Post cholecystectomy.  Mild diffuse fatty infiltration of liver.   Moderate-sized hiatal hernia.  Liver, spleen, pancreas, and adrenal glands otherwise normal appearance.  Nonobstructing calculus left renal pelvis, 15 x 10 x 11 mm.  Small extrarenal pelvis right kidney.  Complicated cystic lesion with calcification at the mid inferior pole of the right kidney, 4.6 x 4.6 x 2.9 cm unchanged since 12020/06/1313 by my measurements ; in 2003 this measured 4.6 x 3.7 cm in greatest axial dimensions and now contains a few more peripheral calcifications.  No ureteral calcification or dilatation.  Bladder unremarkable.  Diverticulosis of the sigmoid colon without evidence of diverticulitis.  Normal appearing retrocecal appendix.  Questionable in anorectal wall thickening.  Stomach and bowel loops otherwise unremarkable.  Uterus surgically absent with atrophic ovaries.  No mass, adenopathy, free fluid or inflammatory process.  Bones appear demineralized with chronic superior endplate compression deformity of L1 and scattered degenerative disc/facet disease changes.  IMPRESSION: Large nonobstructing left renal calculus.  Complicated cystic lesion of the right kidney 4.6 x 4.6 x 2.9 cm, minimally increased in size since 2003 and containing a few more peripheral calcifications.  Sigmoid diverticulosis.  Moderate-sized hiatal hernia.  Questionable anorectal wall thickening, recommend correlation with physical exam and proctoscopy.  No cause for right-side abdominal pain identified.   Electronically Signed   By: Ulyses Southward M.D.   On: 10/29/2013 16:11   Dg Chest Port 1 View  10/31/2013   CLINICAL DATA:  Wheezing, history of hypertension  EXAM: PORTABLE CHEST - 1 VIEW  COMPARISON:  12020/06/1313  FINDINGS: Markedly shallow lung volumes. The technique taking into consideration there is no evidence of focal infiltrates, effusions, nor edema. Cardiac silhouette in the upper limits of normal. The aortic is ectatic. Osteoarthritic changes appreciated involving the acromioclavicular joints.  IMPRESSION:  Shallow inspiration without evidence of acute cardiopulmonary disease.   Electronically Signed   By: Salome Holmes M.D.   On:  10/31/2013 11:14         Subjective: Patient is pleasantly confused. No further vomiting or abdominal pain. Patient denies chest pain or shortness of breath. No diarrhea or respiratory distress reported.  Objective: Filed Vitals:   10/31/13 1600 10/31/13 1605 10/31/13 1610 10/31/13 1615  BP: 153/109 128/109 140/91 109/96  Pulse: 69 61 74 71  Temp:      TempSrc:      Resp: 28 16 18 21   Height:      Weight:      SpO2: 95% 98% 97% 97%    Intake/Output Summary (Last 24 hours) at 10/31/13 1705 Last data filed at 10/31/13 1300  Gross per 24 hour  Intake     60 ml  Output    300 ml  Net   -240 ml   Weight change:  Exam:   General:  Pt is alert, follows commands appropriately, not in acute distress  HEENT: No icterus, No thrush, No meningismus, [place]/AT  Cardiovascular: RRR, S1/S2, no rubs, no gallops  Respiratory: CTA bilaterally, no wheezing, no crackles, no rhonchi  Abdomen: Soft/+BS, non tender, non distended, no guarding  Extremities: trace LE edema, No lymphangitis, No petechiae, No rashes, no synovitis  Data Reviewed: Basic Metabolic Panel:  Recent Labs Lab 10/25/13 1358 10/26/13 0712 10/29/13 1439 10/30/13 0458 10/31/13 0509  NA 137 140 136 140 139  K 4.2 4.1 4.2 3.8 3.5  CL 100 100 100 107 105  CO2 26 27 27 25 25   GLUCOSE 117* 101* 113* 94 95  BUN 28* 19 17 12 6   CREATININE 1.15* 0.99 1.07 1.10 1.07  CALCIUM 9.6 8.7 9.4 8.2* 8.7   Liver Function Tests:  Recent Labs Lab 10/25/13 1358 10/29/13 1439 10/31/13 0509  AST 21 37 22  ALT 24 40* 27  ALKPHOS 50 48 39  BILITOT 0.2* 0.2* 0.3  PROT 8.2 7.2 5.9*  ALBUMIN 4.1 3.6 3.0*    Recent Labs Lab 10/25/13 1358 10/29/13 1439  LIPASE 63* 32   No results found for this basename: AMMONIA,  in the last 168 hours CBC:  Recent Labs Lab 10/25/13 1414 10/26/13 0712  10/29/13 1439 10/30/13 0458 10/31/13 0509  WBC 22.2* 5.3 12.1* 4.2 4.4  NEUTROABS 19.2*  --  9.6*  --   --   HGB 12.4 11.1* 11.5* 10.0* 10.4*  HCT 37.8 33.9* 35.3* 30.2* 31.5*  MCV 102.4* 103.0* 102.0* 101.7* 102.3*  PLT 268 228 232 215 211   Cardiac Enzymes:  Recent Labs Lab 10/25/13 1358  TROPONINI <0.30   BNP: No components found with this basename: POCBNP,  CBG:  Recent Labs Lab 10/25/13 2050 10/26/13 0746 10/26/13 1125 10/26/13 1628 10/31/13 1538  GLUCAP 124* 88 117* 86 101*    No results found for this or any previous visit (from the past 240 hour(s)).   Scheduled Meds: . albuterol  2.5 mg Nebulization Q6H  . aspirin EC  81 mg Oral Daily  . atropine  1 drop Right Eye BID  . citalopram  20 mg Oral Daily  . donepezil  5 mg Oral q morning - 10a  . enoxaparin (LOVENOX) injection  40 mg Subcutaneous Q24H  . losartan  25 mg Oral Daily  . memantine  10 mg Oral BID  . meperidine      . midazolam      . pantoprazole  40 mg Oral BID AC  . polyethylene glycol  17 g Oral Daily  . prednisoLONE acetate  1 drop Right  Eye Daily  . primidone  25 mg Oral QHS  . sucralfate  1 g Oral TID AC & HS  . tobramycin-dexamethasone  1 drop Right Eye QID   Continuous Infusions: . sodium chloride 75 mL/hr at 10/29/13 2333     Rozanna Cormany, DO  [facility] Pager [phone]  If 7PM-7AM, please contact night-coverage [email or web address] Password [email or web address] 10/31/2013, 5:05 PM   LOS: 2 days

## 2013-11-01 ENCOUNTER — Telehealth: Payer: Self-pay | Admitting: *Deleted

## 2013-11-01 DIAGNOSIS — R112 Nausea with vomiting, unspecified: Secondary | ICD-10-CM | POA: Diagnosis not present

## 2013-11-01 DIAGNOSIS — R109 Unspecified abdominal pain: Secondary | ICD-10-CM | POA: Diagnosis not present

## 2013-11-01 DIAGNOSIS — K219 Gastro-esophageal reflux disease without esophagitis: Secondary | ICD-10-CM | POA: Diagnosis not present

## 2013-11-01 MED ORDER — ALBUTEROL SULFATE (5 MG/ML) 0.5% IN NEBU
2.5000 mg | INHALATION_SOLUTION | RESPIRATORY_TRACT | Status: DC | PRN
  Administered 2013-11-01: 2.5 mg via RESPIRATORY_TRACT

## 2013-11-01 MED ORDER — OMEPRAZOLE 40 MG PO CPDR: 40.0000 mg | DELAYED_RELEASE_CAPSULE | Freq: Two times a day (BID) | ORAL | Status: DC

## 2013-11-01 MED ORDER — HYDROCODONE-ACETAMINOPHEN 5-325 MG PO TABS: 1.0000 | ORAL_TABLET | Freq: Four times a day (QID) | ORAL | Status: DC | PRN

## 2013-11-01 MED ORDER — ALBUTEROL SULFATE HFA 108 (90 BASE) MCG/ACT IN AERS: 2.0000 | INHALATION_SPRAY | RESPIRATORY_TRACT | Status: DC | PRN

## 2013-11-01 NOTE — Telephone Encounter (Signed)
[name] [name] verbalized understanding and said she would call back Mon to schedule f/u appt with Dr. Lorin Picket

## 2013-11-01 NOTE — Progress Notes (Signed)
Report called to [place] at [facility]. IV cath removed and intact. No pain/swelling at site. Facility package given to daughter for Nurse.

## 2013-11-01 NOTE — Evaluation (Signed)
Clinical/Bedside Swallow Evaluation Patient Details  Name: [name] [name] MRN: [id number] Date of Birth: 27-Jan-1924  Today's Date: 11/01/2013 Time: 0900-0925 SLP Time Calculation (min): 25 min  Past Medical History:  Past Medical History  Diagnosis Date  . ASCVD (arteriosclerotic cardiovascular disease) 2003    Single vessel disease-60% D1  . Hypertension   . Hyperlipidemia   . Diabetes mellitus     No insulin  . Leukopenia     and anemia-hematology evaluation was nodiagnostic  . Degenerative joint disease     right TKA in 4/06  . Upper GI bleed     gastric stress ulcer in 2003  . Retinal artery branch occlusion of right eye     Sudden onset of right eye blindness-embolism suspected  . Gastroesophageal reflux disease with hiatal hernia   . Osteoporosis   . Lung nodule     Left lower lobe  . Parkinson's disease   . Dementia   . Cervical spondylarthritis     S/p discectomy  . Alzheimer disease   . Diverticulosis   . Hiatal hernia   . Nephrolithiasis    Past Surgical History:  Past Surgical History  Procedure Laterality Date  . Total abdominal hysterectomy    . Cervical discectomy      Dr. Newell Coral  . Cholecystectomy    . Total knee arthroplasty  2006    Right; Dr. Eulah Pont  . Colonoscopy  2009  . Joint replacement    . Vascular surgery    . Cystoscopy w/ ureteral stent placement  09/23/2011    Procedure: CYSTOSCOPY WITH RETROGRADE PYELOGRAM/URETERAL STENT PLACEMENT;  Surgeon: Ky Barban;  Location: [facility];  Service: Urology;  Laterality: Right;   HPI:  [name] [name] is a 79 y.o. female who is a resident of [facility], was recently in the hospital and was discharged after being treated for abdominal pain and vomiting.  At that time it was felt that she likely had a viral gastroenteritis and since she had clinically improved the following day, she was discharged home. Her daughter reports that the following day on Sunday, she began developing vomiting.   This was only one episode per day.  She did not have any diarrhea and did not complain of abdominal pain.  When she went to her primary care doc today for hospital follow up, she vomited 3 times and complained of headache.  She was sent to the ED for evaluation. CT of the head did now show any acute findings.  CT of the abdomen and pelvis also was relatively unremarkable.  Urinalysis was also negative as were LFTs/lipase.  On exam it was noted that she had diffuse abdominal tenderness and she had a mild leukocytosis.    Assessment / Plan / Recommendation Clinical Impression  Pt seen for cognitive/swallowing evaluation today at bedside. Pt upright in bed per SLP entrance with daughter present. Pt presented with dementia at baseline, effecting her processing speed at times. However, pt was still able to understand simple conversation and give appropriate responses. SLP observed breakfast at mechanical soft consistency with thin liquids. Pt consumed all textures (sausage, bread, liquids, eggs) without evidence for pharyngeal dysfunction. Daughter reported that she often vomits after lying down for periods of time, and that she often lies down immediately after consumption of meals. SLP provided education to daughter regarding recommendation for 90 degree upright posture both during and 45 minutes post meal time as a strict guideline due to pt's hx of reflux. Daughter receptive  and in agreeance. Recommend that these guidelines are passed along to next level of care. No current oropharyngeal dysfunction has been recognized, therefore skilled STx is not indicated at this time.     Aspiration Risk  None given strict reflux precautions as stated above.    Diet Recommendation   Mechanical soft with thin liquids.        Other  Recommendations   N/A  Follow Up Recommendations    MBSS recommended should vomiting continue after decreasing acidity in diet and utilizing strict reflux precautions.    Frequency and  Duration   N/A     Pertinent Vitals/Pain Pt reported no pain today during evaluation.     SLP Swallow Goals  N/A   Swallow Study Prior Functional Status  Cognitive/Linguistic Baseline: Baseline deficits Baseline deficit details: Pt with dementia, but is usually oriented and can perform some basic ADLs.  Type of Home: Assisted living Available Help at Discharge: Family;Other (Comment) (Assisted Living.)    General HPI: [name] [name] is a 79 y.o. female who is a resident of [facility], was recently in the hospital and was discharged after being treated for abdominal pain and vomiting.  At that time it was felt that she likely had a viral gastroenteritis and since she had clinically improved the following day, she was discharged home. Her daughter reports that the following day on Sunday, she began developing vomiting.  This was only one episode per day.  She did not have any diarrhea and did not complain of abdominal pain.  When she went to her primary care doc today for hospital follow up, she vomited 3 times and complained of headache.  She was sent to the ED for evaluation. CT of the head did now show any acute findings.  CT of the abdomen and pelvis also was relatively unremarkable.  Urinalysis was also negative as were LFTs/lipase.  On exam it was noted that she had diffuse abdominal tenderness and she had a mild leukocytosis.     Oral/Motor/Sensory Function Overall Oral Motor/Sensory Function: Appears within functional limits for tasks assessed                     GO         [name] 11/01/2013,9:33 AM

## 2013-11-01 NOTE — Clinical Social Work Note (Signed)
Pt d/c today back to [facility]. Pt's daughter and facility aware and agreeable. Pt to transfer with daughter. D/C summary and FL2 faxed.  Derenda Fennel, [place] [phone]

## 2013-11-01 NOTE — Telephone Encounter (Signed)
Granddaughter would like for you to look at [name] [name]'s hospital notes. She is still  in hospital  but may come home today. Granddaughter concerned that if they let her come home today. She will start back vomiting and have to go back to hospital again.

## 2013-11-01 NOTE — Telephone Encounter (Signed)
I reviewed over the hospital notes. I don't find anything in the notes that would indicate a undetected problem as best as I can tell. It does seem reasonable for the patient to follow the directions of the doctors there certainly if she goes home today or tomorrow it would be wise for her to followup before Christmas with me

## 2013-11-01 NOTE — Clinical Social Work Note (Cosign Needed)
[facility] ([place], med tech) given update on anticipated discharge and patient return to facility this afternoon.  Asked to pass on information in report at shift change.  Santa Genera, LCSW Clinical Social Worker [phone])

## 2013-11-01 NOTE — Progress Notes (Signed)
PT Cancellation Note  Patient Details Name: [name] [name] MRN: [id number] DOB: 26-Jan-1924   Cancelled Treatment:    Reason Eval/Treat Not Completed: PT screened, no needs identified, will sign off Plans have been made for pt to return to [facility].  I had anticipated having pt try to transfer OOB to a chair with my assistance in order to assess her mobility.  Pt flatly refused to cooperate.  Daughter present and states that she has no concerns about the staff at [facility] being able to manage pt at d/c.  Myrlene Broker L 11/01/2013, 12:06 PM

## 2013-11-04 ENCOUNTER — Encounter (HOSPITAL_COMMUNITY): Payer: Self-pay | Admitting: Internal Medicine

## 2013-11-08 ENCOUNTER — Other Ambulatory Visit: Payer: Self-pay | Admitting: Family Medicine

## 2013-11-08 DIAGNOSIS — F0280 Dementia in other diseases classified elsewhere without behavioral disturbance: Secondary | ICD-10-CM

## 2013-11-08 DIAGNOSIS — K219 Gastro-esophageal reflux disease without esophagitis: Secondary | ICD-10-CM

## 2013-11-08 DIAGNOSIS — G309 Alzheimer's disease, unspecified: Secondary | ICD-10-CM

## 2013-11-08 DIAGNOSIS — E119 Type 2 diabetes mellitus without complications: Secondary | ICD-10-CM

## 2013-11-11 ENCOUNTER — Ambulatory Visit (INDEPENDENT_AMBULATORY_CARE_PROVIDER_SITE_OTHER): Payer: Medicare Other | Admitting: Family Medicine

## 2013-11-11 ENCOUNTER — Encounter: Payer: Self-pay | Admitting: Family Medicine

## 2013-11-11 VITALS — BP 124/64

## 2013-11-11 DIAGNOSIS — D649 Anemia, unspecified: Secondary | ICD-10-CM

## 2013-11-11 DIAGNOSIS — D539 Nutritional anemia, unspecified: Secondary | ICD-10-CM | POA: Diagnosis not present

## 2013-11-11 DIAGNOSIS — K219 Gastro-esophageal reflux disease without esophagitis: Secondary | ICD-10-CM | POA: Diagnosis not present

## 2013-11-11 DIAGNOSIS — N289 Disorder of kidney and ureter, unspecified: Secondary | ICD-10-CM

## 2013-11-11 LAB — BASIC METABOLIC PANEL
BUN: 22 mg/dL (ref 6–23)
CO2: 28 mEq/L (ref 19–32)
Chloride: 102 mEq/L (ref 96–112)
Creat: 1.15 mg/dL — ABNORMAL HIGH (ref 0.50–1.10)
Glucose, Bld: 122 mg/dL — ABNORMAL HIGH (ref 70–99)
Potassium: 4.6 mEq/L (ref 3.5–5.3)
Sodium: 139 mEq/L (ref 135–145)

## 2013-11-11 LAB — CBC
HCT: 37.6 % (ref 36.0–46.0)
Hemoglobin: 12.4 g/dL (ref 12.0–15.0)
RDW: 13.6 % (ref 11.5–15.5)
WBC: 5.5 10*3/uL (ref 4.0–10.5)

## 2013-11-11 NOTE — Progress Notes (Signed)
   Subjective:    Patient ID: [name] [name], female    DOB: 01/14/1924, 79 y.o.   MRN: [id number]  HPI Patient is here today for a follow up visit from the hospital. Patient was admitted to [facility] on 10/29/13 for abdominal pain. Patient is doing a lot better and has no concerns at this time.  This patient's had some intermittent vomiting spells. Also having some spells where she feels weak and run down for no apparent reason. Family feels like at times she has a hard time expressing what is going on because of her dementia  Past medical history reviewed. Hospital records were also reviewed. Greater than half the time was spent with family and answering questions. Review of Systems According to the family no recent vomiting no breathing difficulties chest pain no swelling in the legs    Objective:   Physical Exam Lungs clear hearts regular pulse normal blood pressure is good extremities no edema neurologically consistent with Alzheimer's pretty advanced.       Assessment & Plan:  #1 all of her medications were reviewed and approved. #2 reflux omeprazole 40 mg once daily. I don't feel insurance company will approve it for twice daily we will send a letter to the nursing home letting them know that it is important to minimize tomato-based products, citric products, chocolates and caffeine. If patient has ongoing trouble she'll followup. Greater than half time spent in discussion [id number].  CBC ordered because of history of anemia metabolic 7 ordered because of history of renal insufficiency and hypokalemia

## 2013-11-13 ENCOUNTER — Encounter: Payer: Self-pay | Admitting: Family Medicine

## 2013-11-13 ENCOUNTER — Other Ambulatory Visit: Payer: Self-pay | Admitting: Family Medicine

## 2013-11-13 NOTE — Progress Notes (Signed)
Granddaughter notified and verbalized understanding.

## 2013-11-15 ENCOUNTER — Telehealth: Payer: Self-pay | Admitting: Family Medicine

## 2013-11-15 NOTE — Telephone Encounter (Signed)
Currently, this sounds like a viral illness. Using the albuterol on a regular basis every 3-4 hours would be wise. Watch closely for fever. Also if progressive shortness of breath fever or difficulty breathing in the lungs then patient would need to be seen. Certainly there is some flu going around the community. Typically this involves temperatures of 101 or more with it. Antibiotics currently would not help. But if the illness lingers for multiple days or if progresses then needs to be seen to evaluate to make sure that pneumonia is not occurring as a result of the virus. See warning signs of what to watch for above.

## 2013-11-15 NOTE — Telephone Encounter (Signed)
Patient is having a lot of coughing and congestion and would like something called in to [facility]

## 2013-11-15 NOTE — Telephone Encounter (Signed)
Discussed with daughter 

## 2013-11-15 NOTE — Telephone Encounter (Signed)
Having cough, congestion, wheezing, using inhaler. No fever that she knows. started over night.

## 2013-11-19 ENCOUNTER — Other Ambulatory Visit: Payer: Self-pay | Admitting: Family Medicine

## 2013-11-22 ENCOUNTER — Emergency Department (HOSPITAL_COMMUNITY): Payer: Medicare Other

## 2013-11-22 ENCOUNTER — Encounter (HOSPITAL_COMMUNITY): Payer: Self-pay | Admitting: Emergency Medicine

## 2013-11-22 ENCOUNTER — Emergency Department (HOSPITAL_COMMUNITY)
Admission: EM | Admit: 2013-11-22 | Discharge: 2013-11-22 | Disposition: A | Discharge status: Home / Self Care | Payer: Medicare Other | Attending: Emergency Medicine | Admitting: Emergency Medicine

## 2013-11-22 ENCOUNTER — Telehealth: Payer: Self-pay | Admitting: Family Medicine

## 2013-11-22 DIAGNOSIS — G309 Alzheimer's disease, unspecified: Secondary | ICD-10-CM | POA: Diagnosis not present

## 2013-11-22 DIAGNOSIS — Z79899 Other long term (current) drug therapy: Secondary | ICD-10-CM | POA: Insufficient documentation

## 2013-11-22 DIAGNOSIS — G20 Parkinson's disease: Secondary | ICD-10-CM | POA: Insufficient documentation

## 2013-11-22 DIAGNOSIS — Z7982 Long term (current) use of aspirin: Secondary | ICD-10-CM | POA: Insufficient documentation

## 2013-11-22 DIAGNOSIS — Z8739 Personal history of other diseases of the musculoskeletal system and connective tissue: Secondary | ICD-10-CM | POA: Diagnosis not present

## 2013-11-22 DIAGNOSIS — I10 Essential (primary) hypertension: Secondary | ICD-10-CM | POA: Diagnosis not present

## 2013-11-22 DIAGNOSIS — F0280 Dementia in other diseases classified elsewhere without behavioral disturbance: Secondary | ICD-10-CM | POA: Diagnosis not present

## 2013-11-22 DIAGNOSIS — R0602 Shortness of breath: Secondary | ICD-10-CM | POA: Diagnosis not present

## 2013-11-22 DIAGNOSIS — K219 Gastro-esophageal reflux disease without esophagitis: Secondary | ICD-10-CM | POA: Insufficient documentation

## 2013-11-22 DIAGNOSIS — B9789 Other viral agents as the cause of diseases classified elsewhere: Secondary | ICD-10-CM | POA: Insufficient documentation

## 2013-11-22 DIAGNOSIS — Z8719 Personal history of other diseases of the digestive system: Secondary | ICD-10-CM | POA: Insufficient documentation

## 2013-11-22 DIAGNOSIS — R062 Wheezing: Secondary | ICD-10-CM | POA: Diagnosis not present

## 2013-11-22 DIAGNOSIS — R05 Cough: Secondary | ICD-10-CM | POA: Insufficient documentation

## 2013-11-22 DIAGNOSIS — R059 Cough, unspecified: Secondary | ICD-10-CM | POA: Insufficient documentation

## 2013-11-22 DIAGNOSIS — Z8673 Personal history of transient ischemic attack (TIA), and cerebral infarction without residual deficits: Secondary | ICD-10-CM | POA: Diagnosis not present

## 2013-11-22 DIAGNOSIS — Z87442 Personal history of urinary calculi: Secondary | ICD-10-CM | POA: Insufficient documentation

## 2013-11-22 DIAGNOSIS — E119 Type 2 diabetes mellitus without complications: Secondary | ICD-10-CM | POA: Insufficient documentation

## 2013-11-22 DIAGNOSIS — G20A1 Parkinson's disease without dyskinesia, without mention of fluctuations: Secondary | ICD-10-CM | POA: Insufficient documentation

## 2013-11-22 DIAGNOSIS — Z9089 Acquired absence of other organs: Secondary | ICD-10-CM | POA: Insufficient documentation

## 2013-11-22 DIAGNOSIS — Z862 Personal history of diseases of the blood and blood-forming organs and certain disorders involving the immune mechanism: Secondary | ICD-10-CM | POA: Insufficient documentation

## 2013-11-22 DIAGNOSIS — B349 Viral infection, unspecified: Secondary | ICD-10-CM

## 2013-11-22 LAB — CBC WITH DIFFERENTIAL/PLATELET
Basophils Absolute: 0 10*3/uL (ref 0.0–0.1)
Basophils Relative: 0 % (ref 0–1)
EOS ABS: 0.2 10*3/uL (ref 0.0–0.7)
Eosinophils Relative: 2 % (ref 0–5)
HCT: 36.8 % (ref 36.0–46.0)
Hemoglobin: 12 g/dL (ref 12.0–15.0)
Lymphocytes Relative: 40 % (ref 12–46)
Lymphs Abs: 2.7 10*3/uL (ref 0.7–4.0)
MCH: 33.1 pg (ref 26.0–34.0)
MCHC: 32.6 g/dL (ref 30.0–36.0)
MCV: 101.7 fL — ABNORMAL HIGH (ref 78.0–100.0)
Monocytes Absolute: 0.6 10*3/uL (ref 0.1–1.0)
Monocytes Relative: 9 % (ref 3–12)
NEUTROS ABS: 3.3 10*3/uL (ref 1.7–7.7)
NEUTROS PCT: 49 % (ref 43–77)
Platelets: 213 10*3/uL (ref 150–400)
RBC: 3.62 MIL/uL — AB (ref 3.87–5.11)
RDW: 13 % (ref 11.5–15.5)
WBC: 6.7 10*3/uL (ref 4.0–10.5)

## 2013-11-22 LAB — URINALYSIS, ROUTINE W REFLEX MICROSCOPIC
Bilirubin Urine: NEGATIVE
Glucose, UA: NEGATIVE mg/dL
LEUKOCYTES UA: NEGATIVE
Nitrite: NEGATIVE
PH: 6 (ref 5.0–8.0)
Specific Gravity, Urine: >1.03 — ABNORMAL HIGH (ref 1.005–1.030)
Urobilinogen, UA: 0.2 mg/dL (ref 0.0–1.0)

## 2013-11-22 LAB — BASIC METABOLIC PANEL
BUN: 17 mg/dL (ref 6–23)
CO2: 26 mEq/L (ref 19–32)
Calcium: 9 mg/dL (ref 8.4–10.5)
Chloride: 107 mEq/L (ref 96–112)
Creatinine, Ser: 1.08 mg/dL (ref 0.50–1.10)
GFR, EST AFRICAN AMERICAN: 52 mL/min — AB (ref >90)
GFR, EST NON AFRICAN AMERICAN: 45 mL/min — AB (ref >90)
Glucose, Bld: 132 mg/dL — ABNORMAL HIGH (ref 70–99)
Potassium: 3.9 mEq/L (ref 3.7–5.3)
Sodium: 142 mEq/L (ref 137–147)

## 2013-11-22 LAB — PRO B NATRIURETIC PEPTIDE: Pro B Natriuretic peptide (BNP): 427.6 pg/mL (ref 0–450)

## 2013-11-22 LAB — URINE MICROSCOPIC-ADD ON

## 2013-11-22 MED ORDER — ALBUTEROL SULFATE (2.5 MG/3ML) 0.083% IN NEBU: 2.5000 mg | INHALATION_SOLUTION | RESPIRATORY_TRACT | Status: DC | PRN

## 2013-11-22 MED ORDER — PREDNISONE 20 MG PO TABS: ORAL_TABLET | ORAL | Status: DC

## 2013-11-22 MED ORDER — PREDNISONE 50 MG PO TABS
60.0000 mg | ORAL_TABLET | Freq: Once | ORAL | Status: AC
  Administered 2013-11-22: 60 mg via ORAL
  Filled 2013-11-22 (×2): qty 1

## 2013-11-22 MED ORDER — METHYLPREDNISOLONE SODIUM SUCC 125 MG IJ SOLR
125.0000 mg | Freq: Once | INTRAMUSCULAR | Status: DC
  Filled 2013-11-22: qty 2

## 2013-11-22 MED ORDER — FUROSEMIDE 10 MG/ML IJ SOLN
40.0000 mg | Freq: Once | INTRAMUSCULAR | Status: AC
  Administered 2013-11-22: 40 mg via INTRAVENOUS
  Filled 2013-11-22: qty 4

## 2013-11-22 MED ORDER — IPRATROPIUM BROMIDE 0.02 % IN SOLN
0.5000 mg | Freq: Once | RESPIRATORY_TRACT | Status: AC
  Administered 2013-11-22: 0.5 mg via RESPIRATORY_TRACT
  Filled 2013-11-22: qty 2.5

## 2013-11-22 MED ORDER — ALBUTEROL SULFATE (2.5 MG/3ML) 0.083% IN NEBU
5.0000 mg | INHALATION_SOLUTION | Freq: Once | RESPIRATORY_TRACT | Status: AC
  Administered 2013-11-22: 5 mg via RESPIRATORY_TRACT
  Filled 2013-11-22: qty 6

## 2013-11-22 NOTE — ED Notes (Signed)
Pt comes from [place] via EMS with c/o SOB and productive cough x1-2 days. Pt has bilateral wheezing. Pt has hx of alzheimer's. Pt given breathing tx via EMS.

## 2013-11-22 NOTE — Telephone Encounter (Signed)
See how weekened goes, if gradually better then 23rd ok if rough weekend then Monday

## 2013-11-22 NOTE — Telephone Encounter (Signed)
Pt seen at ER this morning, chest x-ray normal, but they gave her Albuterol, ordered nebulizer, and prednisone, was told to follow up with Dr. Nicki Reaper.  [name] [name] wants to know can that follow up wait until her scheduled appointment for 12/13/13 or need to follow up sooner.

## 2013-11-22 NOTE — Telephone Encounter (Signed)
Discussed with daughter 

## 2013-11-22 NOTE — Discharge Instructions (Signed)
Chest x-ray showed no pneumonia.   Start prednisone prescription tomorrow.    Prescription for albuterol and home nebulizer machine given. Followup your primary care Dr.

## 2013-11-22 NOTE — ED Provider Notes (Signed)
CSN: [id number]     Arrival date & time 11/22/13  2563 History  This chart was scribed for [name] Christen, MD by Jenne Campus, ED Scribe. This patient was seen in room APA09/APA09 and the patient's care was started at 8:00 AM.   Chief Complaint  Patient presents with  . Shortness of Breath  . Cough   Level 5 Caveat-Dementia  The history is provided by the EMS personnel. No language interpreter was used.    HPI Comments: [name] [name] is a 80 y.o. female who presents to the Emergency Department by ambulance from [facility] for noted SOB with cough for the past 1 to 2 days. Upon EMS arrival, it was noted that she had audible wheezing and strong smelling urine. Oxygen Sats were in the high 80s which improved along with the wheezing with a breathing treatment into the high 90s en route. Due to pt's h/o dementia, she is unable to provide further details.  Past Medical History  Diagnosis Date  . ASCVD (arteriosclerotic cardiovascular disease) 2003    Single vessel disease-60% D1  . Hypertension   . Hyperlipidemia   . Diabetes mellitus     No insulin  . Leukopenia     and anemia-hematology evaluation was nodiagnostic  . Degenerative joint disease     right TKA in 4/06  . Upper GI bleed     gastric stress ulcer in 2003  . Retinal artery branch occlusion of right eye     Sudden onset of right eye blindness-embolism suspected  . Gastroesophageal reflux disease with hiatal hernia   . Osteoporosis   . Lung nodule     Left lower lobe  . Parkinson's disease   . Dementia   . Cervical spondylarthritis     S/p discectomy  . Alzheimer disease   . Diverticulosis   . Hiatal hernia   . Nephrolithiasis    Past Surgical History  Procedure Laterality Date  . Total abdominal hysterectomy    . Cervical discectomy      Dr. Sherwood Gambler  . Cholecystectomy    . Total knee arthroplasty  2006    Right; Dr. Percell Miller  . Colonoscopy  2009  . Joint replacement    . Vascular surgery    .  Cystoscopy w/ ureteral stent placement  09/23/2011    Procedure: CYSTOSCOPY WITH RETROGRADE PYELOGRAM/URETERAL STENT PLACEMENT;  Surgeon: Marissa Nestle;  Location: [facility];  Service: Urology;  Laterality: Right;  . Esophagogastroduodenoscopy N/A 10/31/2013    Procedure: ESOPHAGOGASTRODUODENOSCOPY (EGD);  Surgeon: Rogene Houston, MD;  Location: [facility];  Service: Endoscopy;  Laterality: N/A;   Family History  Problem Relation Age of Onset  . Diabetes Sister   . Heart disease Sister   . Diabetes Brother    History  Substance Use Topics  . Smoking status: Never Smoker   . Smokeless tobacco: Never Used  . Alcohol Use: No   No OB history provided.  Review of Systems  Unable to perform ROS: Dementia    Allergies  Codeine and Morphine  Home Medications   Current Outpatient Rx  Name  Route  Sig  Dispense  Refill  . acetaminophen (TYLENOL) 500 MG tablet   Oral   Take 500 mg by mouth 2 (two) times daily. *May take one tablet daily as needed for headache*         . albuterol (PROAIR HFA) 108 (90 BASE) MCG/ACT inhaler   Inhalation   Inhale 2 puffs into the lungs  every 4 (four) hours as needed for wheezing or shortness of breath.   3.7 g   0   . ASPIRIN LOW DOSE 81 MG EC tablet      TAKE 1 TABLET BY MOUTH ONCE DAILY.   30 tablet   3   . atropine 1 % ophthalmic solution   Right Eye   Place 1 drop into the right eye 2 (two) times daily.          . citalopram (CELEXA) 20 MG tablet   Oral   Take 20 mg by mouth daily.         . clotrimazole-betamethasone (LOTRISONE) cream   Topical   Apply 1 application topically daily as needed.          . Difluprednate (DUREZOL) 0.05 % EMUL   Right Eye   Place 1 drop into the right eye daily.         Marland Kitchen donepezil (ARICEPT) 5 MG tablet   Oral   Take 5 mg by mouth every morning.         . loratadine (CLARITIN) 10 MG tablet   Oral   Take 10 mg by mouth daily.         Marland Kitchen losartan (COZAAR) 25 MG tablet   Oral    Take 25 mg by mouth daily.         . memantine (NAMENDA) 10 MG tablet   Oral   Take 10 mg by mouth 2 (two) times daily.         . methylcellulose (ARTIFICIAL TEARS) 1 % ophthalmic solution   Both Eyes   Place 1 drop into both eyes 3 (three) times daily.          . mometasone (ELOCON) 0.1 % cream   Topical   Apply 1 application topically 2 (two) times daily as needed (for irritation).         Marland Kitchen omeprazole (PRILOSEC) 40 MG capsule   Oral   Take 1 capsule (40 mg total) by mouth 2 (two) times daily.   60 capsule   0   . ondansetron (ZOFRAN-ODT) 4 MG disintegrating tablet   Oral   Take 4 mg by mouth. Take one every 6 hours as needed for nausea         . polyethylene glycol (MIRALAX / GLYCOLAX) packet   Oral   Take 17 g by mouth daily. *Mixed with 8 ounces of water/juice daily*         . polyvinyl alcohol (ARTIFICIAL TEARS) 1.4 % ophthalmic solution   Both Eyes   Place 1 drop into both eyes every 8 (eight) hours as needed for dry eyes.         . primidone (MYSOLINE) 50 MG tablet   Oral   Take 0.5 tablets (25 mg total) by mouth at bedtime.   15 tablet   5   . sucralfate (CARAFATE) 1 G tablet   Oral   Take 1 g by mouth 4 (four) times daily. *to be crushed in small amount of liquid*         . Vitamin B Complex-C CAPS   Oral   Take 1 capsule by mouth daily.   30 each   12   . vitamin E 400 UNIT capsule      TAKE 1 CAPSULE BY MOUTH ONCE DAILY.   30 capsule   5   . albuterol (PROVENTIL) (2.5 MG/3ML) 0.083% nebulizer solution   Nebulization   Take 3 mLs (  2.5 mg total) by nebulization every 4 (four) hours as needed for wheezing or shortness of breath.   30 vial   1   . predniSONE (DELTASONE) 20 MG tablet      3 tabs po day one, then 2 po daily x 4 days   11 tablet   0    Triage Vitals: BP 145/55  Pulse 53  Temp(Src) 98.8 F (37.1 C) (Oral)  Resp 18  SpO2 93%  Physical Exam  Nursing note and vitals reviewed. Constitutional: She appears  well-developed and well-nourished.  HENT:  Head: Normocephalic and atraumatic.  Eyes: Conjunctivae and EOM are normal.  Neck: Normal range of motion. Neck supple.  Cardiovascular: Normal rate, regular rhythm and normal heart sounds.   Pulmonary/Chest: Effort normal. She has wheezes.  Musculoskeletal: Normal range of motion.  Neurological: She is alert.  demented  Skin: Skin is warm and dry.  Psychiatric: She has a normal mood and affect. Her behavior is normal.    ED Course  Procedures (including critical care time)  Medications  methylPREDNISolone sodium succinate (SOLU-MEDROL) 125 mg/2 mL injection 125 mg (125 mg Intravenous Not Given 11/22/13 1137)  predniSONE (DELTASONE) tablet 60 mg (not administered)  albuterol (PROVENTIL) (2.5 MG/3ML) 0.083% nebulizer solution 5 mg (5 mg Nebulization Given 11/22/13 0911)  ipratropium (ATROVENT) nebulizer solution 0.5 mg (0.5 mg Nebulization Given 11/22/13 0911)  furosemide (LASIX) injection 40 mg (40 mg Intravenous Given 11/22/13 1002)    DIAGNOSTIC STUDIES: Oxygen Saturation is 93% on RA, adequate by my interpretation.    COORDINATION OF CARE: 8:10 AM- Will order CXR and breathing treatment.   Labs Review Labs Reviewed  CBC WITH DIFFERENTIAL - Abnormal; Notable for the following:    RBC 3.62 (*)    MCV 101.7 (*)    All other components within normal limits  BASIC METABOLIC PANEL - Abnormal; Notable for the following:    Glucose, Bld 132 (*)    GFR calc non Af Amer 45 (*)    GFR calc Af Amer 52 (*)    All other components within normal limits  URINALYSIS, ROUTINE W REFLEX MICROSCOPIC - Abnormal; Notable for the following:    Specific Gravity, Urine >1.030 (*)    Hgb urine dipstick LARGE (*)    Ketones, ur TRACE (*)    Protein, ur TRACE (*)    All other components within normal limits  URINE MICROSCOPIC-ADD ON  PRO B NATRIURETIC PEPTIDE   Imaging Review Dg Chest Port 1 View  11/22/2013   CLINICAL DATA:  Wheezing.  EXAM: PORTABLE  CHEST - 1 VIEW  COMPARISON:  10/31/2013  FINDINGS: Low lung volumes. Perihilar airspace disease and bibasilar opacities. This could reflect atelectasis or early edema. No effusions. No acute bony abnormality. Heart is borderline in size.  IMPRESSION: Low lung volumes with perihilar and lower lobe opacities, question edema versus atelectasis.   Electronically Signed   By: Rolm Baptise M.D.   On: 11/22/2013 08:42    EKG Interpretation   None       MDM   1. Viral syndrome    Chest x-ray shows no pneumonia. Patient feels much better after albuterol Atrovent nebulizer. She is hemodynamically stable. Discharge medications prednisone and albuterol nebulizer  I personally performed the services described in this documentation, which was scribed in my presence. The recorded information has been reviewed and is accurate.    [name] Christen, MD 11/22/13 1145

## 2013-11-27 ENCOUNTER — Other Ambulatory Visit: Payer: Self-pay | Admitting: Family Medicine

## 2013-12-02 ENCOUNTER — Other Ambulatory Visit: Payer: Self-pay | Admitting: Family Medicine

## 2013-12-09 ENCOUNTER — Other Ambulatory Visit: Payer: Self-pay | Admitting: Family Medicine

## 2013-12-13 ENCOUNTER — Encounter: Payer: Self-pay | Admitting: Family Medicine

## 2013-12-13 ENCOUNTER — Ambulatory Visit (INDEPENDENT_AMBULATORY_CARE_PROVIDER_SITE_OTHER): Payer: Medicare Other | Admitting: Family Medicine

## 2013-12-13 VITALS — BP 122/64 | Ht 60.0 in

## 2013-12-13 DIAGNOSIS — I10 Essential (primary) hypertension: Secondary | ICD-10-CM

## 2013-12-13 NOTE — Progress Notes (Signed)
   Subjective:    Patient ID: [name] [name], female    DOB: 1924/06/20, 80 y.o.   MRN: [id number]  HPI    Review of Systems     Objective:   Physical Exam        Assessment & Plan:

## 2013-12-13 NOTE — Progress Notes (Signed)
   Subjective:    Patient ID: AMONDA BRILLHART, female    DOB: 08/01/1924, 80 y.o.   MRN: [id number]  Hypertension This is a chronic problem. The current episode started more than 1 year ago. The problem has been gradually improving since onset. The problem is controlled. There are no associated agents to hypertension. There are no known risk factors for coronary artery disease. Treatments tried: losartan. The current treatment provides significant improvement. There are no compliance problems.   Last A1C was done on 10/25/13 and it was 6.4. No concerns noted at this time.  At times she makes a wheezing sound family is concerned about this. At times she grabs her neck and says it hurts in the front part   Review of Systems Patient has Alzheimer's unable to give accurate review of systems    Objective:   Physical Exam  Lungs are clear hearts regular extremities no edema skin warm dry pulse normal blood pressure good There is no sign of any wheezing this is upper airway noise we will follow this patient does not cough I doubt foreign body There is no masses in the neck.     Assessment & Plan:  HTN-good control continue current measures Alzheimer's stable continue current measures Comprehensive followup in the spring along with lab work

## 2013-12-16 ENCOUNTER — Other Ambulatory Visit: Payer: Self-pay | Admitting: Family Medicine

## 2013-12-18 ENCOUNTER — Other Ambulatory Visit: Payer: Self-pay | Admitting: Family Medicine

## 2013-12-20 ENCOUNTER — Ambulatory Visit (INDEPENDENT_AMBULATORY_CARE_PROVIDER_SITE_OTHER): Payer: Medicare Other | Admitting: Neurology

## 2013-12-20 ENCOUNTER — Encounter: Payer: Self-pay | Admitting: Neurology

## 2013-12-20 ENCOUNTER — Encounter (INDEPENDENT_AMBULATORY_CARE_PROVIDER_SITE_OTHER): Payer: Self-pay

## 2013-12-20 VITALS — BP 130/65 | HR 67 | Temp 99.1°F

## 2013-12-20 DIAGNOSIS — F0390 Unspecified dementia without behavioral disturbance: Secondary | ICD-10-CM

## 2013-12-20 NOTE — Progress Notes (Signed)
Subjective:    Patient ID: [name] [name] is a 80 y.o. female.  HPI    Interim history:   [name] [name] is a very pleasant 80 year old right-handed woman who presents for followup consultation of her advanced dementia, gait disturbance, tremors consistent with essential tremor. She is accompanied by her granddaughter and her daughter, [name] [name], (both have POA) today. I last saw her on 07/04/2013 at which time I suggested consideration of placing her back on Aricept. She was just on Namenda at the time. In the interim, she has been restarted on Aricept low-dose by her primary care physician. It had previously been stopped because of cost and chronic cough as I understand. She developed right wrist cellulitis in October. In December she developed more agitation. On 10/25/2013 she was admitted to the hospital for a day because of nausea, vomiting and diarrhea. She was discharged back to her assisted-living facility. She was readmitted to the hospital on 10/29/2013 because of recurrence of vomiting. She had an EGD on 10/31/2013, which showed a large hiatal hernia but no ulcer or bleeding. It was felt that she had multifactorial abdominal pain and vomiting. Speech therapy was consulted. She was placed on a dysphagia diet. She also had a head CT on 10/29/2013 which I reviewed: Atrophy with small vessel chronic ischemic changes of deep cerebral white matter. Question tiny old lacunar infarct right thalamus. No acute intracranial abnormalities. In addition, personally reviewed the images through the PACS system. I reviewed her hospital records from her admission from 12/5 through 10/26/2013 as well as from her admission from 12/09 to 10/31/13. She was also seen in the emergency room recently on 11/22/2013 for a cough. She was diagnosed with a viral syndrome. She had a chest x-ray which was negative for pneumonia. She was symptomatically treated with albuterol and prednisone.  Today, her GD reports, that she is  eating well. She has an appointment with GI next week. Both her GD and daughter report, that she is tolerating the Aricept 5 mg well, no connection between her diarrhea in December and she is doing well with it. They report, she is less agitated. She has a nebulizer as needed and she has had intermittent wheezing still.   I saw her on 01/11/2014, at which time I felt she had advanced dementia, gait disturbance, and tremors consistent with ET and no evidence of parkinsonism.  I first saw her on 12/21/2012 and suggested a trial of Mysoline. She had a fall and was seen in the emergency room on 06/16/2013. She had no overt injury. She lives at the [facility] in the assisted living facility. She had a head CT which did not show any intracranial hemorrhage, but advanced atrophy. She had a cervical spine CT which did not show any fracture. I originally started her on a very small dose of Mysoline in January, a quarter of a pill and then increase it in February 2 half a pill each night. She has been established on Mysoline for the past 6 months. I discussed with her granddaughter that Mysoline does have a tendency to sedate and cause balance problems but in light of her being stable on the same dose I did not think it was connected to her fall. Mysoline helped her hand tremors and helped her feed herself. She has had dementia for years. Per granddaughter she was originally treated with Aricept and then Namenda was added and for some reason several years ago Aricept was discontinued.   Her Past Medical History  Is Significant For: Past Medical History  Diagnosis Date  . ASCVD (arteriosclerotic cardiovascular disease) 2003    Single vessel disease-60% D1  . Hypertension   . Hyperlipidemia   . Diabetes mellitus     No insulin  . Leukopenia     and anemia-hematology evaluation was nodiagnostic  . Degenerative joint disease     right TKA in 4/06  . Upper GI bleed     gastric stress ulcer in 2003  . Retinal  artery branch occlusion of right eye     Sudden onset of right eye blindness-embolism suspected  . Gastroesophageal reflux disease with hiatal hernia   . Osteoporosis   . Lung nodule     Left lower lobe  . Parkinson's disease   . Dementia   . Cervical spondylarthritis     S/p discectomy  . Alzheimer disease   . Diverticulosis   . Hiatal hernia   . Nephrolithiasis     Her Past Surgical History Is Significant For: Past Surgical History  Procedure Laterality Date  . Total abdominal hysterectomy    . Cervical discectomy      Dr. Sherwood Gambler  . Cholecystectomy    . Total knee arthroplasty  2006    Right; Dr. Percell Miller  . Colonoscopy  2009  . Joint replacement    . Vascular surgery    . Cystoscopy w/ ureteral stent placement  09/23/2011    Procedure: CYSTOSCOPY WITH RETROGRADE PYELOGRAM/URETERAL STENT PLACEMENT;  Surgeon: Marissa Nestle;  Location: [facility];  Service: Urology;  Laterality: Right;  . Esophagogastroduodenoscopy N/A 10/31/2013    Procedure: ESOPHAGOGASTRODUODENOSCOPY (EGD);  Surgeon: Rogene Houston, MD;  Location: [facility];  Service: Endoscopy;  Laterality: N/A;    Her Family History Is Significant For: Family History  Problem Relation Age of Onset  . Diabetes Sister   . Heart disease Sister   . Diabetes Brother     Her Social History Is Significant For: History   Social History  . Marital Status: Widowed    Spouse Name: N/A    Number of Children: N/A  . Years of Education: N/A   Social History Main Topics  . Smoking status: Never Smoker   . Smokeless tobacco: Never Used  . Alcohol Use: No  . Drug Use: No  . Sexual Activity: Yes    Birth Control/ Protection: Surgical   Other Topics Concern  . None   Social History Narrative   Retired from [employer]     Her Allergies Are:  Allergies  Allergen Reactions  . Codeine Other (See Comments)    REACTION: Unknown  . Morphine Nausea And Vomiting  :   Her Current Medications Are:   Outpatient Encounter Prescriptions as of 12/20/2013  Medication Sig  . albuterol (PROAIR HFA) 108 (90 BASE) MCG/ACT inhaler Inhale 2 puffs into the lungs every 4 (four) hours as needed for wheezing or shortness of breath.  Marland Kitchen albuterol (PROVENTIL) (2.5 MG/3ML) 0.083% nebulizer solution Take 3 mLs (2.5 mg total) by nebulization every 4 (four) hours as needed for wheezing or shortness of breath.  . ASPIRIN LOW DOSE 81 MG EC tablet TAKE 1 TABLET BY MOUTH ONCE DAILY.  Marland Kitchen atropine 1 % ophthalmic solution Place 1 drop into the right eye 2 (two) times daily.   . citalopram (CELEXA) 20 MG tablet Take 20 mg by mouth daily.  . clotrimazole-betamethasone (LOTRISONE) cream Apply 1 application topically daily as needed.   . Difluprednate (DUREZOL) 0.05 % EMUL Place 1  drop into the right eye daily.  Marland Kitchen donepezil (ARICEPT) 5 MG tablet Take 5 mg by mouth every morning.  . loratadine (CLARITIN) 10 MG tablet Take 10 mg by mouth daily.  Marland Kitchen losartan (COZAAR) 25 MG tablet TAKE 1 TABLET BY MOUTH ONCE DAILY.  . memantine (NAMENDA) 10 MG tablet Take 10 mg by mouth 2 (two) times daily.  . methylcellulose (ARTIFICIAL TEARS) 1 % ophthalmic solution Place 1 drop into both eyes 3 (three) times daily.   . mometasone (ELOCON) 0.1 % cream Apply 1 application topically 2 (two) times daily as needed (for irritation).  Marland Kitchen omeprazole (PRILOSEC) 40 MG capsule TAKE 1 CAPSULE ONCE DAILY FOR ACID REFLUX.  Marland Kitchen ondansetron (ZOFRAN-ODT) 4 MG disintegrating tablet DISSOLVE 1 TABLET IN MOOUTH EVERY 6 HOURS AS NEEDED FOR NAUSEA.  . polyethylene glycol (MIRALAX / GLYCOLAX) packet Take 17 g by mouth daily. *Mixed with 8 ounces of water/juice daily*  . polyvinyl alcohol (ARTIFICIAL TEARS) 1.4 % ophthalmic solution Place 1 drop into both eyes every 8 (eight) hours as needed for dry eyes.  . potassium chloride (K-DUR) 10 MEQ tablet   . predniSONE (DELTASONE) 20 MG tablet 3 tabs po day one, then 2 po daily x 4 days  . primidone (MYSOLINE) 50 MG tablet  Take 0.5 tablets (25 mg total) by mouth at bedtime.  . Q-PAP 500 MG tablet TAKE 1 TABLET BY MOUTH TWICE DAILY.  Marland Kitchen sucralfate (CARAFATE) 1 G tablet Take 1 g by mouth 4 (four) times daily. *to be crushed in small amount of liquid*  . Vitamin B Complex-C CAPS Take 1 capsule by mouth daily.  . vitamin E 400 UNIT capsule TAKE 1 CAPSULE BY MOUTH ONCE DAILY.  . [DISCONTINUED] HYDROcodone-acetaminophen (NORCO/VICODIN) 5-325 MG per tablet   . [DISCONTINUED] tobramycin-dexamethasone (TOBRADEX) ophthalmic solution   :  Review of Systems:  Out of a complete 14 point review of systems, all are reviewed and negative with the exception of these symptoms as listed below:   Review of Systems  Constitutional: Negative.   HENT: Negative.   Eyes: Negative.   Respiratory: Positive for wheezing.   Cardiovascular: Negative.   Gastrointestinal: Negative.   Endocrine: Negative.   Genitourinary: Negative.   Musculoskeletal: Negative.   Skin: Negative.   Allergic/Immunologic: Negative.   Neurological: Negative.   Hematological: Negative.   Psychiatric/Behavioral: Negative.     Objective:  Neurologic Exam  Physical Exam Physical Examination:   Filed Vitals:   12/20/13 1226  BP: 130/65  Pulse: 67  Temp: 99.1 F (37.3 C)    On general exam: The patient is a frail-appearing elderly lady situated in a wheelchair in no apparent distress. She is very friendly and mostly cooperative but is unable to perform multistep tasks and has even trouble with mimicking. She is unable to provide any history. She answers questions but they are out of context. She is mostly only able to follow one-step commands.   HEENT exam. She has right enophthalmos and right ptosis, unchanged as well as poor tracking. Face is symmetric with fairly normal facial animation. She has no lip or neck tremor today. Neck is fairly supple once she is able to relax. Full range of motion is noted. Hearing is impaired. Speech is very mildly  dysarthric and at times difficult to understand. Chest auscultation demonstrates mild and expiratory wheezing bilaterally heart sounds are normal and abdominal sounds are normal, abdomen is soft and nontender. She has no pitting edema in the distal lower extremities. Mucosal membranes are  mildly dry and skin is warm and dry. Neurologically: Mental status: The patient is awake and paying little attention. She is not oriented except for self. Cranial nerves are as described under HEENT exam. Motor exam: Fairly normal bulk is noted for age. Tone is fairly N. She does not exhibit much in the way of resting tremor in the upper extremities today. she has an intermittent postural tremor and some action tremor. Fine motor skills are mildly impaired in both upper and lower extremities. I did not have her stand or walk for me today, as they did not bring her 2-wheeled walker.  Sensory exam is intact to light touch throughout. She is not able to participate for finger to nose testing or more extensive sensory testing.   Assessment and Plan:   In summary, Ms. Walski is a very pleasant 80 year old right-handed female with a history of advanced dementia without behavioral disturbance, gait disorder, and tremors. I suggested that she continue with her current medications. She is now back on Aricept low-dose. We can think about advancing diet slowly. I did not suggest any medication changes at this time. I would like to see her back in routine followup in 4 months from now, sooner if the need arises. Alyse Low and [name] [name] were in agreement.

## 2013-12-20 NOTE — Patient Instructions (Signed)
Continue namenda and Aricept 5 mg. Namenda 10 mg twice daily may soon have to be changed to Namenda XR 21 mg or even 28 mg once, but I think we will go with the 21 mg strength and eventually also increase the Aricept to the 10 mg once, but not right now.

## 2013-12-24 ENCOUNTER — Encounter (INDEPENDENT_AMBULATORY_CARE_PROVIDER_SITE_OTHER): Payer: Self-pay | Admitting: Internal Medicine

## 2013-12-24 ENCOUNTER — Ambulatory Visit (INDEPENDENT_AMBULATORY_CARE_PROVIDER_SITE_OTHER): Payer: Medicare Other | Admitting: Internal Medicine

## 2013-12-24 VITALS — BP 130/66 | HR 68 | Temp 98.4°F | Resp 18 | Ht 60.0 in | Wt 173.0 lb

## 2013-12-24 DIAGNOSIS — K219 Gastro-esophageal reflux disease without esophagitis: Secondary | ICD-10-CM

## 2013-12-24 MED ORDER — SUCRALFATE 1 G PO TABS: 2.0000 g | ORAL_TABLET | Freq: Every day | ORAL | Status: DC

## 2013-12-24 NOTE — Progress Notes (Signed)
Presenting complaint;  Followup for GERD.  Subjective:  Patient is 80 year old Caucasian female with history of dementia who was hospitalized in December for intractable vomiting. She underwent EGD and was found to have large hiatal hernia. Her vomiting spells were possibly related to gastroenteritis. She responded to therapy and was discharged and no returns for followup visit accompanied by her daughter Ms. Harrison Mons. She has not had single episode of vomiting since she left the hospital about 8 weeks ago. She has difficulty swallowing large pills.. her daughter has noted wheezing when she is in unfamiliar surroundings like today. She is also wondering if she needs to be back on fluid medication and potassium.  Current Medications: Current Outpatient Prescriptions  Medication Sig Dispense Refill  . albuterol (PROAIR HFA) 108 (90 BASE) MCG/ACT inhaler Inhale 2 puffs into the lungs every 4 (four) hours as needed for wheezing or shortness of breath.  3.7 g  0  . albuterol (PROVENTIL) (2.5 MG/3ML) 0.083% nebulizer solution Take 3 mLs (2.5 mg total) by nebulization every 4 (four) hours as needed for wheezing or shortness of breath.  30 vial  1  . ASPIRIN LOW DOSE 81 MG EC tablet TAKE 1 TABLET BY MOUTH ONCE DAILY.  30 tablet  3  . atropine 1 % ophthalmic solution Place 1 drop into the right eye daily.       . citalopram (CELEXA) 20 MG tablet Take 20 mg by mouth daily.      . clotrimazole-betamethasone (LOTRISONE) cream Apply 1 application topically daily as needed.       . Difluprednate (DUREZOL) 0.05 % EMUL Place 1 drop into the right eye daily.      Marland Kitchen donepezil (ARICEPT) 5 MG tablet Take 5 mg by mouth every morning.      . loratadine (CLARITIN) 10 MG tablet Take 10 mg by mouth daily.      Marland Kitchen losartan (COZAAR) 25 MG tablet TAKE 1 TABLET BY MOUTH ONCE DAILY.  30 tablet  5  . memantine (NAMENDA) 10 MG tablet Take 10 mg by mouth 2 (two) times daily.      . methylcellulose (ARTIFICIAL TEARS) 1 %  ophthalmic solution Place 1 drop into both eyes 3 (three) times daily.       . mometasone (ELOCON) 0.1 % cream Apply 1 application topically 2 (two) times daily as needed (for irritation).      Marland Kitchen omeprazole (PRILOSEC) 40 MG capsule TAKE 1 CAPSULE ONCE DAILY FOR ACID REFLUX.  30 capsule  5  . ondansetron (ZOFRAN-ODT) 4 MG disintegrating tablet DISSOLVE 1 TABLET IN MOOUTH EVERY 6 HOURS AS NEEDED FOR NAUSEA.  6 tablet  6  . polyethylene glycol (MIRALAX / GLYCOLAX) packet Take 17 g by mouth daily. *Mixed with 8 ounces of water/juice daily*      . polyvinyl alcohol (ARTIFICIAL TEARS) 1.4 % ophthalmic solution Place 1 drop into both eyes every 8 (eight) hours as needed for dry eyes.      . primidone (MYSOLINE) 50 MG tablet Take 0.5 tablets (25 mg total) by mouth at bedtime.  15 tablet  5  . sucralfate (CARAFATE) 1 G tablet Take 1 g by mouth 4 (four) times daily. *to be crushed in small amount of liquid*      . Vitamin B Complex-C CAPS Take 1 capsule by mouth daily.  30 each  12  . vitamin E 400 UNIT capsule TAKE 1 CAPSULE BY MOUTH ONCE DAILY.  30 capsule  5   No current facility-administered  medications for this visit.     Objective: Blood pressure 130/66, pulse 68, temperature 98.4 F (36.9 C), temperature source Oral, resp. rate 18, height 5' (1.524 m), weight 173 lb (78.472 kg). Patient is wheelchair-bound. She is alert and responds appropriately to simple questions. She is wheezing intermittently. Conjunctiva is pink. Sclera is nonicteric Oropharyngeal mucosa is normal. No neck masses or thyromegaly noted. Cardiac exam with regular rhythm normal S1 and S2. No murmur or gallop noted. Lungs are clear to auscultation. Wheezing sounds trace to her neck. Abdomen is full but soft and nontender without organomegaly or masses.  She has trace pitting edema around ankles.    Assessment:  #1. GERD. Patient has large hiatal hernia. She appears to be doing well with combination of PPI and  sucralfate. I believe sucralfate dose could be reduced and eventually discontinued.    Plan:  Decrease sucralfate to 2 g by mouth each bedtime. Unless symptoms relapse this medication could be stopped in 2 months. Office visit in 6 months.

## 2013-12-24 NOTE — Patient Instructions (Signed)
Call with progress report in 2 months; Unless vomiting spells return will stop Carafate or sucralfate in 2 months.

## 2013-12-25 ENCOUNTER — Emergency Department (HOSPITAL_COMMUNITY): Payer: Medicare Other

## 2013-12-25 ENCOUNTER — Encounter (HOSPITAL_COMMUNITY): Payer: Self-pay | Admitting: Emergency Medicine

## 2013-12-25 ENCOUNTER — Emergency Department (HOSPITAL_COMMUNITY)
Admission: EM | Admit: 2013-12-25 | Discharge: 2013-12-25 | Disposition: A | Discharge status: Skilled Nursing Facility | Payer: Medicare Other | Attending: Emergency Medicine | Admitting: Emergency Medicine

## 2013-12-25 DIAGNOSIS — Z7982 Long term (current) use of aspirin: Secondary | ICD-10-CM | POA: Insufficient documentation

## 2013-12-25 DIAGNOSIS — R062 Wheezing: Secondary | ICD-10-CM | POA: Diagnosis not present

## 2013-12-25 DIAGNOSIS — E119 Type 2 diabetes mellitus without complications: Secondary | ICD-10-CM | POA: Insufficient documentation

## 2013-12-25 DIAGNOSIS — I10 Essential (primary) hypertension: Secondary | ICD-10-CM | POA: Insufficient documentation

## 2013-12-25 DIAGNOSIS — J385 Laryngeal spasm: Secondary | ICD-10-CM | POA: Insufficient documentation

## 2013-12-25 DIAGNOSIS — K219 Gastro-esophageal reflux disease without esophagitis: Secondary | ICD-10-CM | POA: Insufficient documentation

## 2013-12-25 DIAGNOSIS — G309 Alzheimer's disease, unspecified: Secondary | ICD-10-CM | POA: Diagnosis not present

## 2013-12-25 DIAGNOSIS — G3183 Dementia with Lewy bodies: Secondary | ICD-10-CM

## 2013-12-25 DIAGNOSIS — Z79899 Other long term (current) drug therapy: Secondary | ICD-10-CM | POA: Diagnosis not present

## 2013-12-25 DIAGNOSIS — Z87442 Personal history of urinary calculi: Secondary | ICD-10-CM | POA: Diagnosis not present

## 2013-12-25 DIAGNOSIS — M1990 Unspecified osteoarthritis, unspecified site: Secondary | ICD-10-CM | POA: Diagnosis not present

## 2013-12-25 DIAGNOSIS — F0280 Dementia in other diseases classified elsewhere without behavioral disturbance: Secondary | ICD-10-CM | POA: Diagnosis not present

## 2013-12-25 DIAGNOSIS — R0602 Shortness of breath: Secondary | ICD-10-CM | POA: Diagnosis not present

## 2013-12-25 DIAGNOSIS — Z862 Personal history of diseases of the blood and blood-forming organs and certain disorders involving the immune mechanism: Secondary | ICD-10-CM | POA: Diagnosis not present

## 2013-12-25 LAB — PRO B NATRIURETIC PEPTIDE: Pro B Natriuretic peptide (BNP): 570.7 pg/mL — ABNORMAL HIGH (ref 0–450)

## 2013-12-25 LAB — CBC WITH DIFFERENTIAL/PLATELET
Basophils Absolute: 0 10*3/uL (ref 0.0–0.1)
Basophils Relative: 1 % (ref 0–1)
Eosinophils Absolute: 0.2 10*3/uL (ref 0.0–0.7)
Eosinophils Relative: 3 % (ref 0–5)
HCT: 38.3 % (ref 36.0–46.0)
HEMOGLOBIN: 12.6 g/dL (ref 12.0–15.0)
LYMPHS ABS: 1.8 10*3/uL (ref 0.7–4.0)
Lymphocytes Relative: 27 % (ref 12–46)
MCH: 33.5 pg (ref 26.0–34.0)
MCHC: 32.9 g/dL (ref 30.0–36.0)
MCV: 101.9 fL — ABNORMAL HIGH (ref 78.0–100.0)
Monocytes Absolute: 0.4 10*3/uL (ref 0.1–1.0)
Monocytes Relative: 6 % (ref 3–12)
NEUTROS PCT: 64 % (ref 43–77)
Neutro Abs: 4.4 10*3/uL (ref 1.7–7.7)
PLATELETS: ADEQUATE 10*3/uL (ref 150–400)
RBC: 3.76 MIL/uL — ABNORMAL LOW (ref 3.87–5.11)
RDW: 13.3 % (ref 11.5–15.5)
WBC: 6.9 10*3/uL (ref 4.0–10.5)

## 2013-12-25 LAB — COMPREHENSIVE METABOLIC PANEL
ALK PHOS: 53 U/L (ref 39–117)
ALT: 19 U/L (ref 0–35)
AST: 18 U/L (ref 0–37)
Albumin: 3.6 g/dL (ref 3.5–5.2)
BILIRUBIN TOTAL: 0.2 mg/dL — AB (ref 0.3–1.2)
BUN: 20 mg/dL (ref 6–23)
CO2: 25 meq/L (ref 19–32)
Calcium: 9.3 mg/dL (ref 8.4–10.5)
Chloride: 103 mEq/L (ref 96–112)
Creatinine, Ser: 1.06 mg/dL (ref 0.50–1.10)
GFR, EST AFRICAN AMERICAN: 53 mL/min — AB (ref >90)
GFR, EST NON AFRICAN AMERICAN: 46 mL/min — AB (ref >90)
GLUCOSE: 151 mg/dL — AB (ref 70–99)
POTASSIUM: 4.5 meq/L (ref 3.7–5.3)
Sodium: 140 mEq/L (ref 137–147)
Total Protein: 7.3 g/dL (ref 6.0–8.3)

## 2013-12-25 LAB — TROPONIN I: Troponin I: <0.3 ng/mL (ref <0.30)

## 2013-12-25 MED ORDER — ALBUTEROL SULFATE (2.5 MG/3ML) 0.083% IN NEBU
5.0000 mg | INHALATION_SOLUTION | Freq: Once | RESPIRATORY_TRACT | Status: AC
  Administered 2013-12-25: 5 mg via RESPIRATORY_TRACT
  Filled 2013-12-25: qty 6

## 2013-12-25 MED ORDER — PREDNISONE 20 MG PO TABS: ORAL_TABLET | ORAL | Status: DC

## 2013-12-25 MED ORDER — ALBUTEROL SULFATE (2.5 MG/3ML) 0.083% IN NEBU
2.5000 mg | INHALATION_SOLUTION | Freq: Once | RESPIRATORY_TRACT | Status: AC
  Administered 2013-12-25: 2.5 mg via RESPIRATORY_TRACT
  Filled 2013-12-25: qty 3

## 2013-12-25 MED ORDER — IPRATROPIUM-ALBUTEROL 0.5-2.5 (3) MG/3ML IN SOLN
3.0000 mL | Freq: Once | RESPIRATORY_TRACT | Status: AC
  Administered 2013-12-25: 3 mL via RESPIRATORY_TRACT
  Filled 2013-12-25: qty 3

## 2013-12-25 MED ORDER — OMEPRAZOLE 40 MG PO CPDR: DELAYED_RELEASE_CAPSULE | ORAL | Status: DC

## 2013-12-25 MED ORDER — ALBUTEROL SULFATE (2.5 MG/3ML) 0.083% IN NEBU: 5.0000 mg | INHALATION_SOLUTION | Freq: Once | RESPIRATORY_TRACT | Status: DC

## 2013-12-25 MED ORDER — IPRATROPIUM BROMIDE 0.02 % IN SOLN
0.5000 mg | Freq: Once | RESPIRATORY_TRACT | Status: AC
  Administered 2013-12-25: 0.5 mg via RESPIRATORY_TRACT
  Filled 2013-12-25: qty 2.5

## 2013-12-25 MED ORDER — IOHEXOL 300 MG/ML  SOLN
75.0000 mL | Freq: Once | INTRAMUSCULAR | Status: AC | PRN
  Administered 2013-12-25: 70 mL via INTRAVENOUS

## 2013-12-25 MED ORDER — IPRATROPIUM BROMIDE 0.02 % IN SOLN: 0.5000 mg | Freq: Once | RESPIRATORY_TRACT | Status: DC

## 2013-12-25 NOTE — ED Provider Notes (Signed)
CSN: [id number]     Arrival date & time 12/25/13  0929 History  This chart was scribed for [name] Norrie, MD by Ludger Nutting, ED Scribe. This patient was seen in room APA18/AP[facility]18 and the patient's care was started 9:46 AM.    Chief Complaint  Patient presents with  . Wheezing    The history is provided by a relative. The history is limited by the condition of the patient. No language interpreter was used.    Level 5 Caveat-Alzheimer disease   HPI Comments: [name] [name] is a 80 y.o. female who presents to the Emergency Department complaining of increased wheezing that began today and a low oxygen per her grand daughter when she was called today. Pt is a resident at [facility] and is here with her granddaughter. Granddaughter states pt has wheezing at baseline with associated movement or activity. Nursing home called EMS today because patient began having increased wheezing while laying down. She was seen by Dr. Laural Golden yesterday for follow up for GERD flare that she had when admitted to the hospital in December diagnosed with Endoscopy. He felt her wheezing was coming from her throat. She was given a nebulizer for the same symptoms when she got back to her nursing home yesterday which improved slightly with a breathing treatment. She has not complained of chest pain.   EMS gave a nebulizer and solumedrol 125 mg enroute to the ED and her NH gave her a nebulizer treatment this morning also just PTA.   PCP Sallee Lange   Past Medical History  Diagnosis Date  . ASCVD (arteriosclerotic cardiovascular disease) 2003    Single vessel disease-60% D1  . Hypertension   . Hyperlipidemia   . Diabetes mellitus     No insulin  . Leukopenia     and anemia-hematology evaluation was nodiagnostic  . Degenerative joint disease     right TKA in 4/06  . Upper GI bleed     gastric stress ulcer in 2003  . Retinal artery branch occlusion of right eye     Sudden onset of right eye blindness-embolism  suspected  . Gastroesophageal reflux disease with hiatal hernia   . Osteoporosis   . Lung nodule     Left lower lobe  . Parkinson's disease   . Dementia   . Cervical spondylarthritis     S/p discectomy  . Alzheimer disease   . Diverticulosis   . Hiatal hernia   . Nephrolithiasis    Past Surgical History  Procedure Laterality Date  . Total abdominal hysterectomy    . Cervical discectomy      Dr. Sherwood Gambler  . Cholecystectomy    . Total knee arthroplasty  2006    Right; Dr. Percell Miller  . Colonoscopy  2009  . Joint replacement    . Vascular surgery    . Cystoscopy w/ ureteral stent placement  09/23/2011    Procedure: CYSTOSCOPY WITH RETROGRADE PYELOGRAM/URETERAL STENT PLACEMENT;  Surgeon: Marissa Nestle;  Location: [facility];  Service: Urology;  Laterality: Right;  . Esophagogastroduodenoscopy N/A 10/31/2013    Procedure: ESOPHAGOGASTRODUODENOSCOPY (EGD);  Surgeon: Rogene Houston, MD;  Location: [facility] SUITE;  Service: Endoscopy;  Laterality: N/A;   Family History  Problem Relation Age of Onset  . Diabetes Sister   . Heart disease Sister   . Diabetes Brother    History  Substance Use Topics  . Smoking status: Never Smoker   . Smokeless tobacco: Never Used  . Alcohol Use: No  Pt lives in [place]  OB History   Grav Para Term Preterm Abortions TAB SAB Ect Mult Living                 Review of Systems  Unable to perform ROS: Dementia    Allergies  Codeine and Morphine  Home Medications   Current Outpatient Rx  Name  Route  Sig  Dispense  Refill  . albuterol (PROAIR HFA) 108 (90 BASE) MCG/ACT inhaler   Inhalation   Inhale 2 puffs into the lungs every 4 (four) hours as needed for wheezing or shortness of breath.   3.7 g   0   . albuterol (PROVENTIL) (2.5 MG/3ML) 0.083% nebulizer solution   Nebulization   Take 3 mLs (2.5 mg total) by nebulization every 4 (four) hours as needed for wheezing or shortness of breath.   30 vial   1   . ASPIRIN LOW DOSE 81 MG EC  tablet      TAKE 1 TABLET BY MOUTH ONCE DAILY.   30 tablet   3   . atropine 1 % ophthalmic solution   Right Eye   Place 1 drop into the right eye daily.          . citalopram (CELEXA) 20 MG tablet   Oral   Take 20 mg by mouth daily.         . clotrimazole-betamethasone (LOTRISONE) cream   Topical   Apply 1 application topically daily as needed.          . Difluprednate (DUREZOL) 0.05 % EMUL   Right Eye   Place 1 drop into the right eye daily.         Marland Kitchen donepezil (ARICEPT) 5 MG tablet   Oral   Take 5 mg by mouth every morning.         . loratadine (CLARITIN) 10 MG tablet   Oral   Take 10 mg by mouth daily.         Marland Kitchen losartan (COZAAR) 25 MG tablet      TAKE 1 TABLET BY MOUTH ONCE DAILY.   30 tablet   5   . memantine (NAMENDA) 10 MG tablet   Oral   Take 10 mg by mouth 2 (two) times daily.         . methylcellulose (ARTIFICIAL TEARS) 1 % ophthalmic solution   Both Eyes   Place 1 drop into both eyes 3 (three) times daily.          . mometasone (ELOCON) 0.1 % cream   Topical   Apply 1 application topically 2 (two) times daily as needed (for irritation).         Marland Kitchen omeprazole (PRILOSEC) 40 MG capsule      TAKE 1 CAPSULE ONCE DAILY FOR ACID REFLUX.   30 capsule   5   . ondansetron (ZOFRAN-ODT) 4 MG disintegrating tablet      DISSOLVE 1 TABLET IN MOOUTH EVERY 6 HOURS AS NEEDED FOR NAUSEA.   6 tablet   6   . polyethylene glycol (MIRALAX / GLYCOLAX) packet   Oral   Take 17 g by mouth daily. *Mixed with 8 ounces of water/juice daily*         . primidone (MYSOLINE) 50 MG tablet   Oral   Take 0.5 tablets (25 mg total) by mouth at bedtime.   15 tablet   5   . sucralfate (CARAFATE) 1 G tablet   Oral   Take 2 tablets (2  g total) by mouth daily at 10 pm. *to be crushed in small amount of liquid*   60 tablet   2   . Vitamin B Complex-C CAPS   Oral   Take 1 capsule by mouth daily.   30 each   12   . vitamin E 400 UNIT capsule       TAKE 1 CAPSULE BY MOUTH ONCE DAILY.   30 capsule   5    BP 133/53  Pulse 83  Temp(Src) 97.6 F (36.4 C) (Oral)  Resp 18  SpO2 96% on RA  Vital signs normal    Physical Exam  Nursing note and vitals reviewed. Constitutional: She appears well-developed and well-nourished.  Non-toxic appearance. She does not appear ill. No distress.  Limited verbal communication.  HENT:  Head: Normocephalic and atraumatic.  Right Ear: External ear normal.  Left Ear: External ear normal.  Nose: Nose normal. No mucosal edema or rhinorrhea.  Mouth/Throat: Oropharynx is clear and moist and mucous membranes are normal. No dental abscesses or uvula swelling.  Tongue moist.    Eyes: Conjunctivae are normal.  Right eye sunken and has haziness of the cornea.  Left pupil is slightly enlarged and non reactive.    Neck: Normal range of motion and full passive range of motion without pain. Neck supple.  Cardiovascular: Normal rate, regular rhythm and normal heart sounds.  Exam reveals no gallop and no friction rub.   No murmur heard. Pulmonary/Chest: Effort normal. No respiratory distress. She has wheezes. She has no rhonchi. She has no rales. She exhibits no tenderness and no crepitus.  Audible wheezing. Hard to tell if from throat or lungs.   Abdominal: Soft. Normal appearance and bowel sounds are normal. She exhibits no distension. There is no tenderness. There is no rebound and no guarding.  Musculoskeletal: Normal range of motion. She exhibits no edema and no tenderness.  Moves all extremities well.   Neurological: She is alert. She has normal strength. No cranial nerve deficit.  Skin: Skin is warm, dry and intact. No rash noted. No erythema. No pallor.  Psychiatric: She has a normal mood and affect. Her speech is normal and behavior is normal. Her mood appears not anxious.    ED Course  Procedures (including critical care time)  DIAGNOSTIC STUDIES: Oxygen Saturation is 100% on RA, normal by my  interpretation.    COORDINATION OF CARE: 9:56 AM Discussed treatment plan with family at bedside and they agreed to plan.  12:05 PM Upon recheck, pt has no audible wheezing. Only has late end expiratory wheeze. Will receive 1 additional breathing treatment. Discussed imaging and lab results with family.   12:45 recheck after second nebulizer and she has audible wheezing again on expiration. Listening over her throat is the loudest c/w laryngospasm    13:17 D/W Dr Sarajane Jews, suggests talking to Dr Luan Pulling and if he wants to do bronchoscopy in the hospital he will admit   13:29 Dr Luan Pulling, suggests getting soft tissue CT of neck to look for narrowing to see if bronchoscopy would be diagnostic  15:03 Discussed CT results with Dr Luan Pulling, he recommends increasing her PPI to twice a day, put on steroids, and have ENT evaluate.   Family is agreeable to CT scan.   15:03 Discussed CT results with Dr Luan Pulling, states to refer to ENT, he does not think bronchscopy will be helpful. States to start on steroids and increase her PPI  Discussed CT results with family. States her insurance will  not pay for the Omeprazole 40 mg to be given twice a day, this was discussed with Dr Laural Golden yesterday and her PCP Dr Wolfgang Phoenix.   15:14 Dr Deeann Saint office can have an appt tomorrow at his [place] office at 3:20 pm   Labs Review Results for orders placed during the hospital encounter of 12/25/13  CBC WITH DIFFERENTIAL      Result Value Range   WBC 6.9  4.0 - 10.5 K/uL   RBC 3.76 (*) 3.87 - 5.11 MIL/uL   Hemoglobin 12.6  12.0 - 15.0 g/dL   HCT 38.3  36.0 - 46.0 %   MCV 101.9 (*) 78.0 - 100.0 fL   MCH 33.5  26.0 - 34.0 pg   MCHC 32.9  30.0 - 36.0 g/dL   RDW 13.3  11.5 - 15.5 %   Platelets    150 - 400 K/uL   Value: PLATELET CLUMPS NOTED ON SMEAR, COUNT APPEARS ADEQUATE   Neutrophils Relative % 64  43 - 77 %   Neutro Abs 4.4  1.7 - 7.7 K/uL   Lymphocytes Relative 27  12 - 46 %   Lymphs Abs 1.8  0.7 - 4.0 K/uL    Monocytes Relative 6  3 - 12 %   Monocytes Absolute 0.4  0.1 - 1.0 K/uL   Eosinophils Relative 3  0 - 5 %   Eosinophils Absolute 0.2  0.0 - 0.7 K/uL   Basophils Relative 1  0 - 1 %   Basophils Absolute 0.0  0.0 - 0.1 K/uL  COMPREHENSIVE METABOLIC PANEL      Result Value Range   Sodium 140  137 - 147 mEq/L   Potassium 4.5  3.7 - 5.3 mEq/L   Chloride 103  96 - 112 mEq/L   CO2 25  19 - 32 mEq/L   Glucose, Bld 151 (*) 70 - 99 mg/dL   BUN 20  6 - 23 mg/dL   Creatinine, Ser 1.06  0.50 - 1.10 mg/dL   Calcium 9.3  8.4 - 10.5 mg/dL   Total Protein 7.3  6.0 - 8.3 g/dL   Albumin 3.6  3.5 - 5.2 g/dL   AST 18  0 - 37 U/L   ALT 19  0 - 35 U/L   Alkaline Phosphatase 53  39 - 117 U/L   Total Bilirubin 0.2 (*) 0.3 - 1.2 mg/dL   GFR calc non Af Amer 46 (*) >90 mL/min   GFR calc Af Amer 53 (*) >90 mL/min  PRO B NATRIURETIC PEPTIDE      Result Value Range   Pro B Natriuretic peptide (BNP) 570.7 (*) 0 - 450 pg/mL  TROPONIN I      Result Value Range   Troponin I <0.30  <0.30 ng/mL   Laboratory interpretation all normal except mild elevation of BNP, hyperglycemia   Imaging Review Dg Chest Portable 1 View  12/25/2013   CLINICAL DATA:  Wheezing, history hypertension, diabetes, Alzheimer's, Parkinson's  EXAM: PORTABLE CHEST - 1 VIEW  COMPARISON:  Portable exam 1019 hr compared to 11/22/2013  FINDINGS: Upper normal heart size.  Tortuous aorta.  Mediastinal contours and pulmonary vascularity normal.  Lungs clear.  No pleural effusion or pneumothorax.  Bones demineralized.  IMPRESSION: No acute abnormalities.   Electronically Signed   By: Lavonia Dana M.D.   On: 12/25/2013 10:26    EKG Interpretation    Date/Time:  Wednesday December 25 2013 10:36:33 EST Ventricular Rate:  78 PR Interval:  230 QRS Duration: 142  QT Interval:  406 QTC Calculation: 462 R Axis:   64 Text Interpretation:  Sinus rhythm with 1st degree A-V block Left bundle branch block When compared with ECG of 22-Nov-2013 07:53,  Questionable change in QRS axis Confirmed by Donice Alperin  MD-I, Khilynn Borntreger ([id number]) on 12/25/2013 11:12:28 AM             MDM   1. Laryngospasm   2. GERD (gastroesophageal reflux disease)    New Prescriptions   OMEPRAZOLE (PRILOSEC) 40 MG CAPSULE    Take 1 po BID   PREDNISONE (DELTASONE) 20 MG TABLET    Take 3 po QD x 2d starting tomorrow, then 2 po QD x 3d then 1 po QD x 3d    Plan discharge   Rolland Porter, MD, FACEP   I personally performed the services described in this documentation, which was scribed in my presence. The recorded information has been reviewed and considered.  Rolland Porter, MD, FACEP    [name] Norrie, MD 12/25/13 [phone]

## 2013-12-25 NOTE — ED Notes (Signed)
Pt sent by [facility] for wheezing. Albuterol given at nursing home and repeated en route. Pt was also given SoluMedrol 125 IV en route. Audible wheezing noted.

## 2013-12-25 NOTE — ED Notes (Signed)
Pt very out of breath and anxious when put into car. Comfort measures given which helped some. EDP aware and was advised that other pcps stated they though she could go, wheezing is upper airway/throat and will get better when she can rest and calm down. Advised family of this. NAD

## 2013-12-25 NOTE — Discharge Instructions (Signed)
She needs to be on the omeprazole twice a day, you can supplement with OTC meds if necessary. Start the prednisone tomorrow. Use her nebulizers as needed for wheezing. Keep her head elevated, this will help with her reflux symptoms which is probably making her have the wheezing. Use her nebulizer as needed for the wheezing. Return if she is struggling to breathe or seems worse.    Gastroesophageal Reflux Disease, Adult Gastroesophageal reflux disease (GERD) happens when acid from your stomach flows up into the esophagus. When acid comes in contact with the esophagus, the acid causes soreness (inflammation) in the esophagus. Over time, GERD may create small holes (ulcers) in the lining of the esophagus. CAUSES   Increased body weight. This puts pressure on the stomach, making acid rise from the stomach into the esophagus.  Smoking. This increases acid production in the stomach.  Drinking alcohol. This causes decreased pressure in the lower esophageal sphincter (valve or ring of muscle between the esophagus and stomach), allowing acid from the stomach into the esophagus.  Late evening meals and a full stomach. This increases pressure and acid production in the stomach.  A malformed lower esophageal sphincter. Sometimes, no cause is found. SYMPTOMS   Burning pain in the lower part of the mid-chest behind the breastbone and in the mid-stomach area. This may occur twice a week or more often.  Trouble swallowing.  Sore throat.  Dry cough.  Asthma-like symptoms including chest tightness, shortness of breath, or wheezing. DIAGNOSIS  Your caregiver may be able to diagnose GERD based on your symptoms. In some cases, X-rays and other tests may be done to check for complications or to check the condition of your stomach and esophagus. TREATMENT  Your caregiver may recommend over-the-counter or prescription medicines to help decrease acid production. Ask your caregiver before starting or adding any  new medicines.  HOME CARE INSTRUCTIONS   Change the factors that you can control. Ask your caregiver for guidance concerning weight loss, quitting smoking, and alcohol consumption.  Avoid foods and drinks that make your symptoms worse, such as:  Caffeine or alcoholic drinks.  Chocolate.  Peppermint or mint flavorings.  Garlic and onions.  Spicy foods.  Citrus fruits, such as oranges, lemons, or limes.  Tomato-based foods such as sauce, chili, salsa, and pizza.  Fried and fatty foods.  Avoid lying down for the 3 hours prior to your bedtime or prior to taking a nap.  Eat small, frequent meals instead of large meals.  Wear loose-fitting clothing. Do not wear anything tight around your waist that causes pressure on your stomach.  Raise the head of your bed 6 to 8 inches with wood blocks to help you sleep. Extra pillows will not help.  Only take over-the-counter or prescription medicines for pain, discomfort, or fever as directed by your caregiver.  Do not take aspirin, ibuprofen, or other nonsteroidal anti-inflammatory drugs (NSAIDs). SEEK IMMEDIATE MEDICAL CARE IF:   You have pain in your arms, neck, jaw, teeth, or back.  Your pain increases or changes in intensity or duration.  You develop nausea, vomiting, or sweating (diaphoresis).  You develop shortness of breath, or you faint.  Your vomit is green, yellow, black, or looks like coffee grounds or blood.  Your stool is red, bloody, or black. These symptoms could be signs of other problems, such as heart disease, gastric bleeding, or esophageal bleeding. MAKE SURE YOU:   Understand these instructions.  Will watch your condition.  Will get help right away  if you are not doing well or get worse. Document Released: 08/17/2005 Document Revised: 01/30/2012 Document Reviewed: 05/27/2011 [facility] Patient Information 2014 [place], [place].

## 2013-12-26 ENCOUNTER — Ambulatory Visit (INDEPENDENT_AMBULATORY_CARE_PROVIDER_SITE_OTHER): Payer: Medicare Other | Admitting: Otolaryngology

## 2013-12-26 DIAGNOSIS — R070 Pain in throat: Secondary | ICD-10-CM | POA: Diagnosis not present

## 2013-12-27 ENCOUNTER — Encounter (INDEPENDENT_AMBULATORY_CARE_PROVIDER_SITE_OTHER): Payer: Medicare Other | Admitting: Ophthalmology

## 2013-12-27 DIAGNOSIS — H35039 Hypertensive retinopathy, unspecified eye: Secondary | ICD-10-CM | POA: Diagnosis not present

## 2013-12-27 DIAGNOSIS — I10 Essential (primary) hypertension: Secondary | ICD-10-CM | POA: Diagnosis not present

## 2013-12-27 DIAGNOSIS — H26499 Other secondary cataract, unspecified eye: Secondary | ICD-10-CM

## 2013-12-27 DIAGNOSIS — H43819 Vitreous degeneration, unspecified eye: Secondary | ICD-10-CM | POA: Diagnosis not present

## 2014-01-01 DIAGNOSIS — E1159 Type 2 diabetes mellitus with other circulatory complications: Secondary | ICD-10-CM | POA: Diagnosis not present

## 2014-01-03 ENCOUNTER — Institutional Professional Consult (permissible substitution): Payer: Medicare Other | Admitting: Emergency Medicine

## 2014-01-06 ENCOUNTER — Other Ambulatory Visit: Payer: Self-pay | Admitting: Family Medicine

## 2014-01-06 ENCOUNTER — Ambulatory Visit (INDEPENDENT_AMBULATORY_CARE_PROVIDER_SITE_OTHER): Payer: Medicare Other | Admitting: Ophthalmology

## 2014-01-06 DIAGNOSIS — H2700 Aphakia, unspecified eye: Secondary | ICD-10-CM

## 2014-01-09 ENCOUNTER — Telehealth: Payer: Self-pay | Admitting: Family Medicine

## 2014-01-09 NOTE — Telephone Encounter (Signed)
Rx prior auth obtained for pt's ONDANSETRON (Zofran) ODT 4mg  tablet #6/2days, expires 12/25/2015 through [facility], faxed approval to [facility]

## 2014-01-10 ENCOUNTER — Other Ambulatory Visit (INDEPENDENT_AMBULATORY_CARE_PROVIDER_SITE_OTHER): Payer: Medicare Other

## 2014-01-10 ENCOUNTER — Encounter: Payer: Self-pay | Admitting: Internal Medicine

## 2014-01-10 ENCOUNTER — Ambulatory Visit (INDEPENDENT_AMBULATORY_CARE_PROVIDER_SITE_OTHER): Payer: Medicare Other | Admitting: Internal Medicine

## 2014-01-10 ENCOUNTER — Ambulatory Visit: Payer: Medicare Other | Admitting: Urology

## 2014-01-10 VITALS — BP 118/72 | HR 68 | Ht 61.0 in | Wt 170.8 lb

## 2014-01-10 DIAGNOSIS — R062 Wheezing: Secondary | ICD-10-CM | POA: Diagnosis not present

## 2014-01-10 DIAGNOSIS — R0989 Other specified symptoms and signs involving the circulatory and respiratory systems: Secondary | ICD-10-CM | POA: Diagnosis not present

## 2014-01-10 DIAGNOSIS — R0600 Dyspnea, unspecified: Secondary | ICD-10-CM

## 2014-01-10 DIAGNOSIS — R0609 Other forms of dyspnea: Secondary | ICD-10-CM | POA: Diagnosis not present

## 2014-01-10 LAB — BRAIN NATRIURETIC PEPTIDE: Pro B Natriuretic peptide (BNP): 67 pg/mL (ref 0.0–100.0)

## 2014-01-10 MED ORDER — ALBUTEROL SULFATE (2.5 MG/3ML) 0.083% IN NEBU: 2.5000 mg | INHALATION_SOLUTION | Freq: Four times a day (QID) | RESPIRATORY_TRACT | Status: DC

## 2014-01-10 MED ORDER — OLMESARTAN MEDOXOMIL 20 MG PO TABS: 20.0000 mg | ORAL_TABLET | Freq: Every day | ORAL | Status: DC

## 2014-01-10 MED ORDER — BUDESONIDE 0.25 MG/2ML IN SUSP: 0.2500 mg | Freq: Two times a day (BID) | RESPIRATORY_TRACT | Status: DC

## 2014-01-10 MED ORDER — IPRATROPIUM BROMIDE 0.02 % IN SOLN: 0.5000 mg | Freq: Four times a day (QID) | RESPIRATORY_TRACT | Status: DC

## 2014-01-10 NOTE — Progress Notes (Signed)
Subjective:    Patient ID: [name] [name], female    DOB: 1924/06/01, 80 y.o.   MRN: [id number] Chuck Hint, MD  HPI  IOV 01/10/2014 Chief Complaint  Patient presents with  . Pulmonary Consult    for wheezing and SOB with activity. This has progressed to now that pt is SOB and wheezing at rest as well. x 3 months.     80 year old female brought in by her daughter and granddaughter. Patient has advanced dementia [wheelchair-bound at baseline and nonverbal but not incontinent]. She lives in a nursing facility. Family's main concern is persistent wheezing. The wheezing is causing distress to the patient and the family. In addition this is resulting in patient being sent to the emergency room repeatedly. Family finds that recurrent ER visits for wheezing is upsetting patient's patient's psychosocial balance and want to wheezing addressed both in terms of cause and symptoms.  They report wheezing for at least a few years even before she got into the nursing home couple of years ago. At times the wheezing was mostly exertional and relieved by rest and this was never investigated. And she was never on any treatment. For the last few to several months the wheezing has been even at rest. It is resulted in 2 emergency room visits and admissions and one of them. Apparently December admission to [facility] acid reflux was suggested as a cause [patient is known to have acid reflux with hiatal hernia]. Back in December 2014 she did have some vomiting and the suspicion was viral gastroenteritis versus patient's moderate-sized hiatal hernia along with dysphagia. However the family think patient does not have dysphagia. Patient on a chopped diet and she does not aspirate according to the family. However, one time the granddaughter did notice that after eating patient was clutching her throat and was getting short of breath and wheezing. She did have a yet evaluation in February 2015 with Dr. Ilona Sorrel that showed  normal CT neck [document below] and normal examination according to the history.  Currently wheezing is moderate in intensity and is random at rest. There is no clear cut relieving factors. Relocated aggravating factors. This was a sitter dyspnea but this is hard to evaluate because of patient immobility. No prior heart problems there is associated with this. No sustained prior smoking. Unclear about dysphagia. Wheezing is noticed with associated losartan intake but she not on ACE inhibitor's. There is no associated nebulizer treatment for wheezing  cxr 12/25/13 - clear. Osteopneia + cxr neck 24//15 - normal, reviewd  Past Medical History  Diagnosis Date  . ASCVD (arteriosclerotic cardiovascular disease) 2003    Single vessel disease-60% D1  . Hypertension   . Hyperlipidemia   . Diabetes mellitus     No insulin  . Leukopenia     and anemia-hematology evaluation was nodiagnostic  . Degenerative joint disease     right TKA in 4/06  . Upper GI bleed     gastric stress ulcer in 2003  . Retinal artery branch occlusion of right eye     Sudden onset of right eye blindness-embolism suspected  . Gastroesophageal reflux disease with hiatal hernia   . Osteoporosis   . Lung nodule     Left lower lobe  . Parkinson's disease   . Dementia   . Cervical spondylarthritis     S/p discectomy  . Alzheimer disease   . Diverticulosis   . Hiatal hernia   . Nephrolithiasis      Family History  Problem Relation Age of Onset  . Diabetes Sister   . Heart disease Sister   . Diabetes Brother      History   Social History  . Marital Status: Widowed    Spouse Name: N/A    Number of Children: N/A  . Years of Education: N/A   Occupational History  . Not on file.   Social History Main Topics  . Smoking status: Never Smoker   . Smokeless tobacco: Never Used  . Alcohol Use: No  . Drug Use: No  . Sexual Activity: No   Other Topics Concern  . Not on file   Social History Narrative   Retired  from [employer]  . Codeine Other (See Comments)    REACTION: Unknown  . Morphine Nausea And Vomiting     Outpatient Prescriptions Prior to Visit  Medication Sig Dispense Refill  . albuterol (PROAIR HFA) 108 (90 BASE) MCG/ACT inhaler Inhale 2 puffs into the lungs every 4 (four) hours as needed for wheezing or shortness of breath.  3.7 g  0  . albuterol (PROVENTIL) (2.5 MG/3ML) 0.083% nebulizer solution Take 3 mLs (2.5 mg total) by nebulization every 4 (four) hours as needed for wheezing or shortness of breath.  30 vial  1  . ASPIRIN LOW DOSE 81 MG EC tablet TAKE 1 TABLET BY MOUTH ONCE DAILY.  30 tablet  3  . citalopram (CELEXA) 20 MG tablet TAKE 1 TABLET BY MOUTH ONCE DAILY.(FOR DEPRESSION)  30 tablet  5  . clotrimazole-betamethasone (LOTRISONE) cream Apply 1 application topically daily as needed.       . Difluprednate (DUREZOL) 0.05 % EMUL Place 1 drop into the right eye daily.      Marland Kitchen donepezil (ARICEPT) 5 MG tablet Take 5 mg by mouth every morning.      . loratadine (CLARITIN) 10 MG tablet Take 10 mg by mouth daily.      Marland Kitchen losartan (COZAAR) 25 MG tablet TAKE 1 TABLET BY MOUTH ONCE DAILY.  30 tablet  5  . memantine (NAMENDA) 10 MG tablet Take 10 mg by mouth 2 (two) times daily.      . methylcellulose (ARTIFICIAL TEARS) 1 % ophthalmic solution Place 1 drop into both eyes 3 (three) times daily.       . mometasone (ELOCON) 0.1 % cream Apply 1 application topically 2 (two) times daily as needed (for irritation).      . ondansetron (ZOFRAN-ODT) 4 MG disintegrating tablet DISSOLVE 1 TABLET IN MOOUTH EVERY 6 HOURS AS NEEDED FOR NAUSEA.  6 tablet  6  . polyethylene glycol (MIRALAX / GLYCOLAX) packet Take 17 g by mouth daily. *Mixed with 8 ounces of water/juice daily*      . primidone (MYSOLINE) 50 MG tablet Take 0.5 tablets (25 mg total) by mouth at bedtime.  15 tablet  5  . sucralfate (CARAFATE) 1 G tablet Take 2 tablets (2 g total) by mouth daily at  10 pm. *to be crushed in small amount of liquid*  60 tablet  2  . Vitamin B Complex-C CAPS Take 1 capsule by mouth daily.  30 each  12  . vitamin E 400 UNIT capsule TAKE 1 CAPSULE BY MOUTH ONCE DAILY.  30 capsule  5  . atropine 1 % ophthalmic solution Place 1 drop into the right eye daily.       Marland Kitchen omeprazole (PRILOSEC) 40 MG capsule TAKE 1 CAPSULE ONCE DAILY FOR ACID REFLUX.  30 capsule  5  . omeprazole (PRILOSEC) 40 MG capsule Take 1 po BID  60 capsule  0  . predniSONE (DELTASONE) 20 MG tablet Take 3 po QD x 2d starting tomorrow, then 2 po QD x 3d then 1 po QD x 3d  15 tablet  0   No facility-administered medications prior to visit.       Review of Systems  Constitutional: Negative for fever and unexpected weight change.  HENT: Negative for congestion, dental problem, ear pain, nosebleeds, postnasal drip, rhinorrhea, sinus pressure, sneezing, sore throat and trouble swallowing.   Eyes: Negative for redness and itching.  Respiratory: Positive for shortness of breath and wheezing. Negative for cough and chest tightness.   Cardiovascular: Negative for palpitations and leg swelling.  Gastrointestinal: Negative for nausea and vomiting.  Genitourinary: Negative for dysuria.  Musculoskeletal: Negative for joint swelling.  Skin: Negative for rash.  Neurological: Negative for headaches.  Hematological: Does not bruise/bleed easily.  Psychiatric/Behavioral: Negative for dysphoric mood. The patient is not nervous/anxious.        Objective:   Physical Exam  Vitals reviewed. Constitutional: She is oriented to person, place, and time. She appears well-developed and well-nourished. No distress.  Sitting in wheel chair Non verbal ro minimally verbal Smiling Pleasant Obese  HENT:  Head: Normocephalic and atraumatic.  Right Ear: External ear normal.  Left Ear: External ear normal.  Mouth/Throat: Oropharynx is clear and moist. No oropharyngeal exudate.  Eyes: Conjunctivae and EOM are  normal. Pupils are equal, round, and reactive to light. Right eye exhibits no discharge. Left eye exhibits no discharge. No scleral icterus.  Neck: Normal range of motion. Neck supple. No JVD present. No tracheal deviation present. No thyromegaly present.  Cardiovascular: Normal rate, regular rhythm, normal heart sounds and intact distal pulses.  Exam reveals no gallop and no friction rub.   No murmur heard. Pulmonary/Chest: Effort normal. No respiratory distress. She has wheezes. She has no rales. She exhibits no tenderness.  Bilateral wheeze +  Abdominal: Soft. Bowel sounds are normal. She exhibits no distension and no mass. There is no tenderness. There is no rebound and no guarding.  Musculoskeletal: Normal range of motion. She exhibits no edema and no tenderness.  sitsin wheel chair Does not ambulate  Lymphadenopathy:    She has no cervical adenopathy.  Neurological: She is alert and oriented to person, place, and time. She has normal reflexes. No cranial nerve deficit. She exhibits normal muscle tone. Coordination normal.  Skin: Skin is warm and dry. No rash noted. She is not diaphoretic. No erythema. No pallor.  Psychiatric:  dementia          Assessment & Plan:

## 2014-01-10 NOTE — Assessment & Plan Note (Signed)
Persistent Wheezing hard to sort out cause due to dementia but goal is to avoid distress and ER visits. [place] [facility] Eval with CT and Dr Benjamine Mola is normal  PLAN   Do Echo of heart and BNP blood test - rule out CHF Do CT scan chest   High Resolution CT chest without contrast on ILD protocol. Only  Dr Lorin Picket or Dr. Vinnie Langton to read Stop losartan. STart benicar 20mg  daily Bedside swallow eval at nursing home; if not possible refer speech therapy Start   - combivent neb 4 times daily + albuterol as needed  - pulmicort 0.25mg /mL twice daily neb  #Followup  - will call with results  - 4 weeks

## 2014-01-10 NOTE — Patient Instructions (Signed)
Persistent Wheezing hard to sort out; [place] [facility] Eval with CT and Dr Benjamine Mola is normal Do Echo of heart and BNP blood test - rule out CHF Do CT scan chest   High Resolution CT chest without contrast on ILD protocol. Only  Dr Lorin Picket or Dr. Vinnie Langton to read Stop losartan. STart benicar 20mg  daily Bedside swallow eval at nursing home; if not possible refer speech therapy Start   - combivent neb 4 times daily + albuterol as needed  - pulmicort 0.25mg /mL twice daily neb  #Followup  - will call with results  - 4 weeks

## 2014-01-12 ENCOUNTER — Emergency Department (HOSPITAL_COMMUNITY)
Admission: EM | Admit: 2014-01-12 | Discharge: 2014-01-12 | Disposition: A | Discharge status: Home / Self Care | Payer: Medicare Other | Attending: Emergency Medicine | Admitting: Emergency Medicine

## 2014-01-12 ENCOUNTER — Encounter (HOSPITAL_COMMUNITY): Payer: Self-pay | Admitting: Emergency Medicine

## 2014-01-12 ENCOUNTER — Emergency Department (HOSPITAL_COMMUNITY): Payer: Medicare Other

## 2014-01-12 ENCOUNTER — Emergency Department (HOSPITAL_COMMUNITY)
Admission: EM | Admit: 2014-01-12 | Discharge: 2014-01-12 | Disposition: A | Discharge status: Home / Self Care | Payer: Medicare Other | Source: Home / Self Care | Attending: Emergency Medicine | Admitting: Emergency Medicine

## 2014-01-12 DIAGNOSIS — I10 Essential (primary) hypertension: Secondary | ICD-10-CM

## 2014-01-12 DIAGNOSIS — M1990 Unspecified osteoarthritis, unspecified site: Secondary | ICD-10-CM | POA: Diagnosis not present

## 2014-01-12 DIAGNOSIS — E119 Type 2 diabetes mellitus without complications: Secondary | ICD-10-CM | POA: Insufficient documentation

## 2014-01-12 DIAGNOSIS — G309 Alzheimer's disease, unspecified: Secondary | ICD-10-CM | POA: Insufficient documentation

## 2014-01-12 DIAGNOSIS — Z79899 Other long term (current) drug therapy: Secondary | ICD-10-CM | POA: Insufficient documentation

## 2014-01-12 DIAGNOSIS — Z7982 Long term (current) use of aspirin: Secondary | ICD-10-CM | POA: Insufficient documentation

## 2014-01-12 DIAGNOSIS — F0390 Unspecified dementia without behavioral disturbance: Secondary | ICD-10-CM | POA: Diagnosis not present

## 2014-01-12 DIAGNOSIS — E785 Hyperlipidemia, unspecified: Secondary | ICD-10-CM | POA: Insufficient documentation

## 2014-01-12 DIAGNOSIS — M47812 Spondylosis without myelopathy or radiculopathy, cervical region: Secondary | ICD-10-CM

## 2014-01-12 DIAGNOSIS — F411 Generalized anxiety disorder: Secondary | ICD-10-CM | POA: Diagnosis not present

## 2014-01-12 DIAGNOSIS — R062 Wheezing: Secondary | ICD-10-CM | POA: Diagnosis not present

## 2014-01-12 DIAGNOSIS — G20 Parkinson's disease: Secondary | ICD-10-CM

## 2014-01-12 DIAGNOSIS — F0280 Dementia in other diseases classified elsewhere without behavioral disturbance: Secondary | ICD-10-CM | POA: Insufficient documentation

## 2014-01-12 DIAGNOSIS — Z87442 Personal history of urinary calculi: Secondary | ICD-10-CM | POA: Insufficient documentation

## 2014-01-12 DIAGNOSIS — G20A1 Parkinson's disease without dyskinesia, without mention of fluctuations: Secondary | ICD-10-CM | POA: Insufficient documentation

## 2014-01-12 DIAGNOSIS — IMO0002 Reserved for concepts with insufficient information to code with codable children: Secondary | ICD-10-CM | POA: Insufficient documentation

## 2014-01-12 DIAGNOSIS — Z862 Personal history of diseases of the blood and blood-forming organs and certain disorders involving the immune mechanism: Secondary | ICD-10-CM

## 2014-01-12 DIAGNOSIS — Z8669 Personal history of other diseases of the nervous system and sense organs: Secondary | ICD-10-CM | POA: Insufficient documentation

## 2014-01-12 DIAGNOSIS — Z8709 Personal history of other diseases of the respiratory system: Secondary | ICD-10-CM | POA: Diagnosis not present

## 2014-01-12 DIAGNOSIS — R011 Cardiac murmur, unspecified: Secondary | ICD-10-CM

## 2014-01-12 DIAGNOSIS — K219 Gastro-esophageal reflux disease without esophagitis: Secondary | ICD-10-CM | POA: Insufficient documentation

## 2014-01-12 DIAGNOSIS — R6889 Other general symptoms and signs: Secondary | ICD-10-CM | POA: Diagnosis not present

## 2014-01-12 DIAGNOSIS — Z8673 Personal history of transient ischemic attack (TIA), and cerebral infarction without residual deficits: Secondary | ICD-10-CM | POA: Insufficient documentation

## 2014-01-12 DIAGNOSIS — J40 Bronchitis, not specified as acute or chronic: Secondary | ICD-10-CM | POA: Diagnosis not present

## 2014-01-12 DIAGNOSIS — R0602 Shortness of breath: Secondary | ICD-10-CM | POA: Diagnosis not present

## 2014-01-12 HISTORY — DX: Elevated white blood cell count, unspecified: D72.829

## 2014-01-12 HISTORY — DX: Gastrointestinal hemorrhage, unspecified: K92.2

## 2014-01-12 HISTORY — DX: Unspecified dementia, unspecified severity, without behavioral disturbance, psychotic disturbance, mood disturbance, and anxiety: F03.90

## 2014-01-12 HISTORY — DX: Alzheimer's disease, unspecified: G30.9

## 2014-01-12 HISTORY — DX: Dementia in other diseases classified elsewhere, unspecified severity, without behavioral disturbance, psychotic disturbance, mood disturbance, and anxiety: F02.80

## 2014-01-12 LAB — CBC
HCT: 36.2 % (ref 36.0–46.0)
HEMOGLOBIN: 11.7 g/dL — AB (ref 12.0–15.0)
MCH: 33.1 pg (ref 26.0–34.0)
MCHC: 32.3 g/dL (ref 30.0–36.0)
MCV: 102.3 fL — AB (ref 78.0–100.0)
PLATELETS: 190 10*3/uL (ref 150–400)
RBC: 3.54 MIL/uL — ABNORMAL LOW (ref 3.87–5.11)
RDW: 13.5 % (ref 11.5–15.5)
WBC: 6.7 10*3/uL (ref 4.0–10.5)

## 2014-01-12 LAB — BASIC METABOLIC PANEL
BUN: 17 mg/dL (ref 6–23)
CALCIUM: 9.3 mg/dL (ref 8.4–10.5)
CO2: 28 meq/L (ref 19–32)
CREATININE: 1.11 mg/dL — AB (ref 0.50–1.10)
Chloride: 102 mEq/L (ref 96–112)
GFR calc Af Amer: 50 mL/min — ABNORMAL LOW (ref >90)
GFR calc non Af Amer: 43 mL/min — ABNORMAL LOW (ref >90)
GLUCOSE: 149 mg/dL — AB (ref 70–99)
Potassium: 4.6 mEq/L (ref 3.7–5.3)
Sodium: 140 mEq/L (ref 137–147)

## 2014-01-12 MED ORDER — ALBUTEROL SULFATE (2.5 MG/3ML) 0.083% IN NEBU
5.0000 mg | INHALATION_SOLUTION | Freq: Once | RESPIRATORY_TRACT | Status: AC
  Administered 2014-01-12: 5 mg via RESPIRATORY_TRACT
  Filled 2014-01-12: qty 6

## 2014-01-12 MED ORDER — ALBUTEROL SULFATE (2.5 MG/3ML) 0.083% IN NEBU
2.5000 mg | INHALATION_SOLUTION | Freq: Once | RESPIRATORY_TRACT | Status: AC
  Administered 2014-01-12: 2.5 mg via RESPIRATORY_TRACT
  Filled 2014-01-12: qty 3

## 2014-01-12 MED ORDER — PREDNISONE 20 MG PO TABS: ORAL_TABLET | ORAL | Status: DC

## 2014-01-12 MED ORDER — PREDNISONE 50 MG PO TABS
60.0000 mg | ORAL_TABLET | Freq: Once | ORAL | Status: AC
  Administered 2014-01-12: 60 mg via ORAL
  Filled 2014-01-12 (×2): qty 1

## 2014-01-12 NOTE — ED Notes (Signed)
EMS reports pt was recently discharged back to [facility] a couple of hours ago and was sent back for SOB.  EMS reports pt has upper airway wheezing and very anxious.  Pt confused but EMS reports is pt's baseline.

## 2014-01-12 NOTE — ED Notes (Signed)
Short of breath onset 0300 today per ems, breathing treatment in route, resident of [place]

## 2014-01-12 NOTE — ED Notes (Signed)
In room to d/c patient and pt wheezing again.  Family states her wheezing is getting worse again.

## 2014-01-12 NOTE — ED Notes (Signed)
Wheezing improved. 

## 2014-01-12 NOTE — ED Provider Notes (Signed)
CSN: [id number]     Arrival date & time 01/12/14  1651 History   First MD Initiated Contact with Patient 01/12/14 1714   This chart was scribed for Babette Relic, MD by Terressa Koyanagi, ED Scribe and Jenne Campus, ED Scribe. This patient was seen in room APA16A/APA16A and the patient's care was started at 5:21 PM.   Chief Complaint  Patient presents with  . Shortness of Breath    Level 5 Caveat-Dementia  The history is provided by a relative and the nursing home. No language interpreter was used.   HPI Comments: [name] [name] is a 80 y.o. female with h/o dementia, DM, HTN, HLD and ASCVD who presents to the Emergency Department complaining of possibly worsened wheezing. Pts family provides the history of current condition. Pt has had intermittent wheezing for the past few years with chronic wheezing for the past few months but has been stable.  Pt has been to the ED multiple times, the most recent visit being earlier today with Dr. Regenia Skeeter.  Pt was discharged by Dr. Regenia Skeeter, however, the nursing home where Pt resides returned her to the ED because she appeared "pale." Pts has been seen by pulmonologist and ENT with CT of neck, CXR, and endoscopy, all of which were normal. Pt has an echocardiogram scheduled 01/21/14 and CT of chest next week for further f/u. Pts family denies any diarrhea, fever or change in mental status currently.     Past Medical History  Diagnosis Date  . ASCVD (arteriosclerotic cardiovascular disease) 2003    Single vessel disease-60% D1  . Hypertension   . Hyperlipidemia   . Diabetes mellitus     No insulin  . Leukopenia     and anemia-hematology evaluation was nodiagnostic  . Degenerative joint disease     right TKA in 4/06  . Upper GI bleed     gastric stress ulcer in 2003  . Retinal artery branch occlusion of right eye     Sudden onset of right eye blindness-embolism suspected  . Gastroesophageal reflux disease with hiatal hernia   . Osteoporosis   .  Lung nodule     Left lower lobe  . Parkinson's disease   . Dementia   . Cervical spondylarthritis     S/p discectomy  . Alzheimer disease   . Diverticulosis   . Hiatal hernia   . Nephrolithiasis   . Leukocytosis   . Hyperlipidemia   . Degenerative joint disease   . GI bleed   . Senile dementia   . Cervical spondylarthritis   . Alzheimer's disease   . Diverticulosis    Past Surgical History  Procedure Laterality Date  . Total abdominal hysterectomy    . Cervical discectomy      Dr. Sherwood Gambler  . Cholecystectomy    . Total knee arthroplasty  2006    Right; Dr. Percell Miller  . Colonoscopy  2009  . Joint replacement    . Vascular surgery    . Cystoscopy w/ ureteral stent placement  09/23/2011    Procedure: CYSTOSCOPY WITH RETROGRADE PYELOGRAM/URETERAL STENT PLACEMENT;  Surgeon: Marissa Nestle;  Location: [facility] ORS;  Service: Urology;  Laterality: Right;  . Esophagogastroduodenoscopy N/A 10/31/2013    Procedure: ESOPHAGOGASTRODUODENOSCOPY (EGD);  Surgeon: Rogene [place], MD;  Location: [facility] ENDO SUITE;  Service: Endoscopy;  Laterality: N/A;   Family History  Problem Relation Age of Onset  . Diabetes Sister   . Heart disease Sister   . Diabetes Brother  History  Substance Use Topics  . Smoking status: Never Smoker   . Smokeless tobacco: Never Used  . Alcohol Use: No   No OB history provided.  Review of Systems  Unable to perform ROS: Dementia    Allergies  Codeine and Morphine  Home Medications   Current Outpatient Rx  Name  Route  Sig  Dispense  Refill  . acetaminophen (TYLENOL) 500 MG tablet   Oral   Take 500 mg by mouth 2 (two) times daily.         Marland Kitchen albuterol (PROVENTIL) (2.5 MG/3ML) 0.083% nebulizer solution   Nebulization   Take 3 mLs (2.5 mg total) by nebulization 4 (four) times daily. And additional as needed   360 mL   6   . aspirin EC 81 MG tablet   Oral   Take 81 mg by mouth daily.         . budesonide (PULMICORT) 0.25 MG/2ML nebulizer  solution   Nebulization   Take 2 mLs (0.25 mg total) by nebulization 2 (two) times daily.   120 mL   6   . citalopram (CELEXA) 20 MG tablet   Oral   Take 20 mg by mouth daily.         . Difluprednate (DUREZOL) 0.05 % EMUL   Right Eye   Place 1 drop into the right eye at bedtime.          . donepezil (ARICEPT) 5 MG tablet   Oral   Take 5 mg by mouth every morning.         . hydroxypropyl methylcellulose (ISOPTO TEARS) 2.5 % ophthalmic solution   Both Eyes   Place 1 drop into both eyes 3 (three) times daily.         Marland Kitchen ipratropium (ATROVENT) 0.02 % nebulizer solution   Nebulization   Take 2.5 mLs (0.5 mg total) by nebulization 4 (four) times daily.   300 mL   6   . loratadine (CLARITIN) 10 MG tablet   Oral   Take 10 mg by mouth daily.         . memantine (NAMENDA) 10 MG tablet   Oral   Take 10 mg by mouth 2 (two) times daily.         Marland Kitchen olmesartan (BENICAR) 20 MG tablet   Oral   Take 1 tablet (20 mg total) by mouth daily.   30 tablet   6   . omeprazole (PRILOSEC) 40 MG capsule   Oral   Take 40 mg by mouth 2 (two) times daily.         . polyethylene glycol (MIRALAX / GLYCOLAX) packet   Oral   Take 17 g by mouth daily. *Mixed with 8 ounces of water/juice daily*         . primidone (MYSOLINE) 50 MG tablet   Oral   Take 0.5 tablets (25 mg total) by mouth at bedtime.   15 tablet   5   . sucralfate (CARAFATE) 1 G tablet   Oral   Take 2 tablets (2 g total) by mouth daily at 10 pm. *to be crushed in small amount of liquid*   60 tablet   2   . Vitamin B Complex-C CAPS   Oral   Take 1 capsule by mouth daily.   30 each   12   . vitamin E 400 UNIT capsule   Oral   Take 400 Units by mouth daily.  BP 132/86  Pulse 101  Temp(Src) 98.5 F (36.9 C) (Oral)  Resp 19  SpO2 99% Physical Exam  Nursing note and vitals reviewed. Constitutional:  Awake, alert, nontoxic appearance.  HENT:  Head: Atraumatic.  Oropharynx is not injected.  No exudate.  Audible wheezing heard from mouth but not heard from lungs with stethoscope.   Eyes: Right eye exhibits no discharge. Left eye exhibits no discharge.  Neck: Neck supple.  Cardiovascular: Normal rate and regular rhythm.   Murmur heard. Lungs clear. No wheezxing, no rales, no retractions and no accessory muscle usage  Pulmonary/Chest: Effort normal. She has wheezes (Upper airway wheezing sounds ). She exhibits no tenderness.  Upper airway wheezing sounds   Abdominal: Soft. Bowel sounds are normal. There is no tenderness. There is no rebound.  Musculoskeletal: She exhibits no tenderness.  Baseline ROM, no obvious new focal weakness.  Neurological:  Mental status and motor strength appears baseline for patient and situation.  Skin: No rash noted.  Psychiatric: She has a normal mood and affect.    ED Course  Procedures (including critical care time)  Labs Review DIAGNOSTIC STUDIES: Oxygen Saturation is 99% on room air, normal by my interpretation.    COORDINATION OF CARE: 5:33 PM-Discussed treatment plan which includes not repeating scans already completed with pt's family at bedside and pt's family agreed to plan. Will discharge to [facility] RN reports Pt still had chronic stable wheezing when returned to SNF so Pt sent back to ED; SNF aware doubt EMC given chronic stable wheezing for months unlikely to resolve this ED visit. Pt stable in ED with no significant deterioration in condition.Patient / Family / Caregiver informed of clinical course, understand medical decision-making process, and agree with plan.Family had wondered why Pt sent back to ED by SNF and family agrees with disposition.  Labs Reviewed - No data to display Imaging Review No results found.  EKG Interpretation   None       MDM   Final diagnoses:  Wheezing  Dementia  I doubt any other EMC precluding discharge at this time including, but not necessarily limited to the  following:SBI.    I personally performed the services described in this documentation, which was scribed in my presence. The recorded information has been reviewed and is accurate.     Babette Relic, MD 01/14/14 [phone]

## 2014-01-12 NOTE — Discharge Instructions (Signed)
°  RETURN IMMEDIATELY IF you develop new shortness of breath, new altered mental status, a new rash, become dizzy, faint, or poorly responsive, or are unable to be cared for at [facility].

## 2014-01-12 NOTE — ED Notes (Signed)
Spoke with er doctor about pt.  Pt is in no visible distress .  o2 sats 93-94 percent.  Ok to d/c pt.

## 2014-01-12 NOTE — ED Provider Notes (Signed)
CSN: [id number]     Arrival date & time 01/12/14  1056 History  This chart was scribed for Ephraim Hamburger, MD by Roxan Diesel, ED scribe.  This patient was seen in room APA15/APA15 and the patient's care was started at 11:44 AM.   Chief Complaint  Patient presents with  . Shortness of Breath    The history is provided by a relative. No language interpreter was used.    Level 5 Caveat: Dementia  HPI Comments: [name] [name] is a 80 y.o. female with h/o dementia, DM, HTN, HLD, and ASCVD who presents to the Emergency Department complaining of worsened wheezing that began 9 hours ago.  Pt is a resident of [facility] and family provide the history.  They state pt has had persistent issues with wheezing for the past several months, with no definite known cause.  Nursing home staff informed family that this morning at 2 AM pt developed worsening of her wheezing, which has persisted since then.  Wheezing is worsened by any activity or movement.  Family state pt has also had a cough today.  They deny known fever or leg swelling.. Pt has been seen in the ED multiple times for wheezing but has not received any definite diagnosis.  Family report that the last time she was seen here in January she was referred for a test that family describe as "a light down her throat," which family state was normal.  She was also seen by a pulmonologist 2 days ago who advised family that he was not concerned for aspiration but he was not able to perform many standard tests due to her dementia.  She is scheduled for an echocardiogram in 2 weeks.        Past Medical History  Diagnosis Date  . ASCVD (arteriosclerotic cardiovascular disease) 2003    Single vessel disease-60% D1  . Hypertension   . Hyperlipidemia   . Diabetes mellitus     No insulin  . Leukopenia     and anemia-hematology evaluation was nodiagnostic  . Degenerative joint disease     right TKA in 4/06  . Upper GI bleed     gastric stress  ulcer in 2003  . Retinal artery branch occlusion of right eye     Sudden onset of right eye blindness-embolism suspected  . Gastroesophageal reflux disease with hiatal hernia   . Osteoporosis   . Lung nodule     Left lower lobe  . Parkinson's disease   . Dementia   . Cervical spondylarthritis     S/p discectomy  . Alzheimer disease   . Diverticulosis   . Hiatal hernia   . Nephrolithiasis     Past Surgical History  Procedure Laterality Date  . Total abdominal hysterectomy    . Cervical discectomy      Dr. Sherwood Gambler  . Cholecystectomy    . Total knee arthroplasty  2006    Right; Dr. Percell Miller  . Colonoscopy  2009  . Joint replacement    . Vascular surgery    . Cystoscopy w/ ureteral stent placement  09/23/2011    Procedure: CYSTOSCOPY WITH RETROGRADE PYELOGRAM/URETERAL STENT PLACEMENT;  Surgeon: Marissa Nestle;  Location: [facility] ORS;  Service: Urology;  Laterality: Right;  . Esophagogastroduodenoscopy N/A 10/31/2013    Procedure: ESOPHAGOGASTRODUODENOSCOPY (EGD);  Surgeon: Rogene Houston, MD;  Location: [facility] ENDO SUITE;  Service: Endoscopy;  Laterality: N/A;    Family History  Problem Relation Age of Onset  . Diabetes Sister   .  Heart disease Sister   . Diabetes Brother     History  Substance Use Topics  . Smoking status: Never Smoker   . Smokeless tobacco: Never Used  . Alcohol Use: No    OB History   Grav Para Term Preterm Abortions TAB SAB Ect Mult Living                  Review of Systems  Unable to perform ROS: Dementia      Allergies  Codeine and Morphine  Home Medications   Current Outpatient Rx  Name  Route  Sig  Dispense  Refill  . acetaminophen (TYLENOL) 500 MG tablet   Oral   Take 500 mg by mouth 2 (two) times daily.         Marland Kitchen albuterol (PROVENTIL) (2.5 MG/3ML) 0.083% nebulizer solution   Nebulization   Take 3 mLs (2.5 mg total) by nebulization 4 (four) times daily. And additional as needed   360 mL   6   . ASPIRIN LOW DOSE 81 MG EC  tablet      TAKE 1 TABLET BY MOUTH ONCE DAILY.   30 tablet   3   . budesonide (PULMICORT) 0.25 MG/2ML nebulizer solution   Nebulization   Take 2 mLs (0.25 mg total) by nebulization 2 (two) times daily.   120 mL   6   . citalopram (CELEXA) 20 MG tablet      TAKE 1 TABLET BY MOUTH ONCE DAILY.(FOR DEPRESSION)   30 tablet   5   . clotrimazole-betamethasone (LOTRISONE) cream   Topical   Apply 1 application topically daily as needed.          . Difluprednate (DUREZOL) 0.05 % EMUL   Right Eye   Place 1 drop into the right eye daily.         Marland Kitchen donepezil (ARICEPT) 5 MG tablet   Oral   Take 5 mg by mouth every morning.         Marland Kitchen ipratropium (ATROVENT) 0.02 % nebulizer solution   Nebulization   Take 2.5 mLs (0.5 mg total) by nebulization 4 (four) times daily.   300 mL   6   . loratadine (CLARITIN) 10 MG tablet   Oral   Take 10 mg by mouth daily.         . memantine (NAMENDA) 10 MG tablet   Oral   Take 10 mg by mouth 2 (two) times daily.         . methylcellulose (ARTIFICIAL TEARS) 1 % ophthalmic solution   Both Eyes   Place 1 drop into both eyes 3 (three) times daily.          . mometasone (ELOCON) 0.1 % cream   Topical   Apply 1 application topically 2 (two) times daily as needed (for irritation).         Marland Kitchen olmesartan (BENICAR) 20 MG tablet   Oral   Take 1 tablet (20 mg total) by mouth daily.   30 tablet   6   . omeprazole (PRILOSEC) 40 MG capsule   Oral   Take 40 mg by mouth 2 (two) times daily.         . ondansetron (ZOFRAN-ODT) 4 MG disintegrating tablet      DISSOLVE 1 TABLET IN MOOUTH EVERY 6 HOURS AS NEEDED FOR NAUSEA.   6 tablet   6   . polyethylene glycol (MIRALAX / GLYCOLAX) packet   Oral   Take 17 g by mouth  daily. *Mixed with 8 ounces of water/juice daily*         . primidone (MYSOLINE) 50 MG tablet   Oral   Take 0.5 tablets (25 mg total) by mouth at bedtime.   15 tablet   5   . sucralfate (CARAFATE) 1 G tablet   Oral    Take 2 tablets (2 g total) by mouth daily at 10 pm. *to be crushed in small amount of liquid*   60 tablet   2   . Vitamin B Complex-C CAPS   Oral   Take 1 capsule by mouth daily.   30 each   12   . vitamin E 400 UNIT capsule      TAKE 1 CAPSULE BY MOUTH ONCE DAILY.   30 capsule   5   . predniSONE (DELTASONE) 20 MG tablet      2 tabs po daily x 4 days   8 tablet   0    BP 150/63  Pulse 94  Resp 19  SpO2 98%  Physical Exam  Nursing note and vitals reviewed. Constitutional: She appears well-developed and well-nourished. No distress.  Audible expiratory wheeze  HENT:  Head: Normocephalic and atraumatic.  Eyes: EOM are normal.  Neck: Neck supple. No tracheal deviation present.  Cardiovascular: Normal rate, regular rhythm and normal heart sounds.   No murmur heard. Pulmonary/Chest: Effort normal. No stridor. No respiratory distress. She has wheezes.  Faint expiratory wheezes diffusely  Musculoskeletal: Normal range of motion.  Neurological: She is alert.  Skin: Skin is warm and dry.  Psychiatric: She has a normal mood and affect. Her behavior is normal.    ED Course  Procedures (including critical care time)  DIAGNOSTIC STUDIES: Oxygen Saturation is 98% on [place], normal by my interpretation.    COORDINATION OF CARE: 11:44 AM-Discussed treatment plan which includes breathing treatment, CXR and labs with pt and family at bedside and they agreed to plan.    Labs Review Labs Reviewed  CBC - Abnormal; Notable for the following:    RBC 3.54 (*)    Hemoglobin 11.7 (*)    MCV 102.3 (*)    All other components within normal limits  BASIC METABOLIC PANEL - Abnormal; Notable for the following:    Glucose, Bld 149 (*)    Creatinine, Ser 1.11 (*)    GFR calc non Af Amer 43 (*)    GFR calc Af Amer 50 (*)    All other components within normal limits    Imaging Review Dg Chest Portable 1 View  01/12/2014   CLINICAL DATA:  Wheezing, shortness of breath, congestion   EXAM: PORTABLE CHEST - 1 VIEW  COMPARISON:  12/25/2013  FINDINGS: Low volume chest exam. Normal heart size and vascularity. Minor central bronchitic change. No focal pneumonia, collapse or consolidation. Negative for edema, effusion or pneumothorax. Lower cervical fusion hardware noted. Atherosclerosis of the aorta and degenerative changes of the spine shoulders.  IMPRESSION: Stable exam. Low lung volumes with minor bronchitic change. No superimposed acute process   Electronically Signed   By: Daryll Brod M.D.   On: 01/12/2014 12:28    EKG Interpretation   None       MDM   Final diagnoses:  Wheezing    Patient's sx seem to worsen with anxiety and/or when she concentrates on her breathing, such as with respiratory exam. No signs of PNA. Given her extensive w/u, including what sounds like a bronchoscopy/ENT scope that seems to have ruled out vocal  cord dysfunction. On re-examination after steroids and albuterol neb she appears calmer and has intermittent wheezing. No hypoxia. No signs of increased WOB. It seems she has this wheezing even at rest w/o distress based on talking to family. I feel she is stable for discharge and to continue her outpatient w/u. Discussed she needs to return if she starting having trouble breathing in addition to her chronic airway noises.  I personally performed the services described in this documentation, which was scribed in my presence. The recorded information has been reviewed and is accurate.    Ephraim Hamburger, MD 01/12/14 [id number]

## 2014-01-13 ENCOUNTER — Telehealth: Payer: Self-pay | Admitting: Family Medicine

## 2014-01-13 ENCOUNTER — Telehealth: Payer: Self-pay | Admitting: Internal Medicine

## 2014-01-13 NOTE — Telephone Encounter (Signed)
Per OV note:             High Resolution CT chest without contrast on ILD protocol   i called and made [facility] aware of this. Nothing further needed

## 2014-01-13 NOTE — Telephone Encounter (Signed)
Patient was sent to the ER 2 times yesterday due to wheezing and was told they couldn't do anything more for her. Her granddaughter called today to see if you could give her advice on what she needs to do for Danyia and if you would be able to do anything more for her.

## 2014-01-14 ENCOUNTER — Ambulatory Visit (HOSPITAL_COMMUNITY): Admission: RE | Admit: 2014-01-14 | Payer: Medicare Other | Source: Ambulatory Visit

## 2014-01-14 DIAGNOSIS — R1312 Dysphagia, oropharyngeal phase: Secondary | ICD-10-CM | POA: Diagnosis not present

## 2014-01-14 DIAGNOSIS — R062 Wheezing: Secondary | ICD-10-CM | POA: Diagnosis not present

## 2014-01-14 DIAGNOSIS — G309 Alzheimer's disease, unspecified: Secondary | ICD-10-CM | POA: Diagnosis not present

## 2014-01-14 DIAGNOSIS — F0280 Dementia in other diseases classified elsewhere without behavioral disturbance: Secondary | ICD-10-CM | POA: Diagnosis not present

## 2014-01-14 DIAGNOSIS — I10 Essential (primary) hypertension: Secondary | ICD-10-CM | POA: Diagnosis not present

## 2014-01-14 DIAGNOSIS — E119 Type 2 diabetes mellitus without complications: Secondary | ICD-10-CM | POA: Diagnosis not present

## 2014-01-15 DIAGNOSIS — R062 Wheezing: Secondary | ICD-10-CM | POA: Diagnosis not present

## 2014-01-15 DIAGNOSIS — G309 Alzheimer's disease, unspecified: Secondary | ICD-10-CM | POA: Diagnosis not present

## 2014-01-15 DIAGNOSIS — E119 Type 2 diabetes mellitus without complications: Secondary | ICD-10-CM | POA: Diagnosis not present

## 2014-01-15 DIAGNOSIS — I10 Essential (primary) hypertension: Secondary | ICD-10-CM | POA: Diagnosis not present

## 2014-01-15 DIAGNOSIS — F0280 Dementia in other diseases classified elsewhere without behavioral disturbance: Secondary | ICD-10-CM | POA: Diagnosis not present

## 2014-01-15 DIAGNOSIS — R1312 Dysphagia, oropharyngeal phase: Secondary | ICD-10-CM | POA: Diagnosis not present

## 2014-01-17 ENCOUNTER — Ambulatory Visit (HOSPITAL_COMMUNITY): Payer: Medicare Other

## 2014-01-17 ENCOUNTER — Ambulatory Visit (INDEPENDENT_AMBULATORY_CARE_PROVIDER_SITE_OTHER): Payer: Medicare Other | Admitting: Ophthalmology

## 2014-01-17 ENCOUNTER — Other Ambulatory Visit (HOSPITAL_COMMUNITY): Payer: Medicare Other

## 2014-01-17 ENCOUNTER — Ambulatory Visit (HOSPITAL_COMMUNITY)
Admission: RE | Admit: 2014-01-17 | Discharge: 2014-01-17 | Disposition: A | Discharge status: Home / Self Care | Payer: Medicare Other | Source: Ambulatory Visit | Attending: Internal Medicine | Admitting: Internal Medicine

## 2014-01-17 DIAGNOSIS — I2510 Atherosclerotic heart disease of native coronary artery without angina pectoris: Secondary | ICD-10-CM | POA: Diagnosis not present

## 2014-01-17 DIAGNOSIS — I10 Essential (primary) hypertension: Secondary | ICD-10-CM | POA: Diagnosis not present

## 2014-01-17 DIAGNOSIS — N200 Calculus of kidney: Secondary | ICD-10-CM | POA: Insufficient documentation

## 2014-01-17 DIAGNOSIS — R1312 Dysphagia, oropharyngeal phase: Secondary | ICD-10-CM | POA: Diagnosis not present

## 2014-01-17 DIAGNOSIS — J8410 Pulmonary fibrosis, unspecified: Secondary | ICD-10-CM | POA: Insufficient documentation

## 2014-01-17 DIAGNOSIS — E119 Type 2 diabetes mellitus without complications: Secondary | ICD-10-CM | POA: Diagnosis not present

## 2014-01-17 DIAGNOSIS — J984 Other disorders of lung: Secondary | ICD-10-CM | POA: Diagnosis not present

## 2014-01-17 DIAGNOSIS — R062 Wheezing: Secondary | ICD-10-CM | POA: Diagnosis not present

## 2014-01-17 DIAGNOSIS — F0280 Dementia in other diseases classified elsewhere without behavioral disturbance: Secondary | ICD-10-CM | POA: Diagnosis not present

## 2014-01-17 DIAGNOSIS — G309 Alzheimer's disease, unspecified: Secondary | ICD-10-CM | POA: Diagnosis not present

## 2014-01-17 NOTE — Telephone Encounter (Signed)
Christy notified and verbalized understanding of Dr. Bary Leriche note.

## 2014-01-17 NOTE — Telephone Encounter (Signed)
I reviewed over her notes from the pulmonary specialist as well as ENT and ER notes. I don't believe there is really anything else we can add to what they are already doing. I am more than happy at any time to see [name] [name] back here in the office. I do agree that a CT scan of her chest is a reasonable step. But the granddaughter no it would be fine for [name] [name] to followup here after she has completed all of the evaluation with the pulmonary specialist.

## 2014-01-20 DIAGNOSIS — E119 Type 2 diabetes mellitus without complications: Secondary | ICD-10-CM | POA: Diagnosis not present

## 2014-01-20 DIAGNOSIS — R062 Wheezing: Secondary | ICD-10-CM | POA: Diagnosis not present

## 2014-01-20 DIAGNOSIS — F0280 Dementia in other diseases classified elsewhere without behavioral disturbance: Secondary | ICD-10-CM | POA: Diagnosis not present

## 2014-01-20 DIAGNOSIS — R1312 Dysphagia, oropharyngeal phase: Secondary | ICD-10-CM | POA: Diagnosis not present

## 2014-01-20 DIAGNOSIS — G309 Alzheimer's disease, unspecified: Secondary | ICD-10-CM | POA: Diagnosis not present

## 2014-01-20 DIAGNOSIS — I10 Essential (primary) hypertension: Secondary | ICD-10-CM | POA: Diagnosis not present

## 2014-01-21 ENCOUNTER — Ambulatory Visit (HOSPITAL_COMMUNITY)
Admission: RE | Admit: 2014-01-21 | Discharge: 2014-01-21 | Disposition: A | Discharge status: Home / Self Care | Payer: Medicare Other | Source: Ambulatory Visit | Attending: Internal Medicine | Admitting: Internal Medicine

## 2014-01-21 DIAGNOSIS — I517 Cardiomegaly: Secondary | ICD-10-CM | POA: Insufficient documentation

## 2014-01-21 DIAGNOSIS — I10 Essential (primary) hypertension: Secondary | ICD-10-CM | POA: Diagnosis not present

## 2014-01-21 DIAGNOSIS — G309 Alzheimer's disease, unspecified: Secondary | ICD-10-CM | POA: Diagnosis not present

## 2014-01-21 DIAGNOSIS — R0989 Other specified symptoms and signs involving the circulatory and respiratory systems: Secondary | ICD-10-CM | POA: Insufficient documentation

## 2014-01-21 DIAGNOSIS — I079 Rheumatic tricuspid valve disease, unspecified: Secondary | ICD-10-CM | POA: Diagnosis not present

## 2014-01-21 DIAGNOSIS — R1312 Dysphagia, oropharyngeal phase: Secondary | ICD-10-CM | POA: Diagnosis not present

## 2014-01-21 DIAGNOSIS — R0609 Other forms of dyspnea: Secondary | ICD-10-CM | POA: Diagnosis not present

## 2014-01-21 DIAGNOSIS — I519 Heart disease, unspecified: Secondary | ICD-10-CM | POA: Diagnosis not present

## 2014-01-21 DIAGNOSIS — R062 Wheezing: Secondary | ICD-10-CM | POA: Diagnosis not present

## 2014-01-21 DIAGNOSIS — Z6832 Body mass index (BMI) 32.0-32.9, adult: Secondary | ICD-10-CM | POA: Insufficient documentation

## 2014-01-21 DIAGNOSIS — R0600 Dyspnea, unspecified: Secondary | ICD-10-CM

## 2014-01-21 DIAGNOSIS — F0280 Dementia in other diseases classified elsewhere without behavioral disturbance: Secondary | ICD-10-CM | POA: Diagnosis not present

## 2014-01-21 DIAGNOSIS — E119 Type 2 diabetes mellitus without complications: Secondary | ICD-10-CM | POA: Diagnosis not present

## 2014-01-21 NOTE — Progress Notes (Signed)
  Echocardiogram 2D Echocardiogram has been performed.  [name] [name] 01/21/2014, 1:00 PM

## 2014-01-22 DIAGNOSIS — I10 Essential (primary) hypertension: Secondary | ICD-10-CM | POA: Diagnosis not present

## 2014-01-22 DIAGNOSIS — R062 Wheezing: Secondary | ICD-10-CM | POA: Diagnosis not present

## 2014-01-22 DIAGNOSIS — F0280 Dementia in other diseases classified elsewhere without behavioral disturbance: Secondary | ICD-10-CM | POA: Diagnosis not present

## 2014-01-22 DIAGNOSIS — E119 Type 2 diabetes mellitus without complications: Secondary | ICD-10-CM | POA: Diagnosis not present

## 2014-01-22 DIAGNOSIS — R1312 Dysphagia, oropharyngeal phase: Secondary | ICD-10-CM | POA: Diagnosis not present

## 2014-01-23 DIAGNOSIS — R1312 Dysphagia, oropharyngeal phase: Secondary | ICD-10-CM | POA: Diagnosis not present

## 2014-01-23 DIAGNOSIS — I10 Essential (primary) hypertension: Secondary | ICD-10-CM | POA: Diagnosis not present

## 2014-01-23 DIAGNOSIS — E119 Type 2 diabetes mellitus without complications: Secondary | ICD-10-CM | POA: Diagnosis not present

## 2014-01-23 DIAGNOSIS — F0280 Dementia in other diseases classified elsewhere without behavioral disturbance: Secondary | ICD-10-CM | POA: Diagnosis not present

## 2014-01-23 DIAGNOSIS — R062 Wheezing: Secondary | ICD-10-CM | POA: Diagnosis not present

## 2014-01-23 DIAGNOSIS — G309 Alzheimer's disease, unspecified: Secondary | ICD-10-CM | POA: Diagnosis not present

## 2014-01-24 ENCOUNTER — Encounter: Payer: Self-pay | Admitting: Family Medicine

## 2014-01-24 ENCOUNTER — Ambulatory Visit (INDEPENDENT_AMBULATORY_CARE_PROVIDER_SITE_OTHER): Payer: Medicare Other | Admitting: Family Medicine

## 2014-01-24 VITALS — BP 112/74 | Temp 98.8°F | Ht 61.0 in | Wt 171.0 lb

## 2014-01-24 DIAGNOSIS — G250 Essential tremor: Secondary | ICD-10-CM

## 2014-01-24 DIAGNOSIS — R062 Wheezing: Secondary | ICD-10-CM

## 2014-01-24 DIAGNOSIS — G252 Other specified forms of tremor: Secondary | ICD-10-CM

## 2014-01-24 DIAGNOSIS — M6281 Muscle weakness (generalized): Secondary | ICD-10-CM

## 2014-01-24 DIAGNOSIS — R1310 Dysphagia, unspecified: Secondary | ICD-10-CM | POA: Diagnosis not present

## 2014-01-24 MED ORDER — BUDESONIDE 0.5 MG/2ML IN SUSP: 0.5000 mg | Freq: Two times a day (BID) | RESPIRATORY_TRACT | Status: DC

## 2014-01-24 NOTE — Progress Notes (Addendum)
   Subjective:    Patient ID: [name] [name], female    DOB: 1924-09-01, 80 y.o.   MRN: [id number]  HPIFollow up.   Still Wheezing. Has had to go to the ER because of respiratory distress a couple times the ER really did not do anything specific other than trying to treat her. Overall patient seemingly is doing well. She's eating well. Functioning well in her environment. The albuterol treatments are causing some problems with significant reactive airway albuterol related tremors. Using Pulmicort twice daily. No oral prednisone recently. According to family they did a bedside swallowing and saw no evidence of regurgitation reflux or choking PMH significant health issues including Alzheimer's hypertension   Discuss potassium and lasix. Med was stopped in hospital.    Review of Systems     Objective:   Physical Exam Lungs are clear in the upper lungs I do hear faint wheezing throughout the lower bases not rest for distress no audible wheezing without the stethoscope currently. Neck no masses extremities no edema skin warm dry blood pressure good       Assessment & Plan:  #1 wheezing-apparently this appears to be asthma but patient is in capable of doing pulmonary function tests she's had a echo which did not show CHF she had CT scan which was unremarkable she has had ENT evaluation and upper neck scan which was unremarkable given all of this I do not feel that this patient needs to go through any further testing she is incapable of doing PFT. I would recommend changing Pulmicort she will use 0.5 twice daily should continue the albuterol on a scheduled basis. Currently she has had a rest home which does not have a LPN or RNs available. Therefore it would be difficult for them to assess the patient regarding when necessary medications. At some point in time this patient's level of health problems might exceed where she is staying. 25 minutes was spent with the family regarding this. I do  believe it is wise for her to followup with pulmonology in a few weeks time in we certainly appreciate their input.  Currently hold off on Lasix. If swelling family will let us know.  She will followup here in 2-3 months.  Patient was seen on 01/25/2014 face to face. The visit was for the reason of asthma wheezing respiratory distress and multiple visits to specialists and ER I certainly certify without any reservation as backed by my board certification from [facility] and the great state [place] that this patient would benefit from home health evaluation and that due to her severe Alzheimer's and weakness she is facility bound at the rest home she is in. Thank you

## 2014-01-25 DIAGNOSIS — F0280 Dementia in other diseases classified elsewhere without behavioral disturbance: Secondary | ICD-10-CM | POA: Diagnosis not present

## 2014-01-25 DIAGNOSIS — E119 Type 2 diabetes mellitus without complications: Secondary | ICD-10-CM | POA: Diagnosis not present

## 2014-01-25 DIAGNOSIS — R1312 Dysphagia, oropharyngeal phase: Secondary | ICD-10-CM | POA: Diagnosis not present

## 2014-01-25 DIAGNOSIS — G309 Alzheimer's disease, unspecified: Secondary | ICD-10-CM | POA: Diagnosis not present

## 2014-01-25 DIAGNOSIS — R062 Wheezing: Secondary | ICD-10-CM | POA: Diagnosis not present

## 2014-01-25 DIAGNOSIS — I10 Essential (primary) hypertension: Secondary | ICD-10-CM | POA: Diagnosis not present

## 2014-01-27 ENCOUNTER — Other Ambulatory Visit: Payer: Self-pay

## 2014-01-27 DIAGNOSIS — E119 Type 2 diabetes mellitus without complications: Secondary | ICD-10-CM | POA: Diagnosis not present

## 2014-01-27 DIAGNOSIS — I10 Essential (primary) hypertension: Secondary | ICD-10-CM | POA: Diagnosis not present

## 2014-01-27 DIAGNOSIS — F0280 Dementia in other diseases classified elsewhere without behavioral disturbance: Secondary | ICD-10-CM | POA: Diagnosis not present

## 2014-01-27 DIAGNOSIS — R1312 Dysphagia, oropharyngeal phase: Secondary | ICD-10-CM | POA: Diagnosis not present

## 2014-01-27 DIAGNOSIS — G309 Alzheimer's disease, unspecified: Secondary | ICD-10-CM | POA: Diagnosis not present

## 2014-01-27 DIAGNOSIS — R062 Wheezing: Secondary | ICD-10-CM | POA: Diagnosis not present

## 2014-01-27 MED ORDER — SULFACETAMIDE SODIUM 10 % OP SOLN: 2.0000 [drp] | Freq: Four times a day (QID) | OPHTHALMIC | Status: DC

## 2014-01-28 ENCOUNTER — Other Ambulatory Visit: Payer: Self-pay | Admitting: Family Medicine

## 2014-01-28 ENCOUNTER — Telehealth: Payer: Self-pay | Admitting: Family Medicine

## 2014-01-28 DIAGNOSIS — F0280 Dementia in other diseases classified elsewhere without behavioral disturbance: Secondary | ICD-10-CM | POA: Diagnosis not present

## 2014-01-28 DIAGNOSIS — R062 Wheezing: Secondary | ICD-10-CM | POA: Diagnosis not present

## 2014-01-28 DIAGNOSIS — I10 Essential (primary) hypertension: Secondary | ICD-10-CM | POA: Diagnosis not present

## 2014-01-28 DIAGNOSIS — G309 Alzheimer's disease, unspecified: Secondary | ICD-10-CM | POA: Diagnosis not present

## 2014-01-28 DIAGNOSIS — H01009 Unspecified blepharitis unspecified eye, unspecified eyelid: Secondary | ICD-10-CM | POA: Diagnosis not present

## 2014-01-28 DIAGNOSIS — E119 Type 2 diabetes mellitus without complications: Secondary | ICD-10-CM | POA: Diagnosis not present

## 2014-01-28 DIAGNOSIS — R1312 Dysphagia, oropharyngeal phase: Secondary | ICD-10-CM | POA: Diagnosis not present

## 2014-01-28 NOTE — Telephone Encounter (Signed)
Patient went to the eye doctor yesterday for her eye and found out that it a bacterial issue and that the eye drops you prescribed would not help. He gave her a cream to put on her eye, but [name] [name] needs to know if you want to discontinue the eye drops? If so they need a signed order faxed over.

## 2014-01-28 NOTE — Telephone Encounter (Signed)
D/c eye drops, fax order

## 2014-01-28 NOTE — Telephone Encounter (Signed)
Order faxed to [facility] to d/c sulfacetamide eye drops

## 2014-01-29 ENCOUNTER — Telehealth: Payer: Self-pay | Admitting: Family Medicine

## 2014-01-29 DIAGNOSIS — F0280 Dementia in other diseases classified elsewhere without behavioral disturbance: Secondary | ICD-10-CM | POA: Diagnosis not present

## 2014-01-29 DIAGNOSIS — E119 Type 2 diabetes mellitus without complications: Secondary | ICD-10-CM | POA: Diagnosis not present

## 2014-01-29 DIAGNOSIS — I10 Essential (primary) hypertension: Secondary | ICD-10-CM | POA: Diagnosis not present

## 2014-01-29 DIAGNOSIS — G309 Alzheimer's disease, unspecified: Secondary | ICD-10-CM | POA: Diagnosis not present

## 2014-01-29 DIAGNOSIS — R1312 Dysphagia, oropharyngeal phase: Secondary | ICD-10-CM | POA: Diagnosis not present

## 2014-01-29 DIAGNOSIS — R062 Wheezing: Secondary | ICD-10-CM | POA: Diagnosis not present

## 2014-01-29 NOTE — Telephone Encounter (Signed)
Order was faxed again.

## 2014-01-29 NOTE — Telephone Encounter (Signed)
Patients daughter called today and said that [name] [name] called her last night telling her that they needed an order from Dr Nicki Reaper saying to discontinue the eye drops that patient was prescribed. I explained that the message on EPIC said that it was faxed, but I would get the nurse to resend it.

## 2014-01-30 DIAGNOSIS — R062 Wheezing: Secondary | ICD-10-CM | POA: Diagnosis not present

## 2014-01-30 DIAGNOSIS — F0280 Dementia in other diseases classified elsewhere without behavioral disturbance: Secondary | ICD-10-CM | POA: Diagnosis not present

## 2014-01-30 DIAGNOSIS — I10 Essential (primary) hypertension: Secondary | ICD-10-CM | POA: Diagnosis not present

## 2014-01-30 DIAGNOSIS — R1312 Dysphagia, oropharyngeal phase: Secondary | ICD-10-CM | POA: Diagnosis not present

## 2014-01-30 DIAGNOSIS — E119 Type 2 diabetes mellitus without complications: Secondary | ICD-10-CM | POA: Diagnosis not present

## 2014-01-31 ENCOUNTER — Other Ambulatory Visit: Payer: Self-pay | Admitting: Family Medicine

## 2014-01-31 DIAGNOSIS — G309 Alzheimer's disease, unspecified: Secondary | ICD-10-CM | POA: Diagnosis not present

## 2014-01-31 DIAGNOSIS — I10 Essential (primary) hypertension: Secondary | ICD-10-CM | POA: Diagnosis not present

## 2014-01-31 DIAGNOSIS — R1312 Dysphagia, oropharyngeal phase: Secondary | ICD-10-CM | POA: Diagnosis not present

## 2014-01-31 DIAGNOSIS — F0280 Dementia in other diseases classified elsewhere without behavioral disturbance: Secondary | ICD-10-CM | POA: Diagnosis not present

## 2014-01-31 DIAGNOSIS — R062 Wheezing: Secondary | ICD-10-CM | POA: Diagnosis not present

## 2014-01-31 DIAGNOSIS — E119 Type 2 diabetes mellitus without complications: Secondary | ICD-10-CM | POA: Diagnosis not present

## 2014-02-03 DIAGNOSIS — R062 Wheezing: Secondary | ICD-10-CM | POA: Diagnosis not present

## 2014-02-03 DIAGNOSIS — R1312 Dysphagia, oropharyngeal phase: Secondary | ICD-10-CM | POA: Diagnosis not present

## 2014-02-03 DIAGNOSIS — F0280 Dementia in other diseases classified elsewhere without behavioral disturbance: Secondary | ICD-10-CM | POA: Diagnosis not present

## 2014-02-03 DIAGNOSIS — I10 Essential (primary) hypertension: Secondary | ICD-10-CM | POA: Diagnosis not present

## 2014-02-03 DIAGNOSIS — E119 Type 2 diabetes mellitus without complications: Secondary | ICD-10-CM | POA: Diagnosis not present

## 2014-02-04 ENCOUNTER — Other Ambulatory Visit: Payer: Self-pay | Admitting: Family Medicine

## 2014-02-05 DIAGNOSIS — E119 Type 2 diabetes mellitus without complications: Secondary | ICD-10-CM | POA: Diagnosis not present

## 2014-02-05 DIAGNOSIS — R062 Wheezing: Secondary | ICD-10-CM | POA: Diagnosis not present

## 2014-02-05 DIAGNOSIS — G309 Alzheimer's disease, unspecified: Secondary | ICD-10-CM | POA: Diagnosis not present

## 2014-02-05 DIAGNOSIS — F0280 Dementia in other diseases classified elsewhere without behavioral disturbance: Secondary | ICD-10-CM | POA: Diagnosis not present

## 2014-02-05 DIAGNOSIS — I10 Essential (primary) hypertension: Secondary | ICD-10-CM | POA: Diagnosis not present

## 2014-02-05 DIAGNOSIS — R1312 Dysphagia, oropharyngeal phase: Secondary | ICD-10-CM | POA: Diagnosis not present

## 2014-02-06 ENCOUNTER — Other Ambulatory Visit: Payer: Self-pay | Admitting: *Deleted

## 2014-02-06 DIAGNOSIS — R062 Wheezing: Secondary | ICD-10-CM | POA: Diagnosis not present

## 2014-02-06 DIAGNOSIS — F0280 Dementia in other diseases classified elsewhere without behavioral disturbance: Secondary | ICD-10-CM | POA: Diagnosis not present

## 2014-02-06 DIAGNOSIS — R1312 Dysphagia, oropharyngeal phase: Secondary | ICD-10-CM | POA: Diagnosis not present

## 2014-02-06 DIAGNOSIS — I10 Essential (primary) hypertension: Secondary | ICD-10-CM | POA: Diagnosis not present

## 2014-02-06 DIAGNOSIS — E119 Type 2 diabetes mellitus without complications: Secondary | ICD-10-CM | POA: Diagnosis not present

## 2014-02-06 MED ORDER — SULFACETAMIDE SODIUM 10 % OP SOLN: 2.0000 [drp] | Freq: Four times a day (QID) | OPHTHALMIC | Status: DC

## 2014-02-07 DIAGNOSIS — R1312 Dysphagia, oropharyngeal phase: Secondary | ICD-10-CM | POA: Diagnosis not present

## 2014-02-07 DIAGNOSIS — I10 Essential (primary) hypertension: Secondary | ICD-10-CM | POA: Diagnosis not present

## 2014-02-07 DIAGNOSIS — G309 Alzheimer's disease, unspecified: Secondary | ICD-10-CM | POA: Diagnosis not present

## 2014-02-07 DIAGNOSIS — E119 Type 2 diabetes mellitus without complications: Secondary | ICD-10-CM | POA: Diagnosis not present

## 2014-02-07 DIAGNOSIS — R062 Wheezing: Secondary | ICD-10-CM | POA: Diagnosis not present

## 2014-02-07 DIAGNOSIS — F0280 Dementia in other diseases classified elsewhere without behavioral disturbance: Secondary | ICD-10-CM | POA: Diagnosis not present

## 2014-02-08 ENCOUNTER — Telehealth: Payer: Self-pay | Admitting: Internal Medicine

## 2014-02-08 NOTE — Telephone Encounter (Signed)
Triage (cc to [facility]), Sending to triage to expedite process     Pleae call patient Monday 02/10/14 AM and inform her that blood tests shows POSITIVE FOR REHUMATOID ARTHRITIS and can explain a) a LOT Of her pain; b) and part of her pulmonary hypertension/shortness of breath. So, there are medication options for both.   PLAN - She needs to see dr Lake Bells ASAP next few days to  discuss pulm htn rx options; give her appt and also get her preop clearance  - she needs to see sleep doc at some point but if cost is an issue can d/w McQuaid about timing  - she absolutely needs to see rheumatology [facility]) to evaluate for RA and potential Rx; if she wants to hold off on this can d/w mcQuaid at fu   Dr. Brand Males, M.D., [facility].C.P Pulmonary and Critical Care Medicine Staff Physician [facility] Pager: [phone], If no answer or between  15:00h - 7:00h: call [phone]  02/08/2014 5:44 PM

## 2014-02-10 NOTE — Telephone Encounter (Signed)
Called home # listed for pt and was advised she was not a resident there Called alternate # and LMTCB x1

## 2014-02-11 DIAGNOSIS — G309 Alzheimer's disease, unspecified: Secondary | ICD-10-CM | POA: Diagnosis not present

## 2014-02-11 DIAGNOSIS — I10 Essential (primary) hypertension: Secondary | ICD-10-CM | POA: Diagnosis not present

## 2014-02-11 DIAGNOSIS — R062 Wheezing: Secondary | ICD-10-CM | POA: Diagnosis not present

## 2014-02-11 DIAGNOSIS — R1312 Dysphagia, oropharyngeal phase: Secondary | ICD-10-CM | POA: Diagnosis not present

## 2014-02-11 DIAGNOSIS — F0280 Dementia in other diseases classified elsewhere without behavioral disturbance: Secondary | ICD-10-CM | POA: Diagnosis not present

## 2014-02-11 DIAGNOSIS — E119 Type 2 diabetes mellitus without complications: Secondary | ICD-10-CM | POA: Diagnosis not present

## 2014-02-11 NOTE — Telephone Encounter (Signed)
Spoke with pt's grandaughter and advised of lab results per MR.  Appt made with Dr Lake Bells on 02/13/14.  They will discuss sleep appt and rheumatology appt with Dr Lake Bells.

## 2014-02-12 DIAGNOSIS — I10 Essential (primary) hypertension: Secondary | ICD-10-CM | POA: Diagnosis not present

## 2014-02-12 DIAGNOSIS — E119 Type 2 diabetes mellitus without complications: Secondary | ICD-10-CM | POA: Diagnosis not present

## 2014-02-12 DIAGNOSIS — F0280 Dementia in other diseases classified elsewhere without behavioral disturbance: Secondary | ICD-10-CM | POA: Diagnosis not present

## 2014-02-12 DIAGNOSIS — R1312 Dysphagia, oropharyngeal phase: Secondary | ICD-10-CM | POA: Diagnosis not present

## 2014-02-12 DIAGNOSIS — G309 Alzheimer's disease, unspecified: Secondary | ICD-10-CM | POA: Diagnosis not present

## 2014-02-12 DIAGNOSIS — R062 Wheezing: Secondary | ICD-10-CM | POA: Diagnosis not present

## 2014-02-13 ENCOUNTER — Ambulatory Visit (INDEPENDENT_AMBULATORY_CARE_PROVIDER_SITE_OTHER): Payer: Medicare Other | Admitting: Pulmonary Disease

## 2014-02-13 ENCOUNTER — Encounter: Payer: Self-pay | Admitting: Pulmonary Disease

## 2014-02-13 VITALS — BP 124/76 | HR 89 | Ht 61.0 in | Wt 170.0 lb

## 2014-02-13 DIAGNOSIS — R062 Wheezing: Secondary | ICD-10-CM | POA: Diagnosis not present

## 2014-02-13 DIAGNOSIS — I2789 Other specified pulmonary heart diseases: Secondary | ICD-10-CM | POA: Diagnosis not present

## 2014-02-13 DIAGNOSIS — I272 Pulmonary hypertension, unspecified: Secondary | ICD-10-CM

## 2014-02-13 NOTE — Progress Notes (Signed)
Subjective:    Patient ID: [name] [name], female    DOB: [birth year], 80 y.o.   MRN: [id number]  HPI  80 y/o female sent to me by a partner to evaluate for pulmonary hypertension.  She was previously seen by Dr. Purnell Shoemaker for wheezing.  She has advance dementia and lives in a nursing home.  Her family notes that she gets wheezing throughout the day and will occasionally appear to be in respiratory distress.  She has been using albuterol and pulmicort nebs lately but these make her wheeze more.  She is not very mobile to speak of.  Past Medical History  Diagnosis Date  . ASCVD (arteriosclerotic cardiovascular disease) 2003    Single vessel disease-60% D1  . Hypertension   . Hyperlipidemia   . Diabetes mellitus     No insulin  . Leukopenia     and anemia-hematology evaluation was nodiagnostic  . Degenerative joint disease     right TKA in 4/06  . Upper GI bleed     gastric stress ulcer in 2003  . Retinal artery branch occlusion of right eye     Sudden onset of right eye blindness-embolism suspected  . Gastroesophageal reflux disease with hiatal hernia   . Osteoporosis   . Lung nodule     Left lower lobe  . Parkinson's disease   . Dementia   . Cervical spondylarthritis     S/p discectomy  . Alzheimer disease   . Diverticulosis   . Hiatal hernia   . Nephrolithiasis   . Leukocytosis   . Hyperlipidemia   . Degenerative joint disease   . GI bleed   . Senile dementia   . Cervical spondylarthritis   . Alzheimer's disease   . Diverticulosis      Family History  Problem Relation Age of Onset  . Diabetes Sister   . Heart disease Sister   . Diabetes Brother      History   Social History  . Marital Status: Widowed    Spouse Name: N/A    Number of Children: N/A  . Years of Education: N/A   Occupational History  . Not on file.   Social History Main Topics  . Smoking status: Never Smoker   . Smokeless tobacco: Never Used  . Alcohol Use: No  . Drug Use: No  .  Sexual Activity: No   Other Topics Concern  . Not on file   Social History Narrative   Retired from [employer]  . Codeine Other (See Comments)    REACTION: Unknown  . Morphine Nausea And Vomiting     Outpatient Prescriptions Prior to Visit  Medication Sig Dispense Refill  . acetaminophen (TYLENOL) 500 MG tablet Take 500 mg by mouth 2 (two) times daily.      Marland Kitchen albuterol (PROVENTIL) (2.5 MG/3ML) 0.083% nebulizer solution Take 3 mLs (2.5 mg total) by nebulization 4 (four) times daily. And additional as needed  360 mL  6  . aspirin EC 81 MG tablet Take 81 mg by mouth daily.      . budesonide (PULMICORT) 0.5 MG/2ML nebulizer solution Take 2 mLs (0.5 mg total) by nebulization 2 (two) times daily.  120 mL  12  . citalopram (CELEXA) 20 MG tablet Take 20 mg by mouth daily.      . Difluprednate (DUREZOL) 0.05 % EMUL Place 1 drop into the right eye at bedtime.       Marland Kitchen  donepezil (ARICEPT) 5 MG tablet Take 5 mg by mouth every morning.      . hydroxypropyl methylcellulose (ISOPTO TEARS) 2.5 % ophthalmic solution Place 1 drop into both eyes 3 (three) times daily.      . Hypromellose (ARTIFICIAL TEARS OP) Apply to eye. One drop each eye tid      . ipratropium (ATROVENT) 0.02 % nebulizer solution Take 2.5 mLs (0.5 mg total) by nebulization 4 (four) times daily.  300 mL  6  . loratadine (CLARITIN) 10 MG tablet TAKE 1 TABLET BY MOUTH ONCE DAILY.(FOR ALLERGIC RHINITIS)  30 tablet  5  . NAMENDA 10 MG tablet TAKE 1 TABLET BY MOUTH TWICE DAILY.  60 tablet  5  . olmesartan (BENICAR) 20 MG tablet Take 1 tablet (20 mg total) by mouth daily.  30 tablet  6  . omeprazole (PRILOSEC) 40 MG capsule TAKE 1 CAPSULE BY MOUTH TWICE DAILY FOR ACID REFLUX.  60 capsule  2  . polyethylene glycol (MIRALAX / GLYCOLAX) packet Take 17 g by mouth daily. *Mixed with 8 ounces of water/juice daily*      . primidone (MYSOLINE) 50 MG tablet TAKE 1/2 TABLET (25mg ) BY MOUTH AT BEDTIME.  15  tablet  4  . sucralfate (CARAFATE) 1 G tablet Take 2 tablets (2 g total) by mouth daily at 10 pm. *to be crushed in small amount of liquid*  60 tablet  2  . sulfacetamide (BLEPH-10) 10 % ophthalmic solution Place 2 drops into the right eye 4 (four) times daily. For 5 days  15 mL  0  . Vitamin B Complex-C CAPS Take 1 capsule by mouth daily.  30 each  12  . vitamin E 400 UNIT capsule Take 400 Units by mouth daily.       No facility-administered medications prior to visit.      Review of Systems  Constitutional: Negative for fever and unexpected weight change.  HENT: Negative for congestion, dental problem, ear pain, nosebleeds, postnasal drip, rhinorrhea, sinus pressure, sneezing, sore throat and trouble swallowing.   Eyes: Negative for redness and itching.  Respiratory: Positive for shortness of breath and wheezing. Negative for cough and chest tightness.   Cardiovascular: Negative for palpitations and leg swelling.  Gastrointestinal: Negative for nausea and vomiting.  Genitourinary: Negative for dysuria.  Musculoskeletal: Negative for joint swelling.  Skin: Negative for rash.  Neurological: Negative for headaches.  Hematological: Does not bruise/bleed easily.  Psychiatric/Behavioral: Negative for dysphoric mood. The patient is not nervous/anxious.        Objective:   Physical Exam Filed Vitals:   02/13/14 1525  BP: 124/76  Pulse: 89  Height: 5\' 1"  (1.549 m)  Weight: 77.111 kg (170 lb)  SpO2: 94%   Gen: chronically ill appearing, in wheelchair HEENT: NCAT, EOMi, OP clear, neck supple without masses PULM: Occasional upper airway only wheeze that comes and goes throughout visit, it was not noted for the majority of the visit, no respiratory distress with wheeze CV: RRR, no mgr, no JVD AB: BS+, soft, nontender, no hsm Ext: warm, trace leg edema, no clubbing, no cyanosis Derm: no rash or skin breakdown Neuro: awake, occasionally speaks, not oriented, not conversant         Assessment & Plan:  1) Pulmonary hypertension > mild elevation by echocardiogram.  I am not too impressed that this is PAH.  Explained to family at length that this could be due to some degree of sleep apnea.  They are not interested in a sleep  study. -no indication for treatment  2) Wheezing> extensive work up negative (CT neck, CT chest); given variable nature and upper airway wheeze this seems most consistent with vocal cord dysfunction.  Albuterol likely makes her more anxious thereby increasing wheeze.  Advised family at length to try to calm her down during episodes while still monitoring vital signs as needed for respiratory distress. -d/c nebulized therapies  3) ? Rheumatoid arthritis> this was a lab reporting error.  She does not have rheumatoid arthritis.  Clarified with family.  F/u with pulmonary prn  Jillyn Hidden PCCM Pager: [phone] Cell: [phone] If no response, call [phone]

## 2014-02-13 NOTE — Patient Instructions (Signed)
You do not have rheumatoid arthritis You do not have significant pulmonary hypertension Stop taking albuterol, combivent, and pulmicort When she gets more wheezing, try to help her take slow deep breaths and calm down; check her vital signs, if they are abnormal then she needs further evaluation, otherwise you just need to try to help calm her down We will see you back on an as needed basis

## 2014-02-14 ENCOUNTER — Encounter: Payer: Self-pay | Admitting: Family Medicine

## 2014-02-14 ENCOUNTER — Ambulatory Visit: Payer: Medicare Other | Admitting: Internal Medicine

## 2014-02-14 ENCOUNTER — Ambulatory Visit (INDEPENDENT_AMBULATORY_CARE_PROVIDER_SITE_OTHER): Payer: Medicare Other | Admitting: Family Medicine

## 2014-02-14 VITALS — Ht 61.0 in | Wt 171.0 lb

## 2014-02-14 DIAGNOSIS — R0989 Other specified symptoms and signs involving the circulatory and respiratory systems: Secondary | ICD-10-CM | POA: Diagnosis not present

## 2014-02-14 DIAGNOSIS — R1312 Dysphagia, oropharyngeal phase: Secondary | ICD-10-CM | POA: Diagnosis not present

## 2014-02-14 DIAGNOSIS — I10 Essential (primary) hypertension: Secondary | ICD-10-CM | POA: Diagnosis not present

## 2014-02-14 DIAGNOSIS — E119 Type 2 diabetes mellitus without complications: Secondary | ICD-10-CM | POA: Diagnosis not present

## 2014-02-14 DIAGNOSIS — R0689 Other abnormalities of breathing: Secondary | ICD-10-CM

## 2014-02-14 DIAGNOSIS — F0280 Dementia in other diseases classified elsewhere without behavioral disturbance: Secondary | ICD-10-CM | POA: Diagnosis not present

## 2014-02-14 DIAGNOSIS — R062 Wheezing: Secondary | ICD-10-CM

## 2014-02-14 DIAGNOSIS — G309 Alzheimer's disease, unspecified: Secondary | ICD-10-CM | POA: Diagnosis not present

## 2014-02-14 NOTE — Progress Notes (Signed)
   Subjective:    Patient ID: [name] [name], female    DOB: 1924/01/21, 80 y.o.   MRN: [id number]  HPI  Patient arrives to re check wheezing. Patient saw pulmonologist yesterday and he reported it was upper respiratory and not in her lungs. Extensive review of notes from the pulmonologist ear nose throat and recent visits. Extensive discussion with family greater than half time spent in discussion with the family. 25 minute visit.  Review of past medical history both from paper chart and electronic chart with the family and review of testing recently done including blood work echo and x-ray. Review of Systems Reportedly at the nursing home has spells of wheezing no vomiting no diarrhea no fevers. Eating well. At times gets a little agitated.    Objective:   Physical Exam  Supraglottic air sounds were noted. Not respiratory distress. No vomiting no wheezing in the small airways. Heart regular. Color was good. Not diaphoretic.      Assessment & Plan:  Supraglottic breath sounds-there is no obvious wheezing here I listened to where her vocal cords are plus also aware that our aspects of her lung is. Both of these equal out. I don't find evidence of asthma going on. I agree with the pulmonologist to hold off on albuterol and steroid inhalers. I would not recommend further testing. If cyanosis diaphoresis or other problems then needs to be reevaluated here or ER followup in one month family agrees with rationale.  Nebulizer treatment was given there was no significant improvement with her function with this treatment.  The nursing home was sent over orders for doing portable chest x-rays I do not feel that this would be of any benefit to the patient and I told family not to get this done.

## 2014-02-17 DIAGNOSIS — R062 Wheezing: Secondary | ICD-10-CM | POA: Diagnosis not present

## 2014-02-17 DIAGNOSIS — R1312 Dysphagia, oropharyngeal phase: Secondary | ICD-10-CM | POA: Diagnosis not present

## 2014-02-17 DIAGNOSIS — G309 Alzheimer's disease, unspecified: Secondary | ICD-10-CM | POA: Diagnosis not present

## 2014-02-17 DIAGNOSIS — F0280 Dementia in other diseases classified elsewhere without behavioral disturbance: Secondary | ICD-10-CM | POA: Diagnosis not present

## 2014-02-17 DIAGNOSIS — E119 Type 2 diabetes mellitus without complications: Secondary | ICD-10-CM | POA: Diagnosis not present

## 2014-02-17 DIAGNOSIS — I10 Essential (primary) hypertension: Secondary | ICD-10-CM | POA: Diagnosis not present

## 2014-02-19 ENCOUNTER — Other Ambulatory Visit: Payer: Self-pay | Admitting: Urology

## 2014-02-19 ENCOUNTER — Ambulatory Visit (HOSPITAL_COMMUNITY)
Admission: RE | Admit: 2014-02-19 | Discharge: 2014-02-19 | Disposition: A | Discharge status: Home / Self Care | Payer: Medicare Other | Source: Ambulatory Visit | Attending: Urology | Admitting: Urology

## 2014-02-19 DIAGNOSIS — N200 Calculus of kidney: Secondary | ICD-10-CM

## 2014-02-19 DIAGNOSIS — N2889 Other specified disorders of kidney and ureter: Secondary | ICD-10-CM | POA: Diagnosis not present

## 2014-02-19 DIAGNOSIS — N289 Disorder of kidney and ureter, unspecified: Secondary | ICD-10-CM | POA: Insufficient documentation

## 2014-02-20 DIAGNOSIS — R062 Wheezing: Secondary | ICD-10-CM | POA: Diagnosis not present

## 2014-02-20 DIAGNOSIS — I10 Essential (primary) hypertension: Secondary | ICD-10-CM | POA: Diagnosis not present

## 2014-02-20 DIAGNOSIS — R1312 Dysphagia, oropharyngeal phase: Secondary | ICD-10-CM | POA: Diagnosis not present

## 2014-02-20 DIAGNOSIS — G309 Alzheimer's disease, unspecified: Secondary | ICD-10-CM | POA: Diagnosis not present

## 2014-02-20 DIAGNOSIS — F0280 Dementia in other diseases classified elsewhere without behavioral disturbance: Secondary | ICD-10-CM | POA: Diagnosis not present

## 2014-02-20 DIAGNOSIS — E119 Type 2 diabetes mellitus without complications: Secondary | ICD-10-CM | POA: Diagnosis not present

## 2014-02-24 DIAGNOSIS — R1312 Dysphagia, oropharyngeal phase: Secondary | ICD-10-CM | POA: Diagnosis not present

## 2014-02-24 DIAGNOSIS — I10 Essential (primary) hypertension: Secondary | ICD-10-CM | POA: Diagnosis not present

## 2014-02-24 DIAGNOSIS — F0280 Dementia in other diseases classified elsewhere without behavioral disturbance: Secondary | ICD-10-CM | POA: Diagnosis not present

## 2014-02-24 DIAGNOSIS — E119 Type 2 diabetes mellitus without complications: Secondary | ICD-10-CM | POA: Diagnosis not present

## 2014-02-24 DIAGNOSIS — R062 Wheezing: Secondary | ICD-10-CM | POA: Diagnosis not present

## 2014-02-27 DIAGNOSIS — E119 Type 2 diabetes mellitus without complications: Secondary | ICD-10-CM | POA: Diagnosis not present

## 2014-02-27 DIAGNOSIS — R062 Wheezing: Secondary | ICD-10-CM | POA: Diagnosis not present

## 2014-02-27 DIAGNOSIS — R1312 Dysphagia, oropharyngeal phase: Secondary | ICD-10-CM | POA: Diagnosis not present

## 2014-02-27 DIAGNOSIS — F0280 Dementia in other diseases classified elsewhere without behavioral disturbance: Secondary | ICD-10-CM | POA: Diagnosis not present

## 2014-02-27 DIAGNOSIS — G309 Alzheimer's disease, unspecified: Secondary | ICD-10-CM | POA: Diagnosis not present

## 2014-02-27 DIAGNOSIS — I10 Essential (primary) hypertension: Secondary | ICD-10-CM | POA: Diagnosis not present

## 2014-02-28 ENCOUNTER — Other Ambulatory Visit: Payer: Self-pay | Admitting: Urology

## 2014-02-28 ENCOUNTER — Ambulatory Visit (INDEPENDENT_AMBULATORY_CARE_PROVIDER_SITE_OTHER): Payer: Medicare Other | Admitting: Urology

## 2014-02-28 DIAGNOSIS — N3942 Incontinence without sensory awareness: Secondary | ICD-10-CM

## 2014-02-28 DIAGNOSIS — N281 Cyst of kidney, acquired: Secondary | ICD-10-CM

## 2014-02-28 DIAGNOSIS — Z87440 Personal history of urinary (tract) infections: Secondary | ICD-10-CM

## 2014-02-28 DIAGNOSIS — N200 Calculus of kidney: Secondary | ICD-10-CM

## 2014-03-04 ENCOUNTER — Ambulatory Visit (HOSPITAL_COMMUNITY)
Admission: RE | Admit: 2014-03-04 | Discharge: 2014-03-04 | Disposition: A | Discharge status: Home / Self Care | Payer: Medicare Other | Source: Ambulatory Visit | Attending: Urology | Admitting: Urology

## 2014-03-04 ENCOUNTER — Telehealth: Payer: Self-pay | Admitting: Family Medicine

## 2014-03-04 DIAGNOSIS — M949 Disorder of cartilage, unspecified: Secondary | ICD-10-CM

## 2014-03-04 DIAGNOSIS — M899 Disorder of bone, unspecified: Secondary | ICD-10-CM | POA: Diagnosis not present

## 2014-03-04 DIAGNOSIS — K449 Diaphragmatic hernia without obstruction or gangrene: Secondary | ICD-10-CM | POA: Diagnosis not present

## 2014-03-04 DIAGNOSIS — N289 Disorder of kidney and ureter, unspecified: Secondary | ICD-10-CM | POA: Diagnosis not present

## 2014-03-04 DIAGNOSIS — N200 Calculus of kidney: Secondary | ICD-10-CM | POA: Insufficient documentation

## 2014-03-04 DIAGNOSIS — E119 Type 2 diabetes mellitus without complications: Secondary | ICD-10-CM | POA: Diagnosis not present

## 2014-03-04 DIAGNOSIS — N201 Calculus of ureter: Secondary | ICD-10-CM | POA: Diagnosis not present

## 2014-03-04 NOTE — Telephone Encounter (Signed)
Daughter said right now she is fine but wanted something on hand in case. Daughter not sure what pain meds she can take-doesn't take them very often but thinks she has had hydrocodone before

## 2014-03-04 NOTE — Telephone Encounter (Signed)
[facility] cant pick doses- has to say one pill or 2 pills. Consult with Dr. Nicki Reaper: Tylenol 500mg  2 pills every 6hrs as needed for pain. No more than 6 in 24 hrs. Med called into [facility]. Family notified.

## 2014-03-04 NOTE — Telephone Encounter (Signed)
Tylenol would be okay 500 mg may take 1 to  2 every4 to  6 hours as needed for pain, no greater than 5 tablets per day. If needing something stronger call us back

## 2014-03-04 NOTE — Telephone Encounter (Signed)
Patient has a kidney stone and has to go to the hospital to have this removed because it is too large to pass. Her urologist, Dr Jeffie Pollock, told her daughter that she needed to call our office and have some pain medication filled for her, because of her pain she is experiencing from this. Please advise.

## 2014-03-04 NOTE — Telephone Encounter (Signed)
Family wonders if increasing tylenol would be just as good as pain med.

## 2014-03-05 DIAGNOSIS — F0280 Dementia in other diseases classified elsewhere without behavioral disturbance: Secondary | ICD-10-CM | POA: Diagnosis not present

## 2014-03-05 DIAGNOSIS — G309 Alzheimer's disease, unspecified: Secondary | ICD-10-CM | POA: Diagnosis not present

## 2014-03-05 DIAGNOSIS — E119 Type 2 diabetes mellitus without complications: Secondary | ICD-10-CM | POA: Diagnosis not present

## 2014-03-05 DIAGNOSIS — I10 Essential (primary) hypertension: Secondary | ICD-10-CM | POA: Diagnosis not present

## 2014-03-05 DIAGNOSIS — R062 Wheezing: Secondary | ICD-10-CM | POA: Diagnosis not present

## 2014-03-05 DIAGNOSIS — R1312 Dysphagia, oropharyngeal phase: Secondary | ICD-10-CM | POA: Diagnosis not present

## 2014-03-06 ENCOUNTER — Telehealth: Payer: Self-pay | Admitting: Family Medicine

## 2014-03-06 NOTE — Telephone Encounter (Signed)
Patients daughter says that Dr Jeffie Pollock sent over a surgical clearance for patient to have a kidney stone removed and she wanted to make sure that we had it.

## 2014-03-06 NOTE — Telephone Encounter (Signed)
Although it is difficult for the patient to get here. Because of her being a higher risk patient she needs to be seen in order to get surgical clearance. We can accommodate them with an appointment as soon as they would wish.

## 2014-03-06 NOTE — Telephone Encounter (Signed)
Pam from Dr Ralene Muskrat office calling to see if we can do a clearance on [name] [name] to have Another kidney stone surgically removed.   Ureteroscopy  Procedure   Do you need her to come in? Or to fill out the paper work?

## 2014-03-07 ENCOUNTER — Ambulatory Visit (INDEPENDENT_AMBULATORY_CARE_PROVIDER_SITE_OTHER): Payer: Medicare Other | Admitting: Family Medicine

## 2014-03-07 ENCOUNTER — Other Ambulatory Visit: Payer: Self-pay | Admitting: Family Medicine

## 2014-03-07 ENCOUNTER — Encounter: Payer: Self-pay | Admitting: Family Medicine

## 2014-03-07 VITALS — BP 110/78 | Ht 61.0 in

## 2014-03-07 DIAGNOSIS — N200 Calculus of kidney: Secondary | ICD-10-CM | POA: Diagnosis not present

## 2014-03-07 DIAGNOSIS — R062 Wheezing: Secondary | ICD-10-CM | POA: Diagnosis not present

## 2014-03-07 DIAGNOSIS — I10 Essential (primary) hypertension: Secondary | ICD-10-CM | POA: Diagnosis not present

## 2014-03-07 NOTE — Telephone Encounter (Signed)
Office visit for surgical clearance scheduled.

## 2014-03-07 NOTE — Progress Notes (Addendum)
   Subjective:    Patient ID: [name] [name], female    DOB: 02-06-1924, 80 y.o.   MRN: [id number]  HPI Patient is here today for a surgical clearance to have a kidney stone removed. Surgery date TBA. Patient has no other concerns at this time. Patient is doing well.  Apparently has been doing well with her medications eating okay stable overall. Patient does have hypertension, reflux, diabetes, Alzheimer's, Parkinson's, benign essential tremor, arthritis. Medications reviewed stable. Review of Systems Family denies pain fever sweats chills or other problems.    Objective:   Physical Exam Upper airway-glottic respiratory sounds are noted. The intensity varies throughout the visit. And patient not in respiratory distress small airways no wheezing no crackles Heart regular Extremities no edema       Assessment & Plan:  #1-kidney stone-she is in need a surgical procedure to help take care of this I think she should do fine with that. I doubt that there be any type of major complication. I believe that she can successfully undergo both conscious sedation and general anesthesia. If possible conscious sedation would have less potential for secondary fracture.  Respiratory status-this patient is seen in ENT as well as pulmonologist neither of them believe that the patient is having any type to bear weight it is felt that this noise is coming from the upper airway and that there is no sign of any type of blockage or other issues going on.  Alzheimer's stable-certainly any long surgical procedure does have slight cardiac risk with this patient also the more medication that she is treated with it could cause some short-term behavioral issues with her Alzheimer's.  This is also to note that the patient was seen face-to-face. Home health was consult and regarding kidney stone. Patient is going to have surgery and will need close followup care provided at her rest home via home health. Nursing visits  are required. The patient is homebound to the facility because of her Alzheimer's and lack of mobility.

## 2014-03-12 ENCOUNTER — Other Ambulatory Visit: Payer: Self-pay | Admitting: Urology

## 2014-03-13 ENCOUNTER — Encounter (HOSPITAL_COMMUNITY): Payer: Self-pay | Admitting: Pharmacy Technician

## 2014-03-14 ENCOUNTER — Ambulatory Visit: Payer: Medicare Other | Admitting: Family Medicine

## 2014-03-14 ENCOUNTER — Telehealth: Payer: Self-pay | Admitting: Family Medicine

## 2014-03-14 NOTE — Telephone Encounter (Signed)
[facility] home [facility] needs face to face signed attached to chart.

## 2014-03-17 ENCOUNTER — Encounter (HOSPITAL_COMMUNITY): Payer: Self-pay | Admitting: Pharmacy Technician

## 2014-03-17 ENCOUNTER — Other Ambulatory Visit: Payer: Self-pay | Admitting: Family Medicine

## 2014-03-17 NOTE — Telephone Encounter (Signed)
This was completed

## 2014-03-21 ENCOUNTER — Encounter (HOSPITAL_COMMUNITY)
Admission: RE | Admit: 2014-03-21 | Discharge: 2014-03-21 | Disposition: A | Discharge status: Home / Self Care | Payer: Medicare Other | Source: Ambulatory Visit | Attending: Urology | Admitting: Urology

## 2014-03-21 ENCOUNTER — Encounter (HOSPITAL_COMMUNITY): Payer: Self-pay

## 2014-03-21 DIAGNOSIS — Z01812 Encounter for preprocedural laboratory examination: Secondary | ICD-10-CM | POA: Diagnosis not present

## 2014-03-21 HISTORY — DX: Atherosclerotic heart disease of native coronary artery without angina pectoris: I25.10

## 2014-03-21 HISTORY — DX: Shortness of breath: R06.02

## 2014-03-21 HISTORY — DX: Cerebral infarction, unspecified: I63.9

## 2014-03-21 HISTORY — DX: Personal history of urinary calculi: Z87.442

## 2014-03-21 LAB — BASIC METABOLIC PANEL
BUN: 26 mg/dL — ABNORMAL HIGH (ref 6–23)
CO2: 26 meq/L (ref 19–32)
Calcium: 9.4 mg/dL (ref 8.4–10.5)
Chloride: 105 mEq/L (ref 96–112)
Creatinine, Ser: 1.15 mg/dL — ABNORMAL HIGH (ref 0.50–1.10)
GFR calc Af Amer: 48 mL/min — ABNORMAL LOW (ref >90)
GFR calc non Af Amer: 42 mL/min — ABNORMAL LOW (ref >90)
GLUCOSE: 105 mg/dL — AB (ref 70–99)
POTASSIUM: 4.8 meq/L (ref 3.7–5.3)
Sodium: 141 mEq/L (ref 137–147)

## 2014-03-21 LAB — CBC
HEMATOCRIT: 40 % (ref 36.0–46.0)
HEMOGLOBIN: 12.5 g/dL (ref 12.0–15.0)
MCH: 31.7 pg (ref 26.0–34.0)
MCHC: 31.3 g/dL (ref 30.0–36.0)
MCV: 101.5 fL — AB (ref 78.0–100.0)
PLATELETS: 232 10*3/uL (ref 150–400)
RBC: 3.94 MIL/uL (ref 3.87–5.11)
RDW: 13.6 % (ref 11.5–15.5)
WBC: 7.7 10*3/uL (ref 4.0–10.5)

## 2014-03-21 NOTE — Progress Notes (Signed)
03/21/14 1217  OBSTRUCTIVE SLEEP APNEA  Have you ever been diagnosed with sleep apnea through a sleep study? No (QUESTIONS ANSWERED BY FAMILY - PT HAS ALZHEIMERS)  Do you snore loudly (loud enough to be heard through closed doors)?  0  Do you often feel tired, fatigued, or sleepy during the daytime? 1  Has anyone observed you stop breathing during your sleep? 0  Do you have, or are you being treated for high blood pressure? 1  BMI more than 35 kg/m2? 0  Age over 65 years old? 1  Neck circumference greater than 40 cm/16 inches? 1  Gender: 0  Obstructive Sleep Apnea Score 4  Score 4 or greater  Results sent to PCP

## 2014-03-21 NOTE — Pre-Procedure Instructions (Signed)
CT CHEST REPORT 01-17-14 IN EPIC EKG REPORT 12-25-13 IN EPIC SURGICAL CLEARANCE FROM DR. Wolfgang Phoenix ON CHART.

## 2014-03-21 NOTE — Patient Instructions (Addendum)
YOUR SURGERY IS SCHEDULED AT [facility]  ON:  Tuesday  5/5  REPORT TO  SHORT STAY CENTER AT:  7:00 AM      PHONE # FOR SHORT STAY IS [phone]  DO NOT EAT OR DRINK ANYTHING AFTER MIDNIGHT THE NIGHT BEFORE YOUR SURGERY.  YOU MAY BRUSH YOUR TEETH, RINSE OUT YOUR MOUTH--BUT NO WATER, NO FOOD, NO CHEWING GUM, NO MINTS, NO CANDIES, NO CHEWING TOBACCO.  DO NOT GIVE ANY MEDICATIONS DAY OF SURGERY.  I DO NOT BRING VALUABLES, MONEY, CREDIT CARDS.  DO NOT WEAR JEWELRY, MAKE-UP, NAIL POLISH AND NO METAL PINS OR CLIPS IN YOUR HAIR. CONTACT LENS, DENTURES / PARTIALS, GLASSES SHOULD NOT BE WORN TO SURGERY AND IN MOST CASES-HEARING AIDS WILL NEED TO BE REMOVED.  BRING YOUR GLASSES CASE, ANY EQUIPMENT NEEDED FOR YOUR CONTACT LENS. FOR PATIENTS ADMITTED TO THE HOSPITAL--CHECK OUT TIME THE DAY OF DISCHARGE IS 11:00 AM.  ALL INPATIENT ROOMS ARE PRIVATE - WITH BATHROOM, TELEPHONE, TELEVISION AND WIFI INTERNET.  IF YOU ARE BEING DISCHARGED THE SAME DAY OF YOUR SURGERY--YOU CAN NOT DRIVE YOURSELF HOME--AND SHOULD NOT GO HOME ALONE BY TAXI OR BUS.  NO DRIVING OR OPERATING MACHINERY, OR MAKING LEGAL DECISIONS FOR 24 HOURS FOLLOWING ANESTHESIA / PAIN MEDICATIONS.  PLEASE MAKE ARRANGEMENTS FOR SOMEONE TO BE WITH YOU AT HOME THE FIRST 24 HOURS AFTER SURGERY. RESPONSIBLE DRIVER'S NAME / PHONE  PT'S [name] [name] BE AWARE THAT YOU MAY NEED ADDITIONAL BLOOD DRAWN DAY OF YOUR SURGERY  _______________________________________________________________________   [facility] - Preparing for Surgery Before surgery, you can play an important role.  Because skin is not sterile, your skin needs to be as free of germs as possible.  You can reduce the number of germs on your skin by washing with CHG (chlorahexidine gluconate) soap before surgery.  CHG is an antiseptic cleaner which kills germs and bonds with the skin to continue  killing germs even after washing. Please DO NOT use if you have an allergy to CHG or antibacterial soaps.  If your skin becomes reddened/irritated stop using the CHG and inform your nurse when you arrive at Short Stay. Do not shave (including legs and underarms) for at least 48 hours prior to the first CHG shower.  You may shave your face. Please follow these instructions carefully:  1.  Shower with CHG Soap the night before surgery and the  morning of Surgery.  2.  If you choose to wash your hair, wash your hair first as usual with your  normal  shampoo.  3.  After you shampoo, rinse your hair and body thoroughly to remove the  shampoo.                           4.  Use CHG as you would any other liquid soap.  You can apply chg directly  to the skin and wash                       Gently with a scrungie or clean washcloth.  5.  Apply the CHG Soap to your  body ONLY FROM THE NECK DOWN.   Do not use on open                           Wound or open sores. Avoid contact with eyes, ears mouth and genitals (private parts).                        Genitals (private parts) with your normal soap.             6.  Wash thoroughly, paying special attention to the area where your surgery  will be performed.  7.  Thoroughly rinse your body with warm water from the neck down.  8.  DO NOT shower/wash with your normal soap after using and rinsing off  the CHG Soap.                9.  Pat yourself dry with a clean towel.            10.  Wear clean pajamas.            11.  Place clean sheets on your bed the night of your first shower and do not  sleep with pets. Day of Surgery : Do not apply any lotions/deodorants the morning of surgery.  Please wear clean clothes to the hospital/surgery center.  FAILURE TO FOLLOW THESE INSTRUCTIONS MAY RESULT IN THE CANCELLATION OF YOUR SURGERY PATIENT SIGNATURE_________________________________  NURSE  SIGNATURE__________________________________  ________________________________________________________________________

## 2014-03-24 NOTE — Pre-Procedure Instructions (Signed)
Notified Dr. Marcell Barlow that pt's meds have to be crushed and put in apple sauce - none of her preop meds are needed for heart - Dr. Marcell Barlow said pt should not take any meds day of surgery.  That information was given to pt's daughter with the other preop instructions along with note for assisted living to be sure to send med list with pt day of surgery and note last time each med was given.

## 2014-03-24 NOTE — H&P (Signed)
tive Problems  1. Bilateral kidney stones (592.0)  2. History of urinary tract infection (V13.02)  3. Renal cyst, acquired (593.2)  4. Urinary incontinence without sensory awareness (788.34)  History of Present Illness   [name] [name] returns today in f/u for her history of renal stones and UTI's.   She had been on amoxicillin 250mg nightly for suppression but she apparently hasn't been on it recently.  She has had several visits to the ER for some breathing issues and she has been on antibiotics and steroids for that.  She hasn't had any documented UTI's since last year.    She had a 7mm LLP stone on KUB prior to her last visit with me in 1/14 but has had a couple of CT's since with the last in 12/14 that shows an increase in the size of the stone which had moved to the renal pelvis.   She has a complex cystic mass on the RLP with calcium deposits but that has been stable.  A KUB prior to this visit now shows that the stone is 21 x 13mm on the left the right calcified cyst is stable.  She started having some LLQ pain intermittantly for 6 months.  The pain is not severe.  It is worse with transfers.   She has no dysuria or hematuria.  She has persistent incontinence requiring depends.   Past Medical History  1. History of Alzheimer's disease (331.0)  2. History of Anxiety (300.00)  3. History of Arthritis (V13.4)  4. History of Gastric ulcer (531.90)  5. History of diabetes mellitus (V12.29)  6. History of esophageal reflux (V12.79)  7. History of hypercholesterolemia (V12.29)  8. History of hypertension (V12.59)  9. History of urinary tract infection (V13.02)  10. History of Vocal cord dysfunction (478.5)  Surgical History  1. History of Cholecystectomy  2. History of Cystoscopy With Ureteroscopy  3. History of Hysterectomy  4. History of Knee Replacement  5. History of Lithotripsy  6. History of Neck Surgery  Current Meds  1. Acetaminophen 500 MG Oral Tablet;  Therapy:  (Recorded:21Mar2013) to Recorded  2. Aricept 5 MG Oral Tablet;  Therapy: (Recorded:10Apr2015) to Recorded  3. Artificial Tears SOLN;  Therapy: (Recorded:19Jul2013) to Recorded  4. Aspirin 81 MG Oral Tablet Chewable;  Therapy: (Recorded:19Jul2013) to Recorded  5. Atropine Sulfate 1 % Ophthalmic Solution;  Therapy: 15Oct2012 to Recorded  6. Benicar 20 MG Oral Tablet;  Therapy: (Recorded:10Apr2015) to Recorded  7. Boniva 150 MG Oral Tablet;  Therapy: (Recorded:21Mar2013) to Recorded  8. Carafate 1 GM Oral Tablet;  Therapy: (Recorded:10Apr2015) to Recorded  9. Citalopram Hydrobromide 20 MG Oral Tablet;  Therapy: 01Oct2012 to Recorded  10. Claritin 10 MG Oral Tablet;   Therapy: (Recorded:10Apr2015) to Recorded  11. Docusate Sodium 100 MG Oral Capsule;   Therapy: (Recorded:21Mar2013) to Recorded  12. Durezol 0.05 % Ophthalmic Emulsion;   Therapy: 15Oct2012 to Recorded  13. Elocon 0.1 % External Cream;   Therapy: (Recorded:19Jul2013) to Recorded  14. Insta-Glucose GEL;   Therapy: (Recorded:19Jul2013) to Recorded  15. Losartan Potassium 25 MG Oral Tablet;   Therapy: 01Oct2012 to Recorded  16. MiraLax POWD;   Therapy: (Recorded:10Apr2015) to Recorded  17. Mysoline 50 MG Oral Tablet;   Therapy: (Recorded:10Apr2015) to Recorded  18. Namenda 10 MG Oral Tablet;   Therapy: 11Oct2012 to Recorded  19. Omeprazole 40 MG Oral Capsule Delayed Release;   Therapy: 28Dec2012 to Recorded  20. Sucralfate 1 GM Oral Tablet;   Therapy: 23Nov2012 to Recorded  21.   Vitamin B Complex Oral Tablet;   Therapy: (Recorded:21Mar2013) to Recorded  22. Vitamin C TABS;   Therapy: (Recorded:21Mar2013) to Recorded  23. Vitamin E 400 UNIT Oral Capsule;   Therapy: (Recorded:19Jul2013) to Recorded  24. Zofran 4 MG Oral Tablet;   Therapy: (Recorded:19Jul2013) to Recorded  Allergies  1. Codeine Derivatives  2. Morphine Derivatives  Family History  1. Family history of Death In The Family Father  2. Family  history of Death In The Family Mother  3. Family history of Diabetes Mellitus (V18.0)  4. Family history of Family Health Status Number Of Children  5. Family history of Stroke Syndrome (V17.1) : Mother  Social History  1. Denied: History of Alcohol Use  2. Denied: History of Caffeine Use  3. Marital History - Widowed  4. Never A Smoker  5. Retired From Work      Past, family and social history reviewed and updated.   She lives and [employer].   Review of Systems Genitourinary, constitutional, skin, eye, otolaryngeal, hematologic/lymphatic, cardiovascular, pulmonary, endocrine, musculoskeletal, gastrointestinal, neurological and psychiatric system(s) were reviewed and pertinent findings if present are noted.  Genitourinary: incontinence, but no hematuria.  Gastrointestinal: flank pain, but no diarrhea and no constipation.  Constitutional: no fever.  Cardiovascular: leg swelling (mild off of lasix), but no chest pain.  Respiratory: wheezing, but no shortness of breath.  Neurological: memory loss.    Vitals Vital Signs [Data Includes: Last 1 Day]  Recorded: 10Apr2015 03:26PM  Blood Pressure: 110 / 70 Temperature: 98 F Heart Rate: 80  Physical Exam Constitutional: Well nourished and well developed . No acute distress.  ENT:. The ears and nose are normal in appearance.  Neck: The appearance of the neck is normal and no neck mass is present.  Pulmonary: No respiratory distress and normal respiratory rhythm and effort. Diffuse wheezing heard on ascultation.  Cardiovascular: Heart rate and rhythm are normal. with mild ankle edema.  Abdomen: The abdomen is obese. The abdomen is soft and nontender (except for mild LLQ tenderness. ). No masses are palpated. No CVA tenderness. No hernias are palpable. No hepatosplenomegaly noted.  Lymphatics: The supraclavicular, femoral and inguinal nodes are not enlarged or tender.  Skin: Normal skin turgor, no visible rash and no visible  skin lesions.  She has poor memory and limited response to questions.    Results/Data Urine [Data Includes: Last 1 Day]   [id number]  COLOR YELLOW   APPEARANCE CLEAR   SPECIFIC GRAVITY >1.030   pH 5.0   GLUCOSE NEG mg/dL  BILIRUBIN NEG   KETONE NEG mg/dL  BLOOD NEG   PROTEIN NEG mg/dL  UROBILINOGEN 0.2 mg/dL  NITRITE NEG   LEUKOCYTE ESTERASE NEG    The following images/tracing/specimen were independently visualized:  I have reviwed her recent KUB films and report and compared prior films.    Procedure  CAth Specimen was obtained.     Assessment  1. Bilateral kidney stones (592.0)  2. Renal cyst, acquired (593.2)  3. Urinary incontinence without sensory awareness (788.34)  4. History of urinary tract infection (V13.02)   She has progressive enlargement of the left renal stone on KUB and it had moved to the renal pelvic on her most recent CT.   Her pain may or may not be related. The renal cyst is complex but stable and in her condition further treatment is not really warranted. Her UA today is clear. She has persistent incontinence.   Plan Bilateral kidney stones   1. CT-ABD/PELVIS  W/O CONTRAST; Status:Hold For - Appointment,PreCert,Date of  Service,Print; Requested for:10Apr2015;  PMH: History of urinary tract infection   2. Cath for Specimen (MC); Status:Hold For - Appointment,Date of Service; Requested  for:10Apr2015;   3. URINE CULTURE; Status:Hold For - Specimen/Data Collection,Appointment; Requested  for:10Apr2015;  Urinary incontinence without sensory awareness   4. UA With REFLEX; [Do Not Release]; Status:Hold For - Specimen/Data Collection;  Requested for:13Jan2015;    I am going to get a CT stone study to further assess the stone before considering therapy.    She may need stenting and ESWL vs a percutaneous nephrolithotomy. I am not going to resume the amoxicillin.  I will call with the CT result and arrange f/u accordingly.   Discussion/Summary   CC:  Dr. Scott Luking.   

## 2014-03-25 ENCOUNTER — Ambulatory Visit (HOSPITAL_COMMUNITY): Payer: Medicare Other | Admitting: Anesthesiology

## 2014-03-25 ENCOUNTER — Encounter (HOSPITAL_COMMUNITY): Payer: Medicare Other | Admitting: Anesthesiology

## 2014-03-25 ENCOUNTER — Encounter (HOSPITAL_COMMUNITY)
Admission: RE | Disposition: A | Discharge status: Nursing Home (Includes ICF) | Payer: Self-pay | Source: Ambulatory Visit | Attending: Urology

## 2014-03-25 ENCOUNTER — Inpatient Hospital Stay (HOSPITAL_COMMUNITY)
Admission: RE | Admit: 2014-03-25 | Discharge: 2014-03-28 | DRG: 668 | Disposition: A | Discharge status: Nursing Home (Includes ICF) | Payer: Medicare Other | Source: Ambulatory Visit | Attending: Urology | Admitting: Urology

## 2014-03-25 ENCOUNTER — Encounter (HOSPITAL_COMMUNITY): Payer: Self-pay | Admitting: *Deleted

## 2014-03-25 DIAGNOSIS — M1990 Unspecified osteoarthritis, unspecified site: Secondary | ICD-10-CM | POA: Diagnosis present

## 2014-03-25 DIAGNOSIS — K5730 Diverticulosis of large intestine without perforation or abscess without bleeding: Secondary | ICD-10-CM | POA: Diagnosis not present

## 2014-03-25 DIAGNOSIS — G309 Alzheimer's disease, unspecified: Secondary | ICD-10-CM | POA: Diagnosis present

## 2014-03-25 DIAGNOSIS — M810 Age-related osteoporosis without current pathological fracture: Secondary | ICD-10-CM | POA: Diagnosis present

## 2014-03-25 DIAGNOSIS — G9340 Encephalopathy, unspecified: Secondary | ICD-10-CM | POA: Diagnosis present

## 2014-03-25 DIAGNOSIS — N183 Chronic kidney disease, stage 3 unspecified: Secondary | ICD-10-CM | POA: Diagnosis present

## 2014-03-25 DIAGNOSIS — Z9089 Acquired absence of other organs: Secondary | ICD-10-CM

## 2014-03-25 DIAGNOSIS — I2510 Atherosclerotic heart disease of native coronary artery without angina pectoris: Secondary | ICD-10-CM | POA: Diagnosis present

## 2014-03-25 DIAGNOSIS — Z8673 Personal history of transient ischemic attack (TIA), and cerebral infarction without residual deficits: Secondary | ICD-10-CM | POA: Diagnosis not present

## 2014-03-25 DIAGNOSIS — H5440 Blindness, one eye, unspecified eye: Secondary | ICD-10-CM | POA: Diagnosis present

## 2014-03-25 DIAGNOSIS — N3080 Other cystitis without hematuria: Secondary | ICD-10-CM | POA: Diagnosis present

## 2014-03-25 DIAGNOSIS — G20A1 Parkinson's disease without dyskinesia, without mention of fluctuations: Secondary | ICD-10-CM | POA: Diagnosis present

## 2014-03-25 DIAGNOSIS — R509 Fever, unspecified: Secondary | ICD-10-CM | POA: Diagnosis not present

## 2014-03-25 DIAGNOSIS — E785 Hyperlipidemia, unspecified: Secondary | ICD-10-CM | POA: Diagnosis present

## 2014-03-25 DIAGNOSIS — G20 Parkinson's disease: Secondary | ICD-10-CM | POA: Diagnosis present

## 2014-03-25 DIAGNOSIS — K449 Diaphragmatic hernia without obstruction or gangrene: Secondary | ICD-10-CM | POA: Diagnosis present

## 2014-03-25 DIAGNOSIS — R1032 Left lower quadrant pain: Secondary | ICD-10-CM | POA: Diagnosis not present

## 2014-03-25 DIAGNOSIS — N3942 Incontinence without sensory awareness: Secondary | ICD-10-CM | POA: Diagnosis present

## 2014-03-25 DIAGNOSIS — Z8711 Personal history of peptic ulcer disease: Secondary | ICD-10-CM | POA: Diagnosis not present

## 2014-03-25 DIAGNOSIS — Z833 Family history of diabetes mellitus: Secondary | ICD-10-CM

## 2014-03-25 DIAGNOSIS — L0233 Carbuncle of buttock: Secondary | ICD-10-CM | POA: Diagnosis present

## 2014-03-25 DIAGNOSIS — Z87440 Personal history of urinary (tract) infections: Secondary | ICD-10-CM

## 2014-03-25 DIAGNOSIS — Z01812 Encounter for preprocedural laboratory examination: Secondary | ICD-10-CM | POA: Diagnosis not present

## 2014-03-25 DIAGNOSIS — E119 Type 2 diabetes mellitus without complications: Secondary | ICD-10-CM | POA: Diagnosis present

## 2014-03-25 DIAGNOSIS — E871 Hypo-osmolality and hyponatremia: Secondary | ICD-10-CM | POA: Diagnosis not present

## 2014-03-25 DIAGNOSIS — R5082 Postprocedural fever: Secondary | ICD-10-CM | POA: Diagnosis not present

## 2014-03-25 DIAGNOSIS — K219 Gastro-esophageal reflux disease without esophagitis: Secondary | ICD-10-CM | POA: Diagnosis present

## 2014-03-25 DIAGNOSIS — Z823 Family history of stroke: Secondary | ICD-10-CM

## 2014-03-25 DIAGNOSIS — E7800 Pure hypercholesterolemia, unspecified: Secondary | ICD-10-CM | POA: Diagnosis present

## 2014-03-25 DIAGNOSIS — N200 Calculus of kidney: Secondary | ICD-10-CM | POA: Diagnosis not present

## 2014-03-25 DIAGNOSIS — R630 Anorexia: Secondary | ICD-10-CM | POA: Diagnosis not present

## 2014-03-25 DIAGNOSIS — F0280 Dementia in other diseases classified elsewhere without behavioral disturbance: Secondary | ICD-10-CM | POA: Diagnosis present

## 2014-03-25 DIAGNOSIS — J383 Other diseases of vocal cords: Secondary | ICD-10-CM | POA: Diagnosis present

## 2014-03-25 DIAGNOSIS — Z96659 Presence of unspecified artificial knee joint: Secondary | ICD-10-CM

## 2014-03-25 DIAGNOSIS — F411 Generalized anxiety disorder: Secondary | ICD-10-CM | POA: Diagnosis present

## 2014-03-25 DIAGNOSIS — Z87442 Personal history of urinary calculi: Secondary | ICD-10-CM

## 2014-03-25 DIAGNOSIS — Z01818 Encounter for other preprocedural examination: Secondary | ICD-10-CM | POA: Diagnosis not present

## 2014-03-25 DIAGNOSIS — N1330 Unspecified hydronephrosis: Secondary | ICD-10-CM | POA: Diagnosis present

## 2014-03-25 DIAGNOSIS — I10 Essential (primary) hypertension: Secondary | ICD-10-CM | POA: Diagnosis not present

## 2014-03-25 DIAGNOSIS — I129 Hypertensive chronic kidney disease with stage 1 through stage 4 chronic kidney disease, or unspecified chronic kidney disease: Secondary | ICD-10-CM | POA: Diagnosis present

## 2014-03-25 HISTORY — PX: CYSTOSCOPY/RETROGRADE/URETEROSCOPY: SHX5316

## 2014-03-25 HISTORY — PX: HOLMIUM LASER APPLICATION: SHX5852

## 2014-03-25 LAB — MRSA PCR SCREENING: MRSA BY PCR: NEGATIVE

## 2014-03-25 LAB — GLUCOSE, CAPILLARY
Glucose-Capillary: 125 mg/dL — ABNORMAL HIGH (ref 70–99)
Glucose-Capillary: 117 mg/dL — ABNORMAL HIGH (ref 70–99)

## 2014-03-25 SURGERY — CYSTOSCOPY/RETROGRADE/URETEROSCOPY
Anesthesia: General | Laterality: Left

## 2014-03-25 MED ORDER — FENTANYL CITRATE 0.05 MG/ML IJ SOLN
INTRAMUSCULAR | Status: DC | PRN
  Administered 2014-03-25 (×2): 25 ug via INTRAVENOUS
  Administered 2014-03-25: 50 ug via INTRAVENOUS

## 2014-03-25 MED ORDER — ACETAMINOPHEN 325 MG PO TABS
650.0000 mg | ORAL_TABLET | ORAL | Status: DC | PRN
  Administered 2014-03-25 – 2014-03-28 (×5): 650 mg via ORAL
  Filled 2014-03-25 (×6): qty 2

## 2014-03-25 MED ORDER — SUCRALFATE 1 G PO TABS
2.0000 g | ORAL_TABLET | Freq: Every day | ORAL | Status: DC
  Administered 2014-03-25 – 2014-03-27 (×3): 2 g via ORAL
  Filled 2014-03-25 (×4): qty 2

## 2014-03-25 MED ORDER — IOHEXOL 300 MG/ML  SOLN
INTRAMUSCULAR | Status: DC | PRN
Start: 2014-03-25 — End: 2014-03-25
  Administered 2014-03-25: 10 mL

## 2014-03-25 MED ORDER — MEMANTINE HCL 10 MG PO TABS
10.0000 mg | ORAL_TABLET | Freq: Two times a day (BID) | ORAL | Status: DC
  Administered 2014-03-25 – 2014-03-28 (×7): 10 mg via ORAL
  Filled 2014-03-25 (×8): qty 1

## 2014-03-25 MED ORDER — LIDOCAINE HCL (CARDIAC) 20 MG/ML IV SOLN
INTRAVENOUS | Status: AC
  Filled 2014-03-25: qty 5

## 2014-03-25 MED ORDER — LIDOCAINE HCL 2 % EX GEL
CUTANEOUS | Status: AC
  Filled 2014-03-25: qty 10

## 2014-03-25 MED ORDER — POLYETHYLENE GLYCOL 3350 17 G PO PACK
17.0000 g | PACK | Freq: Every day | ORAL | Status: DC
  Administered 2014-03-26 – 2014-03-28 (×2): 17 g via ORAL
  Filled 2014-03-25 (×4): qty 1

## 2014-03-25 MED ORDER — CIPROFLOXACIN HCL 250 MG PO TABS
250.0000 mg | ORAL_TABLET | Freq: Two times a day (BID) | ORAL | Status: DC
  Administered 2014-03-25 – 2014-03-26 (×3): 250 mg via ORAL
  Filled 2014-03-25 (×6): qty 1

## 2014-03-25 MED ORDER — LORATADINE 10 MG PO TABS
10.0000 mg | ORAL_TABLET | Freq: Every day | ORAL | Status: DC
  Administered 2014-03-25 – 2014-03-28 (×4): 10 mg via ORAL
  Filled 2014-03-25 (×4): qty 1

## 2014-03-25 MED ORDER — PRIMIDONE 50 MG PO TABS
25.0000 mg | ORAL_TABLET | Freq: Every day | ORAL | Status: DC
  Administered 2014-03-25 – 2014-03-27 (×3): 25 mg via ORAL
  Filled 2014-03-25 (×4): qty 0.5

## 2014-03-25 MED ORDER — KCL IN DEXTROSE-NACL 20-5-0.45 MEQ/L-%-% IV SOLN
INTRAVENOUS | Status: AC
  Filled 2014-03-25: qty 1000

## 2014-03-25 MED ORDER — DONEPEZIL HCL 5 MG PO TABS
5.0000 mg | ORAL_TABLET | Freq: Every morning | ORAL | Status: DC
  Administered 2014-03-25 – 2014-03-28 (×4): 5 mg via ORAL
  Filled 2014-03-25 (×4): qty 1

## 2014-03-25 MED ORDER — KCL IN DEXTROSE-NACL 20-5-0.45 MEQ/L-%-% IV SOLN
INTRAVENOUS | Status: DC
  Administered 2014-03-25 – 2014-03-27 (×3): via INTRAVENOUS
  Filled 2014-03-25 (×5): qty 1000

## 2014-03-25 MED ORDER — SODIUM CHLORIDE 0.9 % IR SOLN
Status: DC | PRN
  Administered 2014-03-25: 5000 mL via INTRAVESICAL

## 2014-03-25 MED ORDER — LIDOCAINE HCL 1 % IJ SOLN
INTRAMUSCULAR | Status: DC | PRN
  Administered 2014-03-25: 80 mg via INTRADERMAL

## 2014-03-25 MED ORDER — HYPROMELLOSE (GONIOSCOPIC) 2.5 % OP SOLN
1.0000 [drp] | Freq: Three times a day (TID) | OPHTHALMIC | Status: DC | PRN
  Filled 2014-03-25: qty 15

## 2014-03-25 MED ORDER — CLOTRIMAZOLE 1 % EX CREA
TOPICAL_CREAM | Freq: Two times a day (BID) | CUTANEOUS | Status: DC
  Administered 2014-03-25 – 2014-03-28 (×6): via TOPICAL
  Filled 2014-03-25: qty 15

## 2014-03-25 MED ORDER — PANTOPRAZOLE SODIUM 40 MG PO TBEC
80.0000 mg | DELAYED_RELEASE_TABLET | Freq: Every day | ORAL | Status: DC
Start: 2014-03-25 — End: 2014-03-26
  Filled 2014-03-25: qty 2

## 2014-03-25 MED ORDER — CIPROFLOXACIN IN D5W 400 MG/200ML IV SOLN
400.0000 mg | INTRAVENOUS | Status: AC
  Administered 2014-03-25: 400 mg via INTRAVENOUS

## 2014-03-25 MED ORDER — HYDROCODONE-ACETAMINOPHEN 5-325 MG PO TABS
1.0000 | ORAL_TABLET | ORAL | Status: DC | PRN
Start: 2014-03-25 — End: 2014-03-25

## 2014-03-25 MED ORDER — PROPOFOL 10 MG/ML IV BOLUS
INTRAVENOUS | Status: DC | PRN
  Administered 2014-03-25: 150 mg via INTRAVENOUS

## 2014-03-25 MED ORDER — POLYVINYL ALCOHOL 1.4 % OP SOLN
1.0000 [drp] | Freq: Three times a day (TID) | OPHTHALMIC | Status: DC | PRN
  Filled 2014-03-25: qty 15

## 2014-03-25 MED ORDER — FENTANYL CITRATE 0.05 MG/ML IJ SOLN
25.0000 ug | INTRAMUSCULAR | Status: DC | PRN
  Administered 2014-03-25 (×4): 25 ug via INTRAVENOUS

## 2014-03-25 MED ORDER — DIPHENHYDRAMINE HCL 12.5 MG/5ML PO ELIX: 12.5000 mg | ORAL_SOLUTION | Freq: Four times a day (QID) | ORAL | Status: DC | PRN

## 2014-03-25 MED ORDER — FENTANYL CITRATE 0.05 MG/ML IJ SOLN
INTRAMUSCULAR | Status: AC
  Filled 2014-03-25: qty 2

## 2014-03-25 MED ORDER — BACITRACIN-POLYMYXIN B 500-10000 UNIT/GM OP OINT
1.0000 "application " | TOPICAL_OINTMENT | Freq: Every day | OPHTHALMIC | Status: DC
  Administered 2014-03-25 – 2014-03-27 (×3): 1 via OPHTHALMIC
  Filled 2014-03-25: qty 3.5

## 2014-03-25 MED ORDER — HYOSCYAMINE SULFATE 0.125 MG SL SUBL
0.1250 mg | SUBLINGUAL_TABLET | SUBLINGUAL | Status: DC | PRN
Start: 2014-03-25 — End: 2014-03-28
  Filled 2014-03-25: qty 1

## 2014-03-25 MED ORDER — CIPROFLOXACIN IN D5W 400 MG/200ML IV SOLN
INTRAVENOUS | Status: AC
  Filled 2014-03-25: qty 200

## 2014-03-25 MED ORDER — PROPOFOL 10 MG/ML IV BOLUS
INTRAVENOUS | Status: AC
  Filled 2014-03-25: qty 20

## 2014-03-25 MED ORDER — HYDROMORPHONE HCL PF 1 MG/ML IJ SOLN
0.5000 mg | INTRAMUSCULAR | Status: DC | PRN
  Administered 2014-03-26: 1 mg via INTRAVENOUS
  Filled 2014-03-25: qty 1

## 2014-03-25 MED ORDER — DIPHENHYDRAMINE HCL 50 MG/ML IJ SOLN: 12.5000 mg | Freq: Four times a day (QID) | INTRAMUSCULAR | Status: DC | PRN

## 2014-03-25 MED ORDER — DIFLUPREDNATE 0.05 % OP EMUL
1.0000 [drp] | Freq: Every day | OPHTHALMIC | Status: DC
  Administered 2014-03-25 – 2014-03-27 (×3): 1 [drp] via OPHTHALMIC

## 2014-03-25 MED ORDER — ONDANSETRON HCL 4 MG/2ML IJ SOLN: 4.0000 mg | INTRAMUSCULAR | Status: DC | PRN

## 2014-03-25 MED ORDER — LACTATED RINGERS IV SOLN
INTRAVENOUS | Status: DC | PRN
  Administered 2014-03-25: 09:00:00 via INTRAVENOUS

## 2014-03-25 MED ORDER — TRAMADOL HCL 50 MG PO TABS
50.0000 mg | ORAL_TABLET | Freq: Four times a day (QID) | ORAL | Status: DC | PRN
  Administered 2014-03-25: 50 mg via ORAL
  Filled 2014-03-25 (×2): qty 1

## 2014-03-25 MED ORDER — CITALOPRAM HYDROBROMIDE 20 MG PO TABS
20.0000 mg | ORAL_TABLET | Freq: Every morning | ORAL | Status: DC
  Administered 2014-03-25 – 2014-03-28 (×4): 20 mg via ORAL
  Filled 2014-03-25 (×4): qty 1

## 2014-03-25 MED ORDER — IRBESARTAN 150 MG PO TABS
150.0000 mg | ORAL_TABLET | Freq: Every day | ORAL | Status: DC
Start: 2014-03-25 — End: 2014-03-28
  Administered 2014-03-25 – 2014-03-28 (×4): 150 mg via ORAL
  Filled 2014-03-25 (×4): qty 1

## 2014-03-25 SURGICAL SUPPLY — 19 items
BAG URO CATCHER STRL LF (DRAPE) ×3 IMPLANT
BASKET LASER NITINOL 1.9FR (BASKET) IMPLANT
BASKET ZERO TIP NITINOL 2.4FR (BASKET) IMPLANT
BSKT STON RTRVL ZERO TP 2.4FR (BASKET)
CATH URET 5FR 28IN OPEN ENDED (CATHETERS) ×3 IMPLANT
DRAPE CAMERA CLOSED 9X96 (DRAPES) ×3 IMPLANT
EXTRACTOR STONE NITINOL NGAGE (UROLOGICAL SUPPLIES) ×3 IMPLANT
FIBER LASER FLEXIVA 200 (UROLOGICAL SUPPLIES) ×3 IMPLANT
GLOVE SURG SS PI 8.0 STRL IVOR (GLOVE) ×3 IMPLANT
GOWN STRL REUS W/TWL XL LVL3 (GOWN DISPOSABLE) ×3 IMPLANT
GUIDEWIRE ANG ZIPWIRE 038X150 (WIRE) IMPLANT
GUIDEWIRE STR DUAL SENSOR (WIRE) ×3 IMPLANT
MANIFOLD NEPTUNE II (INSTRUMENTS) ×3 IMPLANT
PACK CYSTO (CUSTOM PROCEDURE TRAY) ×3 IMPLANT
SHEATH ACCESS URETERAL 38CM (SHEATH) ×3 IMPLANT
STENT CONTOUR 6FRX26X.038 (STENTS) ×3 IMPLANT
TUBING CONNECTING 10 (TUBING) ×2 IMPLANT
TUBING CONNECTING 10' (TUBING) ×1
WIRE COONS/BENSON .038X145CM (WIRE) IMPLANT

## 2014-03-25 NOTE — Anesthesia Postprocedure Evaluation (Signed)
  Anesthesia Post-op Note  Patient: [name] [name]  Procedure(s) Performed: Procedure(s) (LRB): CYSTOSCOPY WITH  LEFT URETEROSCOPY AND LEFT RETROGRADE  STENT PLACEMENT (Left) HOLMIUM LASER APPLICATION (Left)  Patient Location: PACU  Anesthesia Type: General  Level of Consciousness: awake and alert   Airway and Oxygen Therapy: Patient Spontanous Breathing  Post-op Pain: mild  Post-op Assessment: Post-op Vital signs reviewed, Patient's Cardiovascular Status Stable, Respiratory Function Stable, Patent Airway and No signs of Nausea or vomiting  Last Vitals:  Filed Vitals:   03/25/14 1308  BP: 151/61  Pulse: 68  Temp: 36.7 C  Resp: 12    Post-op Vital Signs: stable   Complications: No apparent anesthesia complications

## 2014-03-25 NOTE — Brief Op Note (Signed)
03/25/2014  10:36 AM  PATIENT:  [name] [name]  80 y.o. female  PRE-OPERATIVE DIAGNOSIS:  LEFT RENAL STONE  POST-OPERATIVE DIAGNOSIS:  LEFT RENAL STONE  PROCEDURE:  Procedure(s): CYSTOSCOPY WITH  LEFT URETEROSCOPY AND LEFT RETROGRADE  STENT PLACEMENT (Left) HOLMIUM LASER APPLICATION (Left) !st stage  SURGEON:  Surgeon(s) and Role:    * Irine Seal, MD - Primary  PHYSICIAN ASSISTANT:   ASSISTANTS: none   ANESTHESIA:   general  EBL:  Total I/O In: 900 [I.V.:900] Out: -   BLOOD ADMINISTERED:none  DRAINS: 6 x 26 left JJ stent   LOCAL MEDICATIONS USED:  NONE  SPECIMEN:  No Specimen  DISPOSITION OF SPECIMEN:  N/A  COUNTS:  YES  TOURNIQUET:  * No tourniquets in log *  DICTATION: .Other Dictation: Dictation Number [id number]  PLAN OF CARE: Admit for overnight observation  PATIENT DISPOSITION:  PACU - hemodynamically stable.   Delay start of Pharmacological VTE agent (>24hrs) due to surgical blood loss or risk of bleeding: not applicable

## 2014-03-25 NOTE — Transfer of Care (Signed)
Immediate Anesthesia Transfer of Care Note  Patient: [name] [name]  Procedure(s) Performed: Procedure(s): CYSTOSCOPY WITH  LEFT URETEROSCOPY AND LEFT RETROGRADE  STENT PLACEMENT (Left) HOLMIUM LASER APPLICATION (Left)  Patient Location: PACU  Anesthesia Type:General  Level of Consciousness: awake  Airway & Oxygen Therapy: Patient Spontanous Breathing and Patient connected to face mask oxygen  Post-op Assessment: Report given to PACU RN and Post -op Vital signs reviewed and stable  Post vital signs: Reviewed and stable  Complications: No apparent anesthesia complications

## 2014-03-25 NOTE — Discharge Instructions (Signed)

## 2014-03-25 NOTE — Progress Notes (Signed)
Patient ID: [name] [name], female   DOB: 02-Nov-1924, 79 y.o.   MRN: [id number] She is doing well post op but seems to have a headache.  She is afeb with stable VS.

## 2014-03-25 NOTE — Anesthesia Preprocedure Evaluation (Signed)
Anesthesia Evaluation  Patient identified by MRN, date of birth, ID band Patient awake    Reviewed: Allergy & Precautions, H&P , NPO status , Patient's Chart, lab work & pertinent test results  Airway Mallampati: II TM Distance: >3 FB Neck ROM: Full    Dental no notable dental hx.    Pulmonary shortness of breath,  breath sounds clear to auscultation  Pulmonary exam normal       Cardiovascular hypertension, Pt. on medications + CAD and + Peripheral Vascular Disease Rhythm:Regular Rate:Normal     Neuro/Psych  Headaches, PSYCHIATRIC DISORDERS Senile dementia.  Parkinson's  Neuromuscular disease CVA    GI/Hepatic Neg liver ROS, hiatal hernia, GERD-  Medicated,  Endo/Other  negative endocrine ROSdiabetes  Renal/GU Renal InsufficiencyRenal diseaseCKD stage 3  negative genitourinary   Musculoskeletal negative musculoskeletal ROS (+)   Abdominal   Peds negative pediatric ROS (+)  Hematology negative hematology ROS (+)   Anesthesia Other Findings   Reproductive/Obstetrics negative OB ROS                           Anesthesia Physical Anesthesia Plan  ASA: III  Anesthesia Plan: General   Post-op Pain Management:    Induction: Intravenous  Airway Management Planned: LMA  Additional Equipment:   Intra-op Plan:   Post-operative Plan: Extubation in OR  Informed Consent: I have reviewed the patients History and Physical, chart, labs and discussed the procedure including the risks, benefits and alternatives for the proposed anesthesia with the patient or authorized representative who has indicated his/her understanding and acceptance.   Dental advisory given  Plan Discussed with: CRNA  Anesthesia Plan Comments: (Discussed general and spinal. Plan general. Discussed increased risk of complications with patient's daughter who understands.)        Anesthesia Quick Evaluation

## 2014-03-25 NOTE — Interval H&P Note (Signed)
History and Physical Interval Note:  03/25/2014 8:42 AM  [name] [name]  has presented today for surgery, with the diagnosis of LEFT RENAL STONE  The various methods of treatment have been discussed with the patient and family. After consideration of risks, benefits and other options for treatment, the patient has consented to  Procedure(s): CYSTOSCOPY WITH  LEFT URETEROSCOPY AND LEFT RETROGRADE  STENT PLACEMENT (Left) HOLMIUM LASER APPLICATION (Left) as a surgical intervention .  The patient's history has been reviewed, patient examined, no change in status, stable for surgery.  I have reviewed the patient's chart and labs.  Questions were answered to the patient's satisfaction.     Irine Seal

## 2014-03-26 ENCOUNTER — Ambulatory Visit: Payer: Medicare Other | Admitting: Family Medicine

## 2014-03-26 MED ORDER — TRAMADOL HCL 50 MG PO TABS: 50.0000 mg | ORAL_TABLET | Freq: Four times a day (QID) | ORAL | Status: DC | PRN

## 2014-03-26 MED ORDER — PANTOPRAZOLE SODIUM 40 MG PO PACK
80.0000 mg | PACK | Freq: Every day | ORAL | Status: DC
  Administered 2014-03-26 – 2014-03-28 (×3): 80 mg
  Filled 2014-03-26 (×3): qty 40

## 2014-03-26 MED ORDER — CIPROFLOXACIN HCL 250 MG PO TABS: 250.0000 mg | ORAL_TABLET | Freq: Two times a day (BID) | ORAL | Status: DC

## 2014-03-26 NOTE — Progress Notes (Signed)
I spoke to Mrs. Rishel's nurse and she is not currently in a condition where she could return to assisted living and would need SNF.  I am going to cancel the DC and reevaluate her in the morning.  Hopefully by avoiding pain meds her status will return to baseline and she will be able to return to assisted living, but if not we will then work on SNF placement.

## 2014-03-26 NOTE — Progress Notes (Signed)
Clinical Social Work Department BRIEF PSYCHOSOCIAL ASSESSMENT 03/26/2014  Patient:  [name] [name], [name] [name]     Account Number:  [id number]     Admit date:  03/25/2014  Clinical Social Worker:  Renold Genta  Date/Time:  03/26/2014 11:07 AM  Referred by:  Physician  Date Referred:  03/26/2014 Referred for  Other - See comment   Other Referral:   Admitted from: [facility]   Interview type:  Family Other interview type:   patient's daughter, [name] at bedside    PSYCHOSOCIAL DATA Living Status:  FACILITY Admitted from facility:  [facility] Level of care:  Assisted Living Primary support name:  Vidhi Delellis Mons (dtr) ph#: [phone]   c#: [phone] Primary support relationship to patient:  CHILD, ADULT Degree of support available:   good    CURRENT CONCERNS Current Concerns  Post-Acute Placement   Other Concerns:    SOCIAL WORK ASSESSMENT / PLAN CSW reviewed patient's chart and noted that patient was admitted from [facility].   Assessment/plan status:  Information/Referral to [employer] Other assessment/ plan:   Information/referral to community resources:   CSW completed FL2 and sent information to ALF, confirmed with Estill Bamberg @ ALF that they would be able to take patient back once stable.    PATIENT'S/FAMILY'S RESPONSE TO PLAN OF CARE: Patient's daughter was concerned about patient returning to ALF while patient is having a reaction to the narcotic medication given to her last night.       Raynaldo Opitz, [facility] Clinical Social Worker cell #: [phone]

## 2014-03-26 NOTE — Op Note (Signed)
NAMEMAIDA, WIDGER NO.:  [id number]  MEDICAL RECORD NO.:  [id number]  LOCATION:  1503                         FACILITY:  [facility]  PHYSICIAN:  Marshall Cork. Jeffie Pollock, M.D.    DATE OF BIRTH:  03-18-1924  DATE OF PROCEDURE:  03/25/2014 DATE OF DISCHARGE:                              OPERATIVE REPORT   PROCEDURES:  First stage left ureteroscopy with holmium laser lithotripsy, insertion of left double-J stent, also cysto and left retrograde pyelogram.  PREOPERATIVE DIAGNOSIS:  A 1.5-cm left renal stone.  POSTOPERATIVE DIAGNOSIS:  A 1.5-cm left renal stone.  SURGEON:  Marshall Cork. Jeffie Pollock, M.D.  ANESTHESIA:  General.  SPECIMEN:  None.  DRAINS:  A 6-French 26-cm double-J stent.  COMPLICATIONS:  None.  INDICATIONS:  [name] is an 80 year old, white female, who has a 1.5- cm left renal stone without obstruction, but she does have intermittent pain.  The stone is radiolucent.  So, it was felt that ureteroscopy was the most appropriate option.  Because of the lack of obstruction and the radiolucency, I felt that percutaneous nephrolithotomy would be problematic.  The patient was given Cipro.  She was taken to the operating room where general anesthetic was induced.  She was placed in lithotomy position. Her perineum and genitalia were prepped with Betadine solution.  She had a small boil on the right buttock that popped and drained at the time of the procedure.  She was fitted with PAS hose, prepped with Betadine solution and draped in usual sterile fashion.  Cystoscopy was performed using the 22-French scope with 12-degree lens. Examination revealed a normal urethra.  The bladder wall was generally smooth and pale, but there were some cystitis cystica at the trigone. The ureteral orifices were unremarkable.  The left ureteral orifice was cannulated with a 5-French open-end catheter and contrast was instilled.  This revealed a normal ureter up to the level of the  kidney.  There was slight tightness at the UPJ with an extrarenal pelvis, but delicate calices.  There was an ovoid filling defect in the renal pelvis consistent with a stone.  A guidewire was then advanced through the open-end catheter to the kidney and the cystoscope was removed.  A 12/14 38-cm digital access sheath inner core was then advanced over the wire easily to the kidney. This was followed by the reassembled access sheath, which was also advanced the kidney easily.  The inner core and wire were removed and the digital flexible ureteroscope was then passed.  A 200-micron track fiber was attached to the holmium laser, which was initially set on 0.5 watts and 20 hertz and the stone was engaged.  It was a soft stone and broke up easily with varying power settings ranging from 0.5 up to 50 hertz down to a power of 1 watt with only 5 hertz.  Once the stone was felt to be fragmented as adequately as could be, readily accomplished with the laser, the laser fiber was removed and initially a Nitinol ZeroTip basket and subsequently a N-gage basket were used to attempt to remove stone fragments.  Moderate-sized fragment was engaged with the basket each time, but I was unable to get it by  the UPJ.  Once it was determined that the fragments had difficulty passing the UPJ and I could not get the access sheath easily across the UPJ, I thought it was best to place a stent and return for second stage procedure to retrieve the fragments once the UPJ had a chance to soft dilate.  The guidewire was reinserted to the kidney.  The access sheath was removed.  The cystoscope was reinserted over the wire and a 6-French 26- cm double-J stent was passed to the kidney.  Initially, I was not happy with the proximal position of the stent, so it was repositioned into an upper calix.  There was some distortion of the pelvis possibly from hemorrhage and stone fragments at the end of the case, but the  stent appeared to be in good position once the wire was removed.  The bladder was then drained.  The patient was taken down from lithotomy position and moved to the recovery room in stable condition.  She will be kept overnight for observation and will require a second procedure in 2 or 3 weeks.     Marshall Cork. Jeffie Pollock, M.D.     JJW/MEDQ  D:  03/25/2014  T:  03/26/2014  Job:  [id number]

## 2014-03-26 NOTE — Progress Notes (Signed)
Pt very drowsy this AM, awakens on calling. Per pt dtr pt is more confused/disoriented than her baseline since waking this AM. Pt requires persistent, frequent coaxing to take meds, is incontinent, becomes agitated and yells with care.  Per dtr pt at baseline is minmally verbal but calm,  feeds hersel,f and is able to verbalize need to void.  LCSW, Claiborne Billings, spoke w/ pt dtr who believes pt is not safe for d/c home to her assisted living facility.  Dr Jeffie Pollock aware and d/c cancelled for today.  Will monitor pt today.

## 2014-03-26 NOTE — Discharge Summary (Signed)
Physician Discharge Summary  Patient ID: [name] [name] MRN: [id number] DOB/AGE: 78-Mar-1925 78 y.o.  Admit date: 03/25/2014 Discharge date: 03/26/2014  Admission Diagnosis Renal stone Discharge Diagnoses:  Principal Problem:   Renal stone   Past Medical History  Diagnosis Date  . ASCVD (arteriosclerotic cardiovascular disease) 2003    Single vessel disease-60% D1  . Hypertension   . Hyperlipidemia   . Leukopenia     and anemia-hematology evaluation was nodiagnostic  . Degenerative joint disease     right TKA in 4/06  . Upper GI bleed     gastric stress ulcer in 2003  . Retinal artery branch occlusion of right eye     Sudden onset of right eye blindness-embolism suspected  . Gastroesophageal reflux disease with hiatal hernia   . Osteoporosis   . Lung nodule     Left lower lobe  . Parkinson's disease     PRESUMED BECAUSE SHE HAS A LOT OF SHAKING - BUT HAS SEEN NEUROLOGIST  AND WAS PUT ON MYSOLINE AND FAMILY HAS SEEN BIG IMPROVEMENT IN THE SHAKING  . Cervical spondylarthritis     S/p discectomy  . Diverticulosis   . Hiatal hernia   . Leukocytosis   . Hyperlipidemia   . Degenerative joint disease   . GI bleed   . Cervical spondylarthritis   . Diverticulosis   . Shortness of breath     AND WHEEZING WITH ACTIVITY AND AT REST  . Coronary artery disease   . 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 Diabetes mellitus     No insulin- NO MEDS - LOST WEIGHT - NO LONGER HAS ELEVATED SUGARS  . History of nephrolithiasis   . Dementia     PT RESIDES AT [facility] [place] [facility] - ASSISTED LIVING - SHE TALKS SOME - DOES NOT AMBULATE- TRANSFERS TO W/C WITH 2 ASSISTANTS  . Alzheimer disease   . Senile dementia   . Alzheimer's disease     Surgeries: Procedure(s): CYSTOSCOPY WITH  LEFT URETEROSCOPY AND LEFT RETROGRADE  STENT PLACEMENT HOLMIUM LASER APPLICATION on 01/21/8249   Consultants (if any):    Discharged Condition:  Improved  Hospital Course: [name] [name] is an 80 y.o. female who was admitted 03/25/2014 with a diagnosis of Renal stone and went to the operating room on 03/25/2014 and underwent the above named procedures.  I was able to fragment the stone completely but was unable to retrieve and fragments and have left a stent.  She will return for a second look in 2-3 weeks after the ureter has an opportuninty to dilate.   She has had a headache per the daughter but is poorly communicative with her dementia.   On exam she has some abdominal tenderness but she is afebrile.   She has voided multiple times.    She was given perioperative antibiotics:      Anti-infectives   Start     Dose/Rate Route Frequency Ordered Stop   03/25/14 1500  ciprofloxacin (CIPRO) tablet 250 mg     250 mg Oral 2 times daily 03/25/14 1317     03/25/14 0708  ciprofloxacin (CIPRO) IVPB 400 mg     400 mg 200 mL/hr over 60 Minutes Intravenous 60 min pre-op 03/25/14 0708 03/25/14 0910    .  She was given sequential compression devices for DVT prophylaxis.  She benefited maximally from the hospital stay and there were no complications.  Recent vital signs:  Filed Vitals:   03/26/14 0540  BP: 113/49  Pulse: 77  Temp: 97.8 F (36.6 C)  Resp: 16    Recent laboratory studies:  Lab Results  Component Value Date   HGB 12.5 03/21/2014   HGB 11.7* 01/12/2014   HGB 12.6 12/25/2013   Lab Results  Component Value Date   WBC 7.7 03/21/2014   PLT 232 03/21/2014   No results found for this basename: INR   Lab Results  Component Value Date   NA 141 03/21/2014   K 4.8 03/21/2014   CL 105 03/21/2014   CO2 26 03/21/2014   BUN 26* 03/21/2014   CREATININE 1.15* 03/21/2014   GLUCOSE 105* 03/21/2014    Discharge Medications:     Medication List         acetaminophen 500 MG tablet  Commonly known as:  TYLENOL  Take 500 mg by mouth 2 (two) times daily.     acetaminophen 500 MG tablet  Commonly known as:  TYLENOL  Take 1,000 mg by mouth  every 6 (six) hours as needed for mild pain or moderate pain.     aspirin EC 81 MG tablet  Take 81 mg by mouth daily.     B-complex with vitamin C tablet  Take 1 tablet by mouth daily.     bacitracin-polymyxin b ophthalmic ointment  Commonly known as:  POLYSPORIN  Place 1 application into both eyes at bedtime. apply to eye every 12 hours while awake     citalopram 20 MG tablet  Commonly known as:  CELEXA  Take 20 mg by mouth every morning.     clotrimazole-betamethasone cream  Commonly known as:  LOTRISONE  Apply 1 application topically 2 (two) times daily.     donepezil 5 MG tablet  Commonly known as:  ARICEPT  Take 5 mg by mouth every morning.     DUREZOL 0.05 % Emul  Generic drug:  Difluprednate  Place 1 drop into the right eye at bedtime.     hydroxypropyl methylcellulose 2.5 % ophthalmic solution  Commonly known as:  ISOPTO TEARS  Place 1 drop into both eyes 3 (three) times daily as needed for dry eyes.     loratadine 10 MG tablet  Commonly known as:  CLARITIN  Take 10 mg by mouth daily.     memantine 10 MG tablet  Commonly known as:  NAMENDA  Take 10 mg by mouth 2 (two) times daily.     mometasone 0.1 % cream  Commonly known as:  ELOCON  Apply 1 application topically 2 (two) times daily.     olmesartan 20 MG tablet  Commonly known as:  BENICAR  Take 20 mg by mouth every morning.     omeprazole 40 MG capsule  Commonly known as:  PRILOSEC  Take 40 mg by mouth 2 (two) times daily.     ondansetron 4 MG disintegrating tablet  Commonly known as:  ZOFRAN-ODT  Take 4 mg by mouth every 6 (six) hours as needed for nausea or vomiting.     polyethylene glycol packet  Commonly known as:  MIRALAX / GLYCOLAX  Take 17 g by mouth daily. *Mixed with 8 ounces of water/juice daily*     primidone 50 MG tablet  Commonly known as:  MYSOLINE  Take 25 mg by mouth at bedtime.     sucralfate 1 G tablet  Commonly known as:  CARAFATE  Take 2 g by mouth at bedtime.  traMADol 50 MG tablet  Commonly known as:  ULTRAM  Take 1 tablet (50 mg total) by mouth every 6 (six) hours as needed for moderate pain.     vitamin E 400 UNIT capsule  Take 400 Units by mouth daily.        Diagnostic Studies: Ct Abdomen Pelvis Wo Contrast  03/04/2014   CLINICAL DATA:  Bilateral kidney stones.  Diabetes.  Dementia.  EXAM: CT ABDOMEN AND PELVIS WITHOUT CONTRAST  TECHNIQUE: Multidetector CT imaging of the abdomen and pelvis was performed following the standard protocol without intravenous contrast.  COMPARISON:  DG ABDOMEN 1V dated 02/19/2014; CT ABD - PELV W/ CM dated 10/29/2013; CT ABD/PELVIS W CM dated 09/21/2011; CT L SPINE W/O CM dated 02/06/2010  FINDINGS: Lower Chest: Mild motion degradation at the lung bases. Grossly clear. Normal heart size without pericardial or pleural effusion. Multivessel coronary artery atherosclerosis. No pericardial or pleural effusion. A moderate hiatal hernia.  Abdomen/Pelvis: Mild hepatic steatosis. Motion degradation continuing into the abdomen. Normal noncontrast appearance of the spleen, distal stomach. Pancreatic fatty atrophy.  Cholecystectomy, without biliary ductal dilatation. Normal adrenal glands. Bilateral renal atrophy, greater on the left.  A stone at the left ureteral pelvic junction measures 1.2 cm and is minimally enlarged. Similar in position to on the prior. No significant obstruction. Punctate right renal collecting system calculus. Probable lower pole left renal collecting system punctate calculus.  A multi septated, partially calcified lower pole right renal mass is again identified. Similar in size measuring 4.4 x 4.2 cm on image 33. No hydroureter or ureteric calculi.  Aortic and branch vessel atherosclerosis. No retroperitoneal or retrocrural adenopathy. Scattered colonic diverticula. Normal terminal ileum. Appendix likely identified and normal. No right lower quadrant inflammation. Normal small bowel without abdominal ascites.  No  pelvic adenopathy. Pelvic floor laxity. Hysterectomy. Otherwise normal urinary bladder, without adnexal mass or significant free pelvic fluid.  Bones/Musculoskeletal: Osteopenia. Remote left rib trauma. A moderate L1 compression deformity is similar. Mild ventral canal encroachment.  IMPRESSION: 1. Motion degradation throughout. 2. Enlargement of a left ureteropelvic junction calculus, without urinary tract obstruction. 3. Bilateral nephrolithiasis. 4. Similar size of a multi septated cystic lesion within the lower pole right kidney. This is technically indeterminate, and could represent a complex cyst, area of of localized cystic disease, or even a cystic neoplasm. However, given presence back to 09/21/2011, and patient age/comorbidities, is of doubtful clinical significance. 5. Osteopenia with chronic L1 compression deformity. 6. A moderate hiatal hernia.   Electronically Signed   By: Abigail Miyamoto M.D.   On: 03/04/2014 09:08    Disposition: 01-Home or Self Care  Discharge Orders   Future Appointments Provider Department Dept Phone   04/11/2014 10:30 AM Kathyrn Drown, MD [place] [phone]   05/16/2014 12:00 PM Star Age, MD [facility] [phone]   07/01/2014 2:00 PM Rogene Houston, MD [place] [phone]   01/30/2015 9:15 AM Hayden Pedro, MD [facility] [phone]   Future Orders Complete By Expires   Discontinue IV  As directed       Follow-up Information   Follow up with Malka So, MD. (Office will call to arrange next procedure. )    Specialty:  Urology   Contact information:   [facility]  [place] [address] [phone]       Follow up with Malka So, MD On 04/04/2014. (F/u in [place] office as scheduled.)  Specialty:  Urology   Contact information:   [facility]  [place]  [address] [phone]        Signed: Irine Seal 03/26/2014, 6:39 AM

## 2014-03-27 ENCOUNTER — Ambulatory Visit (HOSPITAL_COMMUNITY): Payer: Medicare Other

## 2014-03-27 ENCOUNTER — Encounter (HOSPITAL_COMMUNITY): Payer: Self-pay | Admitting: Urology

## 2014-03-27 DIAGNOSIS — G9340 Encephalopathy, unspecified: Secondary | ICD-10-CM | POA: Diagnosis present

## 2014-03-27 DIAGNOSIS — R509 Fever, unspecified: Secondary | ICD-10-CM | POA: Diagnosis not present

## 2014-03-27 LAB — CBC WITH DIFFERENTIAL/PLATELET
BASOS PCT: 0 % (ref 0–1)
Basophils Absolute: 0 10*3/uL (ref 0.0–0.1)
EOS PCT: 1 % (ref 0–5)
Eosinophils Absolute: 0.1 10*3/uL (ref 0.0–0.7)
HCT: 35.3 % — ABNORMAL LOW (ref 36.0–46.0)
HEMOGLOBIN: 11.7 g/dL — AB (ref 12.0–15.0)
LYMPHS ABS: 2.1 10*3/uL (ref 0.7–4.0)
Lymphocytes Relative: 25 % (ref 12–46)
MCH: 33.1 pg (ref 26.0–34.0)
MCHC: 33.1 g/dL (ref 30.0–36.0)
MCV: 100 fL (ref 78.0–100.0)
MONO ABS: 1.3 10*3/uL — AB (ref 0.1–1.0)
Monocytes Relative: 15 % — ABNORMAL HIGH (ref 3–12)
NEUTROS PCT: 59 % (ref 43–77)
Neutro Abs: 4.9 10*3/uL (ref 1.7–7.7)
PLATELETS: UNDETERMINED 10*3/uL (ref 150–400)
RBC: 3.53 MIL/uL — ABNORMAL LOW (ref 3.87–5.11)
RDW: 13.6 % (ref 11.5–15.5)
WBC: 8.4 10*3/uL (ref 4.0–10.5)

## 2014-03-27 LAB — URINALYSIS, ROUTINE W REFLEX MICROSCOPIC
Bilirubin Urine: NEGATIVE
Glucose, UA: NEGATIVE mg/dL
Ketones, ur: NEGATIVE mg/dL
Nitrite: NEGATIVE
PROTEIN: 100 mg/dL — AB
SPECIFIC GRAVITY, URINE: 1.009 (ref 1.005–1.030)
Urobilinogen, UA: 1 mg/dL (ref 0.0–1.0)
pH: 7 (ref 5.0–8.0)

## 2014-03-27 LAB — BASIC METABOLIC PANEL
BUN: 11 mg/dL (ref 6–23)
CALCIUM: 8.5 mg/dL (ref 8.4–10.5)
CO2: 21 meq/L (ref 19–32)
CREATININE: 1.06 mg/dL (ref 0.50–1.10)
Chloride: 100 mEq/L (ref 96–112)
GFR calc Af Amer: 53 mL/min — ABNORMAL LOW (ref >90)
GFR calc non Af Amer: 46 mL/min — ABNORMAL LOW (ref >90)
GLUCOSE: 199 mg/dL — AB (ref 70–99)
Potassium: 4.7 mEq/L (ref 3.7–5.3)
Sodium: 134 mEq/L — ABNORMAL LOW (ref 137–147)

## 2014-03-27 LAB — URINE MICROSCOPIC-ADD ON

## 2014-03-27 MED ORDER — PIPERACILLIN-TAZOBACTAM 3.375 G IVPB
3.3750 g | Freq: Three times a day (TID) | INTRAVENOUS | Status: DC
  Administered 2014-03-27 – 2014-03-28 (×3): 3.375 g via INTRAVENOUS
  Filled 2014-03-27 (×4): qty 50

## 2014-03-27 MED ORDER — HALOPERIDOL LACTATE 5 MG/ML IJ SOLN
2.0000 mg | INTRAMUSCULAR | Status: DC | PRN
  Administered 2014-03-27: 2 mg via INTRAVENOUS
  Filled 2014-03-27: qty 1

## 2014-03-27 MED ORDER — ACETAMINOPHEN 60 MG HALF SUPP
60.0000 mg | RECTAL | Status: DC | PRN
  Administered 2014-03-27: 60 mg via RECTAL
  Filled 2014-03-27: qty 1

## 2014-03-27 MED ORDER — OLANZAPINE 5 MG PO TBDP
5.0000 mg | ORAL_TABLET | Freq: Every day | ORAL | Status: DC
  Administered 2014-03-27: 5 mg via ORAL
  Filled 2014-03-27 (×2): qty 1

## 2014-03-27 MED ORDER — DEXTROSE-NACL 5-0.9 % IV SOLN
INTRAVENOUS | Status: DC
  Administered 2014-03-27 – 2014-03-28 (×3): via INTRAVENOUS

## 2014-03-27 MED ORDER — ACETAMINOPHEN 120 MG RE SUPP
40.0000 mg | RECTAL | Status: DC | PRN
  Filled 2014-03-27: qty 1

## 2014-03-27 NOTE — Progress Notes (Signed)
ANTIBIOTIC CONSULT NOTE - INITIAL  Pharmacy Consult for zosyn Indication: fever s/p L ureteroscopy and stent placement on Ciprofloxacin  Allergies  Allergen Reactions  . Codeine Other (See Comments)    REACTION: Unknown  . Morphine Nausea And Vomiting  . Other     NO CITRUS, TOMATO BASED PRODUCTS OR CHOCOLATE - BECAUSE OF REFLUX PT'S FAMILY STATES THAT ANY TIME PT IS GIVEN NARCOTICS SHE HALLUCINATES AND TRIES TO GET UP OUT W/C OR BED Medications have to be crushed and put in apple sauce.    Patient Measurements: Height: 5\' 1"  (154.9 cm) Weight: 185 lb 13.6 oz (84.3 kg) (bed scale) IBW/kg (Calculated) : 47.8 Adjusted Body Weight:   Vital Signs: Temp: 98.4 F (36.9 C) (05/07 0500) Temp src: Oral (05/07 0500) BP: 137/74 mmHg (05/07 0500) Pulse Rate: 105 (05/07 0500) Intake/Output from previous day: 05/06 0701 - 05/07 0700 In: 2130 [P.O.:330; I.V.:1800] Out: -  Intake/Output from this shift:    Labs: No results found for this basename: WBC, HGB, PLT, LABCREA, CREATININE,  in the last 72 hours Estimated Creatinine Clearance: 34 ml/min (by C-G formula based on Cr of 1.15). No results found for this basename: VANCOTROUGH, Corlis Leak, VANCORANDOM, Canadian, GENTPEAK, GENTRANDOM, TOBRATROUGH, TOBRAPEAK, TOBRARND, AMIKACINPEAK, AMIKACINTROU, AMIKACIN,  in the last 72 hours   Microbiology: Recent Results (from the past 720 hour(s))  MRSA PCR SCREENING     Status: None   Collection Time    03/25/14  8:55 PM      Result Value Ref Range Status   MRSA by PCR NEGATIVE  NEGATIVE Final   Comment:            The GeneXpert MRSA Assay (FDA     approved for NASAL specimens     only), is one component of a     comprehensive MRSA colonization     surveillance program. It is not     intended to diagnose MRSA     infection nor to guide or     monitor treatment for     MRSA infections.    Medical History: Past Medical History  Diagnosis Date  . ASCVD (arteriosclerotic  cardiovascular disease) 2003    Single vessel disease-60% D1  . Hypertension   . Hyperlipidemia   . Leukopenia     and anemia-hematology evaluation was nodiagnostic  . Degenerative joint disease     right TKA in 4/06  . Upper GI bleed     gastric stress ulcer in 2003  . Retinal artery branch occlusion of right eye     Sudden onset of right eye blindness-embolism suspected  . Gastroesophageal reflux disease with hiatal hernia   . Osteoporosis   . Lung nodule     Left lower lobe  . Parkinson's disease     PRESUMED BECAUSE SHE HAS A LOT OF SHAKING - BUT HAS SEEN NEUROLOGIST  AND WAS PUT ON MYSOLINE AND FAMILY HAS SEEN BIG IMPROVEMENT IN THE SHAKING  . Cervical spondylarthritis     S/p discectomy  . Diverticulosis   . Hiatal hernia   . Leukocytosis   . Hyperlipidemia   . Degenerative joint disease   . GI bleed   . Cervical spondylarthritis   . Diverticulosis   . Shortness of breath     AND WHEEZING WITH ACTIVITY AND AT REST  . Coronary artery disease   . 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 Diabetes mellitus     No  insulin- NO MEDS - LOST WEIGHT - NO LONGER HAS ELEVATED SUGARS  . History of nephrolithiasis   . Dementia     PT RESIDES AT [facility] [place] [facility] - ASSISTED LIVING - SHE TALKS SOME - DOES NOT AMBULATE- TRANSFERS TO W/C WITH 2 ASSISTANTS  . Alzheimer disease   . Senile dementia   . Alzheimer's disease     Assessment: 54 YOF with renal stone s/p double J stent placement 5/6, post-operatively patient having low-grade fever despite being on oral cipro. Orders for pharmacy to dose zosyn.  5/5 >>ciprofloxacin  >> 5/7 5/7 >> zosyn  >>    Tmax: 100.4 WBCs: on 5/1: 7.7 Renal: on 5/1: Scr = 1.15 for est CrCl = 17ml/min  5/7 blood: 5/7 urine:   Goal of Therapy:  Appropriately dose for renal function  Plan:   Zosyn 3.375gm IV q8h over 4h infusion  Pharmacy will follow at distance and write notes only when dose  adjustment necessary  Doreene Eland, PharmD, BCPS.   Pager: [phone]  03/27/2014,8:14 AM

## 2014-03-27 NOTE — Progress Notes (Signed)
CSW met with patient's daughter, Lottie Mussel (ph#: [phone]) at bedside re: discharge planning - daughter informed CSW that patient's baseline was wheelchair bound, requiring assistance with ADLs. Patient has been a resident of [facility] for the past 3 years and family would prefer that she return there rather than go to a SNF due to confusion & familiarity with staff at [facility].   CSW will check back in the morning.   *Dr. Jeffie Pollock: please sign FL2 on shadow chart in Yaakov Guthrie, [place] Worker cell #: [phone]

## 2014-03-27 NOTE — Consult Note (Addendum)
[facility] Medical Consultation  AZARIAH LATENDRESSE HQI:[id number] DOB: 1924/01/05 DOA: 03/25/2014 PCP: Sallee Lange, MD   Requesting physician: Irine Seal Date of consultation: 03/27/14 Reason for consultation: altered mental status and fever  Impression/Recommendations Principal Problem:   Fever, unspecified - UA pending - CXR negative - possibly due to atelectasis - mild wheezing may be significant for aspiration event- Zosyn (ordered by Dr Jeffie Pollock) is appropriate- can likely be downgraded to Unasyn as it is doubtful that she has pseudomonas  Active Problems:    Acute encephalopathy in severe dementia - due to fever vs occult infection for hospital related delirium - start PRN Haldon (will need telemetry monitoring for this) and QHS Zyprexa - add furter antipsychotics if needed- would avoid benzodiazepines - as she will go from here to a SNF, the confusion will likely remain if it is it due to new surroundings and she may required long term antipsychotics to control it  Anorexia - no longer eating since confused  Hyponatremia - change D5 1/2 NS to D5NS and increase rate to 100 as she is not drinking.     Renal stone - per nephrology  CKD 3 - stable  Our group will followup again tomorrow. Please contact us if we can be of assistance in the meanwhile. Thank you for this consultation.  Chief Complaint: consulted for AMS and fever  HPI:  80 y/o female with dementia, HTN, and "boderline DM" who was admitted to [facility] for overnight observation after stenting of left ureter and lithotripsy for left sided hydronephrosis. This was performed yesterday. Today she was noted to have a fever and began to [employer]. Her daughter is at the bedside and tells me that this is not normal for her mother and although at baseline she is confused, there is a clear change in her mental status. She had noticed this type of change in the past when she has been hospitalized.   Review of  Systems:  Unable to obtain  Past Medical History  Diagnosis Date  . ASCVD (arteriosclerotic cardiovascular disease) 2003    Single vessel disease-60% D1  . Hypertension   . Hyperlipidemia   . Leukopenia     and anemia-hematology evaluation was nodiagnostic  . Degenerative joint disease     right TKA in 4/06  . Upper GI bleed     gastric stress ulcer in 2003  . Retinal artery branch occlusion of right eye     Sudden onset of right eye blindness-embolism suspected  . Gastroesophageal reflux disease with hiatal hernia   . Osteoporosis   . Lung nodule     Left lower lobe  . Parkinson's disease     PRESUMED BECAUSE SHE HAS A LOT OF SHAKING - BUT HAS SEEN NEUROLOGIST  AND WAS PUT ON MYSOLINE AND FAMILY HAS SEEN BIG IMPROVEMENT IN THE SHAKING  . Cervical spondylarthritis     S/p discectomy  . Diverticulosis   . Hiatal hernia   . Leukocytosis   . Hyperlipidemia   . Degenerative joint disease   . GI bleed   . Cervical spondylarthritis   . Diverticulosis   . Shortness of breath     AND WHEEZING WITH ACTIVITY AND AT REST  . Coronary artery disease   . 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 Diabetes mellitus     No insulin- NO MEDS - LOST WEIGHT - NO LONGER HAS ELEVATED SUGARS  . History of nephrolithiasis   .  Dementia     PT RESIDES AT [facility] [place] [facility]IST[facility]IV[facility] - SHE TALKS SOME - DOES NOT AMBULATE- TRANSFERS TO W/C WITH 2 ASSISTANTS  . Alzheimer disease   . Senile dementia   . Alzheimer's disease    Past Surgical History  Procedure Laterality Date  . Total abdominal hysterectomy    . Cervical discectomy      Dr. Sherwood Gambler  . Cholecystectomy    . Total knee arthroplasty  2006    Right; Dr. Percell Miller  . Colonoscopy  2009  . Joint replacement    . Cystoscopy w/ ureteral stent placement  09/23/2011    Procedure: CYSTOSCOPY WITH RETROGRADE PYELOGRAM/URETERAL STENT PLACEMENT;  Surgeon: Marissa Nestle;  Location: [facility] ORS;   Service: Urology;  Laterality: Right;  . Esophagogastroduodenoscopy N/A 10/31/2013    Procedure: ESOPHAGOGASTRODUODENOSCOPY (EGD);  Surgeon: Rogene Houston, MD;  Location: [facility] ENDO SUITE;  Service: Endoscopy;  Laterality: N/A;  . Vascular surgery      LASER OF RT EYE AFTER EYE STROKE  . Cystoscopy/retrograde/ureteroscopy Left 03/25/2014    Procedure: CYSTOSCOPY WITH  LEFT URETEROSCOPY AND LEFT RETROGRADE  STENT PLACEMENT;  Surgeon: Irine Seal, MD;  Location: [facility] ORS;  Service: Urology;  Laterality: Left;  . Holmium laser application Left [id number]    Procedure: HOLMIUM LASER APPLICATION;  Surgeon: Irine Seal, MD;  Location: [facility] ORS;  Service: Urology;  Laterality: Left;   Social History:  reports that she has never smoked. She has never used smokeless tobacco. She reports that she does not drink alcohol or use illicit drugs.  Allergies  Allergen Reactions  . Codeine Other (See Comments)    REACTION: Unknown  . Morphine Nausea And Vomiting  . Other     NO CITRUS, TOMATO BASED PRODUCTS OR CHOCOLATE - BECAUSE OF REFLUX PT'S FAMILY STATES THAT ANY TIME PT IS GIVEN NARCOTICS SHE HALLUCINATES AND TRIES TO GET UP OUT W/C OR BED Medications have to be crushed and put in apple sauce.   Family History  Problem Relation Age of Onset  . Diabetes Sister   . Heart disease Sister   . Diabetes Brother     Prior to Admission medications   Medication Sig Start Date End Date Taking? Authorizing Provider  acetaminophen (TYLENOL) 500 MG tablet Take 500 mg by mouth 2 (two) times daily.   Yes Historical Provider, MD  aspirin EC 81 MG tablet Take 81 mg by mouth daily.   Yes Historical Provider, MD  B Complex-C (B-COMPLEX WITH VITAMIN C) tablet Take 1 tablet by mouth daily.   Yes Historical Provider, MD  bacitracin-polymyxin b (POLYSPORIN) ophthalmic ointment Place 1 application into both eyes at bedtime. apply to eye every 12 hours while awake   Yes Historical Provider, MD  citalopram (CELEXA) 20 MG tablet  Take 20 mg by mouth every morning.    Yes Historical Provider, MD  Difluprednate (DUREZOL) 0.05 % EMUL Place 1 drop into the right eye at bedtime.    Yes Historical Provider, MD  donepezil (ARICEPT) 5 MG tablet Take 5 mg by mouth every morning.   Yes Historical Provider, MD  loratadine (CLARITIN) 10 MG tablet Take 10 mg by mouth daily.   Yes Historical Provider, MD  memantine (NAMENDA) 10 MG tablet Take 10 mg by mouth 2 (two) times daily.   Yes Historical Provider, MD  mometasone (ELOCON) 0.1 % cream Apply 1 application topically 2 (two) times daily.   Yes Historical Provider, MD  olmesartan (BENICAR) 20 MG tablet Take 20 mg by mouth every morning.   Yes Historical Provider, MD  omeprazole (PRILOSEC) 40 MG capsule Take 40 mg by mouth 2 (two) times daily.   Yes Historical Provider, MD  primidone (MYSOLINE) 50 MG tablet Take 25 mg by mouth at bedtime.   Yes Historical Provider, MD  vitamin E 400 UNIT capsule Take 400 Units by mouth daily.   Yes Historical Provider, MD  acetaminophen (TYLENOL) 500 MG tablet Take 1,000 mg by mouth every 6 (six) hours as needed for mild pain or moderate pain.    Historical Provider, MD  ciprofloxacin (CIPRO) 250 MG tablet Take 1 tablet (250 mg total) by mouth 2 (two) times daily. 03/26/14   Irine Seal, MD  clotrimazole-betamethasone (LOTRISONE) cream Apply 1 application topically 2 (two) times daily.    Historical Provider, MD  hydroxypropyl methylcellulose (ISOPTO TEARS) 2.5 % ophthalmic solution Place 1 drop into both eyes 3 (three) times daily as needed for dry eyes.    Historical Provider, MD  ondansetron (ZOFRAN-ODT) 4 MG disintegrating tablet Take 4 mg by mouth every 6 (six) hours as needed for nausea or vomiting.    Historical Provider, MD  polyethylene glycol (MIRALAX / GLYCOLAX) packet Take 17 g by mouth daily. *Mixed with 8 ounces of water/juice daily*    Historical Provider, MD  sucralfate (CARAFATE) 1 G tablet Take 2 g by mouth at bedtime.    Historical  Provider, MD  traMADol (ULTRAM) 50 MG tablet Take 1 tablet (50 mg total) by mouth every 6 (six) hours as needed for moderate pain. 03/26/14   Irine Seal, MD   Physical Exam: Blood pressure 110/74, pulse 95, temperature 99.3 F (37.4 C), temperature source Axillary, resp. rate 20, height 5\' 1"  (1.549 m), weight 84.3 kg (185 lb 13.6 oz), SpO2 97.00%. Filed Vitals:   03/27/14 1340  BP: 110/74  Pulse: 95  Temp: 99.3 F (37.4 C)  Resp: 20     General:  Confused and restless elderly female intermittently moaning and crying   Cardiovascular: RRR, no murmurs  Respiratory: mild upper air way wheeze- no crackles or rhonchi  Abdomen: soft, BS+, difficult to tell if she is tender as she is constantly moaning when she is touched anywhere on her body.   Skin: warm dry, no rash  Musculoskeletal: as mentioned above moans when she is touched  Psychiatric: confused, agitated  Neurologic: difficult to assess but cranial nerves appear intact- speech is understandable (when she is not agitated she will answer some questions) and not slurred- not following commands or moving extremities.   Labs on Admission:  Basic Metabolic Panel:  Recent Labs Lab 03/21/14 1145 03/27/14 0847  NA 141 134*  K 4.8 4.7  CL 105 100  CO2 26 21  GLUCOSE 105* 199*  BUN 26* 11  CREATININE 1.15* 1.06  CALCIUM 9.4 8.5   Liver Function Tests: No results found for this basename: AST, ALT, ALKPHOS, BILITOT, PROT, ALBUMIN,  in the last 168 hours No results found for this basename: LIPASE, AMYLASE,  in the last 168 hours No results found for this basename: AMMONIA,  in the last 168 hours CBC:  Recent Labs Lab 03/21/14 1145 03/27/14 0847  WBC 7.7 8.4  NEUTROABS  --  4.9  HGB 12.5 11.7*  HCT 40.0 35.3*  MCV 101.5* 100.0  PLT 232 PLATELET CLUMPS NOTED ON SMEAR, UNABLE TO ESTIMATE   Cardiac Enzymes: No results found for this basename: CKTOTAL, CKMB, CKMBINDEX, TROPONINI,  in the last 168 hours BNP: No  components found with this basename: POCBNP,  CBG:  Recent Labs Lab 03/25/14 0730 03/25/14 1157  GLUCAP 125* 117*    Radiological Exams on Admission: Dg Chest Port 1 View  03/27/2014   CLINICAL DATA:  Preoperative examination (cystoscopy with left-sided ureteroscopy and left-sided retrograde stent placement)  EXAM: PORTABLE CHEST - 1 VIEW  COMPARISON:  Chest radiograph - 01/12/2014; chest CT -01/17/2014  FINDINGS: Grossly unchanged borderline enlarged cardiac silhouette and mediastinal contours with atherosclerotic plaque within the thoracic aorta. The lung volumes remain reduced with mild elevation of the right hemidiaphragms. Grossly unchanged retrocardiac opacity compatible with known hiatal hernia. Minimal bibasilar heterogeneous opacities, left greater than right, likely atelectasis. No new focal airspace opacities. No pleural effusion or pneumothorax. No evidence of edema. No acute osseus abnormalities.  IMPRESSION: 1. Hypoventilated lungs without acute cardiopulmonary disease. 2. Hiatal hernia.   Electronically Signed   By: Sandi Mariscal M.D.   On: 03/27/2014 08:41    EKG: Independently reviewed. SR with 1st degree AVB  Time spent: > 45 min  Debbe Odea, MD [facility]  If 7PM-7AM, please contact night-coverage [email or web address] Password [facility] 03/27/2014, 4:56 PM

## 2014-03-27 NOTE — Progress Notes (Signed)
Patient ID: [name] [name], female   DOB: [birth year], 80 y.o.   MRN: [id number] 2 Days Post-Op  Subjective: [name] [name] is more lively this morning and no longer acts like she has a headache but she had a fever to 101.5 and a pulse of 105 overnight.  She is POD #2 from a ureteroscopy with laser and stent for a left ureteral stone.   She slept about 2 hours last night.  ROS:  Review of Systems  Unable to perform ROS: dementia    Anti-infectives: Anti-infectives   Start     Dose/Rate Route Frequency Ordered Stop   03/26/14 0000  ciprofloxacin (CIPRO) 250 MG tablet     250 mg Oral 2 times daily 03/26/14 0643     03/25/14 1500  ciprofloxacin (CIPRO) tablet 250 mg  Status:  Discontinued     250 mg Oral 2 times daily 03/25/14 1317 03/27/14 0804   03/25/14 0708  ciprofloxacin (CIPRO) IVPB 400 mg     400 mg 200 mL/hr over 60 Minutes Intravenous 60 min pre-op 03/25/14 0708 03/25/14 0910      Current Facility-Administered Medications  Medication Dose Route Frequency Provider Last Rate Last Dose  . acetaminophen (TYLENOL) suppository 60 mg  60 mg Rectal Q4H PRN Irine Seal, MD   60 mg at 03/27/14 [phone]  . acetaminophen (TYLENOL) tablet 650 mg  650 mg Oral Q4H PRN Irine Seal, MD   650 mg at 03/27/14 0022  . bacitracin-polymyxin b (POLYSPORIN) ophthalmic ointment 1 application  1 application Both Eyes QHS Irine Seal, MD   1 application at 93/81/82 2250  . citalopram (CELEXA) tablet 20 mg  20 mg Oral q morning - 10a Irine Seal, MD   20 mg at 03/26/14 1040  . clotrimazole (LOTRIMIN) 1 % cream   Topical BID Irine Seal, MD      . dextrose 5 % and 0.45 % NaCl with KCl 20 mEq/L infusion   Intravenous Continuous Irine Seal, MD 75 mL/hr at 03/27/14 0431    . Difluprednate 0.05 % EMUL 1 drop  1 drop Right Eye QHS Irine Seal, MD   1 drop at 03/26/14 2250  . diphenhydrAMINE (BENADRYL) injection 12.5 mg  12.5 mg Intravenous Q6H PRN Irine Seal, MD       Or  . diphenhydrAMINE (BENADRYL) 12.5 MG/5ML elixir 12.5 mg   12.5 mg Oral Q6H PRN Irine Seal, MD      . donepezil (ARICEPT) tablet 5 mg  5 mg Oral q morning - 10a Irine Seal, MD   5 mg at 03/26/14 1036  . HYDROmorphone (DILAUDID) injection 0.5-1 mg  0.5-1 mg Intravenous Q2H PRN Irine Seal, MD   1 mg at 03/26/14 0049  . hyoscyamine (LEVSIN SL) SL tablet 0.125 mg  0.125 mg Oral Q4H PRN Irine Seal, MD      . irbesartan (AVAPRO) tablet 150 mg  150 mg Oral Daily Irine Seal, MD   150 mg at 03/26/14 1050  . loratadine (CLARITIN) tablet 10 mg  10 mg Oral Daily Irine Seal, MD   10 mg at 03/26/14 1040  . memantine (NAMENDA) tablet 10 mg  10 mg Oral BID Irine Seal, MD   10 mg at 03/26/14 2249  . ondansetron (ZOFRAN) injection 4 mg  4 mg Intravenous Q4H PRN Irine Seal, MD      . pantoprazole sodium (PROTONIX) 40 mg/20 mL oral suspension 80 mg  80 mg Per Tube Daily Irine Seal, MD   80 mg at 03/26/14 1219  .  polyethylene glycol (MIRALAX / GLYCOLAX) packet 17 g  17 g Oral Daily Irine Seal, MD   17 g at 03/26/14 1035  . polyvinyl alcohol (LIQUIFILM TEARS) 1.4 % ophthalmic solution 1 drop  1 drop Both Eyes TID PRN Irine Seal, MD      . primidone (MYSOLINE) tablet 25 mg  25 mg Oral QHS Irine Seal, MD   25 mg at 03/26/14 2249  . sucralfate (CARAFATE) tablet 2 g  2 g Oral QHS Irine Seal, MD   2 g at 03/26/14 2249  . traMADol (ULTRAM) tablet 50 mg  50 mg Oral Q6H PRN Irine Seal, MD   50 mg at 03/25/14 1453     Objective: Vital signs in last 24 hours: Temp:  [98.4 F (36.9 C)-101.5 F (38.6 C)] 98.4 F (36.9 C) (05/07 0500) Pulse Rate:  [70-105] 105 (05/07 0500) Resp:  [18-20] 18 (05/07 0500) BP: (123-139)/(46-80) 137/74 mmHg (05/07 0500) SpO2:  [95 %-97 %] 96 % (05/07 0500)  Intake/Output from previous day: 05/06 0701 - 05/07 0700 In: 2130 [P.O.:330; I.V.:1800] Out: -  Intake/Output this shift:     Physical Exam  Constitutional: She is well-developed, well-nourished, and in no distress.  Pulmonary/Chest: Effort normal.  Weak inspiration with decreased BS but  no wheezing.   Abdominal: Soft. Bowel sounds are normal. There is no tenderness. There is no guarding.  Musculoskeletal:  PAS hose in place.   Neurological: She is alert.  Demented and unable to answer questions    Lab Results:  No results found for this basename: WBC, HGB, HCT, PLT,  in the last 72 hours BMET No results found for this basename: NA, K, CL, CO2, GLUCOSE, BUN, CREATININE, CALCIUM,  in the last 72 hours PT/INR No results found for this basename: LABPROT, INR,  in the last 72 hours ABG No results found for this basename: PHART, PCO2, PO2, HCO3,  in the last 72 hours  Studies/Results: No results found.   Assessment: s/p Procedure(s): CYSTOSCOPY WITH  LEFT URETEROSCOPY AND LEFT RETROGRADE  STENT PLACEMENT HOLMIUM LASER APPLICATION  She now has a post op fever on Cipro but appears to be feeling better with less abdominal tenderness and headache.  Plan: I am going to stop Cipro and change to Zosyn.  I will get blood and urine cultures, CBC, BMP and port CXR.    Change to inpatient status.     LOS: 2 days    Irine Seal 03/27/2014

## 2014-03-28 ENCOUNTER — Other Ambulatory Visit: Payer: Self-pay | Admitting: Urology

## 2014-03-28 DIAGNOSIS — N200 Calculus of kidney: Principal | ICD-10-CM

## 2014-03-28 MED ORDER — PANTOPRAZOLE SODIUM 40 MG PO TBEC: 40.0000 mg | DELAYED_RELEASE_TABLET | Freq: Every day | ORAL | Status: DC

## 2014-03-28 NOTE — Progress Notes (Signed)
Writer called report to "[place]" at [facility]. Pt to be discharged back to [facility] in [place] this afternoon via non-emergency EMS.  2 daughters of pt present and aware of transfer.

## 2014-03-28 NOTE — Progress Notes (Signed)
Patient was in bed with eyes closed upon entering room.  RN was called by patient family to address abdominal sounds that were being overheard.  RN spoke to patient and family about the lack of a bowel movement since admission.  Oxygen level was taken due to expiratory wheezing, vitals were accessed.   Patient care was address and complete vitals were taken,  Filed Vitals:   03/28/14 0700  BP: 117/63  Pulse: 89  Temp: 98.8 F (37.1 C)  Resp: 18   Patient finished 75% of breakfast and consumed 240 oz of fluids.  Patient had no complaints of pain.  Encouraged patient to open left eye, to promote wakefulness.  Patient was in high fowlers and interacting with family.    Sallye Ober, SN, RCC

## 2014-03-28 NOTE — Progress Notes (Signed)
[facility] PROGRESS NOTE  [name] [name] WUJ:[id number] DOB: 1924-09-10 DOA: 03/25/2014 PCP: Sallee Lange, MD  Assessment/Plan: Fever -One isolated episode. -No source of infection (CXR, UA negative). -All cx data remains negative to date. -Feel there is no need for antibiotics at this point. -OK to DC back to ALF today from my standpoint  Code Status: Full Family Communication: Daughter at bedside updated on plan of care  Disposition Plan: Back to ALF today if ok with GU.   Consultants:  Internal Medicine   Antibiotics:  None   Subjective: No complaints. Very demented/  Objective: Filed Vitals:   03/27/14 2156 03/28/14 0600 03/28/14 0700 03/28/14 1443  BP: 136/86 136/56 117/63 110/72  Pulse: 89 79 89 80  Temp: 98.2 F (36.8 C) 98.4 F (36.9 C) 98.8 F (37.1 C) 100.6 F (38.1 C)  TempSrc: Oral Oral Axillary Rectal  Resp: 18 20 18 18   Height:      Weight:      SpO2: 95% 96% 96% 97%    Intake/Output Summary (Last 24 hours) at 03/28/14 1613 Last data filed at 03/28/14 1500  Gross per 24 hour  Intake 2996.67 ml  Output      0 ml  Net 2996.67 ml   Filed Weights   03/25/14 1350  Weight: 84.3 kg (185 lb 13.6 oz)    Exam:   General:  drowsy  Cardiovascular: RRR  Respiratory: CTA B  Abdomen: S/NT/ND/+BS  Extremities: no C/C/E   Neurologic:  Non-focal  Data Reviewed: Basic Metabolic Panel:  Recent Labs Lab 03/27/14 0847  NA 134*  K 4.7  CL 100  CO2 21  GLUCOSE 199*  BUN 11  CREATININE 1.06  CALCIUM 8.5   Liver Function Tests: No results found for this basename: AST, ALT, ALKPHOS, BILITOT, PROT, ALBUMIN,  in the last 168 hours No results found for this basename: LIPASE, AMYLASE,  in the last 168 hours No results found for this basename: AMMONIA,  in the last 168 hours CBC:  Recent Labs Lab 03/27/14 0847  WBC 8.4  NEUTROABS 4.9  HGB 11.7*  HCT 35.3*  MCV 100.0  PLT PLATELET CLUMPS NOTED ON SMEAR, UNABLE TO  ESTIMATE   Cardiac Enzymes: No results found for this basename: CKTOTAL, CKMB, CKMBINDEX, TROPONINI,  in the last 168 hours BNP (last 3 results)  Recent Labs  11/22/13 0923 12/25/13 1003 01/10/14 1157  PROBNP 427.6 570.7* 67.0   CBG:  Recent Labs Lab 03/25/14 0730 03/25/14 1157  GLUCAP 125* 117*    Recent Results (from the past 240 hour(s))  MRSA PCR SCREENING     Status: None   Collection Time    03/25/14  8:55 PM      Result Value Ref Range Status   MRSA by PCR NEGATIVE  NEGATIVE Final   Comment:            The GeneXpert MRSA Assay (FDA     approved for NASAL specimens     only), is one component of a     comprehensive MRSA colonization     surveillance program. It is not     intended to diagnose MRSA     infection nor to guide or     monitor treatment for     MRSA infections.  CULTURE, BLOOD (ROUTINE X 2)     Status: None   Collection Time    03/27/14  8:45 AM      Result Value Ref Range Status   Specimen  Description BLOOD RIGHT HAND   Final   Special Requests BOTTLES DRAWN AEROBIC AND ANAEROBIC 3 CC EA   Final   Culture  Setup Time     Final   Value: 03/27/2014 10:45     Performed at [facility]   Culture     Final   Value:        BLOOD CULTURE RECEIVED NO GROWTH TO DATE CULTURE WILL BE HELD FOR 5 DAYS BEFORE ISSUING A FINAL NEGATIVE REPORT     Performed at [facility]   Report Status PENDING   Incomplete  CULTURE, BLOOD (ROUTINE X 2)     Status: None   Collection Time    03/27/14  8:47 AM      Result Value Ref Range Status   Specimen Description BLOOD RIGHT ARM   Final   Special Requests BOTTLES DRAWN AEROBIC ONLY 1 CC BLUE   Final   Culture  Setup Time     Final   Value: 03/27/2014 10:45     Performed at [facility]   Culture     Final   Value:        BLOOD CULTURE RECEIVED NO GROWTH TO DATE CULTURE WILL BE HELD FOR 5 DAYS BEFORE ISSUING A FINAL NEGATIVE REPORT     Performed at [facility]   Report Status PENDING    Incomplete     Studies: Dg Chest Port 1 View  03/27/2014   CLINICAL DATA:  Preoperative examination (cystoscopy with left-sided ureteroscopy and left-sided retrograde stent placement)  EXAM: PORTABLE CHEST - 1 VIEW  COMPARISON:  Chest radiograph - 01/12/2014; chest CT -01/17/2014  FINDINGS: Grossly unchanged borderline enlarged cardiac silhouette and mediastinal contours with atherosclerotic plaque within the thoracic aorta. The lung volumes remain reduced with mild elevation of the right hemidiaphragms. Grossly unchanged retrocardiac opacity compatible with known hiatal hernia. Minimal bibasilar heterogeneous opacities, left greater than right, likely atelectasis. No new focal airspace opacities. No pleural effusion or pneumothorax. No evidence of edema. No acute osseus abnormalities.  IMPRESSION: 1. Hypoventilated lungs without acute cardiopulmonary disease. 2. Hiatal hernia.   Electronically Signed   By: Sandi Mariscal M.D.   On: 03/27/2014 08:41    Scheduled Meds: . bacitracin-polymyxin b  1 application Both Eyes QHS  . citalopram  20 mg Oral q morning - 10a  . clotrimazole   Topical BID  . Difluprednate  1 drop Right Eye QHS  . donepezil  5 mg Oral q morning - 10a  . irbesartan  150 mg Oral Daily  . loratadine  10 mg Oral Daily  . memantine  10 mg Oral BID  . OLANZapine zydis  5 mg Oral QHS  . pantoprazole  40 mg Oral Daily  . polyethylene glycol  17 g Oral Daily  . primidone  25 mg Oral QHS  . sucralfate  2 g Oral QHS   Continuous Infusions: . dextrose 5 % and 0.9% NaCl 100 mL/hr at 03/28/14 1129    Principal Problem:   Fever, unspecified Active Problems:   Renal stone   Acute encephalopathy    Time spent: 35 minutes. Greater than 50% of this time was spent in direct contact with the patient coordinating care.    [facility] Pager [phone]  If 7PM-7AM, please contact night-coverage at [email or web address], password [facility] 03/28/2014, 4:13 PM  LOS:  3 days

## 2014-03-28 NOTE — Discharge Summary (Signed)
Physician Discharge Summary  Patient ID: [name] [name] MRN: [id number] DOB/AGE: 1924-04-08 80 y.o.  Admit date: 03/25/2014 Discharge date: 03/28/2014  Admission Diagnoses:  Left renal stone.   Discharge Diagnoses:  Principal Problem:   Fever, unspecified Active Problems:   Renal stone   Acute encephalopathy   Past Medical History  Diagnosis Date  . ASCVD (arteriosclerotic cardiovascular disease) 2003    Single vessel disease-60% D1  . Hypertension   . Hyperlipidemia   . Leukopenia     and anemia-hematology evaluation was nodiagnostic  . Degenerative joint disease     right TKA in 4/06  . Upper GI bleed     gastric stress ulcer in 2003  . Retinal artery branch occlusion of right eye     Sudden onset of right eye blindness-embolism suspected  . Gastroesophageal reflux disease with hiatal hernia   . Osteoporosis   . Lung nodule     Left lower lobe  . Parkinson's disease     PRESUMED BECAUSE SHE HAS A LOT OF SHAKING - BUT HAS SEEN NEUROLOGIST  AND WAS PUT ON MYSOLINE AND FAMILY HAS SEEN BIG IMPROVEMENT IN THE SHAKING  . Cervical spondylarthritis     S/p discectomy  . Diverticulosis   . Hiatal hernia   . Leukocytosis   . Hyperlipidemia   . Degenerative joint disease   . GI bleed   . Cervical spondylarthritis   . Diverticulosis   . Shortness of breath     AND WHEEZING WITH ACTIVITY AND AT REST  . Coronary artery disease   . 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 Diabetes mellitus     No insulin- NO MEDS - LOST WEIGHT - NO LONGER HAS ELEVATED SUGARS  . History of nephrolithiasis   . Dementia     PT RESIDES AT [facility] - SHE TALKS SOME - DOES NOT AMBULATE- TRANSFERS TO W/C WITH 2 ASSISTANTS  . Alzheimer disease   . Senile dementia   . Alzheimer's disease     Surgeries: Procedure(s): CYSTOSCOPY WITH  LEFT URETEROSCOPY AND LEFT RETROGRADE  STENT PLACEMENT HOLMIUM LASER APPLICATION on  [id number]   Consultants (if any):  Hospitalist  Discharged Condition: Improved  Hospital Course: [name] [name] is an 79 y.o. female who was admitted 03/25/2014 with a diagnosis of Fever, unspecified and went to the operating room on 03/25/2014 and underwent the above named procedures.  Post operatively she had some mental status changes consistent with exacerbation of her dementia.  She the got a fever and was changed to zosyn and a hospitalist consultation was called.   She had a negative w/u for the fever and was felt to be ready to discharge to the nursing facility today.   She is to see me next week and then will need a second procedure to remove the residual stones.     She was given perioperative antibiotics:  Anti-infectives   Start     Dose/Rate Route Frequency Ordered Stop   03/27/14 1000  piperacillin-tazobactam (ZOSYN) IVPB 3.375 g  Status:  Discontinued     3.375 g 12.5 mL/hr over 240 Minutes Intravenous 3 times per day 03/27/14 0822 03/28/14 0950   03/26/14 0000  ciprofloxacin (CIPRO) 250 MG tablet     250 mg Oral 2 times daily 03/26/14 0643     03/25/14 1500  ciprofloxacin (CIPRO) tablet 250 mg  Status:  Discontinued  250 mg Oral 2 times daily 03/25/14 1317 03/27/14 0804   03/25/14 0708  ciprofloxacin (CIPRO) IVPB 400 mg     400 mg 200 mL/hr over 60 Minutes Intravenous 60 min pre-op 03/25/14 0708 03/25/14 0910    .  She was given sequential compression devices for DVT prophylaxis.  She benefited maximally from the hospital stay and there were no complications.    Recent vital signs:  Filed Vitals:   03/28/14 0700  BP: 117/63  Pulse: 89  Temp: 98.8 F (37.1 C)  Resp: 18    Recent laboratory studies:  Lab Results  Component Value Date   HGB 11.7* 03/27/2014   HGB 12.5 03/21/2014   HGB 11.7* 01/12/2014   Lab Results  Component Value Date   WBC 8.4 03/27/2014   PLT PLATELET CLUMPS NOTED ON SMEAR, UNABLE TO ESTIMATE 03/27/2014   No results found for this basename:  INR   Lab Results  Component Value Date   NA 134* 03/27/2014   K 4.7 03/27/2014   CL 100 03/27/2014   CO2 21 03/27/2014   BUN 11 03/27/2014   CREATININE 1.06 03/27/2014   GLUCOSE 199* 03/27/2014    Discharge Medications:     Medication List         acetaminophen 500 MG tablet  Commonly known as:  TYLENOL  Take 500 mg by mouth 2 (two) times daily.     acetaminophen 500 MG tablet  Commonly known as:  TYLENOL  Take 1,000 mg by mouth every 6 (six) hours as needed for mild pain or moderate pain.     aspirin EC 81 MG tablet  Take 81 mg by mouth daily.     B-complex with vitamin C tablet  Take 1 tablet by mouth daily.     bacitracin-polymyxin b ophthalmic ointment  Commonly known as:  POLYSPORIN  Place 1 application into both eyes at bedtime. apply to eye every 12 hours while awake     ciprofloxacin 250 MG tablet  Commonly known as:  CIPRO  Take 1 tablet (250 mg total) by mouth 2 (two) times daily.     citalopram 20 MG tablet  Commonly known as:  CELEXA  Take 20 mg by mouth every morning.     clotrimazole-betamethasone cream  Commonly known as:  LOTRISONE  Apply 1 application topically 2 (two) times daily.     donepezil 5 MG tablet  Commonly known as:  ARICEPT  Take 5 mg by mouth every morning.     DUREZOL 0.05 % Emul  Generic drug:  Difluprednate  Place 1 drop into the right eye at bedtime.     hydroxypropyl methylcellulose 2.5 % ophthalmic solution  Commonly known as:  ISOPTO TEARS  Place 1 drop into both eyes 3 (three) times daily as needed for dry eyes.     loratadine 10 MG tablet  Commonly known as:  CLARITIN  Take 10 mg by mouth daily.     memantine 10 MG tablet  Commonly known as:  NAMENDA  Take 10 mg by mouth 2 (two) times daily.     mometasone 0.1 % cream  Commonly known as:  ELOCON  Apply 1 application topically 2 (two) times daily.     olmesartan 20 MG tablet  Commonly known as:  BENICAR  Take 20 mg by mouth every morning.     omeprazole 40 MG capsule   Commonly known as:  PRILOSEC  Take 40 mg by mouth 2 (two) times daily.  ondansetron 4 MG disintegrating tablet  Commonly known as:  ZOFRAN-ODT  Take 4 mg by mouth every 6 (six) hours as needed for nausea or vomiting.     polyethylene glycol packet  Commonly known as:  MIRALAX / GLYCOLAX  Take 17 g by mouth daily. *Mixed with 8 ounces of water/juice daily*     primidone 50 MG tablet  Commonly known as:  MYSOLINE  Take 25 mg by mouth at bedtime.     sucralfate 1 G tablet  Commonly known as:  CARAFATE  Take 2 g by mouth at bedtime.     traMADol 50 MG tablet  Commonly known as:  ULTRAM  Take 1 tablet (50 mg total) by mouth every 6 (six) hours as needed for moderate pain.     vitamin E 400 UNIT capsule  Take 400 Units by mouth daily.        Diagnostic Studies: Ct Abdomen Pelvis Wo Contrast  03/04/2014   CLINICAL DATA:  Bilateral kidney stones.  Diabetes.  Dementia.  EXAM: CT ABDOMEN AND PELVIS WITHOUT CONTRAST  TECHNIQUE: Multidetector CT imaging of the abdomen and pelvis was performed following the standard protocol without intravenous contrast.  COMPARISON:  DG ABDOMEN 1V dated 02/19/2014; CT ABD - PELV W/ CM dated 10/29/2013; CT ABD/PELVIS W CM dated 09/21/2011; CT L SPINE W/O CM dated 02/06/2010  FINDINGS: Lower Chest: Mild motion degradation at the lung bases. Grossly clear. Normal heart size without pericardial or pleural effusion. Multivessel coronary artery atherosclerosis. No pericardial or pleural effusion. A moderate hiatal hernia.  Abdomen/Pelvis: Mild hepatic steatosis. Motion degradation continuing into the abdomen. Normal noncontrast appearance of the spleen, distal stomach. Pancreatic fatty atrophy.  Cholecystectomy, without biliary ductal dilatation. Normal adrenal glands. Bilateral renal atrophy, greater on the left.  A stone at the left ureteral pelvic junction measures 1.2 cm and is minimally enlarged. Similar in position to on the prior. No significant obstruction.  Punctate right renal collecting system calculus. Probable lower pole left renal collecting system punctate calculus.  A multi septated, partially calcified lower pole right renal mass is again identified. Similar in size measuring 4.4 x 4.2 cm on image 33. No hydroureter or ureteric calculi.  Aortic and branch vessel atherosclerosis. No retroperitoneal or retrocrural adenopathy. Scattered colonic diverticula. Normal terminal ileum. Appendix likely identified and normal. No right lower quadrant inflammation. Normal small bowel without abdominal ascites.  No pelvic adenopathy. Pelvic floor laxity. Hysterectomy. Otherwise normal urinary bladder, without adnexal mass or significant free pelvic fluid.  Bones/Musculoskeletal: Osteopenia. Remote left rib trauma. A moderate L1 compression deformity is similar. Mild ventral canal encroachment.  IMPRESSION: 1. Motion degradation throughout. 2. Enlargement of a left ureteropelvic junction calculus, without urinary tract obstruction. 3. Bilateral nephrolithiasis. 4. Similar size of a multi septated cystic lesion within the lower pole right kidney. This is technically indeterminate, and could represent a complex cyst, area of of localized cystic disease, or even a cystic neoplasm. However, given presence back to 09/21/2011, and patient age/comorbidities, is of doubtful clinical significance. 5. Osteopenia with chronic L1 compression deformity. 6. A moderate hiatal hernia.   Electronically Signed   By: Abigail Miyamoto M.D.   On: 03/04/2014 09:08   Dg Chest Port 1 View  03/27/2014   CLINICAL DATA:  Preoperative examination (cystoscopy with left-sided ureteroscopy and left-sided retrograde stent placement)  EXAM: PORTABLE CHEST - 1 VIEW  COMPARISON:  Chest radiograph - 01/12/2014; chest CT -01/17/2014  FINDINGS: Grossly unchanged borderline enlarged cardiac silhouette and mediastinal contours with  atherosclerotic plaque within the thoracic aorta. The lung volumes remain reduced with  mild elevation of the right hemidiaphragms. Grossly unchanged retrocardiac opacity compatible with known hiatal hernia. Minimal bibasilar heterogeneous opacities, left greater than right, likely atelectasis. No new focal airspace opacities. No pleural effusion or pneumothorax. No evidence of edema. No acute osseus abnormalities.  IMPRESSION: 1. Hypoventilated lungs without acute cardiopulmonary disease. 2. Hiatal hernia.   Electronically Signed   By: Sandi Mariscal M.D.   On: 03/27/2014 08:41    Disposition: 01-Home or Self Care      Discharge Orders   Future Appointments Provider Department Dept Phone   04/11/2014 10:30 AM Kathyrn Drown, MD [place] [phone]   05/16/2014 12:00 PM Star Age, MD [facility] [phone]   07/01/2014 2:00 PM Rogene Houston, MD [place] [phone]   01/30/2015 9:15 AM Hayden Pedro, MD [facility] [phone]   Future Orders Complete By Expires   Discontinue IV  As directed       Follow-up Information   Follow up with Malka So, MD. (Office will call to arrange next procedure. )    Specialty:  Urology   Contact information:   [facility]  [place] [address] [phone]       Follow up with Malka So, MD On 04/04/2014. (F/u in [place] office as scheduled.)    Specialty:  Urology   Contact information:   [facility]  [place] [address] [phone]        Signed: Irine Seal 03/28/2014, 11:55 AM

## 2014-03-28 NOTE — Progress Notes (Signed)
I have reviewed all documentation from student nurse. 

## 2014-03-28 NOTE — Progress Notes (Signed)
Pt leaving at this time with EMS.

## 2014-03-28 NOTE — Progress Notes (Signed)
3 Days Post-Op Subjective: Daughters report that their mother is more alert today. She slept better last night. She is eating adequately. Objective: Vital signs in last 24 hours: Temp:  [98.2 F (36.8 C)-99.3 F (37.4 C)] 98.4 F (36.9 C) (05/08 0600) Pulse Rate:  [79-95] 79 (05/08 0600) Resp:  [18-20] 20 (05/08 0600) BP: (110-136)/(56-86) 136/56 mmHg (05/08 0600) SpO2:  [95 %-97 %] 96 % (05/08 0700)  Intake/Output from previous day: 05/07 0701 - 05/08 0700 In: 2726.7 [P.O.:600; I.V.:1976.7; IV Piggyback:150] Out: -  Intake/Output this shift: Total I/O In: 240 [P.O.:240] Out: -   Physical Exam:  Constitutional: Vital signs reviewed. WD WN in NAD   Eyes: PERRL, No scleral icterus.   Pulmonary/Chest: Normal effort Abdominal: Soft. Non-tender, non-distended, bowel sounds are normal, no masses, organomegaly, or guarding present.  Extremities: No cyanosis or edema   Lab Results:  Recent Labs  03/27/14 0847  HGB 11.7*  HCT 35.3*   BMET  Recent Labs  03/27/14 0847  NA 134*  K 4.7  CL 100  CO2 21  GLUCOSE 199*  BUN 11  CREATININE 1.06  CALCIUM 8.5   No results found for this basename: LABPT, INR,  in the last 72 hours No results found for this basename: LABURIN,  in the last 72 hours Results for orders placed during the hospital encounter of 03/25/14  MRSA PCR SCREENING     Status: None   Collection Time    03/25/14  8:55 PM      Result Value Ref Range Status   MRSA by PCR NEGATIVE  NEGATIVE Final   Comment:            The GeneXpert MRSA Assay (FDA     approved for NASAL specimens     only), is one component of a     comprehensive MRSA colonization     surveillance program. It is not     intended to diagnose MRSA     infection nor to guide or     monitor treatment for     MRSA infections.  CULTURE, BLOOD (ROUTINE X 2)     Status: None   Collection Time    03/27/14  8:45 AM      Result Value Ref Range Status   Specimen Description BLOOD RIGHT HAND    Final   Special Requests BOTTLES DRAWN AEROBIC AND ANAEROBIC 3 CC EA   Final   Culture  Setup Time     Final   Value: 03/27/2014 10:45     Performed at [facility]   Culture     Final   Value:        BLOOD CULTURE RECEIVED NO GROWTH TO DATE CULTURE WILL BE HELD FOR 5 DAYS BEFORE ISSUING A FINAL NEGATIVE REPORT     Performed at [facility]   Report Status PENDING   Incomplete  CULTURE, BLOOD (ROUTINE X 2)     Status: None   Collection Time    03/27/14  8:47 AM      Result Value Ref Range Status   Specimen Description BLOOD RIGHT ARM   Final   Special Requests BOTTLES DRAWN AEROBIC ONLY 1 CC BLUE   Final   Culture  Setup Time     Final   Value: 03/27/2014 10:45     Performed at [facility]   Culture     Final   Value:        BLOOD CULTURE RECEIVED NO  GROWTH TO DATE CULTURE WILL BE HELD FOR 5 DAYS BEFORE ISSUING A FINAL NEGATIVE REPORT     Performed at [facility]   Report Status PENDING   Incomplete    Studies/Results: Dg Chest Port 1 View  03/27/2014   CLINICAL DATA:  Preoperative examination (cystoscopy with left-sided ureteroscopy and left-sided retrograde stent placement)  EXAM: PORTABLE CHEST - 1 VIEW  COMPARISON:  Chest radiograph - 01/12/2014; chest CT -01/17/2014  FINDINGS: Grossly unchanged borderline enlarged cardiac silhouette and mediastinal contours with atherosclerotic plaque within the thoracic aorta. The lung volumes remain reduced with mild elevation of the right hemidiaphragms. Grossly unchanged retrocardiac opacity compatible with known hiatal hernia. Minimal bibasilar heterogeneous opacities, left greater than right, likely atelectasis. No new focal airspace opacities. No pleural effusion or pneumothorax. No evidence of edema. No acute osseus abnormalities.  IMPRESSION: 1. Hypoventilated lungs without acute cardiopulmonary disease. 2. Hiatal hernia.   Electronically Signed   By: Sandi Mariscal M.D.   On: 03/27/2014 08:41     Assessment/Plan:   Status post ureteroscopy with laser lithotripsy. Postoperative fever has improved. She is elderly, frail, but is improving. She is having no respiratory distress, cultures no growth.    Continue medical therapy per hospitalist service. Appropriate transfer back to nursing facility per hospitalist service.   LOS: 3 days   Franchot Gallo 03/28/2014, 8:35 AM

## 2014-03-28 NOTE — Progress Notes (Signed)
Patient is set to discharge back to [facility] today. Patient's daughters at bedside aware. Discharge packet in [place], Abigail Butts aware. [facility] called for transport pickup.   Raynaldo Opitz, [facility] Clinical Social Worker cell #: [phone]

## 2014-03-30 LAB — URINE CULTURE
Colony Count: >=100000
SPECIAL REQUESTS: NORMAL

## 2014-03-31 ENCOUNTER — Other Ambulatory Visit: Payer: Self-pay | Admitting: *Deleted

## 2014-04-02 DIAGNOSIS — E119 Type 2 diabetes mellitus without complications: Secondary | ICD-10-CM | POA: Diagnosis not present

## 2014-04-02 DIAGNOSIS — G20 Parkinson's disease: Secondary | ICD-10-CM | POA: Diagnosis not present

## 2014-04-02 DIAGNOSIS — I129 Hypertensive chronic kidney disease with stage 1 through stage 4 chronic kidney disease, or unspecified chronic kidney disease: Secondary | ICD-10-CM | POA: Diagnosis not present

## 2014-04-02 DIAGNOSIS — N200 Calculus of kidney: Secondary | ICD-10-CM | POA: Diagnosis not present

## 2014-04-02 DIAGNOSIS — M810 Age-related osteoporosis without current pathological fracture: Secondary | ICD-10-CM | POA: Diagnosis not present

## 2014-04-02 DIAGNOSIS — R32 Unspecified urinary incontinence: Secondary | ICD-10-CM | POA: Diagnosis not present

## 2014-04-02 DIAGNOSIS — I2510 Atherosclerotic heart disease of native coronary artery without angina pectoris: Secondary | ICD-10-CM | POA: Diagnosis not present

## 2014-04-02 DIAGNOSIS — N390 Urinary tract infection, site not specified: Secondary | ICD-10-CM | POA: Diagnosis not present

## 2014-04-02 DIAGNOSIS — F0280 Dementia in other diseases classified elsewhere without behavioral disturbance: Secondary | ICD-10-CM | POA: Diagnosis not present

## 2014-04-02 DIAGNOSIS — Z9181 History of falling: Secondary | ICD-10-CM | POA: Diagnosis not present

## 2014-04-02 DIAGNOSIS — G309 Alzheimer's disease, unspecified: Secondary | ICD-10-CM | POA: Diagnosis not present

## 2014-04-02 DIAGNOSIS — M47812 Spondylosis without myelopathy or radiculopathy, cervical region: Secondary | ICD-10-CM | POA: Diagnosis not present

## 2014-04-02 DIAGNOSIS — M1990 Unspecified osteoarthritis, unspecified site: Secondary | ICD-10-CM | POA: Diagnosis not present

## 2014-04-02 DIAGNOSIS — N183 Chronic kidney disease, stage 3 unspecified: Secondary | ICD-10-CM | POA: Diagnosis not present

## 2014-04-02 LAB — CULTURE, BLOOD (ROUTINE X 2)
CULTURE: NO GROWTH
Culture: NO GROWTH

## 2014-04-03 DIAGNOSIS — N200 Calculus of kidney: Secondary | ICD-10-CM | POA: Diagnosis not present

## 2014-04-03 DIAGNOSIS — G309 Alzheimer's disease, unspecified: Secondary | ICD-10-CM | POA: Diagnosis not present

## 2014-04-03 DIAGNOSIS — N390 Urinary tract infection, site not specified: Secondary | ICD-10-CM | POA: Diagnosis not present

## 2014-04-03 DIAGNOSIS — M810 Age-related osteoporosis without current pathological fracture: Secondary | ICD-10-CM | POA: Diagnosis not present

## 2014-04-03 DIAGNOSIS — E119 Type 2 diabetes mellitus without complications: Secondary | ICD-10-CM | POA: Diagnosis not present

## 2014-04-03 DIAGNOSIS — F0280 Dementia in other diseases classified elsewhere without behavioral disturbance: Secondary | ICD-10-CM | POA: Diagnosis not present

## 2014-04-03 DIAGNOSIS — M47812 Spondylosis without myelopathy or radiculopathy, cervical region: Secondary | ICD-10-CM | POA: Diagnosis not present

## 2014-04-04 ENCOUNTER — Telehealth: Payer: Self-pay | Admitting: Family Medicine

## 2014-04-04 ENCOUNTER — Ambulatory Visit: Payer: Medicare Other | Admitting: Urology

## 2014-04-04 ENCOUNTER — Other Ambulatory Visit: Payer: Self-pay | Admitting: *Deleted

## 2014-04-04 ENCOUNTER — Telehealth: Payer: Self-pay | Admitting: *Deleted

## 2014-04-04 DIAGNOSIS — F0280 Dementia in other diseases classified elsewhere without behavioral disturbance: Secondary | ICD-10-CM | POA: Diagnosis not present

## 2014-04-04 DIAGNOSIS — E119 Type 2 diabetes mellitus without complications: Secondary | ICD-10-CM | POA: Diagnosis not present

## 2014-04-04 DIAGNOSIS — N200 Calculus of kidney: Secondary | ICD-10-CM | POA: Diagnosis not present

## 2014-04-04 DIAGNOSIS — G309 Alzheimer's disease, unspecified: Secondary | ICD-10-CM | POA: Diagnosis not present

## 2014-04-04 DIAGNOSIS — N390 Urinary tract infection, site not specified: Secondary | ICD-10-CM | POA: Diagnosis not present

## 2014-04-04 DIAGNOSIS — M47812 Spondylosis without myelopathy or radiculopathy, cervical region: Secondary | ICD-10-CM | POA: Diagnosis not present

## 2014-04-04 DIAGNOSIS — M810 Age-related osteoporosis without current pathological fracture: Secondary | ICD-10-CM | POA: Diagnosis not present

## 2014-04-04 NOTE — Telephone Encounter (Signed)
Huh?

## 2014-04-04 NOTE — Telephone Encounter (Signed)
Patient's nurse notified and verbalized understanding. Told her to relay message to next nurse.

## 2014-04-04 NOTE — Telephone Encounter (Signed)
Pt had a kidney stone removed. Elmyra Ricks (a home health nurse for [facility]) was doing and in/out cath and noticed about 15/20 bumps with white heads. She said they do not looked pus-filled. She is wondering if they are genital warts b/c they are on both sides of the labia. Elmyra Ricks did not notice them 2 months ago when she performed hygiene on her before.

## 2014-04-04 NOTE — Telephone Encounter (Signed)
LOL I said the same thing! So I asked Estill Bamberg and she said that [place] (grand daughter) said that you two talked about putting [name] [name] in a nursing home. Right now she has a home health nurse to come in for care. The family talked and said that they would like for Lavra to go into a nursing home as long as you think insurance would pay for it?

## 2014-04-04 NOTE — Telephone Encounter (Signed)
Genital warts unusual to happen if pustules and infection the n needs OV if bumps don't look infected then can be seen at next routine OV

## 2014-04-04 NOTE — Telephone Encounter (Signed)
Patients family agrees that a home would be OK for patient, as long as Dr. Nicki Reaper thinks insurance would pay for it.

## 2014-04-07 ENCOUNTER — Other Ambulatory Visit: Payer: Self-pay | Admitting: Family Medicine

## 2014-04-08 ENCOUNTER — Encounter (HOSPITAL_COMMUNITY): Payer: Self-pay | Admitting: *Deleted

## 2014-04-08 DIAGNOSIS — M47812 Spondylosis without myelopathy or radiculopathy, cervical region: Secondary | ICD-10-CM | POA: Diagnosis not present

## 2014-04-08 DIAGNOSIS — E119 Type 2 diabetes mellitus without complications: Secondary | ICD-10-CM | POA: Diagnosis not present

## 2014-04-08 DIAGNOSIS — M810 Age-related osteoporosis without current pathological fracture: Secondary | ICD-10-CM | POA: Diagnosis not present

## 2014-04-08 DIAGNOSIS — F0280 Dementia in other diseases classified elsewhere without behavioral disturbance: Secondary | ICD-10-CM | POA: Diagnosis not present

## 2014-04-08 DIAGNOSIS — N200 Calculus of kidney: Secondary | ICD-10-CM | POA: Diagnosis not present

## 2014-04-08 DIAGNOSIS — N390 Urinary tract infection, site not specified: Secondary | ICD-10-CM | POA: Diagnosis not present

## 2014-04-08 NOTE — Progress Notes (Signed)
On 04/07/14 - spoke with pt's daughter / Kandis Nab - reviewed preop instructions - she will bring her mother for her surgery and sign the or consent.  I faxed preop instructions and chlorhexidine instructions and precautions to [facility] yesterday and [place] at [facility] called back to say instructions received.

## 2014-04-10 ENCOUNTER — Encounter (HOSPITAL_COMMUNITY): Payer: Medicare Other | Admitting: Anesthesiology

## 2014-04-10 ENCOUNTER — Encounter (HOSPITAL_COMMUNITY): Payer: Self-pay | Admitting: *Deleted

## 2014-04-10 ENCOUNTER — Ambulatory Visit (HOSPITAL_COMMUNITY): Payer: Medicare Other | Admitting: Anesthesiology

## 2014-04-10 ENCOUNTER — Ambulatory Visit (HOSPITAL_COMMUNITY)
Admission: RE | Admit: 2014-04-10 | Discharge: 2014-04-11 | Disposition: A | Discharge status: Home / Self Care | Payer: Medicare Other | Source: Ambulatory Visit | Attending: Urology | Admitting: Urology

## 2014-04-10 ENCOUNTER — Encounter (HOSPITAL_COMMUNITY)
Admission: RE | Disposition: A | Discharge status: Home / Self Care | Payer: Self-pay | Source: Ambulatory Visit | Attending: Urology

## 2014-04-10 DIAGNOSIS — N281 Cyst of kidney, acquired: Secondary | ICD-10-CM | POA: Diagnosis not present

## 2014-04-10 DIAGNOSIS — R32 Unspecified urinary incontinence: Secondary | ICD-10-CM | POA: Insufficient documentation

## 2014-04-10 DIAGNOSIS — I10 Essential (primary) hypertension: Secondary | ICD-10-CM | POA: Diagnosis not present

## 2014-04-10 DIAGNOSIS — Z96659 Presence of unspecified artificial knee joint: Secondary | ICD-10-CM | POA: Diagnosis not present

## 2014-04-10 DIAGNOSIS — F0280 Dementia in other diseases classified elsewhere without behavioral disturbance: Secondary | ICD-10-CM | POA: Insufficient documentation

## 2014-04-10 DIAGNOSIS — E119 Type 2 diabetes mellitus without complications: Secondary | ICD-10-CM | POA: Diagnosis not present

## 2014-04-10 DIAGNOSIS — Z8711 Personal history of peptic ulcer disease: Secondary | ICD-10-CM | POA: Diagnosis not present

## 2014-04-10 DIAGNOSIS — E7800 Pure hypercholesterolemia, unspecified: Secondary | ICD-10-CM | POA: Diagnosis not present

## 2014-04-10 DIAGNOSIS — N200 Calculus of kidney: Secondary | ICD-10-CM | POA: Diagnosis present

## 2014-04-10 DIAGNOSIS — Z87440 Personal history of urinary (tract) infections: Secondary | ICD-10-CM | POA: Diagnosis not present

## 2014-04-10 DIAGNOSIS — F411 Generalized anxiety disorder: Secondary | ICD-10-CM | POA: Diagnosis not present

## 2014-04-10 DIAGNOSIS — Z7982 Long term (current) use of aspirin: Secondary | ICD-10-CM | POA: Diagnosis not present

## 2014-04-10 DIAGNOSIS — G309 Alzheimer's disease, unspecified: Secondary | ICD-10-CM | POA: Insufficient documentation

## 2014-04-10 DIAGNOSIS — Z79899 Other long term (current) drug therapy: Secondary | ICD-10-CM | POA: Insufficient documentation

## 2014-04-10 DIAGNOSIS — R0602 Shortness of breath: Secondary | ICD-10-CM | POA: Diagnosis not present

## 2014-04-10 DIAGNOSIS — K219 Gastro-esophageal reflux disease without esophagitis: Secondary | ICD-10-CM | POA: Diagnosis not present

## 2014-04-10 DIAGNOSIS — M129 Arthropathy, unspecified: Secondary | ICD-10-CM | POA: Insufficient documentation

## 2014-04-10 HISTORY — PX: HOLMIUM LASER APPLICATION: SHX5852

## 2014-04-10 HISTORY — PX: CYSTOSCOPY/RETROGRADE/URETEROSCOPY/STONE EXTRACTION WITH BASKET: SHX5317

## 2014-04-10 LAB — BASIC METABOLIC PANEL
BUN: 26 mg/dL — ABNORMAL HIGH (ref 6–23)
CHLORIDE: 103 meq/L (ref 96–112)
CO2: 27 mEq/L (ref 19–32)
Calcium: 9.9 mg/dL (ref 8.4–10.5)
Creatinine, Ser: 1.23 mg/dL — ABNORMAL HIGH (ref 0.50–1.10)
GFR, EST AFRICAN AMERICAN: 44 mL/min — AB (ref >90)
GFR, EST NON AFRICAN AMERICAN: 38 mL/min — AB (ref >90)
Glucose, Bld: 147 mg/dL — ABNORMAL HIGH (ref 70–99)
POTASSIUM: 4.3 meq/L (ref 3.7–5.3)
Sodium: 141 mEq/L (ref 137–147)

## 2014-04-10 LAB — CBC
HCT: 39.4 % (ref 36.0–46.0)
Hemoglobin: 12.7 g/dL (ref 12.0–15.0)
MCH: 32.3 pg (ref 26.0–34.0)
MCHC: 32.2 g/dL (ref 30.0–36.0)
MCV: 100.3 fL — ABNORMAL HIGH (ref 78.0–100.0)
PLATELETS: 281 10*3/uL (ref 150–400)
RBC: 3.93 MIL/uL (ref 3.87–5.11)
RDW: 13.4 % (ref 11.5–15.5)
WBC: 5.6 10*3/uL (ref 4.0–10.5)

## 2014-04-10 LAB — GLUCOSE, CAPILLARY
Glucose-Capillary: 121 mg/dL — ABNORMAL HIGH (ref 70–99)
Glucose-Capillary: 98 mg/dL (ref 70–99)

## 2014-04-10 SURGERY — CYSTOSCOPY, WITH CALCULUS REMOVAL USING BASKET
Anesthesia: General | Laterality: Left

## 2014-04-10 MED ORDER — ACETAMINOPHEN 10 MG/ML IV SOLN
1000.0000 mg | Freq: Four times a day (QID) | INTRAVENOUS | Status: DC
  Administered 2014-04-11 (×2): 1000 mg via INTRAVENOUS
  Filled 2014-04-10 (×5): qty 100

## 2014-04-10 MED ORDER — CEFAZOLIN SODIUM-DEXTROSE 2-3 GM-% IV SOLR
2.0000 g | Freq: Once | INTRAVENOUS | Status: AC
  Administered 2014-04-10: 2 g via INTRAVENOUS

## 2014-04-10 MED ORDER — POLYETHYLENE GLYCOL 3350 17 G PO PACK
17.0000 g | PACK | Freq: Every day | ORAL | Status: DC
  Administered 2014-04-10 – 2014-04-11 (×2): 17 g via ORAL
  Filled 2014-04-10 (×2): qty 1

## 2014-04-10 MED ORDER — MEMANTINE HCL 10 MG PO TABS
10.0000 mg | ORAL_TABLET | Freq: Two times a day (BID) | ORAL | Status: DC
  Administered 2014-04-10 – 2014-04-11 (×2): 10 mg via ORAL
  Filled 2014-04-10 (×3): qty 1

## 2014-04-10 MED ORDER — IRBESARTAN 75 MG PO TABS
75.0000 mg | ORAL_TABLET | Freq: Every day | ORAL | Status: DC
  Administered 2014-04-10 – 2014-04-11 (×2): 75 mg via ORAL
  Filled 2014-04-10 (×2): qty 1

## 2014-04-10 MED ORDER — PRIMIDONE 50 MG PO TABS
25.0000 mg | ORAL_TABLET | Freq: Every day | ORAL | Status: DC
  Administered 2014-04-10: 25 mg via ORAL
  Filled 2014-04-10 (×2): qty 0.5

## 2014-04-10 MED ORDER — HYOSCYAMINE SULFATE 0.125 MG SL SUBL
0.1250 mg | SUBLINGUAL_TABLET | SUBLINGUAL | Status: DC | PRN
  Filled 2014-04-10: qty 1

## 2014-04-10 MED ORDER — ONDANSETRON HCL 4 MG/2ML IJ SOLN
INTRAMUSCULAR | Status: DC | PRN
  Administered 2014-04-10: 4 mg via INTRAVENOUS

## 2014-04-10 MED ORDER — FENTANYL CITRATE 0.05 MG/ML IJ SOLN: 25.0000 ug | INTRAMUSCULAR | Status: DC | PRN

## 2014-04-10 MED ORDER — ONDANSETRON HCL 4 MG/2ML IJ SOLN
INTRAMUSCULAR | Status: AC
  Filled 2014-04-10: qty 2

## 2014-04-10 MED ORDER — CIPROFLOXACIN IN D5W 400 MG/200ML IV SOLN: 400.0000 mg | INTRAVENOUS | Status: DC

## 2014-04-10 MED ORDER — LIDOCAINE HCL (CARDIAC) 20 MG/ML IV SOLN
INTRAVENOUS | Status: AC
  Filled 2014-04-10: qty 5

## 2014-04-10 MED ORDER — KCL IN DEXTROSE-NACL 20-5-0.45 MEQ/L-%-% IV SOLN
INTRAVENOUS | Status: DC
  Administered 2014-04-10: 18:00:00 via INTRAVENOUS
  Filled 2014-04-10 (×3): qty 1000

## 2014-04-10 MED ORDER — POLYVINYL ALCOHOL 1.4 % OP SOLN
1.0000 [drp] | Freq: Three times a day (TID) | OPHTHALMIC | Status: DC | PRN
  Filled 2014-04-10: qty 15

## 2014-04-10 MED ORDER — PANTOPRAZOLE SODIUM 40 MG PO TBEC
40.0000 mg | DELAYED_RELEASE_TABLET | Freq: Every day | ORAL | Status: DC
  Administered 2014-04-11: 40 mg via ORAL
  Filled 2014-04-10: qty 1

## 2014-04-10 MED ORDER — TRAMADOL HCL 50 MG PO TABS: 50.0000 mg | ORAL_TABLET | Freq: Four times a day (QID) | ORAL | Status: DC | PRN

## 2014-04-10 MED ORDER — IOHEXOL 300 MG/ML  SOLN
INTRAMUSCULAR | Status: DC | PRN
  Administered 2014-04-10: 4 mL

## 2014-04-10 MED ORDER — SUCRALFATE 1 G PO TABS
2.0000 g | ORAL_TABLET | Freq: Every day | ORAL | Status: DC
  Administered 2014-04-10: 2 g via ORAL
  Filled 2014-04-10 (×2): qty 2

## 2014-04-10 MED ORDER — FENTANYL CITRATE 0.05 MG/ML IJ SOLN
INTRAMUSCULAR | Status: DC | PRN
  Administered 2014-04-10: 12.5 ug via INTRAVENOUS
  Administered 2014-04-10: 25 ug via INTRAVENOUS
  Administered 2014-04-10: 12.5 ug via INTRAVENOUS
  Administered 2014-04-10: 25 ug via INTRAVENOUS

## 2014-04-10 MED ORDER — MOMETASONE FUROATE 0.1 % EX CREA
1.0000 "application " | TOPICAL_CREAM | Freq: Two times a day (BID) | CUTANEOUS | Status: DC
  Filled 2014-04-10 (×2): qty 15

## 2014-04-10 MED ORDER — CIPROFLOXACIN IN D5W 400 MG/200ML IV SOLN
INTRAVENOUS | Status: AC
  Filled 2014-04-10: qty 200

## 2014-04-10 MED ORDER — SODIUM CHLORIDE 0.9 % IR SOLN
Status: DC | PRN
  Administered 2014-04-10: 6000 mL

## 2014-04-10 MED ORDER — DONEPEZIL HCL 5 MG PO TABS
5.0000 mg | ORAL_TABLET | Freq: Every morning | ORAL | Status: DC
  Filled 2014-04-10: qty 1

## 2014-04-10 MED ORDER — PROPOFOL 10 MG/ML IV BOLUS
INTRAVENOUS | Status: AC
  Filled 2014-04-10: qty 20

## 2014-04-10 MED ORDER — LACTATED RINGERS IV SOLN
INTRAVENOUS | Status: DC
  Administered 2014-04-10: 1000 mL via INTRAVENOUS

## 2014-04-10 MED ORDER — BACITRACIN-POLYMYXIN B 500-10000 UNIT/GM OP OINT
1.0000 "application " | TOPICAL_OINTMENT | Freq: Every day | OPHTHALMIC | Status: DC
  Administered 2014-04-10: 1 via OPHTHALMIC
  Filled 2014-04-10: qty 3.5

## 2014-04-10 MED ORDER — CEPHALEXIN 250 MG PO CAPS: 250.0000 mg | ORAL_CAPSULE | Freq: Three times a day (TID) | ORAL | Status: DC

## 2014-04-10 MED ORDER — DIFLUPREDNATE 0.05 % OP EMUL
1.0000 [drp] | Freq: Every day | OPHTHALMIC | Status: DC
  Administered 2014-04-10: 1 [drp] via OPHTHALMIC

## 2014-04-10 MED ORDER — CITALOPRAM HYDROBROMIDE 20 MG PO TABS
20.0000 mg | ORAL_TABLET | Freq: Every morning | ORAL | Status: DC
  Administered 2014-04-11: 20 mg via ORAL
  Filled 2014-04-10: qty 1

## 2014-04-10 MED ORDER — CEPHALEXIN 500 MG PO CAPS
500.0000 mg | ORAL_CAPSULE | Freq: Three times a day (TID) | ORAL | Status: DC
  Administered 2014-04-10 – 2014-04-11 (×4): 500 mg via ORAL
  Filled 2014-04-10 (×5): qty 1

## 2014-04-10 MED ORDER — PROMETHAZINE HCL 25 MG/ML IJ SOLN: 6.2500 mg | INTRAMUSCULAR | Status: DC | PRN

## 2014-04-10 MED ORDER — PROPOFOL 10 MG/ML IV BOLUS
INTRAVENOUS | Status: DC | PRN
  Administered 2014-04-10: 100 mg via INTRAVENOUS

## 2014-04-10 MED ORDER — LORATADINE 10 MG PO TABS
10.0000 mg | ORAL_TABLET | Freq: Every day | ORAL | Status: DC
  Administered 2014-04-10 – 2014-04-11 (×2): 10 mg via ORAL
  Filled 2014-04-10 (×2): qty 1

## 2014-04-10 MED ORDER — LIDOCAINE HCL 1 % IJ SOLN
INTRAMUSCULAR | Status: DC | PRN
  Administered 2014-04-10: 50 mg via INTRADERMAL

## 2014-04-10 MED ORDER — FENTANYL CITRATE 0.05 MG/ML IJ SOLN
INTRAMUSCULAR | Status: AC
  Filled 2014-04-10: qty 2

## 2014-04-10 MED ORDER — ACETAMINOPHEN 10 MG/ML IV SOLN
1000.0000 mg | Freq: Once | INTRAVENOUS | Status: AC
Start: 2014-04-10 — End: 2014-04-10
  Administered 2014-04-10: 1000 mg via INTRAVENOUS
  Filled 2014-04-10: qty 100

## 2014-04-10 MED ORDER — ONDANSETRON 4 MG PO TBDP
4.0000 mg | ORAL_TABLET | Freq: Four times a day (QID) | ORAL | Status: DC | PRN
  Filled 2014-04-10: qty 1

## 2014-04-10 MED ORDER — CEFAZOLIN SODIUM-DEXTROSE 2-3 GM-% IV SOLR
INTRAVENOUS | Status: AC
  Filled 2014-04-10: qty 50

## 2014-04-10 MED ORDER — CLOTRIMAZOLE 1 % EX CREA
TOPICAL_CREAM | Freq: Two times a day (BID) | CUTANEOUS | Status: DC
  Administered 2014-04-11: 11:00:00 via TOPICAL
  Filled 2014-04-10: qty 15

## 2014-04-10 SURGICAL SUPPLY — 19 items
BAG URO CATCHER STRL LF (DRAPE) ×4 IMPLANT
BASKET LASER NITINOL 1.9FR (BASKET) IMPLANT
BASKET STONE NCOMPASS (UROLOGICAL SUPPLIES) ×4 IMPLANT
CATH URET 5FR 28IN OPEN ENDED (CATHETERS) ×4 IMPLANT
CLOTH BEACON ORANGE TIMEOUT ST (SAFETY) ×4 IMPLANT
DRAPE CAMERA CLOSED 9X96 (DRAPES) ×4 IMPLANT
EXTRACTOR STONE NITINOL NGAGE (UROLOGICAL SUPPLIES) ×4 IMPLANT
FIBER LASER TRAC TIP (UROLOGICAL SUPPLIES) ×4 IMPLANT
GLOVE SURG SS PI 8.0 STRL IVOR (GLOVE) IMPLANT
GOWN STRL REUS W/TWL XL LVL3 (GOWN DISPOSABLE) ×4 IMPLANT
GUIDEWIRE STR DUAL SENSOR (WIRE) ×4 IMPLANT
IV NS 1000ML (IV SOLUTION) ×2
IV NS 1000ML BAXH (IV SOLUTION) ×2 IMPLANT
MANIFOLD NEPTUNE II (INSTRUMENTS) ×4 IMPLANT
PACK CYSTO (CUSTOM PROCEDURE TRAY) ×4 IMPLANT
SHEATH ACCESS URETERAL 38CM (SHEATH) ×4 IMPLANT
STENT CONTOUR 6FRX24X.038 (STENTS) ×4 IMPLANT
TUBING CONNECTING 10 (TUBING) ×3 IMPLANT
TUBING CONNECTING 10' (TUBING) ×1

## 2014-04-10 NOTE — Interval H&P Note (Signed)
History and Physical Interval Note:  Her recent urine culture was negative.   04/10/2014 10:06 AM  [name]  has presented today for surgery, with the diagnosis of LEFT RENAL STONE  The various methods of treatment have been discussed with the patient and family. After consideration of risks, benefits and other options for treatment, the patient has consented to  Procedure(s): LEFT SECOND STAGE URETEORSCOPY STONE EXTRACTION (Left) as a surgical intervention .  The patient's history has been reviewed, patient examined, no change in status, stable for surgery.  I have reviewed the patient's chart and labs.  Questions were answered to the patient's satisfaction.     [name] [name]

## 2014-04-10 NOTE — Interval H&P Note (Signed)
History and Physical Interval Note:  She returns for a second look ureteroscopy for her left renal stone.      04/10/2014 11:07 AM  [name] [name]  has presented today for surgery, with the diagnosis of LEFT RENAL STONE  The various methods of treatment have been discussed with the patient and family. After consideration of risks, benefits and other options for treatment, the patient has consented to  Procedure(s): LEFT SECOND STAGE URETEORSCOPY STONE EXTRACTION (Left) as a surgical intervention .  The patient's history has been reviewed, patient examined, no change in status, stable for surgery.  I have reviewed the patient's chart and labs.  Questions were answered to the patient's satisfaction.     Malka So

## 2014-04-10 NOTE — Transfer of Care (Signed)
Immediate Anesthesia Transfer of Care Note  Patient: [name] [name]  Procedure(s) Performed: Procedure(s): LEFT SECOND STAGE URETEORSCOPY STONE EXTRACTION (Left)  Patient Location: PACU  Anesthesia Type:General  Level of Consciousness: awake and patient cooperative  Airway & Oxygen Therapy: Patient Spontanous Breathing and Patient connected to face mask oxygen  Post-op Assessment: Report given to PACU RN, Post -op Vital signs reviewed and stable and Patient moving all extremities  Post vital signs: Reviewed and stable  Complications: No apparent anesthesia complications

## 2014-04-10 NOTE — Anesthesia Preprocedure Evaluation (Signed)
Anesthesia Evaluation  Patient identified by MRN, date of birth, ID band Patient awake    Reviewed: Allergy & Precautions, H&P , NPO status , Patient's Chart, lab work & pertinent test results  Airway Mallampati: II TM Distance: >3 FB Neck ROM: Full    Dental no notable dental hx.    Pulmonary neg pulmonary ROS,  breath sounds clear to auscultation  Pulmonary exam normal       Cardiovascular hypertension, + CAD Rhythm:Regular Rate:Normal     Neuro/Psych PSYCHIATRIC DISORDERS dementianegative neurological ROS     GI/Hepatic Neg liver ROS, PUD, GERD-  Medicated,  Endo/Other  negative endocrine ROSdiabetes  Renal/GU Renal InsufficiencyRenal disease  negative genitourinary   Musculoskeletal negative musculoskeletal ROS (+)   Abdominal   Peds negative pediatric ROS (+)  Hematology negative hematology ROS (+)   Anesthesia Other Findings   Reproductive/Obstetrics negative OB ROS                           Anesthesia Physical Anesthesia Plan  ASA: III  Anesthesia Plan: General   Post-op Pain Management:    Induction: Intravenous  Airway Management Planned: LMA  Additional Equipment:   Intra-op Plan:   Post-operative Plan:   Informed Consent: I have reviewed the patients History and Physical, chart, labs and discussed the procedure including the risks, benefits and alternatives for the proposed anesthesia with the patient or authorized representative who has indicated his/her understanding and acceptance.   Dental advisory given  Plan Discussed with: CRNA and Surgeon  Anesthesia Plan Comments:         Anesthesia Quick Evaluation

## 2014-04-10 NOTE — H&P (View-Only) (Signed)
tive Problems  1. Bilateral kidney stones (592.0)  2. History of urinary tract infection (V13.02)  3. Renal cyst, acquired (593.2)  4. Urinary incontinence without sensory awareness (788.34)  History of Present Illness   [name] [name] returns today in f/u for her history of renal stones and UTI's.   She had been on amoxicillin 250mg  nightly for suppression but she apparently hasn't been on it recently.  She has had several visits to the ER for some breathing issues and she has been on antibiotics and steroids for that.  She hasn't had any documented UTI's since last year.    She had a 2mm LLP stone on KUB prior to her last visit with me in 1/14 but has had a couple of CT's since with the last in 12/14 that shows an increase in the size of the stone which had moved to the renal pelvis.   She has a complex cystic mass on the RLP with calcium deposits but that has been stable.  A KUB prior to this visit now shows that the stone is 21 x 60mm on the left the right calcified cyst is stable.  She started having some LLQ pain intermittantly for 6 months.  The pain is not severe.  It is worse with transfers.   She has no dysuria or hematuria.  She has persistent incontinence requiring depends.   Past Medical History  1. History of Alzheimer's disease (331.0)  2. History of Anxiety (300.00)  3. History of Arthritis (V13.4)  4. History of Gastric ulcer (531.90)  5. History of diabetes mellitus (V12.29)  6. History of esophageal reflux (V12.79)  7. History of hypercholesterolemia (V12.29)  8. History of hypertension (V12.59)  9. History of urinary tract infection (V13.02)  10. History of Vocal cord dysfunction (478.5)  Surgical History  1. History of Cholecystectomy  2. History of Cystoscopy With Ureteroscopy  3. History of Hysterectomy  4. History of Knee Replacement  5. History of Lithotripsy  6. History of Neck Surgery  Current Meds  1. Acetaminophen 500 MG Oral Tablet;  Therapy:  (Recorded:21Mar2013) to Recorded  2. Aricept 5 MG Oral Tablet;  Therapy: (Recorded:10Apr2015) to Recorded  3. Artificial Tears SOLN;  Therapy: (Recorded:19Jul2013) to Recorded  4. Aspirin 81 MG Oral Tablet Chewable;  Therapy: (Recorded:19Jul2013) to Recorded  5. Atropine Sulfate 1 % Ophthalmic Solution;  Therapy: [phone] to Recorded  6. Benicar 20 MG Oral Tablet;  Therapy: (Recorded:10Apr2015) to Recorded  7. Boniva 150 MG Oral Tablet;  Therapy: (Recorded:21Mar2013) to Recorded  8. Carafate 1 GM Oral Tablet;  Therapy: (Recorded:10Apr2015) to Recorded  9. Citalopram Hydrobromide 20 MG Oral Tablet;  Therapy: 01Oct2012 to Recorded  10. Claritin 10 MG Oral Tablet;   Therapy: (Recorded:10Apr2015) to Recorded  11. Docusate Sodium 100 MG Oral Capsule;   Therapy: (Recorded:21Mar2013) to Recorded  12. Durezol 0.05 % Ophthalmic Emulsion;   Therapy: [phone] to Recorded  13. Elocon 0.1 % External Cream;   Therapy: (Recorded:19Jul2013) to Recorded  14. Insta-Glucose GEL;   Therapy: (Recorded:19Jul2013) to Recorded  15. Losartan Potassium 25 MG Oral Tablet;   Therapy: 01Oct2012 to Recorded  16. MiraLax POWD;   Therapy: (Recorded:10Apr2015) to Recorded  17. Mysoline 50 MG Oral Tablet;   Therapy: (Recorded:10Apr2015) to Recorded  18. Namenda 10 MG Oral Tablet;   Therapy: 11Oct2012 to Recorded  19. Omeprazole 40 MG Oral Capsule Delayed Release;   Therapy: 28Dec2012 to Recorded  20. Sucralfate 1 GM Oral Tablet;   Therapy: 23Nov2012 to Recorded  21.  Vitamin B Complex Oral Tablet;   Therapy: (Recorded:21Mar2013) to Recorded  22. Vitamin C TABS;   Therapy: (Recorded:21Mar2013) to Recorded  23. Vitamin E 400 UNIT Oral Capsule;   Therapy: (Recorded:19Jul2013) to Recorded  24. Zofran 4 MG Oral Tablet;   Therapy: (Recorded:19Jul2013) to Recorded  Allergies  1. Codeine Derivatives  2. Morphine Derivatives  Family History  1. Family history of Death In The Family Father  2. Family  history of Death In The Family Mother  3. Family history of Diabetes Mellitus (V18.0)  4. Family history of Family Health Status Number Of Children  5. Family history of Stroke Syndrome (V17.1) : Mother  Social History  1. Denied: History of Alcohol Use  2. Denied: History of Caffeine Use  3. Marital History - Widowed  4. Never A Smoker  5. Retired From Work      Past, family and social history reviewed and updated.   She lives and [employer].   Review of Systems Genitourinary, constitutional, skin, eye, otolaryngeal, hematologic/lymphatic, cardiovascular, pulmonary, endocrine, musculoskeletal, gastrointestinal, neurological and psychiatric system(s) were reviewed and pertinent findings if present are noted.  Genitourinary: incontinence, but no hematuria.  Gastrointestinal: flank pain, but no diarrhea and no constipation.  Constitutional: no fever.  Cardiovascular: leg swelling (mild off of lasix), but no chest pain.  Respiratory: wheezing, but no shortness of breath.  Neurological: memory loss.    Vitals Vital Signs [Data Includes: Last 1 Day]  Recorded: 10Apr2015 03:26PM  Blood Pressure: 110 / 70 Temperature: 98 F Heart Rate: 80  Physical Exam Constitutional: Well nourished and well developed . No acute distress.  ENT:. The ears and nose are normal in appearance.  Neck: The appearance of the neck is normal and no neck mass is present.  Pulmonary: No respiratory distress and normal respiratory rhythm and effort. Diffuse wheezing heard on ascultation.  Cardiovascular: Heart rate and rhythm are normal. with mild ankle edema.  Abdomen: The abdomen is obese. The abdomen is soft and nontender (except for mild LLQ tenderness. ). No masses are palpated. No CVA tenderness. No hernias are palpable. No hepatosplenomegaly noted.  Lymphatics: The supraclavicular, femoral and inguinal nodes are not enlarged or tender.  Skin: Normal skin turgor, no visible rash and no visible  skin lesions.  She has poor memory and limited response to questions.    Results/Data Urine [Data Includes: Last 1 Day]   [id number]  COLOR YELLOW   APPEARANCE CLEAR   SPECIFIC GRAVITY >1.030   pH 5.0   GLUCOSE NEG mg/dL  BILIRUBIN NEG   KETONE NEG mg/dL  BLOOD NEG   PROTEIN NEG mg/dL  UROBILINOGEN 0.2 mg/dL  NITRITE NEG   LEUKOCYTE ESTERASE NEG    The following images/tracing/specimen were independently visualized:  I have reviwed her recent KUB films and report and compared prior films.    Procedure  CAth Specimen was obtained.     Assessment  1. Bilateral kidney stones (592.0)  2. Renal cyst, acquired (593.2)  3. Urinary incontinence without sensory awareness (788.34)  4. History of urinary tract infection (V13.02)   She has progressive enlargement of the left renal stone on KUB and it had moved to the renal pelvic on her most recent CT.   Her pain may or may not be related. The renal cyst is complex but stable and in her condition further treatment is not really warranted. Her UA today is clear. She has persistent incontinence.   Plan Bilateral kidney stones   1. CT-ABD/PELVIS  W/O CONTRAST; Status:Hold For - Appointment,PreCert,Date of  Service,Print; Requested for:10Apr2015;  PMH: History of urinary tract infection   2. Cath for Specimen [facility]); Status:Hold For - Appointment,Date of Service; Requested  for:10Apr2015;   3. URINE CULTURE; Status:Hold For - Specimen/Data Collection,Appointment; Requested  for:10Apr2015;  Urinary incontinence without sensory awareness   4. UA With REFLEX; [Do Not Release]; Status:Hold For - [employer];  Requested IFO:[id number];    I am going to get a CT stone study to further assess the stone before considering therapy.    She may need stenting and ESWL vs a percutaneous nephrolithotomy. I am not going to resume the amoxicillin.  I will call with the CT result and arrange f/u accordingly.   Discussion/Summary   CC:  Dr. Sallee Lange.

## 2014-04-10 NOTE — Brief Op Note (Signed)
04/10/2014  3:45 PM  PATIENT:  [name] [name]  80 y.o. female  PRE-OPERATIVE DIAGNOSIS:  LEFT RENAL STONE  POST-OPERATIVE DIAGNOSIS:  LEFT RENAL STONE  PROCEDURE:  Procedure(s): LEFT SECOND STAGE URETEORSCOPY STONE EXTRACTION (Left)  SURGEON:  Surgeon(s) and Role:    * Malka So, MD - Primary  PHYSICIAN ASSISTANT:   ASSISTANTS: none   ANESTHESIA:   general  EBL:  Total I/O In: 550 [I.V.:550] Out: -   BLOOD ADMINISTERED:none  DRAINS: 6 x 24 left JJ stent   LOCAL MEDICATIONS USED:  NONE  SPECIMEN:  Source of Specimen:  left renal stones  DISPOSITION OF SPECIMEN:  to family  COUNTS:  YES  TOURNIQUET:  * No tourniquets in log *  DICTATION: .Other Dictation: Dictation Number [id number]  PLAN OF CARE: Admit for overnight observation  PATIENT DISPOSITION:  PACU - hemodynamically stable.   Delay start of Pharmacological VTE agent (>24hrs) due to surgical blood loss or risk of bleeding: yes

## 2014-04-10 NOTE — Anesthesia Postprocedure Evaluation (Signed)
  Anesthesia Post-op Note  Patient: [name] [name]  Procedure(s) Performed: Procedure(s) (LRB): LEFT SECOND STAGE URETEORSCOPY STONE EXTRACTION (Left)  Patient Location: PACU  Anesthesia Type: General  Level of Consciousness: awake and alert   Airway and Oxygen Therapy: Patient Spontanous Breathing  Post-op Pain: mild  Post-op Assessment: Post-op Vital signs reviewed, Patient's Cardiovascular Status Stable, Respiratory Function Stable, Patent Airway and No signs of Nausea or vomiting  Last Vitals:  Filed Vitals:   04/10/14 1412  BP:   Pulse: 70  Temp: 36.5 C  Resp:     Post-op Vital Signs: stable   Complications: No apparent anesthesia complications

## 2014-04-10 NOTE — Discharge Instructions (Signed)

## 2014-04-11 ENCOUNTER — Encounter (HOSPITAL_COMMUNITY): Payer: Self-pay | Admitting: Urology

## 2014-04-11 ENCOUNTER — Ambulatory Visit: Payer: Medicare Other | Admitting: Family Medicine

## 2014-04-11 DIAGNOSIS — F0390 Unspecified dementia without behavioral disturbance: Secondary | ICD-10-CM | POA: Diagnosis not present

## 2014-04-11 DIAGNOSIS — N209 Urinary calculus, unspecified: Secondary | ICD-10-CM | POA: Diagnosis not present

## 2014-04-11 MED ORDER — PNEUMOCOCCAL VAC POLYVALENT 25 MCG/0.5ML IJ INJ
0.5000 mL | INJECTION | Freq: Once | INTRAMUSCULAR | Status: DC
  Filled 2014-04-11: qty 0.5

## 2014-04-11 NOTE — Progress Notes (Signed)
Pt inc small amt yellow urine. Bladder scan=>361ml.  Dr Jeffie Pollock on phone, made aware. Orders to monitor pt for next void and bladder scan in 3 hours.

## 2014-04-11 NOTE — Progress Notes (Signed)
Patient ID: [name] [name], female   DOB: September 06, 1924, 80 y.o.   MRN: [id number] Post-op note  Subjective: The patient is doing well.  Non verbal.  VSS overnight.  No UO after foley removed yesterday afternoon at 1pm until I was paged at 12pm last night.  Foley placed and IVF increased.  Night RN did not record UO with foley placement. UO improved rest of night.  Pt is non mobile and unable to perform IS.    Objective: Vital signs in last 24 hours: Temp:  [97.5 F (36.4 C)-98.5 F (36.9 C)] 98.5 F (36.9 C) (05/22 0640) Pulse Rate:  [70-80] 72 (05/22 0640) Resp:  [15-20] 20 (05/22 0640) BP: (137-160)/(56-69) 137/59 mmHg (05/22 0640) SpO2:  [94 %-100 %] 98 % (05/22 0640) Weight:  [79.635 kg (175 lb 9 oz)] 79.635 kg (175 lb 9 oz) (05/21 0947)  Intake/Output from previous day: 05/21 0701 - 05/22 0700 In: 1916.7 [P.O.:120; I.V.:1596.7; IV Piggyback:200] Out: 850 [Urine:850] Intake/Output this shift:    Physical Exam:  General: Alert and oriented. Abdomen: Soft, Nondistended. Incisions: Clean and dry. Urine: clear/yellow  Lab Results:  Recent Labs  04/10/14 1000  HGB 12.7  HCT 39.4    Assessment/Plan: POD#0   1) Continue to monitor  2) D/c foley and monitor UO.  Pt is incontinent per daughter.  Perform bladder scan after 3 hours to ensure bladder is emptying.  3) continue IVF for now  4) If UO ok and no retention will d/c home later.    LOS: 1 day   Marcie Bal 04/11/2014, 8:15 AM

## 2014-04-11 NOTE — Care Management Note (Addendum)
    Page 1 of 1   04/11/2014     3:54:19 PM CARE MANAGEMENT NOTE 04/11/2014  Patient:  [name] [name], [name] [name]   Account Number:  [id number]  Date Initiated:  04/11/2014  Documentation initiated by:  Dessa Phi  Subjective/Objective Assessment:   80 Y/O F ADMITTED W/RENAL STONE.W/C BOUND.     Action/Plan:   FROM [facility].   Anticipated DC Date:  04/11/2014   Anticipated DC Plan:  ASSISTED LIVING / [place]  CM consult      Choice offered to / List presented to:             Status of service:  Completed, signed off Medicare Important Message given?   (If response is "NO", the following Medicare IM given date fields will be blank) Date Medicare IM given:   Date Additional Medicare IM given:    Discharge Disposition:  ASSISTED LIVING  Per UR Regulation:  Reviewed for med. necessity/level of care/duration of stay  If discussed at Long Length of Stay Meetings, dates discussed:    Comments:  04/11/14 Allix Blomquist RN,BSN NCM [phone] NO ANTICIPATED D/C NEEDS.

## 2014-04-11 NOTE — Progress Notes (Signed)
Pt aware of pt's bladder scan post incontinence >461ml. Orders to st cath and call with results. Pt straight cath w/ aseptic technique for 348ml pale yellow urine. Dr Jeffie Pollock aware and states to d/c pt home w/o foley.

## 2014-04-11 NOTE — Progress Notes (Signed)
Report called to [place] at [employer] [employer]. She requests script for Keflex be faxed to [phone] so she can fill it for pt prior to her return home. D/C instructions reviewed w/ pt dtr at bedside. All questions answered, no further questions. Pt awaiting PtAR for transfer back to ALF.

## 2014-04-11 NOTE — Op Note (Signed)
[name], [name] [name] NO.:  [id number]  MEDICAL RECORD NO.:  [id number]  LOCATION:  1540                         FACILITY:  [facility]  PHYSICIAN:  Marshall Cork. Jeffie Pollock, M.D.    DATE OF BIRTH:  09-12-1924  DATE OF PROCEDURE:  04/10/2014 DATE OF DISCHARGE:                              OPERATIVE REPORT   PROCEDURE: 1. Cystoscopy with removal of left double-J stent. 2. Left ureteroscopic stone extraction with holmium laser lithotripsy     and placement of stent.  PREOPERATIVE DIAGNOSIS:  Left renal stone.  POSTOPERATIVE DIAGNOSIS:  Left renal stone.  SURGEON:  Marshall Cork. Jeffie Pollock, M.D.  ANESTHESIA:  General.  SPECIMEN:  Stone fragments.  BLOOD LOSS:  Minimal.  DRAINS:  A 6-French 24 cm double-J stent.  COMPLICATIONS:  None.  INDICATIONS:  [name] [name] is an 80 year old, white female, who had a 1.5 cm radiolucent left renal stone and underwent a ureteroscopy approximately 2 weeks ago with laser lithotripsy to the stone.  She had a relative UPJ obstruction.  I was unable to retrieve fragments, so stent was placed and she returns now for definitive stone removal.  FINDINGS OF PROCEDURE:  She was given 2 g of Ancef.  She was taken to the operating room where general anesthetic was induced.  She was placed in lithotomy position and fitted with PAS hose.  Her perineum and genitalia were prepped with Betadine solution.  She was draped in usual sterile fashion.  A 22-French cystoscope was passed with a 12-degree lens.  The distal limb of the left ureteral stent was identified, grasped, pulled with the urethral meatus and a wire was passed to the kidney.  The stent was removed.  A 12 x 14 x 38 cm digital access sheath was then inserted over the wire to the kidney without difficulty.  The wire was removed and contrast was instilled the insuring position in the collecting system.  The inner core was then removed and an 8-French Storz digital flexible ureteroscope was passed  through the sheath to the kidney.  Once in the kidney, identified multiple fragments of the radiolucent stone.  These were initially removed with engage basket with 50 or more passes to remove most of the significant size fragments.  I then passed a 200 micron laser fiber, and septal laser on 0.5 watts and 50 hertz and fragmented the remaining fragments that were too large to remove intact.  Once this had been performed, additional fragments were removed with the engage basket and then the residual small fragments were removed to the best of my ability within in compass basket.  At the end of the procedure, a little bit of dust was left that was too small to be retrieved with the basket, but all major fragments were removed.  At this point, inspection revealed a little bit of false passage immediately at the UPJ, but I was able to easily get in and out of the internal collecting system.  The wire was replaced through the ureteroscope to the kidney.  The ureteroscope and sheath were then removed and the cystoscope was replaced over the wire.  A 6-French 24 cm double-J stent with no string was advanced over  the wire to the kidney under fluoroscopic guidance.  The wire was removed leaving good coil in the kidney, a good coil in the bladder.  The bladder was then drained. The patient was taken down from lithotomy position.  Her anesthetic was reversed.  She was moved to recovery in stable condition.  There were no complications.     Marshall Cork. Jeffie Pollock, M.D.     JJW/MEDQ  D:  04/10/2014  T:  04/11/2014  Job:  [id number]

## 2014-04-11 NOTE — Progress Notes (Signed)
Pt d/c to [facility] by PTAR at this time. Pt dressed in own clothes, transferred w/ transfer packet, script, and all personal belongings. In stable condition at d/c. Dtr bedside and aware of transfer.

## 2014-04-11 NOTE — Progress Notes (Signed)
Patient is set to discharge back to [facility] today. CSW confirmed with Estill Bamberg @ ALF that they would be able to take patient back today. Patient & daughter at bedside aware. Discharge packet in [place], [place] aware. PTAR called for transport.   Raynaldo Opitz, [facility] Clinical Social Worker cell #: [phone]

## 2014-04-12 DIAGNOSIS — N200 Calculus of kidney: Secondary | ICD-10-CM | POA: Diagnosis not present

## 2014-04-12 DIAGNOSIS — F0280 Dementia in other diseases classified elsewhere without behavioral disturbance: Secondary | ICD-10-CM | POA: Diagnosis not present

## 2014-04-12 DIAGNOSIS — G309 Alzheimer's disease, unspecified: Secondary | ICD-10-CM | POA: Diagnosis not present

## 2014-04-12 DIAGNOSIS — E119 Type 2 diabetes mellitus without complications: Secondary | ICD-10-CM | POA: Diagnosis not present

## 2014-04-12 DIAGNOSIS — M47812 Spondylosis without myelopathy or radiculopathy, cervical region: Secondary | ICD-10-CM | POA: Diagnosis not present

## 2014-04-12 DIAGNOSIS — N390 Urinary tract infection, site not specified: Secondary | ICD-10-CM | POA: Diagnosis not present

## 2014-04-12 DIAGNOSIS — M810 Age-related osteoporosis without current pathological fracture: Secondary | ICD-10-CM | POA: Diagnosis not present

## 2014-04-14 NOTE — Telephone Encounter (Signed)
Most insurances do not pay for nursing home care unless the patient has long-term health insurance specifically for nursing home. Medicare does not provide nursing home coverage. They could stay is actually where they are at with a home health nurse. If her care becomes more intense they may choose to go toward a nursing home and in order to do that they would need to fill in a new [place]-2 form. In addition to that they would also have to look in to see if they might qualify for Medicaid to help her the in the nursing home. As long as her healthcare needs are being met where she is that she does not have to move at this point.

## 2014-04-15 ENCOUNTER — Telehealth: Payer: Self-pay | Admitting: Family Medicine

## 2014-04-15 DIAGNOSIS — M810 Age-related osteoporosis without current pathological fracture: Secondary | ICD-10-CM | POA: Diagnosis not present

## 2014-04-15 DIAGNOSIS — E119 Type 2 diabetes mellitus without complications: Secondary | ICD-10-CM | POA: Diagnosis not present

## 2014-04-15 DIAGNOSIS — N200 Calculus of kidney: Secondary | ICD-10-CM | POA: Diagnosis not present

## 2014-04-15 DIAGNOSIS — N390 Urinary tract infection, site not specified: Secondary | ICD-10-CM | POA: Diagnosis not present

## 2014-04-15 DIAGNOSIS — F0280 Dementia in other diseases classified elsewhere without behavioral disturbance: Secondary | ICD-10-CM | POA: Diagnosis not present

## 2014-04-15 DIAGNOSIS — M47812 Spondylosis without myelopathy or radiculopathy, cervical region: Secondary | ICD-10-CM | POA: Diagnosis not present

## 2014-04-15 NOTE — Telephone Encounter (Signed)
Alyse Low was calling to say that it was OK for [facility] to start occupational therapy on the patient after her surgery. She said she is going to call [facility] again today to see if they still needed that order.

## 2014-04-15 NOTE — Telephone Encounter (Signed)
error 

## 2014-04-17 DIAGNOSIS — M810 Age-related osteoporosis without current pathological fracture: Secondary | ICD-10-CM

## 2014-04-17 DIAGNOSIS — N390 Urinary tract infection, site not specified: Secondary | ICD-10-CM

## 2014-04-17 DIAGNOSIS — M47812 Spondylosis without myelopathy or radiculopathy, cervical region: Secondary | ICD-10-CM

## 2014-04-17 DIAGNOSIS — E119 Type 2 diabetes mellitus without complications: Secondary | ICD-10-CM | POA: Diagnosis not present

## 2014-04-17 DIAGNOSIS — F0280 Dementia in other diseases classified elsewhere without behavioral disturbance: Secondary | ICD-10-CM | POA: Diagnosis not present

## 2014-04-17 DIAGNOSIS — G309 Alzheimer's disease, unspecified: Secondary | ICD-10-CM | POA: Diagnosis not present

## 2014-04-17 DIAGNOSIS — N200 Calculus of kidney: Secondary | ICD-10-CM | POA: Diagnosis not present

## 2014-04-17 IMAGING — CR DG CHEST 1V PORT
1 series · 1 of 1 positions shown · non-contrast
Comparison: 10/31/2013

CLINICAL DATA: Wheezing.

EXAM:
PORTABLE CHEST - 1 VIEW

[portable]
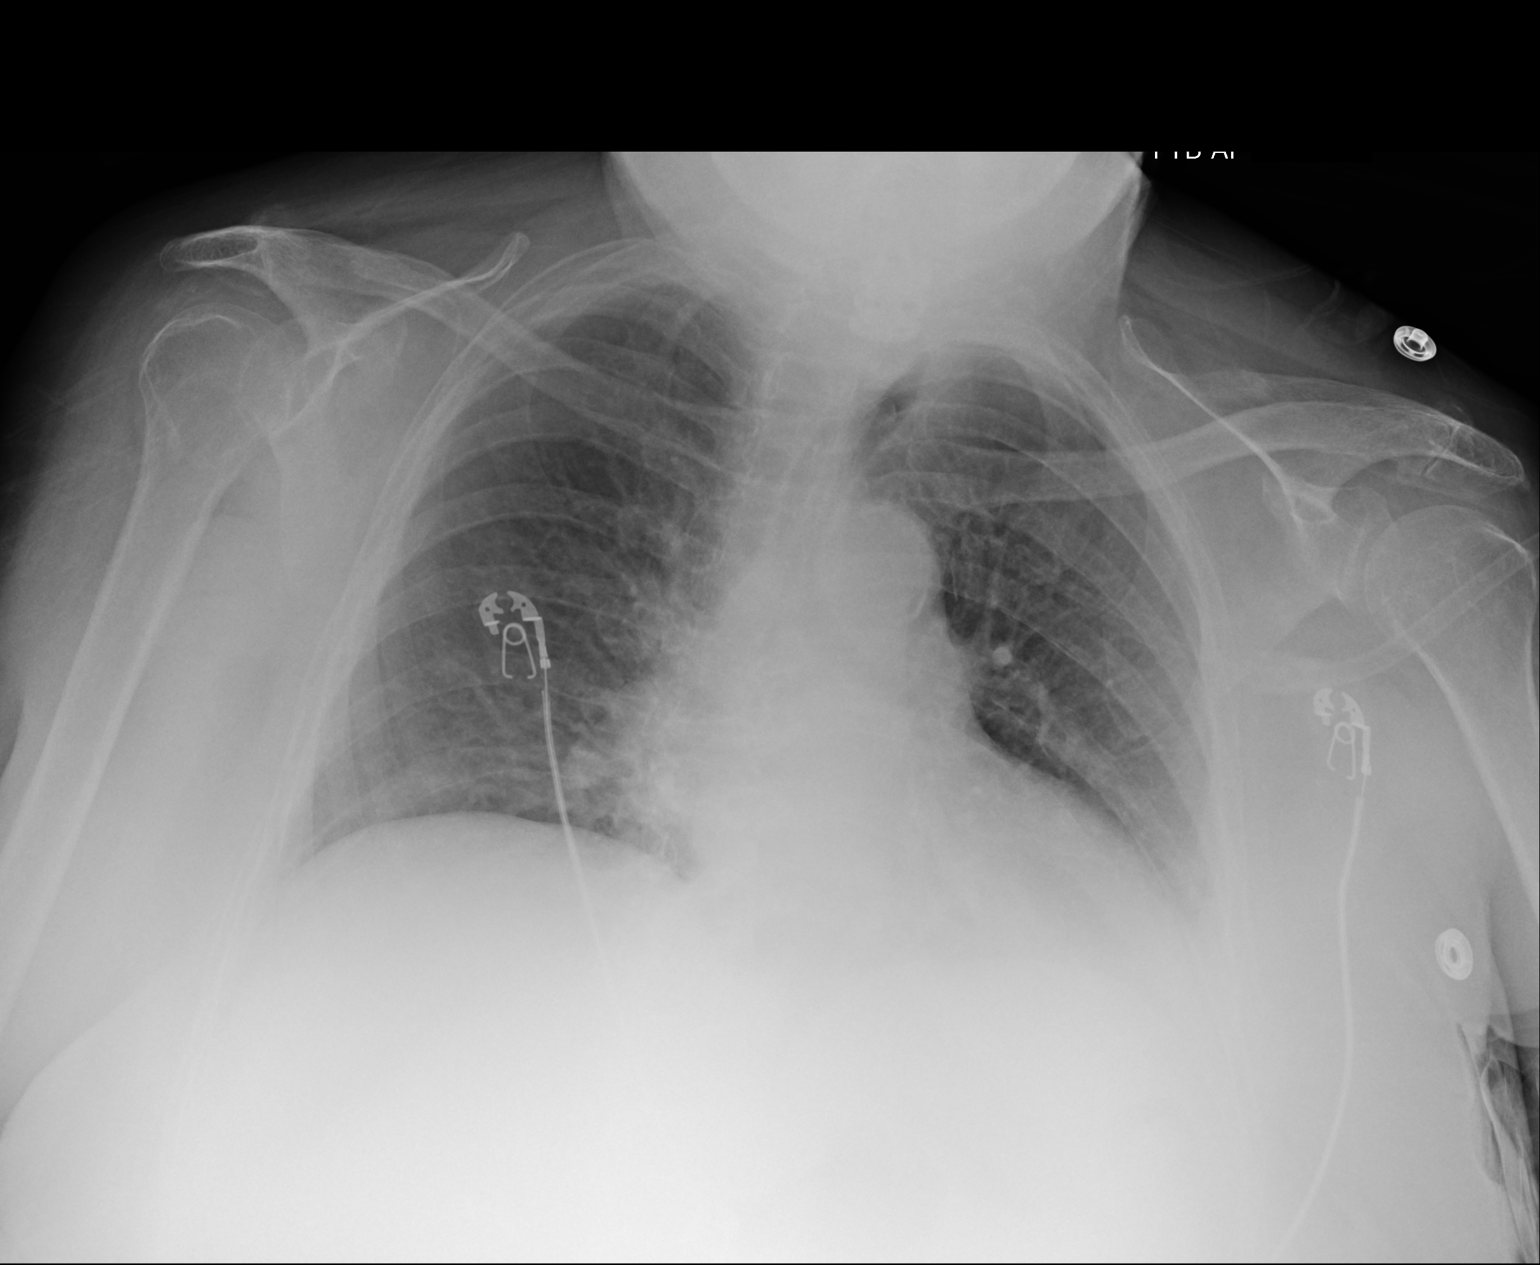

[1 of 1 positions shown; findings below may reference images not displayed]

FINDINGS: Low lung volumes. Perihilar airspace disease and bibasilar
opacities. This could reflect atelectasis or early edema. No
effusions. No acute bony abnormality. Heart is borderline in size.
IMPRESSION: Low lung volumes with perihilar and lower lobe opacities, question
edema versus atelectasis.

## 2014-04-18 ENCOUNTER — Other Ambulatory Visit: Payer: Self-pay | Admitting: Urology

## 2014-04-18 ENCOUNTER — Ambulatory Visit (INDEPENDENT_AMBULATORY_CARE_PROVIDER_SITE_OTHER): Payer: Medicare Other | Admitting: Urology

## 2014-04-18 DIAGNOSIS — N200 Calculus of kidney: Secondary | ICD-10-CM

## 2014-04-18 DIAGNOSIS — Z87440 Personal history of urinary (tract) infections: Secondary | ICD-10-CM | POA: Diagnosis not present

## 2014-04-21 ENCOUNTER — Other Ambulatory Visit: Payer: Self-pay | Admitting: Family Medicine

## 2014-04-22 ENCOUNTER — Other Ambulatory Visit: Payer: Self-pay | Admitting: Family Medicine

## 2014-04-22 DIAGNOSIS — M47812 Spondylosis without myelopathy or radiculopathy, cervical region: Secondary | ICD-10-CM | POA: Diagnosis not present

## 2014-04-22 DIAGNOSIS — M810 Age-related osteoporosis without current pathological fracture: Secondary | ICD-10-CM | POA: Diagnosis not present

## 2014-04-22 DIAGNOSIS — F0280 Dementia in other diseases classified elsewhere without behavioral disturbance: Secondary | ICD-10-CM | POA: Diagnosis not present

## 2014-04-22 DIAGNOSIS — E119 Type 2 diabetes mellitus without complications: Secondary | ICD-10-CM | POA: Diagnosis not present

## 2014-04-22 DIAGNOSIS — N200 Calculus of kidney: Secondary | ICD-10-CM | POA: Diagnosis not present

## 2014-04-22 DIAGNOSIS — N390 Urinary tract infection, site not specified: Secondary | ICD-10-CM | POA: Diagnosis not present

## 2014-04-23 DIAGNOSIS — E1159 Type 2 diabetes mellitus with other circulatory complications: Secondary | ICD-10-CM | POA: Diagnosis not present

## 2014-04-24 DIAGNOSIS — G309 Alzheimer's disease, unspecified: Secondary | ICD-10-CM | POA: Diagnosis not present

## 2014-04-24 DIAGNOSIS — M47812 Spondylosis without myelopathy or radiculopathy, cervical region: Secondary | ICD-10-CM | POA: Diagnosis not present

## 2014-04-24 DIAGNOSIS — E119 Type 2 diabetes mellitus without complications: Secondary | ICD-10-CM | POA: Diagnosis not present

## 2014-04-24 DIAGNOSIS — N390 Urinary tract infection, site not specified: Secondary | ICD-10-CM | POA: Diagnosis not present

## 2014-04-24 DIAGNOSIS — M810 Age-related osteoporosis without current pathological fracture: Secondary | ICD-10-CM | POA: Diagnosis not present

## 2014-04-24 DIAGNOSIS — F0280 Dementia in other diseases classified elsewhere without behavioral disturbance: Secondary | ICD-10-CM | POA: Diagnosis not present

## 2014-04-24 DIAGNOSIS — N200 Calculus of kidney: Secondary | ICD-10-CM | POA: Diagnosis not present

## 2014-04-25 ENCOUNTER — Encounter: Payer: Self-pay | Admitting: Family Medicine

## 2014-04-25 ENCOUNTER — Ambulatory Visit (INDEPENDENT_AMBULATORY_CARE_PROVIDER_SITE_OTHER): Payer: Medicare Other | Admitting: Family Medicine

## 2014-04-25 VITALS — BP 118/72 | Ht 61.0 in | Wt 175.0 lb

## 2014-04-25 DIAGNOSIS — F0280 Dementia in other diseases classified elsewhere without behavioral disturbance: Secondary | ICD-10-CM | POA: Diagnosis not present

## 2014-04-25 DIAGNOSIS — R7309 Other abnormal glucose: Secondary | ICD-10-CM | POA: Diagnosis not present

## 2014-04-25 DIAGNOSIS — I10 Essential (primary) hypertension: Secondary | ICD-10-CM

## 2014-04-25 DIAGNOSIS — G309 Alzheimer's disease, unspecified: Secondary | ICD-10-CM | POA: Diagnosis not present

## 2014-04-25 DIAGNOSIS — R739 Hyperglycemia, unspecified: Secondary | ICD-10-CM

## 2014-04-25 DIAGNOSIS — L83 Acanthosis nigricans: Secondary | ICD-10-CM | POA: Diagnosis not present

## 2014-04-25 LAB — POCT GLYCOSYLATED HEMOGLOBIN (HGB A1C): HEMOGLOBIN A1C: 6.4

## 2014-04-25 NOTE — Progress Notes (Signed)
   Subjective:    Patient ID: [name] [name], female    DOB: October 08, 1924, 80 y.o.   MRN: [id number]  HPI Patient is here today for a check up.  Family would like for you to check her right arm. There is a raised bump. The bump appears benign but we will discuss his case with dermatology  Would also like for you to check her left ankle for skin discoloration. Family is concerned about this area there is no sign of any cyanosis or circulation issues.  Patient has been issues does not one of either self often requires others to feed her patient at times more confused than other times    Review of Systems Patient unable to give a review of systems to her Alzheimer's    Objective:   Physical Exam  Vitals reviewed. Constitutional: She appears well-nourished. No distress.  Cardiovascular: Normal rate, regular rhythm and normal heart sounds.   No murmur heard. Pulmonary/Chest: Effort normal and breath sounds normal. No respiratory distress.  Musculoskeletal: She exhibits no edema.  Lymphadenopathy:    She has no cervical adenopathy.  Neurological: She is alert. She exhibits normal muscle tone.  Psychiatric: Her behavior is normal.          Assessment & Plan:  HTN-her blood pressure overall his knees and on recheck. No need to adjust.  Hyperglycemia-A1c under very good control. We will continue encouraging diabetic diet  Feeding/Alz-she has progressive Alzheimer's this is affecting her feedings. Family overall grasp that things are changing she still sees neurology on a regular basis and they have her on several medicines to try to slow the progression of the disease but this patient has fairly profound disease long-term outlook guarded  Arm lesion she hasn't arm lesion in posterior appear most likely benign but I will go ahead and talk with dermatology about it

## 2014-04-28 DIAGNOSIS — N200 Calculus of kidney: Secondary | ICD-10-CM | POA: Diagnosis not present

## 2014-04-28 DIAGNOSIS — M47812 Spondylosis without myelopathy or radiculopathy, cervical region: Secondary | ICD-10-CM | POA: Diagnosis not present

## 2014-04-28 DIAGNOSIS — M810 Age-related osteoporosis without current pathological fracture: Secondary | ICD-10-CM | POA: Diagnosis not present

## 2014-04-28 DIAGNOSIS — F0280 Dementia in other diseases classified elsewhere without behavioral disturbance: Secondary | ICD-10-CM | POA: Diagnosis not present

## 2014-04-28 DIAGNOSIS — E119 Type 2 diabetes mellitus without complications: Secondary | ICD-10-CM | POA: Diagnosis not present

## 2014-04-28 DIAGNOSIS — N390 Urinary tract infection, site not specified: Secondary | ICD-10-CM | POA: Diagnosis not present

## 2014-05-01 DIAGNOSIS — E119 Type 2 diabetes mellitus without complications: Secondary | ICD-10-CM | POA: Diagnosis not present

## 2014-05-01 DIAGNOSIS — N200 Calculus of kidney: Secondary | ICD-10-CM | POA: Diagnosis not present

## 2014-05-01 DIAGNOSIS — F0280 Dementia in other diseases classified elsewhere without behavioral disturbance: Secondary | ICD-10-CM | POA: Diagnosis not present

## 2014-05-01 DIAGNOSIS — M47812 Spondylosis without myelopathy or radiculopathy, cervical region: Secondary | ICD-10-CM | POA: Diagnosis not present

## 2014-05-01 DIAGNOSIS — G309 Alzheimer's disease, unspecified: Secondary | ICD-10-CM | POA: Diagnosis not present

## 2014-05-01 DIAGNOSIS — M810 Age-related osteoporosis without current pathological fracture: Secondary | ICD-10-CM | POA: Diagnosis not present

## 2014-05-01 DIAGNOSIS — N390 Urinary tract infection, site not specified: Secondary | ICD-10-CM | POA: Diagnosis not present

## 2014-05-02 DIAGNOSIS — H01009 Unspecified blepharitis unspecified eye, unspecified eyelid: Secondary | ICD-10-CM | POA: Diagnosis not present

## 2014-05-09 ENCOUNTER — Ambulatory Visit (HOSPITAL_COMMUNITY)
Admission: RE | Admit: 2014-05-09 | Discharge: 2014-05-09 | Disposition: A | Discharge status: Home / Self Care | Payer: Medicare Other | Source: Ambulatory Visit | Attending: Urology | Admitting: Urology

## 2014-05-09 DIAGNOSIS — N200 Calculus of kidney: Secondary | ICD-10-CM | POA: Insufficient documentation

## 2014-05-12 ENCOUNTER — Other Ambulatory Visit (HOSPITAL_COMMUNITY): Payer: Self-pay | Admitting: Family Medicine

## 2014-05-13 ENCOUNTER — Other Ambulatory Visit (HOSPITAL_COMMUNITY): Payer: Self-pay | Admitting: Family Medicine

## 2014-05-13 DIAGNOSIS — M810 Age-related osteoporosis without current pathological fracture: Secondary | ICD-10-CM

## 2014-05-13 DIAGNOSIS — N390 Urinary tract infection, site not specified: Secondary | ICD-10-CM | POA: Diagnosis not present

## 2014-05-13 DIAGNOSIS — N200 Calculus of kidney: Secondary | ICD-10-CM | POA: Diagnosis not present

## 2014-05-13 DIAGNOSIS — M47812 Spondylosis without myelopathy or radiculopathy, cervical region: Secondary | ICD-10-CM | POA: Diagnosis not present

## 2014-05-16 ENCOUNTER — Ambulatory Visit (INDEPENDENT_AMBULATORY_CARE_PROVIDER_SITE_OTHER): Payer: Medicare Other | Admitting: Urology

## 2014-05-16 ENCOUNTER — Encounter: Payer: Self-pay | Admitting: Neurology

## 2014-05-16 ENCOUNTER — Ambulatory Visit (INDEPENDENT_AMBULATORY_CARE_PROVIDER_SITE_OTHER): Payer: Medicare Other | Admitting: Neurology

## 2014-05-16 VITALS — BP 134/78 | HR 79 | Ht 61.0 in

## 2014-05-16 DIAGNOSIS — R519 Headache, unspecified: Secondary | ICD-10-CM

## 2014-05-16 DIAGNOSIS — G250 Essential tremor: Secondary | ICD-10-CM | POA: Diagnosis not present

## 2014-05-16 DIAGNOSIS — R51 Headache: Secondary | ICD-10-CM | POA: Diagnosis not present

## 2014-05-16 DIAGNOSIS — F0390 Unspecified dementia without behavioral disturbance: Secondary | ICD-10-CM | POA: Diagnosis not present

## 2014-05-16 DIAGNOSIS — G252 Other specified forms of tremor: Secondary | ICD-10-CM

## 2014-05-16 DIAGNOSIS — N200 Calculus of kidney: Secondary | ICD-10-CM

## 2014-05-16 DIAGNOSIS — N3942 Incontinence without sensory awareness: Secondary | ICD-10-CM | POA: Diagnosis not present

## 2014-05-16 NOTE — Progress Notes (Signed)
Subjective:    Patient ID: [name] [name] is a 80 y.o. female.  HPI    Interim history:   [name] [name] is a very pleasant 80 year old right-handed woman with an underlying medical history of coronary artery disease, hypertension, hyperlipidemia, diabetes, degenerative joint disease, status post right total knee replacement in 2006, upper GI bleed secondary to stress ulcer in 2003, retinal artery branch occlusion of the right eye secondary to embolism suspected, reflux disease with hiatal hernia, osteoporosis, left lower lobe lung nodule, cervical spondylosis, status post discectomy, diverticulosis, and kidney stones, who presents for followup consultation of her advanced dementia, gait disturbance, and tremors consistent with essential tremor. She is accompanied by her granddaughter and her daughter, [name] [name], (both have POA) again today. I last saw her on 12/20/2013, at which her GD reported, that patient was eating well. She was tolerating Aricept 5 mg well, no connection between her diarrhea in December 2014 and she was less agitated. She had intermittent wheezing still. In the interim she was seen by Dr. Chase Caller in pulmonary medicine. She was seen in the emergency room on 01/12/2014 with shortness of breath. She was admitted to the hospital on 03/25/2014 for kidney stones and had lithotripsy on 03/25/2014. She was readmitted to the hospital on 04/10/2014 with stent removal to her ureter and kidney stone removal.  Today, they report that her wheezing is improved and essentially gone. She was taken off of some medications. She continues to be on Namenda 10 mg twice daily and Aricept 5 mg once daily. She is essentially wheelchair-bound. She helps with transfers but otherwise does no longer walk. She has thankfully not fallen recently she does report some intermittent headaches. These seem to be in the frontal area. She is not endorsing any headache currently. She is minimally verbal. This is unchanged  from before. They feel that Mysoline and [name] dose at night continues to help with her tremors. She essentially has no further upper extremity tremors.  I saw her on 07/04/2013, at which time I suggested consideration of placing her back on Aricept. She was just on Namenda at the time. In the interim, she was re-started on Aricept [name]-dose by her primary care physician. It had previously been stopped because of cost and chronic cough as I understand. She developed right wrist cellulitis in October. In December she developed more agitation. On 10/25/2013 she was admitted to the hospital for a day because of nausea, vomiting and diarrhea. She was discharged back to her assisted-living facility. She was readmitted to the hospital on 10/29/2013 because of recurrence of vomiting. She had an EGD on 10/31/2013, which showed a large hiatal hernia but no ulcer or bleeding. It was felt that she had multifactorial abdominal pain and vomiting. Speech therapy was consulted. She was placed on a dysphagia diet. She also had a head CT on 10/29/2013 which I reviewed: Atrophy with small vessel chronic ischemic changes of deep cerebral white matter. Question tiny old lacunar infarct right thalamus. No acute intracranial abnormalities. In addition, personally reviewed the images through the PACS system.  I previously reviewed her hospital records from her admission from 12/5 through 10/26/2013 as well as from her admission from 12/09 to 10/31/13. She was also seen in the emergency room recently on 11/22/2013 for a cough. She was diagnosed with a viral syndrome. She had a chest x-ray which was negative for pneumonia. She was symptomatically treated with albuterol and prednisone.  I saw her on 01/11/2014, at which time I felt she  had advanced dementia, gait disturbance, and tremors consistent with ET and no evidence of parkinsonism.  I first saw her on 12/21/2012 and suggested a trial of Mysoline. She had a fall and was seen in the  emergency room on 06/16/2013. She had no overt injury. She lives at the [facility] in the assisted living facility. She had a head CT which did not show any intracranial hemorrhage, but advanced atrophy. She had a cervical spine CT which did not show any fracture. I originally started her on a very small dose of Mysoline in January, a quarter of a pill and then increase it in February 2 half a pill each night. I discussed with her granddaughter that Mysoline does have a tendency to sedate and cause balance problems but in light of her being stable on the same dose I did not think it was connected to her fall. Mysoline helped her hand tremors and helped her feed herself. She has had dementia for years. Per granddaughter she was originally treated with Aricept and then Namenda was added and for some reason several years ago Aricept was discontinued.   Her Past Medical History Is Significant For: Past Medical History  Diagnosis Date  . ASCVD (arteriosclerotic cardiovascular disease) 2003    Single vessel disease-60% D1  . Hypertension   . Hyperlipidemia   . Leukopenia     and anemia-hematology evaluation was nodiagnostic  . Degenerative joint disease     right TKA in 4/06  . Upper GI bleed     gastric stress ulcer in 2003  . Retinal artery branch occlusion of right eye     Sudden onset of right eye blindness-embolism suspected  . Gastroesophageal reflux disease with hiatal hernia   . Osteoporosis   . Lung nodule     Left lower lobe  . Parkinson's disease     PRESUMED BECAUSE SHE HAS A LOT OF SHAKING - BUT HAS SEEN NEUROLOGIST  AND WAS PUT ON MYSOLINE AND FAMILY HAS SEEN BIG IMPROVEMENT IN THE SHAKING  . Cervical spondylarthritis     S/p discectomy  . Diverticulosis   . Hiatal hernia   . Leukocytosis   . Hyperlipidemia   . Degenerative joint disease   . GI bleed   . Cervical spondylarthritis   . Diverticulosis   . Shortness of breath     AND WHEEZING WITH ACTIVITY AND AT REST  .  Coronary artery disease   . 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 Diabetes mellitus     No insulin- NO MEDS - LOST WEIGHT - NO LONGER HAS ELEVATED SUGARS  . History of nephrolithiasis   . Dementia     PT RESIDES AT [facility] - SHE TALKS SOME - DOES NOT AMBULATE- TRANSFERS TO W/C WITH 2 ASSISTANTS  . Alzheimer disease   . Senile dementia   . Alzheimer's disease     Her Past Surgical History Is Significant For: Past Surgical History  Procedure Laterality Date  . Total abdominal hysterectomy    . Cervical discectomy      Dr. Sherwood Gambler  . Cholecystectomy    . Total knee arthroplasty  2006    Right; Dr. Percell Miller  . Colonoscopy  2009  . Joint replacement    . Cystoscopy w/ ureteral stent placement  09/23/2011    Procedure: CYSTOSCOPY WITH RETROGRADE PYELOGRAM/URETERAL STENT PLACEMENT;  Surgeon: Marissa Nestle;  Location:  [facility];  Service: Urology;  Laterality: Right;  . Esophagogastroduodenoscopy N/A 10/31/2013    Procedure: ESOPHAGOGASTRODUODENOSCOPY (EGD);  Surgeon: Rogene Houston, MD;  Location: [facility];  Service: Endoscopy;  Laterality: N/A;  . Vascular surgery      LASER OF RT EYE AFTER EYE STROKE  . Cystoscopy/retrograde/ureteroscopy Left 03/25/2014    Procedure: CYSTOSCOPY WITH  LEFT URETEROSCOPY AND LEFT RETROGRADE  STENT PLACEMENT;  Surgeon: Irine Seal, MD;  Location: [facility];  Service: Urology;  Laterality: Left;  . Holmium laser application Left 07/31/3715    Procedure: HOLMIUM LASER APPLICATION;  Surgeon: Irine Seal, MD;  Location: [facility];  Service: Urology;  Laterality: Left;  . Cystoscopy/retrograde/ureteroscopy/stone extraction with basket Left 04/10/2014    Procedure: LEFT SECOND STAGE URETEORSCOPY STONE EXTRACTION;  Surgeon: Malka So, MD;  Location: [facility];  Service: Urology;  Laterality: Left;  With STENT  . Holmium laser application  9/67/8938    Procedure: HOLMIUM LASER APPLICATION;   Surgeon: Malka So, MD;  Location: [facility];  Service: Urology;;    Her Family History Is Significant For: Family History  Problem Relation Age of Onset  . Diabetes Sister   . Heart disease Sister   . Diabetes Brother     Her Social History Is Significant For: History   Social History  . Marital Status: Widowed    Spouse Name: N/A    Number of Children: N/A  . Years of Education: N/A   Social History Main Topics  . Smoking status: Never Smoker   . Smokeless tobacco: Never Used  . Alcohol Use: No  . Drug Use: No  . Sexual Activity: No   Other Topics Concern  . None   Social History Narrative   Retired from [employer]     Her Allergies Are:  Allergies  Allergen Reactions  . Codeine Other (See Comments)    REACTION: Unknown  . Dilaudid [Hydromorphone Hcl]     Caused agitation, hallucinations, could not sleep, pt made funny noises like laughing / crying.  . Morphine Nausea And Vomiting  . Other     NO CITRUS, TOMATO BASED PRODUCTS OR CHOCOLATE - BECAUSE OF REFLUX PT'S FAMILY STATES THAT ANY TIME PT IS GIVEN NARCOTICS SHE HALLUCINATES AND TRIES TO GET UP OUT W/C OR BED Medications have to be crushed and put in apple sauce.  :   Her Current Medications Are:  Outpatient Encounter Prescriptions as of 05/16/2014  Medication Sig  . acetaminophen (TYLENOL) 500 MG tablet Take 1,000 mg by mouth every 6 (six) hours as needed for mild pain or moderate pain.  . B Complex-C (B-COMPLEX WITH VITAMIN C) tablet TAKE 1 TABLET BY MOUTH ONCE DAILY.  . bacitracin-polymyxin b (POLYSPORIN) ophthalmic ointment Place 1 application into both eyes at bedtime. apply to eye every 12 hours while awake  . cefPROZIL (CEFZIL) 500 MG tablet   . citalopram (CELEXA) 20 MG tablet Take 20 mg by mouth every morning.   . Difluprednate (DUREZOL) 0.05 % EMUL Place 1 drop into the right eye at bedtime.   . donepezil (ARICEPT) 5 MG tablet Take 5 mg by mouth every morning.  Marland Kitchen erythromycin  ophthalmic ointment Place 1 application into both eyes daily.  . hydroxypropyl methylcellulose (ISOPTO TEARS) 2.5 % ophthalmic solution Place 1 drop into both eyes 3 (three) times daily as needed for dry eyes.  Marland Kitchen loratadine (CLARITIN) 10 MG tablet Take 10 mg by mouth daily.  . memantine (NAMENDA) 10 MG tablet Take  10 mg by mouth 2 (two) times daily.  Marland Kitchen olmesartan (BENICAR) 20 MG tablet Take 20 mg by mouth every morning.  Marland Kitchen omeprazole (PRILOSEC) 40 MG capsule TAKE 1 CAPSULE BY MOUTH TWICE DAILY FOR ACID REFLUX.  Marland Kitchen ondansetron (ZOFRAN-ODT) 4 MG disintegrating tablet Take 4 mg by mouth every 6 (six) hours as needed for nausea or vomiting.  . polyethylene glycol (MIRALAX / GLYCOLAX) packet Take 17 g by mouth daily. *Mixed with 8 ounces of water/juice daily*  . primidone (MYSOLINE) 50 MG tablet Take 25 mg by mouth at bedtime.  . Q-PAP 500 MG tablet TAKE 2 TABLETS BY MOUTH EVERY 6 HOURS AS NEEDED. NO MORE THAN 6 TABS/24 HOURS.  Marland Kitchen sulfacetamide (BLEPH-10) 10 % ophthalmic solution   . traMADol (ULTRAM) 50 MG tablet Take 1 tablet (50 mg total) by mouth every 6 (six) hours as needed for moderate pain.  . vitamin E 400 UNIT capsule TAKE 1 CAPSULE BY MOUTH ONCE DAILY.  Marland Kitchen aspirin EC 81 MG tablet Take 81 mg by mouth daily.  . [DISCONTINUED] albuterol (PROVENTIL) (2.5 MG/3ML) 0.083% nebulizer solution   . [DISCONTINUED] budesonide (PULMICORT) 0.5 MG/2ML nebulizer solution   . [DISCONTINUED] ipratropium (ATROVENT) 0.02 % nebulizer solution   . [DISCONTINUED] Q-PAP 500 MG tablet TAKE 1 TABLET BY MOUTH TWICE DAILY.  . [DISCONTINUED] sucralfate (CARAFATE) 1 G tablet TAKE 2 TABLETS BY MOUTH ONCE DAILY AT 10pm.(CRUSH & MIX WITH SMALL AMOUNT OF LIQUID.)  :  Review of Systems:  Out of a complete 14 point review of systems, all are reviewed and negative with the exception of these symptoms as listed below:  Review of Systems  Constitutional: Negative.   HENT: Negative.   Eyes: Negative.   Respiratory: Negative.    Cardiovascular: Negative.   Gastrointestinal: Negative.   Endocrine: Negative.   Genitourinary: Negative.   Musculoskeletal: Negative.   Skin: Negative.   Allergic/Immunologic: Negative.   Neurological: Negative.   Hematological: Negative.   Psychiatric/Behavioral: Negative.   All other systems reviewed and are negative.   Objective:  Neurologic Exam  Physical Exam Physical Examination:   Filed Vitals:   05/16/14 1137  BP: 134/78  Pulse: 79    On general exam: The patient is a frail-appearing elderly lady situated in a wheelchair in no apparent distress. She is very friendly and cooperative but is unable to perform multistep tasks and has even trouble with mimicking. She is unable to provide any history. She answers questions in one or 2 word sentences. She is able to follow one-step commands.  HEENT exam. She has right enophthalmos and right ptosis, unchanged as well as poor tracking. Face is symmetric with fairly normal facial animation. She has no lip or neck tremor today. Neck is fairly supple once she is able to relax. Full range of motion is noted. Hearing is impaired. Speech is very mildly dysarthric and at times difficult to understand, unchanged.  Chest auscultation demonstrates no wheezing. Heart sounds are normal. Abdominal sounds are normal, and abdomen is soft and nontender. She has no pitting edema in the distal lower extremities. Mucosal membranes are mildly dry and skin is warm and dry.  Neurologically: Mental status: The patient is awake and paying little attention. She is not oriented except for self.  On 05/16/2014: MMSE: 4/30, CDT: 0/4, AFT: 0/min.  Cranial nerves are as described under HEENT exam. Motor exam: Fairly normal bulk is noted for age. Tone is fairly N. Strength is 4/5 globally. She does not exhibit much in the  way of tremors in the upper extremities today. Fine motor skills are mildly impaired in both upper and lower extremities. She can stand with  assistance, but is not safe to walk. Sensory exam is intact to light touch throughout. She is not able to participate for finger to nose testing or more extensive sensory testing.   Assessment and Plan:   In summary, [name] [name] is a very pleasant 80 year old right-handed female with an underlying medical history of coronary artery disease, hypertension, hyperlipidemia, diabetes, degenerative joint disease, status post right total knee replacement in 2006, upper GI bleed secondary to stress ulcer in 2003, retinal artery branch occlusion of the right eye secondary to embolism suspected, reflux disease with hiatal hernia, osteoporosis, left lower lobe lung nodule, cervical spondylosis, status post discectomy, diverticulosis, and kidney stones, who presents for followup consultation of her advanced dementia, gait disturbance, and tremors. She recently had hospitalizations for kidney stone extraction and stent removal. I would like to continue her on Aricept 5 mg, Namenda 10 mg twice daily, and even though she has not made much in the way of improvement cognitively, she seems to tolerate these medications well and we can continue with them. Her family is in agreement. We will continue [name]-dose Mysoline for tremor control symptomatically as it has worked well. I would like to see her back in routine followup in 4 months from now, sooner if the need arises. [name] [name] and [name] [name] were in agreement.

## 2014-05-16 NOTE — Patient Instructions (Signed)
We will continue with Namenda 10 mg twice daily and Aricept 5 mg daily.  Because of recurrent headaches, let's check a CT head. We will call with the test results.

## 2014-05-21 ENCOUNTER — Other Ambulatory Visit: Payer: Self-pay | Admitting: Family Medicine

## 2014-05-26 ENCOUNTER — Other Ambulatory Visit: Payer: Self-pay | Admitting: Family Medicine

## 2014-05-28 ENCOUNTER — Other Ambulatory Visit: Payer: Self-pay | Admitting: Family Medicine

## 2014-05-29 ENCOUNTER — Other Ambulatory Visit: Payer: Self-pay | Admitting: Family Medicine

## 2014-05-29 ENCOUNTER — Telehealth: Payer: Self-pay | Admitting: Neurology

## 2014-05-29 NOTE — Telephone Encounter (Signed)
Granddaughter calling to check status for scheduling of CT head, hadn't heard anything about appointment.  Please call and advise.

## 2014-05-30 ENCOUNTER — Other Ambulatory Visit: Payer: Self-pay | Admitting: Family Medicine

## 2014-05-30 NOTE — Telephone Encounter (Signed)
Granddaughter Marita Kansas calling and stated she wanted CT scan scheduled at [facility].  Please call and advise.  Thanks

## 2014-05-30 NOTE — Telephone Encounter (Signed)
Pt's information was given to Adeline, Ash[place].

## 2014-06-03 NOTE — Telephone Encounter (Signed)
[name] [name], patients daughter and POA, calling about the CT Scan.  She spoke to [facility] and it looks like they have the order for the scan but state they are waiting on our office to schedule the appt.  She would like to have this done asap as she is going out of town and would like to do this before she leaves.  Please call. [phone]

## 2014-06-06 NOTE — Telephone Encounter (Signed)
Patient's daughter states CT scan scheduled for patient at [facility] on 06-10-14 @ 2:15pm--patient's daughter wants to know if there is a problem with patient's insurance--please call.

## 2014-06-06 NOTE — Telephone Encounter (Signed)
Called pt's daughter Hilda Blades and informed her that the information was corrected by Charisse March, CMA and that the pt was ok to keep her appt on 06/10/14. I advised the daughter that if the pt has any other problems, questions or concerns to call the office. Daughter verbalized understanding.

## 2014-06-10 ENCOUNTER — Ambulatory Visit (HOSPITAL_COMMUNITY)
Admission: RE | Admit: 2014-06-10 | Discharge: 2014-06-10 | Disposition: A | Discharge status: Home / Self Care | Payer: Medicare Other | Source: Ambulatory Visit | Attending: Neurology | Admitting: Neurology

## 2014-06-10 DIAGNOSIS — R519 Headache, unspecified: Secondary | ICD-10-CM

## 2014-06-10 DIAGNOSIS — G252 Other specified forms of tremor: Secondary | ICD-10-CM

## 2014-06-10 DIAGNOSIS — F0390 Unspecified dementia without behavioral disturbance: Secondary | ICD-10-CM | POA: Insufficient documentation

## 2014-06-10 DIAGNOSIS — R51 Headache: Secondary | ICD-10-CM | POA: Diagnosis not present

## 2014-06-10 DIAGNOSIS — G250 Essential tremor: Secondary | ICD-10-CM | POA: Diagnosis not present

## 2014-06-11 NOTE — Progress Notes (Signed)
Quick Note:  Please call family member regarding her CT results, no obvious evidence for any source of her headaches. She has volume loss and changes in the blood vessels which are in keeping with vascular changes. She has global atrophy meaning shrinkage of the brain which is not new. No acute findings, no bleed, no evidence of a large stroke, no unexpected findings. Star Age, MD, PhD [facility] ([facility]  ______

## 2014-06-11 NOTE — Progress Notes (Signed)
Quick Note:  I called and spoke to [name] [name], daughter and gave her the results of the CT (brain atrophy and SVD). Nothing bad. She verbalized understanding. ______

## 2014-06-26 NOTE — Telephone Encounter (Signed)
Noted  

## 2014-06-30 ENCOUNTER — Other Ambulatory Visit: Payer: Self-pay | Admitting: Family Medicine

## 2014-07-01 ENCOUNTER — Encounter (INDEPENDENT_AMBULATORY_CARE_PROVIDER_SITE_OTHER): Payer: Self-pay | Admitting: Internal Medicine

## 2014-07-01 ENCOUNTER — Ambulatory Visit (INDEPENDENT_AMBULATORY_CARE_PROVIDER_SITE_OTHER): Payer: Medicare Other | Admitting: Internal Medicine

## 2014-07-01 ENCOUNTER — Other Ambulatory Visit: Payer: Self-pay | Admitting: Family Medicine

## 2014-07-01 VITALS — BP 128/74 | HR 68 | Temp 97.3°F | Resp 18 | Ht 60.0 in

## 2014-07-01 DIAGNOSIS — K219 Gastro-esophageal reflux disease without esophagitis: Secondary | ICD-10-CM | POA: Diagnosis not present

## 2014-07-01 DIAGNOSIS — K449 Diaphragmatic hernia without obstruction or gangrene: Secondary | ICD-10-CM | POA: Diagnosis not present

## 2014-07-01 NOTE — Progress Notes (Signed)
Presenting complaint;  Followup for chronic GERD.  Subjective:  Patient is an 80 year old Caucasian female with multiple medical problems including dementia who is here for scheduled visit accompanied by her daughter [name] [name]. She is being cared for at [facility]. She has history of peptic ulcer disease and GERD. She underwent EGD in December 2014 for recurrent vomiting spells and found to have a large hiatal hernia. She was last seen 6 months ago. According to her daughter patient has had no problems. She has not experienced nausea vomiting or swallowing difficulty. Staff at [facility] has been chopping her meat. There is also no history of abdominal pain melena or rectal bleeding. She was admitted 3 months ago with left urolithiasis and underwent 2 procedures and is doing fine.   Current Medications: Outpatient Encounter Prescriptions as of 07/01/2014  Medication Sig  . acetaminophen (TYLENOL) 500 MG tablet Take 1,000 mg by mouth every 6 (six) hours as needed for mild pain or moderate pain.  . ASPIRIN LOW DOSE 81 MG EC tablet TAKE 1 TABLET BY MOUTH ONCE DAILY.  . B Complex-C (B-COMPLEX WITH VITAMIN C) tablet TAKE 1 TABLET BY MOUTH ONCE DAILY.  . bacitracin-polymyxin b (POLYSPORIN) ophthalmic ointment Place 1 application into both eyes at bedtime. apply to eye every 12 hours while awake  . citalopram (CELEXA) 20 MG tablet TAKE 1 TABLET BY MOUTH ONCE DAILY.(FOR DEPRESSION)  . Difluprednate (DUREZOL) 0.05 % EMUL Place 1 drop into the right eye at bedtime.   . donepezil (ARICEPT) 5 MG tablet TAKE 1 TABLET BY MOUTH EACH MORNING.  Marland Kitchen erythromycin ophthalmic ointment Place 1 application into both eyes daily.  . hydroxypropyl methylcellulose (ISOPTO TEARS) 2.5 % ophthalmic solution Place 1 drop into both eyes 3 (three) times daily as needed for dry eyes.  Marland Kitchen loratadine (CLARITIN) 10 MG tablet Take 10 mg by mouth daily.  . memantine (NAMENDA) 10 MG tablet Take 10 mg by mouth 2 (two) times daily.   Marland Kitchen olmesartan (BENICAR) 20 MG tablet Take 20 mg by mouth every morning.  Marland Kitchen omeprazole (PRILOSEC) 40 MG capsule TAKE 1 CAPSULE BY MOUTH TWICE DAILY FOR ACID REFLUX.  Marland Kitchen ondansetron (ZOFRAN-ODT) 4 MG disintegrating tablet Take 4 mg by mouth every 6 (six) hours as needed for nausea or vomiting.  . polyethylene glycol powder (GLYCOLAX/MIRALAX) powder MIX 1 CAPFUL (17G) IN 8 OUNCES OF JUICE/WATER AND DRINK ONCE DAILY.  Marland Kitchen primidone (MYSOLINE) 50 MG tablet Take 25 mg by mouth at bedtime.  . Q-PAP 500 MG tablet TAKE 1 TABLET BY MOUTH TWICE DAILY.  Marland Kitchen sucralfate (CARAFATE) 1 G tablet Take 1 g by mouth 2 (two) times daily.   Marland Kitchen sulfacetamide (BLEPH-10) 10 % ophthalmic solution   . traMADol (ULTRAM) 50 MG tablet Take 1 tablet (50 mg total) by mouth every 6 (six) hours as needed for moderate pain.  . vitamin E 400 UNIT capsule TAKE 1 CAPSULE BY MOUTH ONCE DAILY.  . [DISCONTINUED] cefPROZIL (CEFZIL) 500 MG tablet     Objective: Blood pressure 128/74, pulse 68, temperature 97.3 F (36.3 C), temperature source Oral, resp. rate 18, height 5' (1.524 m), weight 0 lb (0 kg). Patient appears to be comfortable in chair. She does respond appropriately to simple questions. Conjunctiva is pink. Sclera is nonicteric Oropharyngeal mucosa is normal. No neck masses or thyromegaly noted. Cardiac exam with regular rhythm normal S1 and S2. No murmur or gallop noted. Lungs are clear to auscultation. Abdomen was examined in sitting position. Abdomen is soft and nontender without  organomegaly or masses. No LE edema or clubbing noted.    Assessment:  Chronic GERD. She has large sliding hiatal hernia by EGD in December 2014 was negative for erosive esophagitis. She is doing quite well with double dose PPI and sucralfate. Since she has done so well for almost 8 months, it is time to back off on her therapy.  Plan:  Discontinue sucralfate. Patient needs to be upright for 2-3 hours after each meal. If patient has no  problems, omeprazole dose can be decreased to 40 mg every morning after 3 months. Office visit in 6 months unless symptoms relapse.

## 2014-07-01 NOTE — Patient Instructions (Signed)
Call if patient has vomiting

## 2014-07-10 ENCOUNTER — Other Ambulatory Visit: Payer: Self-pay | Admitting: Family Medicine

## 2014-07-10 NOTE — Telephone Encounter (Signed)
Last seen 04/25/14.

## 2014-07-16 DIAGNOSIS — E1159 Type 2 diabetes mellitus with other circulatory complications: Secondary | ICD-10-CM | POA: Diagnosis not present

## 2014-07-17 ENCOUNTER — Ambulatory Visit (INDEPENDENT_AMBULATORY_CARE_PROVIDER_SITE_OTHER): Payer: Medicare Other | Admitting: Family Medicine

## 2014-07-17 ENCOUNTER — Encounter: Payer: Self-pay | Admitting: Family Medicine

## 2014-07-17 VITALS — BP 128/66 | Ht 61.0 in | Wt 179.0 lb

## 2014-07-17 DIAGNOSIS — I10 Essential (primary) hypertension: Secondary | ICD-10-CM

## 2014-07-17 DIAGNOSIS — I8310 Varicose veins of unspecified lower extremity with inflammation: Secondary | ICD-10-CM

## 2014-07-17 DIAGNOSIS — I872 Venous insufficiency (chronic) (peripheral): Secondary | ICD-10-CM

## 2014-07-17 MED ORDER — HYDROCHLOROTHIAZIDE 12.5 MG PO CAPS: 12.5000 mg | ORAL_CAPSULE | Freq: Every day | ORAL | Status: DC

## 2014-07-17 NOTE — Progress Notes (Signed)
   Subjective:    Patient ID: NORMAGENE HARVIE, female    DOB: 20-Dec-1923, 80 y.o.   MRN: [id number]  HPIBilateral leg swelling. Started last Friday. Patient has overall good intake.  Occasional bluish discoloration of the feet. Bilateral. This concerns family. Also brought up in the June visit. See prior notes.  Complaining of left great toe pain.   Wheezing and runny nose. Has been wheezing for awhile. Has seen a lung specialist. Wheezing is better.   Review of Systems Compromised by dementia and no obvious chest pain shortness of breath or abdominal pain. ROS otherwise negative    Objective:   Physical Exam  Alert vitals stable. Lungs clear currently. Heart regular rate and rhythm. Ankles trace to 1+ edema. Arterial pulses present. Capillary refill somewhat diminished. Regions of bluish discoloration venous in nature.      Assessment & Plan:  Impression 1 venous stasis #2 peripheral edema off diuretics. #3 dementia compromises history plan reinitiate low-dose diuretic. Followup as scheduled. No obvious macrovascular difficulty with decent pulses however patient may be experiencing some microvascular compromise. With no acute ulcerations etc. no further workup at this time recommended. WSL

## 2014-07-18 DIAGNOSIS — I872 Venous insufficiency (chronic) (peripheral): Secondary | ICD-10-CM | POA: Insufficient documentation

## 2014-07-21 ENCOUNTER — Telehealth: Payer: Self-pay | Admitting: Family Medicine

## 2014-07-21 MED ORDER — HYDROCHLOROTHIAZIDE 12.5 MG PO CAPS: 12.5000 mg | ORAL_CAPSULE | Freq: Every day | ORAL | Status: DC

## 2014-07-21 NOTE — Telephone Encounter (Signed)
[facility] said they never received the hydrochlorothiazide (MICROZIDE) 12.5 MG capsule. Please resend in. Thanks!

## 2014-07-28 ENCOUNTER — Other Ambulatory Visit: Payer: Self-pay | Admitting: Family Medicine

## 2014-07-30 ENCOUNTER — Ambulatory Visit: Payer: Medicare Other | Admitting: Family Medicine

## 2014-07-31 ENCOUNTER — Encounter: Payer: Self-pay | Admitting: Internal Medicine

## 2014-08-01 ENCOUNTER — Ambulatory Visit (INDEPENDENT_AMBULATORY_CARE_PROVIDER_SITE_OTHER): Payer: Medicare Other | Admitting: Family Medicine

## 2014-08-01 ENCOUNTER — Ambulatory Visit: Payer: Medicare Other | Admitting: Family Medicine

## 2014-08-01 ENCOUNTER — Encounter: Payer: Self-pay | Admitting: Family Medicine

## 2014-08-01 VITALS — Ht 61.0 in

## 2014-08-01 DIAGNOSIS — R7309 Other abnormal glucose: Secondary | ICD-10-CM

## 2014-08-01 DIAGNOSIS — R609 Edema, unspecified: Secondary | ICD-10-CM

## 2014-08-01 DIAGNOSIS — K219 Gastro-esophageal reflux disease without esophagitis: Secondary | ICD-10-CM

## 2014-08-01 DIAGNOSIS — R600 Localized edema: Secondary | ICD-10-CM

## 2014-08-01 DIAGNOSIS — E785 Hyperlipidemia, unspecified: Secondary | ICD-10-CM

## 2014-08-01 DIAGNOSIS — Z23 Encounter for immunization: Secondary | ICD-10-CM | POA: Diagnosis not present

## 2014-08-01 DIAGNOSIS — I10 Essential (primary) hypertension: Secondary | ICD-10-CM | POA: Diagnosis not present

## 2014-08-01 DIAGNOSIS — R739 Hyperglycemia, unspecified: Secondary | ICD-10-CM

## 2014-08-01 NOTE — Progress Notes (Signed)
   Subjective:    Patient ID: [name] [name], female    DOB: 1924-02-23, 80 y.o.   MRN: [id number]  Hypertension This is a chronic problem. The current episode started more than 1 year ago. Pertinent negatives include no chest pain or shortness of breath. Risk factors for coronary artery disease include dyslipidemia and post-menopausal state. Treatments tried: benicar. There are no compliance problems.    As history prediabetes they tried to minimize the amount of starches she has available to her. Patient also has reflux is recommended that the patient stay upright as long as possible after meal that she must lay down for a nap   Review of Systems  Constitutional: Negative for activity change, appetite change and fatigue.  Respiratory: Negative for shortness of breath.   Cardiovascular: Positive for leg swelling. Negative for chest pain.  Endocrine: Negative for polydipsia and polyphagia.  Genitourinary: Negative for frequency.  Neurological: Negative for weakness.  Psychiatric/Behavioral: Negative for confusion.       Objective:   Physical Exam  Vitals reviewed. Constitutional: She appears well-nourished. No distress.  Cardiovascular: Normal rate, regular rhythm and normal heart sounds.   No murmur heard. Pulmonary/Chest: Effort normal and breath sounds normal. No respiratory distress.  Musculoskeletal: She exhibits no edema.  Lymphadenopathy:    She has no cervical adenopathy.  Neurological: She is alert. She exhibits normal muscle tone.  Psychiatric: Her behavior is normal.          Assessment & Plan:  HTN-decent control continue current measures Mouth pedal edema this is related to her this sitting up most of the time I would recommend propping her legs up when feasible. Hopefully diuretic will help  Metabolic 7 was ordered await results  Prediabetes not a major concern will check hemoglobin A1c Pneumococcal 13 vaccine given today. Reflux-the importance of  staying upright for at least one hour after meal was discussed but because of her age she must take a day or so therefore when she does the need to keep head of bed at 30. This order was written. Followup 3 months

## 2014-08-02 LAB — BASIC METABOLIC PANEL
BUN: 34 mg/dL — ABNORMAL HIGH (ref 6–23)
CHLORIDE: 108 meq/L (ref 96–112)
CO2: 25 mEq/L (ref 19–32)
Calcium: 9.2 mg/dL (ref 8.4–10.5)
Creat: 1.59 mg/dL — ABNORMAL HIGH (ref 0.50–1.10)
Glucose, Bld: 151 mg/dL — ABNORMAL HIGH (ref 70–99)
POTASSIUM: 4.4 meq/L (ref 3.5–5.3)
SODIUM: 142 meq/L (ref 135–145)

## 2014-08-02 LAB — HEMOGLOBIN A1C
Hgb A1c MFr Bld: 6.5 % — ABNORMAL HIGH (ref <5.7)
MEAN PLASMA GLUCOSE: 140 mg/dL — AB (ref <117)

## 2014-08-04 ENCOUNTER — Other Ambulatory Visit: Payer: Self-pay | Admitting: Family Medicine

## 2014-08-06 ENCOUNTER — Other Ambulatory Visit: Payer: Self-pay | Admitting: Family Medicine

## 2014-08-06 MED ORDER — HYDROCHLOROTHIAZIDE 12.5 MG PO CAPS: ORAL_CAPSULE | ORAL | Status: DC

## 2014-08-06 NOTE — Addendum Note (Signed)
Addended by: Dairl Ponder on: 08/06/2014 09:51 AM   Modules accepted: Orders

## 2014-08-08 ENCOUNTER — Other Ambulatory Visit: Payer: Self-pay | Admitting: Family Medicine

## 2014-08-20 ENCOUNTER — Telehealth: Payer: Self-pay | Admitting: Internal Medicine

## 2014-08-20 NOTE — Telephone Encounter (Signed)
This needs to be deferred to Dr Wolfgang Phoenix She was just seen by him for eval of HTN  I spoke with Vicente Males at [facility] and notified her of this  Nothing further needed

## 2014-08-21 ENCOUNTER — Other Ambulatory Visit: Payer: Self-pay | Admitting: *Deleted

## 2014-08-21 ENCOUNTER — Encounter: Payer: Self-pay | Admitting: Family Medicine

## 2014-08-21 ENCOUNTER — Ambulatory Visit (INDEPENDENT_AMBULATORY_CARE_PROVIDER_SITE_OTHER): Payer: Medicare Other | Admitting: Family Medicine

## 2014-08-21 VITALS — BP 92/70 | Ht 61.0 in

## 2014-08-21 DIAGNOSIS — I10 Essential (primary) hypertension: Secondary | ICD-10-CM | POA: Diagnosis not present

## 2014-08-21 DIAGNOSIS — Z79899 Other long term (current) drug therapy: Secondary | ICD-10-CM

## 2014-08-21 DIAGNOSIS — N183 Chronic kidney disease, stage 3 unspecified: Secondary | ICD-10-CM

## 2014-08-21 DIAGNOSIS — N390 Urinary tract infection, site not specified: Secondary | ICD-10-CM | POA: Diagnosis not present

## 2014-08-21 DIAGNOSIS — R5383 Other fatigue: Secondary | ICD-10-CM | POA: Diagnosis not present

## 2014-08-21 LAB — CBC WITH DIFFERENTIAL/PLATELET
Basophils Absolute: 0 10*3/uL (ref 0.0–0.1)
Basophils Relative: 0 % (ref 0–1)
EOS PCT: 4 % (ref 0–5)
Eosinophils Absolute: 0.2 10*3/uL (ref 0.0–0.7)
HCT: 34.2 % — ABNORMAL LOW (ref 36.0–46.0)
Hemoglobin: 11.3 g/dL — ABNORMAL LOW (ref 12.0–15.0)
LYMPHS PCT: 35 % (ref 12–46)
Lymphs Abs: 1.8 10*3/uL (ref 0.7–4.0)
MCH: 33.5 pg (ref 26.0–34.0)
MCHC: 33 g/dL (ref 30.0–36.0)
MCV: 101.5 fL — AB (ref 78.0–100.0)
MONOS PCT: 12 % (ref 3–12)
Monocytes Absolute: 0.6 10*3/uL (ref 0.1–1.0)
Neutro Abs: 2.5 10*3/uL (ref 1.7–7.7)
Neutrophils Relative %: 49 % (ref 43–77)
Platelets: 221 10*3/uL (ref 150–400)
RBC: 3.37 MIL/uL — AB (ref 3.87–5.11)
RDW: 13.4 % (ref 11.5–15.5)
WBC: 5.2 10*3/uL (ref 4.0–10.5)

## 2014-08-21 LAB — BASIC METABOLIC PANEL
BUN: 32 mg/dL — AB (ref 6–23)
CHLORIDE: 106 meq/L (ref 96–112)
CO2: 19 meq/L (ref 19–32)
CREATININE: 1.38 mg/dL — AB (ref 0.50–1.10)
Calcium: 9.1 mg/dL (ref 8.4–10.5)
Glucose, Bld: 107 mg/dL — ABNORMAL HIGH (ref 70–99)
POTASSIUM: 4.9 meq/L (ref 3.5–5.3)
Sodium: 137 mEq/L (ref 135–145)

## 2014-08-21 NOTE — Patient Instructions (Signed)
Stop diuretic  Recheck Met 7 in 2 weeks  If fevers or worse call or recheck

## 2014-08-21 NOTE — Progress Notes (Signed)
   Subjective:    Patient ID: [name] [name], female    DOB: October 22, 1924, 80 y.o.   MRN: [id number]  HPI Patient arrives because she is not acting like herself since Monday pm. Patient had lab work but was unable to get urine. Her lab work was received and reviewed slight anemia probably of chronic disease BUN and creatinine elevated no fevers no vomiting no cough or wheezing Past medical history reviewed Review of Systems According to the family patient is not a usual self she has dementia unable to answer questions    Objective:   Physical Exam Clear for her pulse will is no edema skin warm dry blood pressure with appropriate cuff 92/70 Neurologic severe dementia     Assessment & Plan:  Relative hypotension BUN and creatinine slightly elevated I recommend stopping diuretic family will monitor for any type of fluid overload followup at next regular visit check metabolic 72 weeks' time  Chronic kidney disease recheck metabolic 72 weeks' time stop diuretic  Dementia is stable

## 2014-08-27 ENCOUNTER — Telehealth: Payer: Self-pay | Admitting: Family Medicine

## 2014-08-27 ENCOUNTER — Other Ambulatory Visit: Payer: Self-pay | Admitting: Family Medicine

## 2014-08-27 MED ORDER — CEFPROZIL 500 MG PO TABS: 500.0000 mg | ORAL_TABLET | Freq: Two times a day (BID) | ORAL | Status: DC

## 2014-08-27 NOTE — Telephone Encounter (Signed)
Rx sent electronically to [facility]. Gave orders to [place] at [employer]).

## 2014-08-27 NOTE — Addendum Note (Signed)
Addended by: Dairl Ponder on: 08/27/2014 09:28 AM   Modules accepted: Orders

## 2014-08-27 NOTE — Telephone Encounter (Signed)
Culture was faxed here. Shows positive UTI with E coli. Rec Cefzil 500 mg bid 5 days , may use liquid. Follow up if ongoing. Notify [facility] and send Rx to pharmacy. Thasnks

## 2014-09-04 ENCOUNTER — Telehealth: Payer: Self-pay | Admitting: *Deleted

## 2014-09-04 ENCOUNTER — Other Ambulatory Visit: Payer: Self-pay | Admitting: Family Medicine

## 2014-09-04 DIAGNOSIS — R5383 Other fatigue: Secondary | ICD-10-CM | POA: Diagnosis not present

## 2014-09-04 DIAGNOSIS — Z79899 Other long term (current) drug therapy: Secondary | ICD-10-CM | POA: Diagnosis not present

## 2014-09-04 DIAGNOSIS — N289 Disorder of kidney and ureter, unspecified: Secondary | ICD-10-CM

## 2014-09-04 LAB — BASIC METABOLIC PANEL
BUN: 38 mg/dL — ABNORMAL HIGH (ref 6–23)
CALCIUM: 9.3 mg/dL (ref 8.4–10.5)
CO2: 24 meq/L (ref 19–32)
CREATININE: 1.5 mg/dL — AB (ref 0.50–1.10)
Chloride: 104 mEq/L (ref 96–112)
GLUCOSE: 130 mg/dL — AB (ref 70–99)
Potassium: 4.9 mEq/L (ref 3.5–5.3)
SODIUM: 142 meq/L (ref 135–145)

## 2014-09-04 LAB — CBC WITH DIFFERENTIAL/PLATELET
BASOS PCT: 0 % (ref 0–1)
Basophils Absolute: 0 10*3/uL (ref 0.0–0.1)
EOS ABS: 0.2 10*3/uL (ref 0.0–0.7)
EOS PCT: 3 % (ref 0–5)
HCT: 35.1 % — ABNORMAL LOW (ref 36.0–46.0)
Hemoglobin: 11.4 g/dL — ABNORMAL LOW (ref 12.0–15.0)
LYMPHS ABS: 2.2 10*3/uL (ref 0.7–4.0)
Lymphocytes Relative: 31 % (ref 12–46)
MCH: 33.5 pg (ref 26.0–34.0)
MCHC: 32.5 g/dL (ref 30.0–36.0)
MCV: 103.2 fL — AB (ref 78.0–100.0)
Monocytes Absolute: 0.6 10*3/uL (ref 0.1–1.0)
Monocytes Relative: 9 % (ref 3–12)
Neutro Abs: 4.1 10*3/uL (ref 1.7–7.7)
Neutrophils Relative %: 57 % (ref 43–77)
Platelets: 244 10*3/uL (ref 150–400)
RBC: 3.4 MIL/uL — ABNORMAL LOW (ref 3.87–5.11)
RDW: 13.6 % (ref 11.5–15.5)
WBC: 7.2 10*3/uL (ref 4.0–10.5)

## 2014-09-04 NOTE — Telephone Encounter (Signed)
[facility] called reporting stat labs cbc and bmp are ready.

## 2014-09-05 NOTE — Telephone Encounter (Signed)
I resulted these notes

## 2014-09-08 ENCOUNTER — Encounter: Payer: Self-pay | Admitting: Family Medicine

## 2014-09-08 NOTE — Addendum Note (Signed)
Addended by: Dairl Ponder on: 09/08/2014 02:39 PM   Modules accepted: Orders

## 2014-09-12 ENCOUNTER — Telehealth: Payer: Self-pay | Admitting: Family Medicine

## 2014-09-12 NOTE — Telephone Encounter (Signed)
Order faxed to [facility] and [facility]

## 2014-09-12 NOTE — Telephone Encounter (Signed)
Patient has had diarrhea today and needs Rx for immodium today.  [facility]

## 2014-09-12 NOTE — Telephone Encounter (Signed)
Patient needs an order for [name] [name] for the diarrhea she is experiencing today, whether it be imodium or an RX.

## 2014-09-12 NOTE — Telephone Encounter (Signed)
immod cap prn one up to bid prn

## 2014-09-26 ENCOUNTER — Telehealth: Payer: Self-pay | Admitting: Family Medicine

## 2014-09-26 NOTE — Telephone Encounter (Signed)
Nurse at [facility] stated that they need strength and directions for the Robitussin and Tylenol. They need prescription sent to [facility] and orders faxed to them at [phone].

## 2014-09-26 NOTE — Telephone Encounter (Signed)
ntsw like to avoid antihis at this age, robitussin prn cough tyleno prn fever

## 2014-09-26 NOTE — Telephone Encounter (Signed)
Two tspns plain q 6 hrs prn cong cough Tylenol 325 mg two every six hrs prn fever achiness

## 2014-09-26 NOTE — Telephone Encounter (Signed)
Pt is at [facility] an they won't give her anything over the counter for her  Cold. They need permission/orders from the doc and what to give before they will   Symptoms:  Crusty eyes, cough, sore throat, fever unknown, runny nose   [facility]

## 2014-09-26 NOTE — Telephone Encounter (Signed)
rx faxed to [place] and [facility].

## 2014-10-01 DIAGNOSIS — E1151 Type 2 diabetes mellitus with diabetic peripheral angiopathy without gangrene: Secondary | ICD-10-CM | POA: Diagnosis not present

## 2014-10-01 DIAGNOSIS — L84 Corns and callosities: Secondary | ICD-10-CM | POA: Diagnosis not present

## 2014-10-02 ENCOUNTER — Ambulatory Visit (INDEPENDENT_AMBULATORY_CARE_PROVIDER_SITE_OTHER): Payer: Medicare Other | Admitting: Family Medicine

## 2014-10-02 ENCOUNTER — Encounter: Payer: Self-pay | Admitting: Family Medicine

## 2014-10-02 VITALS — Temp 98.5°F | Ht 61.0 in

## 2014-10-02 DIAGNOSIS — J208 Acute bronchitis due to other specified organisms: Secondary | ICD-10-CM

## 2014-10-02 IMAGING — US US RENAL
1 series · 13 of 25 positions shown · non-contrast
Comparison: CT scan of the abdomen dated March 04, 2014.

CLINICAL DATA: Urinary tract stones

EXAM:
RENAL/URINARY TRACT ULTRASOUND COMPLETE

[Series 1: us renal · 0.23mm/px · 13 of 64 slices shown]
[im 1/64]
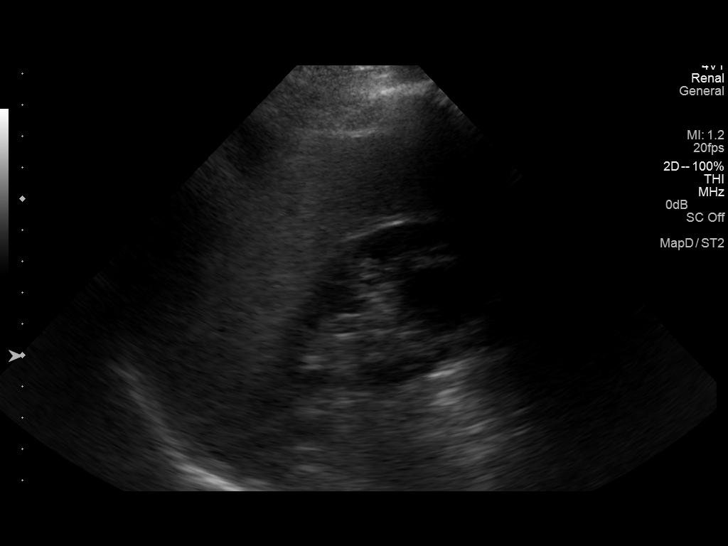
[im 6/64]
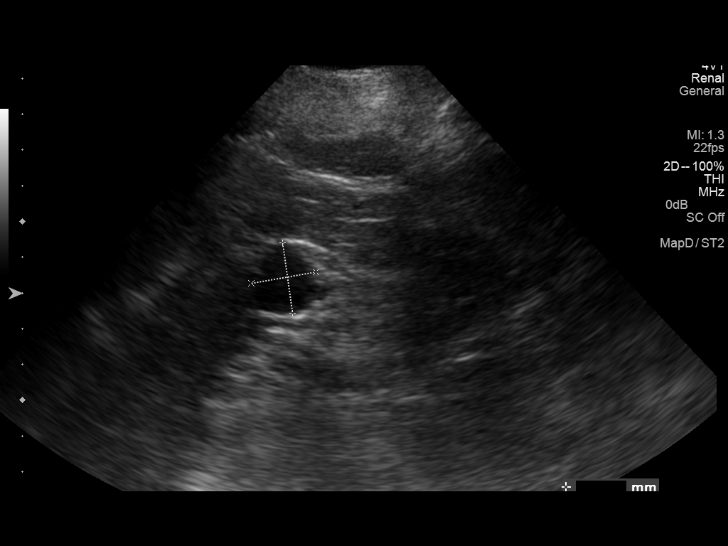
[im 11/64]
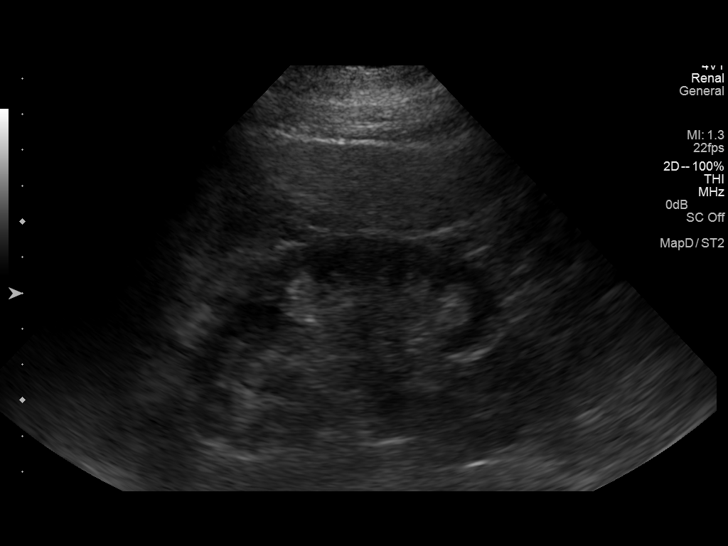
[im 16/64]
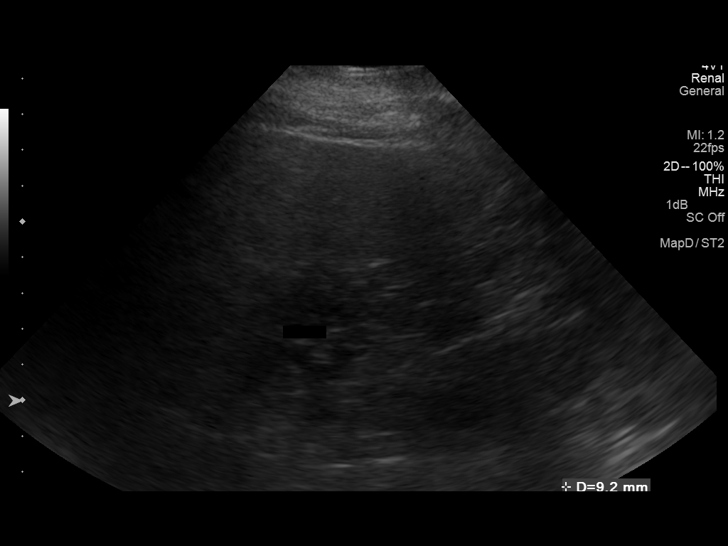
[im 22/64]
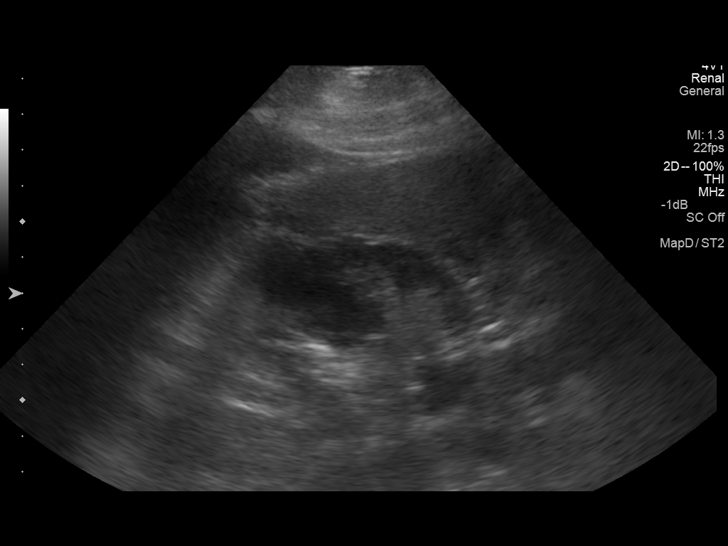
[im 27/64]
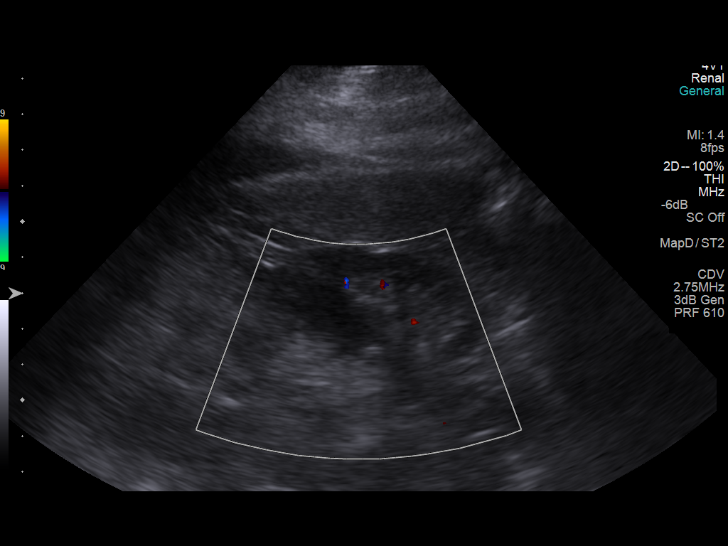
[im 32/64]
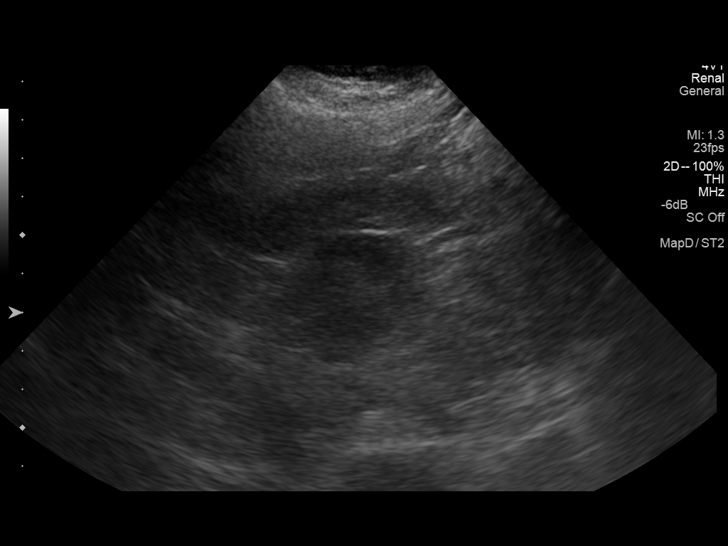
[im 37/64]
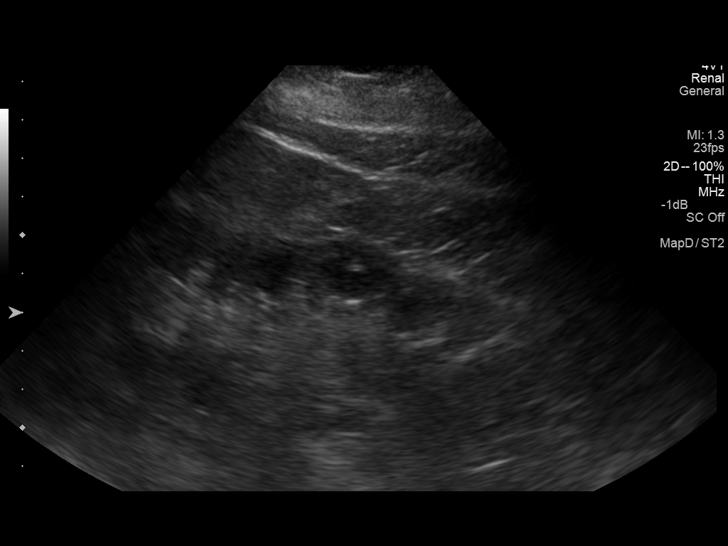
[im 43/64]
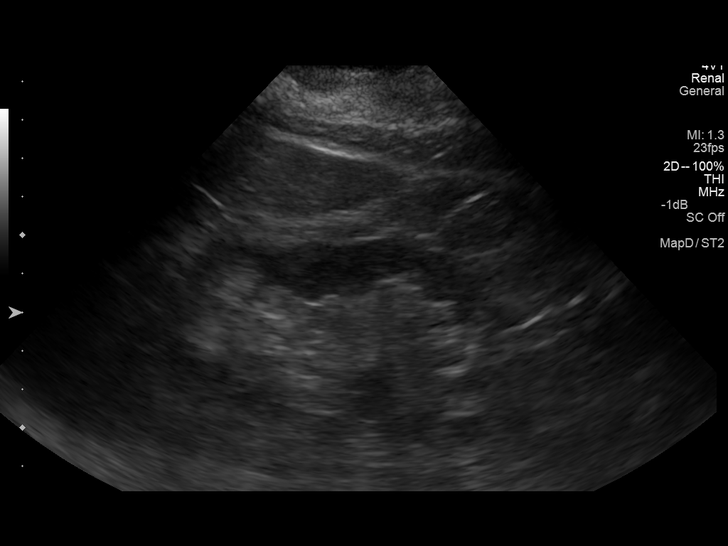
[im 48/64]
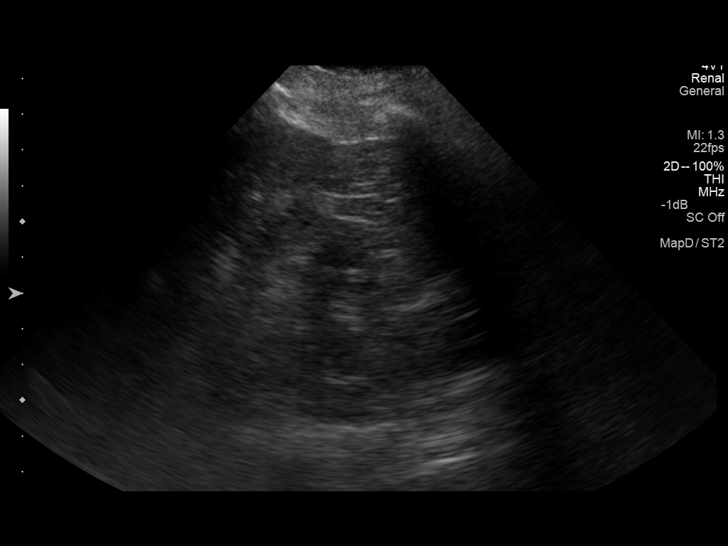
[im 53/64]
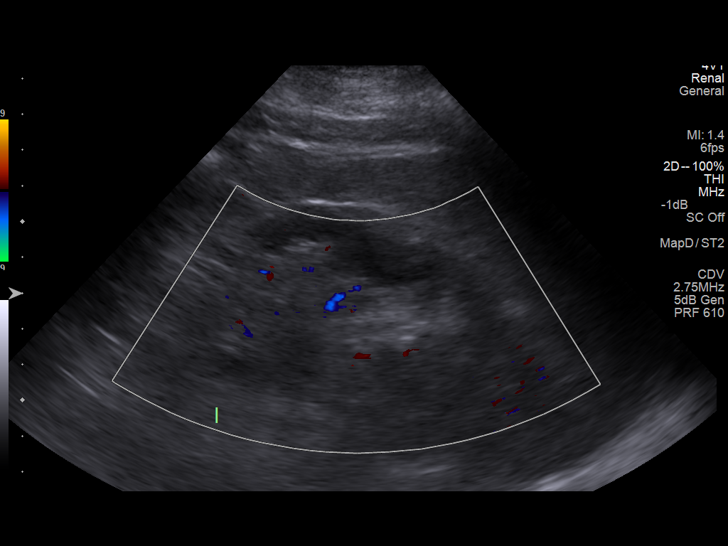
[im 58/64]
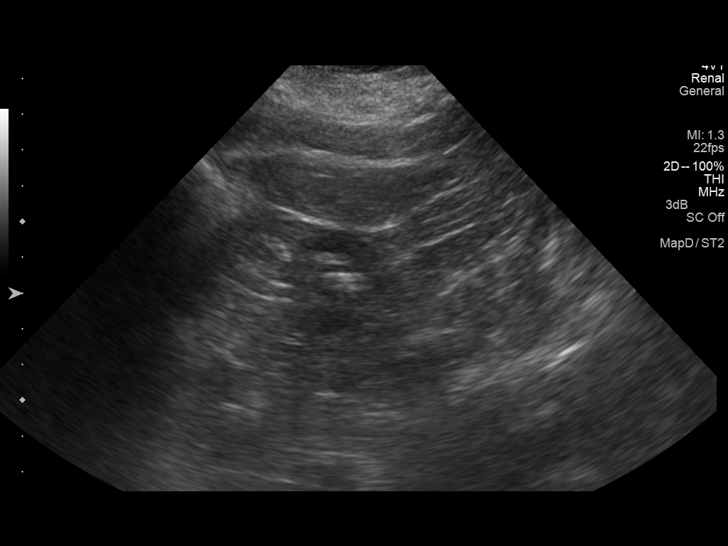
[im 64/64]
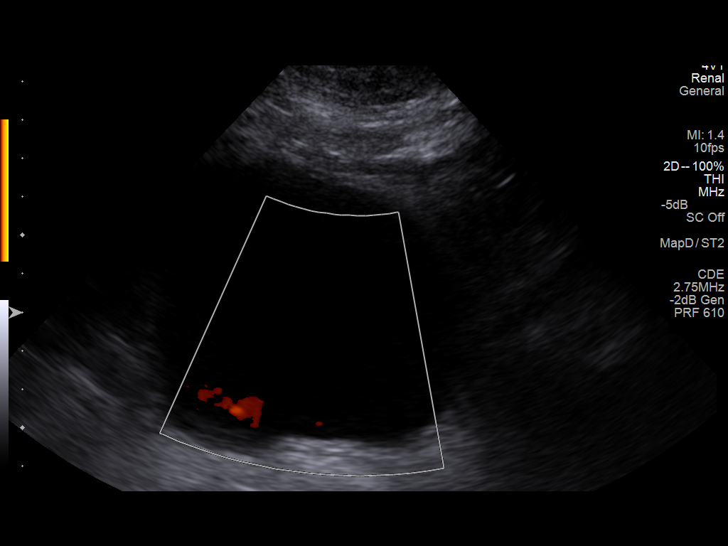

[13 of 25 positions shown; findings below may reference images not displayed]

FINDINGS: Right Kidney:

Length: 10.5 cm. There is a midpole cystic structure measuring 3.1 x
2.7 x 2.5 cm. A peripheral mid pole cortically based cyst measures 2
x 1.8 x 2.1 cm. There were mural calcifications calcifications
demonstrated in the peripheral cystic structure on the previous CT
and echogenic minimally shadowing foci in the cyst walls are
demonstrated today. Elsewhere within the kidney no definite stones
are demonstrated. There is no hydronephrosis.

Left Kidney:

Length: 10.7 cm. In the midpole there is a poorly defined slightly
hypoechoic focus which may reflect a complex cyst. There is no
corresponding abnormality on the previous noncontrast CT scan. There
are echogenic shadowing foci within the kidney compatible with
nonobstructing stones. The largest lies in the lower pole and
measures 9 mm in diameter. The no renal pelvis stone is
demonstrated.

Bladder:

The urinary bladder is moderately distended. There are bilateral
ureteral jets. A postvoid images could not be obtained due to the
patient's inability to cooperate.
IMPRESSION: 1. There are nonobstructing stones in both kidneys.
2. In the right kidney there is a central cystic appearing structure
with more peripheral midpole cystic structures. These corresponds to
the CT findings under grossly stable. Given the mural calcification
on the CT scan these are not felt to reflect simple cysts. The large
left renal pelvis stone previously demonstrated on the CT scan is
not evident on today's ultrasound.
3. The urinary bladder is normal.

## 2014-10-02 MED ORDER — CEFDINIR 300 MG PO CAPS: 300.0000 mg | ORAL_CAPSULE | Freq: Two times a day (BID) | ORAL | Status: DC

## 2014-10-02 NOTE — Progress Notes (Signed)
   Subjective:    Patient ID: [name] [name], female    DOB: 03/07/1924, 80 y.o.   MRN: [id number]  Wheezing  This is a new problem. The current episode started in the past 7 days. The problem occurs intermittently. The problem has been unchanged. Associated symptoms include coughing. Associated symptoms comments: Back pain, congestion. Nothing aggravates the symptoms. She has tried OTC cough suppressant for the symptoms. The treatment provided no relief.   Patient has some right eye redness and swelling also.   No fever  Review of Systems  Respiratory: Positive for cough and wheezing.        Objective:   Physical Exam  Eardrums normal extremities moist neck no masses within the body of the lung there are no crackles or wheezes. There is some supra glottic wheezing noted.      Assessment & Plan:  Viral syndrome possibility secondary bronchitis also I doubt that there is pneumonia going on I don't recommend x-rays or blood work I do recommend antibiotics  I don't believe this is true wheezing would not recommend albuterol she has had this problem before and has seen a pulmonologist and ENT in neither one seem to have any strong conclusions. When the patient was talking she actually stop doing this area  Family states that GI doctor recently reduced her Prilosec from 40 mg twice a day to a new dose of 20 mg twice a day they're concerned that reflux could worsen I encouraged them to try at the new dose and if problems notify us

## 2014-10-07 DIAGNOSIS — H01001 Unspecified blepharitis right upper eyelid: Secondary | ICD-10-CM | POA: Diagnosis not present

## 2014-10-07 DIAGNOSIS — H01002 Unspecified blepharitis right lower eyelid: Secondary | ICD-10-CM | POA: Diagnosis not present

## 2014-10-07 DIAGNOSIS — H01004 Unspecified blepharitis left upper eyelid: Secondary | ICD-10-CM | POA: Diagnosis not present

## 2014-10-13 ENCOUNTER — Other Ambulatory Visit: Payer: Self-pay | Admitting: Family Medicine

## 2014-10-24 ENCOUNTER — Ambulatory Visit: Payer: Medicare Other | Admitting: Neurology

## 2014-10-24 ENCOUNTER — Ambulatory Visit (INDEPENDENT_AMBULATORY_CARE_PROVIDER_SITE_OTHER): Payer: Medicare Other | Admitting: Neurology

## 2014-10-24 ENCOUNTER — Encounter: Payer: Self-pay | Admitting: Neurology

## 2014-10-24 VITALS — BP 114/72 | HR 85 | Temp 98.3°F

## 2014-10-24 DIAGNOSIS — F0390 Unspecified dementia without behavioral disturbance: Secondary | ICD-10-CM | POA: Diagnosis not present

## 2014-10-24 DIAGNOSIS — G250 Essential tremor: Secondary | ICD-10-CM | POA: Diagnosis not present

## 2014-10-24 NOTE — Progress Notes (Signed)
Subjective:    Patient ID: [name] [name] is a 80 y.o. female.  HPI     Interim history:   [name] [name] is a very pleasant 80 year old right-handed woman with an underlying compicated medical history of coronary artery disease, hypertension, hyperlipidemia, diabetes, degenerative joint disease, status post right total knee replacement in 2006, upper GI bleed secondary to stress ulcer in 2003, retinal artery branch occlusion of the right eye secondary to embolism suspected, reflux disease with hiatal hernia, osteoporosis, left lower lobe lung nodule, cervical spondylosis, status post discectomy, diverticulosis, and kidney stones, who presents for followup consultation of her advanced dementia, gait disturbance, and tremors consistent with essential tremor. She is accompanied by her granddaughter (has POA) again today. I last saw her on 05/16/2014, at which time her family reported that she had an improved cough and wheezing. She was essentially wheelchair-bound at the time. She had not been walking. Mysoline was felt to be helpful for her tremors. She had had hospitalizations for kidney stone extraction and stent removal. I suggested we continue with the current medications are made no changes as she was fairly stable at the time.  Today, her granddaughter reports that the patient recently had a upper respiratory infection and was treated with antibiotics and has done better. She is stable on her memory and tremor medication. Her wheezing has returned to some degree after she had the URI. There are no new concerns. She has not recently fallen. She does need assistance with feeding at this time.    I saw her on 12/20/2013, at which her GD reported, that patient was eating well. She was tolerating Aricept 5 mg well, no connection between her diarrhea in December 2014 and she was less agitated. She had intermittent wheezing still. In the interim she was seen by Dr. Chase Caller in pulmonary medicine. She was  seen in the emergency room on 01/12/2014 with shortness of breath. She was admitted to the hospital on 03/25/2014 for kidney stones and had lithotripsy on 03/25/2014. She was readmitted to the hospital on 04/10/2014 with stent removal to her ureter and kidney stone removal.   I saw her on 07/04/2013, at which time I suggested consideration of placing her back on Aricept. She was just on Namenda at the time. In the interim, she was re-started on Aricept low-dose by her primary care physician. It had previously been stopped because of cost and chronic cough as I understand. She developed right wrist cellulitis in October. In December she developed more agitation. On 10/25/2013 she was admitted to the hospital for a day because of nausea, vomiting and diarrhea. She was discharged back to her assisted-living facility. She was readmitted to the hospital on 10/29/2013 because of recurrence of vomiting. She had an EGD on 10/31/2013, which showed a large hiatal hernia but no ulcer or bleeding. It was felt that she had multifactorial abdominal pain and vomiting. Speech therapy was consulted. She was placed on a dysphagia diet. She also had a head CT on 10/29/2013 which I reviewed: Atrophy with small vessel chronic ischemic changes of deep cerebral white matter. Question tiny old lacunar infarct right thalamus. No acute intracranial abnormalities. In addition, personally reviewed the images through the PACS system.   I previously reviewed her hospital records from her admission from 12/5 through 10/26/2013 as well as from her admission from 12/09 to 10/31/13. She was also seen in the emergency room recently on 11/22/2013 for a cough. She was diagnosed with a viral syndrome. She had  a chest x-ray which was negative for pneumonia. She was symptomatically treated with albuterol and prednisone.   I saw her on 01/11/2014, at which time I felt she had advanced dementia, gait disturbance, and tremors consistent with ET and no  evidence of parkinsonism.   I first saw her on 12/21/2012 and suggested a trial of Mysoline. She had a fall and was seen in the emergency room on 06/16/2013. She had no overt injury. She lives at the [facility] in the assisted living facility. She had a head CT which did not show any intracranial hemorrhage, but advanced atrophy. She had a cervical spine CT which did not show any fracture. I originally started her on a very small dose of Mysoline in January, a quarter of a pill and then increase it in February 2 half a pill each night. I discussed with her granddaughter that Mysoline does have a tendency to sedate and cause balance problems but in light of her being stable on the same dose I did not think it was connected to her fall. Mysoline helped her hand tremors and helped her feed herself. She has had dementia for years. Per granddaughter she was originally treated with Aricept and then Namenda was added and for some reason several years ago Aricept was discontinued.   Her Past Medical History Is Significant For: Past Medical History  Diagnosis Date  . ASCVD (arteriosclerotic cardiovascular disease) 2003    Single vessel disease-60% D1  . Hypertension   . Hyperlipidemia   . Leukopenia     and anemia-hematology evaluation was nodiagnostic  . Degenerative joint disease     right TKA in 4/06  . Upper GI bleed     gastric stress ulcer in 2003  . Retinal artery branch occlusion of right eye     Sudden onset of right eye blindness-embolism suspected  . Gastroesophageal reflux disease with hiatal hernia   . Osteoporosis   . Lung nodule     Left lower lobe  . Parkinson's disease     PRESUMED BECAUSE SHE HAS A LOT OF SHAKING - BUT HAS SEEN NEUROLOGIST  AND WAS PUT ON MYSOLINE AND FAMILY HAS SEEN BIG IMPROVEMENT IN THE SHAKING  . Cervical spondylarthritis     S/p discectomy  . Diverticulosis   . Hiatal hernia   . Leukocytosis   . Hyperlipidemia   . Degenerative joint disease   . GI  bleed   . Cervical spondylarthritis   . Diverticulosis   . Shortness of breath     AND WHEEZING WITH ACTIVITY AND AT REST  . Coronary artery disease   . 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 Diabetes mellitus     No insulin- NO MEDS - LOST WEIGHT - NO LONGER HAS ELEVATED SUGARS  . History of nephrolithiasis   . Dementia     PT RESIDES AT [facility] - SHE TALKS SOME - DOES NOT AMBULATE- TRANSFERS TO W/C WITH 2 ASSISTANTS  . Alzheimer disease   . Senile dementia   . Alzheimer's disease     Her Past Surgical History Is Significant For: Past Surgical History  Procedure Laterality Date  . Total abdominal hysterectomy    . Cervical discectomy      Dr. Sherwood Gambler  . Cholecystectomy    . Total knee arthroplasty  2006    Right; Dr. Percell Miller  . Colonoscopy  2009  .  Joint replacement    . Cystoscopy w/ ureteral stent placement  09/23/2011    Procedure: CYSTOSCOPY WITH RETROGRADE PYELOGRAM/URETERAL STENT PLACEMENT;  Surgeon: Marissa Nestle;  Location: [facility];  Service: Urology;  Laterality: Right;  . Esophagogastroduodenoscopy N/A 10/31/2013    Procedure: ESOPHAGOGASTRODUODENOSCOPY (EGD);  Surgeon: Rogene Houston, MD;  Location: [facility];  Service: Endoscopy;  Laterality: N/A;  . Vascular surgery      LASER OF RT EYE AFTER EYE STROKE  . Cystoscopy/retrograde/ureteroscopy Left 03/25/2014    Procedure: CYSTOSCOPY WITH  LEFT URETEROSCOPY AND LEFT RETROGRADE  STENT PLACEMENT;  Surgeon: Irine Seal, MD;  Location: [facility];  Service: Urology;  Laterality: Left;  . Holmium laser application Left 07/24/7901    Procedure: HOLMIUM LASER APPLICATION;  Surgeon: Irine Seal, MD;  Location: [facility];  Service: Urology;  Laterality: Left;  . Cystoscopy/retrograde/ureteroscopy/stone extraction with basket Left 04/10/2014    Procedure: LEFT SECOND STAGE URETEORSCOPY STONE EXTRACTION;  Surgeon: Malka So, MD;  Location: [facility];  Service:  Urology;  Laterality: Left;  With STENT  . Holmium laser application  02/28/7352    Procedure: HOLMIUM LASER APPLICATION;  Surgeon: Malka So, MD;  Location: [facility];  Service: Urology;;    Her Family History Is Significant For: Family History  Problem Relation Age of Onset  . Diabetes Sister   . Heart disease Sister   . Diabetes Brother     Her Social History Is Significant For: History   Social History  . Marital Status: Widowed    Spouse Name: N/A    Number of Children: N/A  . Years of Education: N/A   Social History Main Topics  . Smoking status: Never Smoker   . Smokeless tobacco: Never Used  . Alcohol Use: No  . Drug Use: No  . Sexual Activity: No   Other Topics Concern  . None   Social History Narrative   Retired from [employer]     Her Allergies Are:  Allergies  Allergen Reactions  . Codeine Other (See Comments)    REACTION: Unknown  . Dilaudid [Hydromorphone Hcl]     Caused agitation, hallucinations, could not sleep, pt made funny noises like laughing / crying.  . Morphine Nausea And Vomiting  . Other     NO CITRUS, TOMATO BASED PRODUCTS OR CHOCOLATE - BECAUSE OF REFLUX PT'S FAMILY STATES THAT ANY TIME PT IS GIVEN NARCOTICS SHE HALLUCINATES AND TRIES TO GET UP OUT W/C OR BED Medications have to be crushed and put in apple sauce.  :   Her Current Medications Are:  Outpatient Encounter Prescriptions as of 10/24/2014  Medication Sig  . acetaminophen (TYLENOL) 500 MG tablet Take 1,000 mg by mouth every 6 (six) hours as needed for mild pain or moderate pain.  . ASPIRIN LOW DOSE 81 MG EC tablet TAKE 1 TABLET BY MOUTH ONCE DAILY.  . B Complex-C (B-COMPLEX WITH VITAMIN C) tablet TAKE 1 TABLET BY MOUTH ONCE DAILY.  . bacitracin-polymyxin b (POLYSPORIN) ophthalmic ointment Place 1 application into both eyes at bedtime. apply to eye every 12 hours while awake  . BENICAR 20 MG tablet TAKE 1 TABLET BY MOUTH ONCE DAILY.  . cefdinir (OMNICEF) 300 MG  capsule Take 1 capsule (300 mg total) by mouth 2 (two) times daily. For 10 days  . cefPROZIL (CEFZIL) 500 MG tablet   . citalopram (CELEXA) 20 MG tablet TAKE 1 TABLET BY MOUTH ONCE DAILY.(FOR DEPRESSION)  . Difluprednate (DUREZOL) 0.05 % EMUL Place  1 drop into the right eye at bedtime.   . donepezil (ARICEPT) 5 MG tablet TAKE 1 TABLET BY MOUTH EACH MORNING.  Marland Kitchen erythromycin ophthalmic ointment Place 1 application into both eyes daily.  . hydrochlorothiazide (MICROZIDE) 12.5 MG capsule   . hydroxypropyl methylcellulose (ISOPTO TEARS) 2.5 % ophthalmic solution Place 1 drop into both eyes 3 (three) times daily as needed for dry eyes.  Marland Kitchen loperamide (IMODIUM) 2 MG capsule   . loratadine (CLARITIN) 10 MG tablet TAKE 1 TABLET BY MOUTH ONCE DAILY.(FOR ALLERGIC RHINITIS)  . memantine (NAMENDA) 10 MG tablet TAKE 1 TABLET BY MOUTH TWICE DAILY.  Marland Kitchen omeprazole (PRILOSEC) 20 MG capsule   . omeprazole (PRILOSEC) 40 MG capsule TAKE 1 CAPSULE BY MOUTH TWICE DAILY FOR ACID REFLUX.  Marland Kitchen ondansetron (ZOFRAN-ODT) 4 MG disintegrating tablet Take 4 mg by mouth every 6 (six) hours as needed for nausea or vomiting.  . polyethylene glycol powder (GLYCOLAX/MIRALAX) powder MIX 1 CAPFUL (17G) IN 8 OUNCES OF JUICE/WATER AND DRINK ONCE DAILY.  Marland Kitchen primidone (MYSOLINE) 50 MG tablet TAKE 1/2 TABLET (25mg ) BY MOUTH AT BEDTIME.  Marland Kitchen Q-PAP 500 MG tablet TAKE 1 TABLET BY MOUTH TWICE DAILY.  . traMADol (ULTRAM) 50 MG tablet Take 1 tablet (50 mg total) by mouth every 6 (six) hours as needed for moderate pain.  :  Review of Systems:  Out of a complete 14 point review of systems, all are reviewed and negative with the exception of these symptoms as listed below:   Review of Systems  All other systems reviewed and are negative.  Occasional wheeze  Objective:  Neurologic Exam  Physical Exam Physical Examination:   Filed Vitals:   10/24/14 1201  BP: 114/72  Pulse: 85  Temp: 98.3 F (36.8 C)    On general exam: The patient is  a frail-appearing elderly lady situated in a wheelchair in no apparent distress. She is somewhat cooperative but is unable to perform more than one step tasks and has even trouble with mimicking. She is unable to provide any history. She answers questions in one or 2 word sentences.   HEENT exam. She has right enophthalmos and right ptosis, unchanged as well as poor tracking. Face is symmetric with fairly normal facial animation. She has no lip or neck tremor today. Neck is fairly supple once she is able to relax. Full range of motion is noted. Hearing is impaired. Speech is very mildly dysarthric and at times difficult to understand, unchanged.  Chest auscultation demonstrates no wheezing. Heart sounds are normal. Abdominal sounds are normal, and abdomen is soft and nontender. She has no pitting edema in the distal lower extremities. Mucosal membranes are mildly dry and skin is warm and dry.  Neurologically: Mental status: The patient is awake and paying little attention. She is not oriented except for self.  On 05/16/2014: MMSE: 4/30, CDT: 0/4, AFT: 0/min.  On 10/24/2014: MMSE: 2/30, CDT: 0/4, AFT: 0/min.   Cranial nerves are as described under HEENT exam. Motor exam: thin bulk is noted. Tone is fairly N. Strength is 4/5 globally. She does not exhibit much in the way of tremors in the upper extremities today. Fine motor skills are mildly impaired in both upper and lower extremities. I did not have her stand or walk for me today. Sensory exam seems intact to light touch. She is not able to participate for finger to nose testing or more extensive sensory testing.  Assessment and Plan:   In summary, [name] [name] is a very  pleasant 80 year old right-handed female with an underlying complex medical history of coronary artery disease, hypertension, hyperlipidemia, diabetes, degenerative joint disease, status post right total knee replacement in 2006, upper GI bleed secondary to stress ulcer in 2003, retinal  artery branch occlusion of the right eye secondary to embolism suspected, reflux disease with hiatal hernia, osteoporosis, left lower lobe lung nodule, cervical spondylosis, status post discectomy, diverticulosis, and kidney stones, who presents for followup consultation of her advanced dementia, and tremors. She has remained fairly stable. We have not made much in the way of changes in her medication regimen in the last couple of visits. I would like to continue her on Aricept 5 mg, Namenda 10 mg twice daily, and even though she has not made much in the way of improvement cognitively, she seems to tolerate these medications well and we can continue with them. Her granddaughter is in agreement. We will continue low-dose Mysoline for tremor control symptomatically as it has worked well. I suggested at this point follow-up with primary care physician and as needed follow-up with me.  The patient's granddaughter was in agreement.

## 2014-10-24 NOTE — Patient Instructions (Signed)
We will continue the same medications. We have reached a level of plateau/stability and have not changed your medications in a while. I would suggest you follow up with your primary doctor from now on. I would be happy to see you back as needed.

## 2014-10-26 ENCOUNTER — Emergency Department (HOSPITAL_COMMUNITY): Payer: Medicare Other

## 2014-10-26 ENCOUNTER — Encounter (HOSPITAL_COMMUNITY): Payer: Self-pay

## 2014-10-26 ENCOUNTER — Emergency Department (HOSPITAL_COMMUNITY)
Admission: EM | Admit: 2014-10-26 | Discharge: 2014-10-26 | Disposition: A | Discharge status: Home / Self Care | Payer: Medicare Other | Attending: Emergency Medicine | Admitting: Emergency Medicine

## 2014-10-26 DIAGNOSIS — G309 Alzheimer's disease, unspecified: Secondary | ICD-10-CM | POA: Insufficient documentation

## 2014-10-26 DIAGNOSIS — Z8673 Personal history of transient ischemic attack (TIA), and cerebral infarction without residual deficits: Secondary | ICD-10-CM | POA: Insufficient documentation

## 2014-10-26 DIAGNOSIS — R109 Unspecified abdominal pain: Secondary | ICD-10-CM | POA: Diagnosis not present

## 2014-10-26 DIAGNOSIS — K219 Gastro-esophageal reflux disease without esophagitis: Secondary | ICD-10-CM | POA: Insufficient documentation

## 2014-10-26 DIAGNOSIS — K449 Diaphragmatic hernia without obstruction or gangrene: Secondary | ICD-10-CM | POA: Diagnosis not present

## 2014-10-26 DIAGNOSIS — E119 Type 2 diabetes mellitus without complications: Secondary | ICD-10-CM | POA: Diagnosis not present

## 2014-10-26 DIAGNOSIS — I10 Essential (primary) hypertension: Secondary | ICD-10-CM | POA: Diagnosis not present

## 2014-10-26 DIAGNOSIS — Z87442 Personal history of urinary calculi: Secondary | ICD-10-CM | POA: Diagnosis not present

## 2014-10-26 DIAGNOSIS — F0280 Dementia in other diseases classified elsewhere without behavioral disturbance: Secondary | ICD-10-CM | POA: Insufficient documentation

## 2014-10-26 DIAGNOSIS — K5641 Fecal impaction: Secondary | ICD-10-CM | POA: Insufficient documentation

## 2014-10-26 DIAGNOSIS — Z862 Personal history of diseases of the blood and blood-forming organs and certain disorders involving the immune mechanism: Secondary | ICD-10-CM | POA: Diagnosis not present

## 2014-10-26 DIAGNOSIS — Z79899 Other long term (current) drug therapy: Secondary | ICD-10-CM | POA: Insufficient documentation

## 2014-10-26 DIAGNOSIS — Z8739 Personal history of other diseases of the musculoskeletal system and connective tissue: Secondary | ICD-10-CM | POA: Insufficient documentation

## 2014-10-26 DIAGNOSIS — I2510 Atherosclerotic heart disease of native coronary artery without angina pectoris: Secondary | ICD-10-CM | POA: Insufficient documentation

## 2014-10-26 DIAGNOSIS — G20 Parkinson's disease: Secondary | ICD-10-CM | POA: Insufficient documentation

## 2014-10-26 DIAGNOSIS — R1084 Generalized abdominal pain: Secondary | ICD-10-CM | POA: Diagnosis present

## 2014-10-26 LAB — URINALYSIS, ROUTINE W REFLEX MICROSCOPIC
BILIRUBIN URINE: NEGATIVE
Glucose, UA: NEGATIVE mg/dL
HGB URINE DIPSTICK: NEGATIVE
Ketones, ur: NEGATIVE mg/dL
Leukocytes, UA: NEGATIVE
NITRITE: NEGATIVE
PROTEIN: NEGATIVE mg/dL
Specific Gravity, Urine: 1.025 (ref 1.005–1.030)
UROBILINOGEN UA: 0.2 mg/dL (ref 0.0–1.0)
pH: 5.5 (ref 5.0–8.0)

## 2014-10-26 LAB — CBC WITH DIFFERENTIAL/PLATELET
BASOS PCT: 0 % (ref 0–1)
Basophils Absolute: 0 10*3/uL (ref 0.0–0.1)
EOS ABS: 0.1 10*3/uL (ref 0.0–0.7)
Eosinophils Relative: 1 % (ref 0–5)
HCT: 33.7 % — ABNORMAL LOW (ref 36.0–46.0)
HEMOGLOBIN: 11 g/dL — AB (ref 12.0–15.0)
LYMPHS ABS: 1.5 10*3/uL (ref 0.7–4.0)
Lymphocytes Relative: 21 % (ref 12–46)
MCH: 33.7 pg (ref 26.0–34.0)
MCHC: 32.6 g/dL (ref 30.0–36.0)
MCV: 103.4 fL — ABNORMAL HIGH (ref 78.0–100.0)
Monocytes Absolute: 0.7 10*3/uL (ref 0.1–1.0)
Monocytes Relative: 9 % (ref 3–12)
NEUTROS PCT: 69 % (ref 43–77)
Neutro Abs: 4.7 10*3/uL (ref 1.7–7.7)
Platelets: 228 10*3/uL (ref 150–400)
RBC: 3.26 MIL/uL — ABNORMAL LOW (ref 3.87–5.11)
RDW: 13.3 % (ref 11.5–15.5)
WBC: 7 10*3/uL (ref 4.0–10.5)

## 2014-10-26 LAB — LIPASE, BLOOD: LIPASE: 42 U/L (ref 11–59)

## 2014-10-26 LAB — COMPREHENSIVE METABOLIC PANEL
ALK PHOS: 42 U/L (ref 39–117)
ALT: 19 U/L (ref 0–35)
ANION GAP: 11 (ref 5–15)
AST: 17 U/L (ref 0–37)
Albumin: 3.8 g/dL (ref 3.5–5.2)
BUN: 30 mg/dL — AB (ref 6–23)
CO2: 24 mEq/L (ref 19–32)
Calcium: 9.3 mg/dL (ref 8.4–10.5)
Chloride: 106 mEq/L (ref 96–112)
Creatinine, Ser: 1.18 mg/dL — ABNORMAL HIGH (ref 0.50–1.10)
GFR calc non Af Amer: 40 mL/min — ABNORMAL LOW (ref >90)
GFR, EST AFRICAN AMERICAN: 47 mL/min — AB (ref >90)
GLUCOSE: 116 mg/dL — AB (ref 70–99)
POTASSIUM: 4.9 meq/L (ref 3.7–5.3)
Sodium: 141 mEq/L (ref 137–147)
TOTAL PROTEIN: 7.2 g/dL (ref 6.0–8.3)
Total Bilirubin: 0.3 mg/dL (ref 0.3–1.2)

## 2014-10-26 MED ORDER — IOHEXOL 300 MG/ML  SOLN
100.0000 mL | Freq: Once | INTRAMUSCULAR | Status: AC | PRN
  Administered 2014-10-26: 100 mL via INTRAVENOUS

## 2014-10-26 MED ORDER — IOHEXOL 300 MG/ML  SOLN: 50.0000 mL | Freq: Once | INTRAMUSCULAR | Status: DC | PRN

## 2014-10-26 MED ORDER — MINERAL OIL RE ENEM: 1.0000 | ENEMA | Freq: Once | RECTAL | Status: DC

## 2014-10-26 MED ORDER — IOHEXOL 300 MG/ML  SOLN
50.0000 mL | Freq: Once | INTRAMUSCULAR | Status: AC | PRN
  Administered 2014-10-26: 50 mL via ORAL

## 2014-10-26 MED ORDER — PEG 3350-KCL-NABCB-NACL-NASULF 240 G PO SOLR: 4000.0000 mL | Freq: Once | ORAL | Status: DC

## 2014-10-26 MED ORDER — SODIUM CHLORIDE 0.9 % IV SOLN
1000.0000 mL | Freq: Once | INTRAVENOUS | Status: AC
  Administered 2014-10-26: 1000 mL via INTRAVENOUS

## 2014-10-26 MED ORDER — SODIUM CHLORIDE 0.9 % IV BOLUS (SEPSIS)
500.0000 mL | Freq: Once | INTRAVENOUS | Status: AC
  Administered 2014-10-26: 500 mL via INTRAVENOUS

## 2014-10-26 MED ORDER — MAGNESIUM CITRATE PO SOLN: 1.0000 | Freq: Once | ORAL | Status: DC

## 2014-10-26 MED ORDER — ONDANSETRON HCL 4 MG/2ML IJ SOLN
4.0000 mg | Freq: Once | INTRAMUSCULAR | Status: AC
  Administered 2014-10-26: 4 mg via INTRAVENOUS
  Filled 2014-10-26: qty 2

## 2014-10-26 NOTE — ED Notes (Signed)
Pt here from [facility] foe evaluation of abdominal pain. Pt states she doesn't hurt anywhere but she has dementia. No verbal complaint or grimace when nurse pressing on belly.

## 2014-10-26 NOTE — Discharge Instructions (Signed)
Please use enema and, then, polyethylene glycol can be administered.  Recheck with Dr. Wolfgang Phoenix if not better in 24 hours or return if worse at any time.   Fecal Impaction A fecal impaction happens when there is a large, firm amount of stool (or feces) that cannot be passed. The impacted stool is usually in the rectum, which is the lowest part of the large bowel. The impacted stool can block the colon and cause significant problems. CAUSES  The longer stool stays in the rectum, the harder it gets. Anything that slows down your bowel movements can lead to fecal impaction, such as:  Constipation. This can be a long-standing (chronic) problem or can happen suddenly (acute).  Painful conditions of the rectum, such as hemorrhoids or anal fissures. The pain of these conditions can make you try to avoid having bowel movements.  Narcotic pain-relieving medicines, such as methadone, morphine, or codeine.  Not drinking enough fluids.  Inactivity and bed rest over long periods of time.  Diseases of the brain or nervous system that damage the nerves controlling the muscles of the intestines. SIGNS AND SYMPTOMS   Lack of normal bowel movements or changes in bowel patterns.  Sense of fullness in the rectum but unable to pass stool.  Pain or cramps in the abdominal area (often after meals).  Thin, watery discharge from the rectum. DIAGNOSIS  Your health care provider may suspect that you have a fecal impaction based on your symptoms and a physical exam. This will include an exam of your rectum. Sometimes X-rays or lab testing may be needed to confirm the diagnosis and to be sure there are no other problems.  TREATMENT   Initially an impaction can be removed manually. Using a gloved finger, your health care provider can remove hard stool from your rectum.  Medicine is sometimes needed. A suppository or enema can be given in the rectum to soften the stool, which can stimulate a bowel movement.  Medicines can also be given by mouth (orally).  Though rare, surgery may be needed if the colon has torn (perforated) due to blockage. HOME CARE INSTRUCTIONS   Develop regular bowel habits. This could include getting in the habit of having a bowel movement after your morning cup of coffee or after eating. Be sure to allow yourself enough time on the toilet.  Maintain a high-fiber diet.  Drink enough fluids to keep your urine clear or pale yellow as directed by your health care provider.  Exercise regularly.  If you begin to get constipated, increase the amount of fiber in your diet. Eat plenty of fruits, vegetables, whole wheat breads, bran, oatmeal, and similar products.  Take natural fiber laxatives or other laxatives only as directed by your health care provider. SEEK MEDICAL CARE IF:   You have ongoing rectal pain.  You require enemas or suppositories more than twice a week.  You have rectal bleeding.  You have continued problems, or you develop abdominal pain.  You have thin, pencil-like stools. SEEK IMMEDIATE MEDICAL CARE IF:  You have black or tarry stools. MAKE SURE YOU:   Understand these instructions.  Will watch your condition.  Will get help right away if you are not doing well or get worse. Document Released: 07/30/2004 Document Revised: 08/28/2013 Document Reviewed: 05/14/2013 [facility] Patient Information 2015 [place], [place]. This information is not intended to replace advice given to you by your health care provider. Make sure you discuss any questions you have with your health care provider.

## 2014-10-26 NOTE — ED Provider Notes (Signed)
CSN: [id number]     Arrival date & time 10/26/14  5277 History  This chart was scribed for Shaune Pollack, MD by Jeanell Sparrow, ED Scribe. This patient was seen in room APA08/APA08 and the patient's care was started at 10:03 AM.   Chief Complaint  Patient presents with  . Abdominal Pain   Patient is a 80 y.o. female presenting with abdominal pain. The history is provided by a relative. No language interpreter was used.  Abdominal Pain Pain location:  Generalized Pain radiates to:  Does not radiate Pain severity:  Moderate Onset quality:  Gradual Duration:  1 day Timing:  Constant Progression:  Unchanged Chronicity:  New Relieved by:  None tried Worsened by:  Nothing tried Ineffective treatments:  None tried Associated symptoms: no diarrhea, no nausea and no vomiting    HPI Comments: [name] [name] is a 80 y.o. female who presents to the Emergency Department complaining of constant moderate generalized abdominal pain that started yesterday. Family member states that pt was seen here from [place] for evaluation of abdominal pain. She states that pt has dementia and is usually in a wheelchair. She denies any hx of smoking in pt. She denies any nausea, vomiting, or diarrhea in pt.    PCP- Wolfgang Phoenix Past Medical History  Diagnosis Date  . ASCVD (arteriosclerotic cardiovascular disease) 2003    Single vessel disease-60% D1  . Hypertension   . Hyperlipidemia   . Leukopenia     and anemia-hematology evaluation was nodiagnostic  . Degenerative joint disease     right TKA in 4/06  . Upper GI bleed     gastric stress ulcer in 2003  . Retinal artery branch occlusion of right eye     Sudden onset of right eye blindness-embolism suspected  . Gastroesophageal reflux disease with hiatal hernia   . Osteoporosis   . Lung nodule     Left lower lobe  . Parkinson's disease     PRESUMED BECAUSE SHE HAS A LOT OF SHAKING - BUT HAS SEEN NEUROLOGIST  AND WAS PUT ON MYSOLINE  AND FAMILY HAS SEEN BIG IMPROVEMENT IN THE SHAKING  . Cervical spondylarthritis     S/p discectomy  . Diverticulosis   . Hiatal hernia   . Leukocytosis   . Hyperlipidemia   . Degenerative joint disease   . GI bleed   . Cervical spondylarthritis   . Diverticulosis   . Shortness of breath     AND WHEEZING WITH ACTIVITY AND AT REST  . Coronary artery disease   . 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 Diabetes mellitus     No insulin- NO MEDS - LOST WEIGHT - NO LONGER HAS ELEVATED SUGARS  . History of nephrolithiasis   . Dementia     PT RESIDES AT [facility] [place] [facility] - SHE TALKS SOME - DOES NOT AMBULATE- TRANSFERS TO W/C WITH 2 ASSISTANTS  . Alzheimer disease   . Senile dementia   . Alzheimer's disease    Past Surgical History  Procedure Laterality Date  . Total abdominal hysterectomy    . Cervical discectomy      Dr. Sherwood Gambler  . Cholecystectomy    . Total knee arthroplasty  2006    Right; Dr. Percell Miller  . Colonoscopy  2009  . Joint replacement    . Cystoscopy w/ ureteral stent placement  09/23/2011    Procedure: CYSTOSCOPY  WITH RETROGRADE PYELOGRAM/URETERAL STENT PLACEMENT;  Surgeon: Marissa Nestle;  Location: [facility];  Service: Urology;  Laterality: Right;  . Esophagogastroduodenoscopy N/A 10/31/2013    Procedure: ESOPHAGOGASTRODUODENOSCOPY (EGD);  Surgeon: Rogene Houston, MD;  Location: [facility];  Service: Endoscopy;  Laterality: N/A;  . Vascular surgery      LASER OF RT EYE AFTER EYE STROKE  . Cystoscopy/retrograde/ureteroscopy Left 03/25/2014    Procedure: CYSTOSCOPY WITH  LEFT URETEROSCOPY AND LEFT RETROGRADE  STENT PLACEMENT;  Surgeon: Irine Seal, MD;  Location: [facility];  Service: Urology;  Laterality: Left;  . Holmium laser application Left 11/26/3844    Procedure: HOLMIUM LASER APPLICATION;  Surgeon: Irine Seal, MD;  Location: [facility];  Service: Urology;  Laterality: Left;  .  Cystoscopy/retrograde/ureteroscopy/stone extraction with basket Left 04/10/2014    Procedure: LEFT SECOND STAGE URETEORSCOPY STONE EXTRACTION;  Surgeon: Malka So, MD;  Location: [facility];  Service: Urology;  Laterality: Left;  With STENT  . Holmium laser application  6/59/9357    Procedure: HOLMIUM LASER APPLICATION;  Surgeon: Malka So, MD;  Location: [facility];  Service: Urology;;   Family History  Problem Relation Age of Onset  . Diabetes Sister   . Heart disease Sister   . Diabetes Brother    History  Substance Use Topics  . Smoking status: Never Smoker   . Smokeless tobacco: Never Used  . Alcohol Use: No   OB History    No data available     Review of Systems  Gastrointestinal: Positive for abdominal pain. Negative for nausea, vomiting and diarrhea.    Allergies  Band-aid liquid bandage; Codeine; Dilaudid; Morphine; and Other  Home Medications   Prior to Admission medications   Medication Sig Start Date End Date Taking? Authorizing Provider  acetaminophen (TYLENOL) 325 MG tablet Take 650 mg by mouth every 6 (six) hours as needed for mild pain or fever.   Yes Historical Provider, MD  acetaminophen (TYLENOL) 500 MG tablet Take 1,000 mg by mouth every 6 (six) hours as needed for mild pain or moderate pain.   Yes Historical Provider, MD  ASPIRIN LOW DOSE 81 MG EC tablet TAKE 1 TABLET BY MOUTH ONCE DAILY. 05/28/14  Yes Kathyrn Drown, MD  B Complex-C (B-COMPLEX WITH VITAMIN C) tablet TAKE 1 TABLET BY MOUTH ONCE DAILY. 10/13/14  Yes Kathyrn Drown, MD  BENICAR 20 MG tablet TAKE 1 TABLET BY MOUTH ONCE DAILY. 08/27/14  Yes Kathyrn Drown, MD  citalopram (CELEXA) 20 MG tablet TAKE 1 TABLET BY MOUTH ONCE DAILY.(FOR DEPRESSION) 07/29/14  Yes Kathyrn Drown, MD  Difluprednate (DUREZOL) 0.05 % EMUL Place 1 drop into the right eye at bedtime.    Yes Historical Provider, MD  donepezil (ARICEPT) 5 MG tablet TAKE 1 TABLET BY MOUTH EACH MORNING. 08/04/14  Yes Kathyrn Drown, MD  ENSURE PLUS  (ENSURE PLUS) LIQD Take 237 mLs by mouth 4 (four) times daily -  with meals and at bedtime.   Yes Historical Provider, MD  guaifenesin (ROBITUSSIN) 100 MG/5ML syrup Take 200 mg by mouth every 6 (six) hours as needed for cough or congestion.   Yes Historical Provider, MD  hydroxypropyl methylcellulose (ISOPTO TEARS) 2.5 % ophthalmic solution Place 1 drop into both eyes 3 (three) times daily as needed for dry eyes.   Yes Historical Provider, MD  loratadine (CLARITIN) 10 MG tablet TAKE 1 TABLET BY MOUTH ONCE DAILY.(FOR ALLERGIC RHINITIS) 08/06/14  Yes Kathyrn Drown, MD  memantine [facility])  10 MG tablet TAKE 1 TABLET BY MOUTH TWICE DAILY. 08/08/14  Yes Kathyrn Drown, MD  omeprazole (PRILOSEC) 20 MG capsule Take 20 mg by mouth 2 (two) times daily before a meal.  10/02/14  Yes Historical Provider, MD  polyethylene glycol powder (GLYCOLAX/MIRALAX) powder MIX 1 CAPFUL (17G) IN 8 OUNCES OF JUICE/WATER AND DRINK ONCE DAILY. 05/21/14  Yes Kathyrn Drown, MD  primidone (MYSOLINE) 50 MG tablet TAKE 1/2 TABLET (25mg ) BY MOUTH AT BEDTIME. 07/10/14  Yes Mikey Kirschner, MD  traMADol (ULTRAM) 50 MG tablet Take 1 tablet (50 mg total) by mouth every 6 (six) hours as needed for moderate pain. 03/26/14  Yes Malka So, MD  cefdinir (OMNICEF) 300 MG capsule Take 1 capsule (300 mg total) by mouth 2 (two) times daily. For 10 days Patient not taking: Reported on 10/26/2014 10/02/14   Kathyrn Drown, MD  loperamide (IMODIUM) 2 MG capsule Take 2 mg by mouth 2 (two) times daily as needed for diarrhea or loose stools.  09/12/14   Historical Provider, MD  omeprazole (PRILOSEC) 40 MG capsule TAKE 1 CAPSULE BY MOUTH TWICE DAILY FOR ACID REFLUX. Patient not taking: Reported on 10/26/2014 04/07/14   Kathyrn Drown, MD  ondansetron (ZOFRAN-ODT) 4 MG disintegrating tablet Take 4 mg by mouth every 6 (six) hours as needed for nausea or vomiting.    Historical Provider, MD  Q-PAP 500 MG tablet TAKE 1 TABLET BY MOUTH TWICE DAILY. Patient not  taking: Reported on 10/26/2014 06/30/14   Kathyrn Drown, MD   BP 107/71 mmHg  Pulse 72  Temp(Src) 98.5 F (36.9 C) (Oral)  Resp 18  SpO2 98% Physical Exam  Constitutional: She appears well-developed and well-nourished.  HENT:  Head: Normocephalic and atraumatic.  Right Ear: External ear normal.  Left Ear: External ear normal.  Nose: Nose normal.  Mouth/Throat: Oropharynx is clear and moist.  Eyes: Conjunctivae and EOM are normal. Pupils are equal, round, and reactive to light.  Neck: Normal range of motion. Neck supple.  Cardiovascular: Normal rate, regular rhythm, normal heart sounds and intact distal pulses.   Pulmonary/Chest: Effort normal and breath sounds normal.  Abdominal: Soft. Bowel sounds are normal.  Genitourinary:  Large amount of soft stool in rectal  Musculoskeletal: Normal range of motion.  Neurological: She is alert. She has normal reflexes.  Oriented to person place but not to date  Skin: Skin is warm and dry.  Psychiatric: She has a normal mood and affect.  Nursing note and vitals reviewed.   ED Course  Procedures (including critical care time) DIAGNOSTIC STUDIES: Oxygen Saturation is 98% on RA, normal by my interpretation.    COORDINATION OF CARE: 10:07 AM- Pt advised of plan for treatment and pt agrees.  Labs Review Labs Reviewed  CBC WITH DIFFERENTIAL - Abnormal; Notable for the following:    RBC 3.26 (*)    Hemoglobin 11.0 (*)    HCT 33.7 (*)    MCV 103.4 (*)    All other components within normal limits  COMPREHENSIVE METABOLIC PANEL - Abnormal; Notable for the following:    Glucose, Bld 116 (*)    BUN 30 (*)    Creatinine, Ser 1.18 (*)    GFR calc non Af Amer 40 (*)    GFR calc Af Amer 47 (*)    All other components within normal limits  LIPASE, BLOOD  URINALYSIS, ROUTINE W REFLEX MICROSCOPIC    Imaging Review Ct Abdomen Pelvis W Contrast  10/26/2014  CLINICAL DATA:  Moderate generalized abdominal pain started yesterday. Denies  nausea, vomiting or diarrhea.  EXAM: CT ABDOMEN AND PELVIS WITH CONTRAST  TECHNIQUE: Multidetector CT imaging of the abdomen and pelvis was performed using the standard protocol following bolus administration of intravenous contrast.  CONTRAST:  44mL OMNIPAQUE IOHEXOL 300 MG/ML SOLN, 125mL OMNIPAQUE IOHEXOL 300 MG/ML SOLN  COMPARISON:  03/04/2014  FINDINGS: There is bibasilar atelectasis. Normal heart size. There is mitral annular calcification.  The liver is diffusely low in attenuation likely secondary to hepatic steatosis. There is no intrahepatic or extrahepatic biliary ductal dilatation. The gallbladder is surgically absent. The spleen demonstrates no focal abnormality.There are bilateral extrarenal pelvis. There is a 4.3 x 4 cm cystic right lower lobe renal mass with multiple thin septations which are partially calcified, unchanged from the prior exam. The left kidney is atrophic. There is high-density material layering dependently in the left renal pelvis which may reflect debris. The adrenal glands and pancreas are normal. The bladder is unremarkable.  There is a large hiatal hernia. There is no small bowel dilatation or wall thickening. There is a large amount of stool in the rectum with rectal dilatation and mild wall thickening. There is no pneumoperitoneum, pneumatosis, or portal venous gas. There is no abdominal or pelvic free fluid. There is no lymphadenopathy.  The abdominal aorta is normal in caliber with atherosclerosis.  There are no lytic or sclerotic osseous lesions. There is lumbar spine spondylosis. There is a chronic L1 compression fracture. There is moderate osteoarthritis of bilateral hips.  IMPRESSION: 1. Rectal fecal impaction. 2. Stable septated cystic right lower pole renal mass similar in appearance to 09/21/2011. 3. Large hiatal hernia. 4. High attenuation dependently in the left renal pelvis likely representing debris or a small amount of blood. Correlate with urinalysis.    Electronically Signed   By: Kathreen Devoid   On: 10/26/2014 14:19     EKG Interpretation None      MDM   Final diagnoses:  Fecal impaction    80 year old female complained of pain at the nursing home today. Exam here reveals essentially normal laboratory workup. Physical exam revealed soft stool in the rectal vault. CT the abdomen reveals fecal impaction. I am unable to disimpact the patient as the stool is very soft.   Plan outpatient treatment and follow-up with primary care physician. I discussed this the patient's caregiver and family member.    Shaune Pollack, MD 10/26/14 [phone]

## 2014-10-27 ENCOUNTER — Other Ambulatory Visit (INDEPENDENT_AMBULATORY_CARE_PROVIDER_SITE_OTHER): Payer: Self-pay | Admitting: Internal Medicine

## 2014-10-30 ENCOUNTER — Telehealth: Payer: Self-pay | Admitting: Family Medicine

## 2014-10-30 NOTE — Telephone Encounter (Signed)
I rescheduled the pts appt for 12/11 to 12/18 due to pt load   The Christy the grand daughter has concern about her bowel movements,  She had to take her to the ED over the weekend for impaction. She wants to  Know what you would like to do for her at this point to help with keeping her  Regular outside of the miralax that she currently takes.

## 2014-10-31 ENCOUNTER — Ambulatory Visit: Payer: Medicare Other | Admitting: Family Medicine

## 2014-10-31 NOTE — Telephone Encounter (Signed)
First-this patient essentially sits all the time because of the dementia. This puts her at risk for impactions. Hopefully family understands that. Second-verify with nursing center that she is doing the MiraLAX once daily. Third-we need to know how often bowel movements are for this patient and if they are soft or firm. Fourth if the patient is having relatively infrequent or firm bowel movements MiraLAX can be moved up to twice daily. Fifth- we need to be certain that patient is not getting diarrhea medication so therefore will need to verify with the center how often she gets her when necessary diarrhea medicine, it is possible that may need to be discontinued

## 2014-10-31 NOTE — Telephone Encounter (Signed)
Spoke with [employer]. Rainee did have a normal, soft bm this morning. They are giving her MiraLAX daily. I faxed over an order for them to give an additional dose PRN if infrequent or firm bm. Nurse verbalized understanding. They are only giving antidiarrhea med if needed. Spoke with grand daughter as well and told her about wht Micky is at risk for impaction. She verbalized understanding.

## 2014-11-07 ENCOUNTER — Ambulatory Visit (INDEPENDENT_AMBULATORY_CARE_PROVIDER_SITE_OTHER): Payer: Medicare Other | Admitting: Family Medicine

## 2014-11-07 ENCOUNTER — Encounter: Payer: Self-pay | Admitting: Family Medicine

## 2014-11-07 VITALS — BP 128/74 | Ht 61.0 in | Wt 173.0 lb

## 2014-11-07 DIAGNOSIS — E118 Type 2 diabetes mellitus with unspecified complications: Secondary | ICD-10-CM

## 2014-11-07 DIAGNOSIS — K219 Gastro-esophageal reflux disease without esophagitis: Secondary | ICD-10-CM

## 2014-11-07 DIAGNOSIS — G309 Alzheimer's disease, unspecified: Secondary | ICD-10-CM | POA: Diagnosis not present

## 2014-11-07 DIAGNOSIS — I10 Essential (primary) hypertension: Secondary | ICD-10-CM

## 2014-11-07 DIAGNOSIS — F0280 Dementia in other diseases classified elsewhere without behavioral disturbance: Secondary | ICD-10-CM

## 2014-11-07 LAB — POCT GLYCOSYLATED HEMOGLOBIN (HGB A1C): Hemoglobin A1C: 5.3

## 2014-11-07 NOTE — Progress Notes (Signed)
   Subjective:    Patient ID: [name] [name], female    DOB: [birth year], 80 y.o.   MRN: [id number]  Diabetes She presents for her follow-up diabetic visit. She has type 2 diabetes mellitus.    Pt has been spitting up after she eats food. Family is wondering if her acid reflux med needs to go back to the 40mg .  Patient unable to tell us if any problems going on. Long discussion held with family 25 minutes spent with patient  Review of Systems No vomiting but having some regurgitation no bloody stools    Objective:   Physical Exam Lungs are clear hearts regular pulse normal extremities no edema skin warm dry       Assessment & Plan:  Significant reflux tried to eat small amounts frequently rather than large amount one time unfortunately she is at the mercy of what her care center does  Has a history of a fecal impaction I recommended that the center monitor how often her bowel movements occur to try to prevent this from reoccurring  Diabetes actually under great control no sign of problems currently  Dementia she will no longer be following with neurology she will be following with [place] we will continue medication  Tremor stable

## 2014-11-12 ENCOUNTER — Other Ambulatory Visit: Payer: Self-pay | Admitting: Family Medicine

## 2014-12-05 DIAGNOSIS — N390 Urinary tract infection, site not specified: Secondary | ICD-10-CM | POA: Diagnosis not present

## 2014-12-12 ENCOUNTER — Other Ambulatory Visit: Payer: Self-pay | Admitting: Family Medicine

## 2014-12-15 ENCOUNTER — Telehealth: Payer: Self-pay | Admitting: Family Medicine

## 2014-12-15 NOTE — Telephone Encounter (Signed)
A urinalysis was sent over by [facility] regarding her urine. It was a courtesy report. Other then a few white blood cells it was unremarkable. No sign of UTI. No antibiotics indicated.

## 2014-12-16 DIAGNOSIS — L84 Corns and callosities: Secondary | ICD-10-CM | POA: Diagnosis not present

## 2014-12-16 DIAGNOSIS — E1151 Type 2 diabetes mellitus with diabetic peripheral angiopathy without gangrene: Secondary | ICD-10-CM | POA: Diagnosis not present

## 2014-12-25 ENCOUNTER — Other Ambulatory Visit: Payer: Self-pay | Admitting: Family Medicine

## 2014-12-26 ENCOUNTER — Other Ambulatory Visit: Payer: Self-pay | Admitting: *Deleted

## 2014-12-30 ENCOUNTER — Other Ambulatory Visit: Payer: Self-pay | Admitting: Family Medicine

## 2015-01-02 ENCOUNTER — Ambulatory Visit: Payer: Medicare Other | Admitting: Neurology

## 2015-01-06 ENCOUNTER — Ambulatory Visit (INDEPENDENT_AMBULATORY_CARE_PROVIDER_SITE_OTHER): Payer: Medicare Other | Admitting: Internal Medicine

## 2015-01-06 ENCOUNTER — Encounter (INDEPENDENT_AMBULATORY_CARE_PROVIDER_SITE_OTHER): Payer: Self-pay | Admitting: Internal Medicine

## 2015-01-06 VITALS — BP 122/72 | HR 74 | Temp 98.2°F | Resp 18 | Ht 60.0 in | Wt 167.0 lb

## 2015-01-06 DIAGNOSIS — K449 Diaphragmatic hernia without obstruction or gangrene: Secondary | ICD-10-CM

## 2015-01-06 DIAGNOSIS — K5900 Constipation, unspecified: Secondary | ICD-10-CM

## 2015-01-06 DIAGNOSIS — K219 Gastro-esophageal reflux disease without esophagitis: Secondary | ICD-10-CM

## 2015-01-06 MED ORDER — BISACODYL 10 MG RE SUPP: RECTAL | Status: DC

## 2015-01-06 MED ORDER — BENEFIBER DRINK MIX PO PACK
4.0000 g | PACK | Freq: Every day | ORAL | Status: DC
Start: 2015-01-06 — End: 2017-10-11

## 2015-01-06 NOTE — Progress Notes (Signed)
Presenting complaint;  Follow-up for chronic GERD. Patient having difficulty with bowels movements.  Subjective:  Patient is 81 year old Caucasian female who is here for scheduled visit accompanied by her daughter [name]. She was last seen 6 months ago. She has chronic GERD and large sliding hiatal hernia. She had EGD in December 2014 when she was admitted with nausea and vomiting and found to have large sliding hiatal hernia. Patient has dementia and is cared for at [facility]. Patient is not having any problems with nausea vomiting or dysphagia. However she's been having problems with constipation. She developed fecal impaction and was seen in emergency room about 9 weeks ago. She was treated and was able to go home. She's been maintained on polyethylene glycol and still having issues. She requires some help last week Thursday. According to her daughter she has good appetite she has intermittent burping. She has some bed and wheelchair bound. Patient denies abdominal pain.   Current Medications: Outpatient Encounter Prescriptions as of 01/06/2015  Medication Sig  . acetaminophen (TYLENOL) 500 MG tablet Take 1,000 mg by mouth every 6 (six) hours as needed for mild pain or moderate pain.  Marland Kitchen ARTIFICIAL TEAR SOLUTION OP Apply 1 drop to eye. 1Drop in each eye three times daily  . ASPIRIN LOW DOSE 81 MG EC tablet TAKE 1 TABLET BY MOUTH ONCE DAILY.  . B Complex-C (B-COMPLEX WITH VITAMIN C) tablet TAKE 1 TABLET BY MOUTH ONCE DAILY.  . BENICAR 20 MG tablet TAKE 1 TABLET BY MOUTH ONCE DAILY.  . citalopram (CELEXA) 20 MG tablet TAKE 1 TABLET BY MOUTH ONCE DAILY.(FOR DEPRESSION)  . Difluprednate (DUREZOL) 0.05 % EMUL Place 1 drop into the right eye at bedtime.   . donepezil (ARICEPT) 5 MG tablet TAKE 1 TABLET BY MOUTH EACH MORNING.  Marland Kitchen guaifenesin (ROBITUSSIN) 100 MG/5ML syrup Take 200 mg by mouth every 6 (six) hours as needed for cough or congestion.  Marland Kitchen loperamide (IMODIUM) 2 MG capsule Take 2 mg  by mouth 2 (two) times daily as needed for diarrhea or loose stools.   Marland Kitchen loratadine (CLARITIN) 10 MG tablet TAKE 1 TABLET BY MOUTH ONCE DAILY.(FOR ALLERGIC RHINITIS)  . memantine (NAMENDA) 10 MG tablet TAKE 1 TABLET BY MOUTH TWICE DAILY.  Marland Kitchen omeprazole (PRILOSEC) 20 MG capsule TAKE 1 CAPSULE BY MOUTH TWICE DAILY.  Marland Kitchen ondansetron (ZOFRAN-ODT) 4 MG disintegrating tablet DISSOLVE 1 TABLET IN MOOUTH EVERY 6 HOURS AS NEEDED FOR NAUSEA.  . polyethylene glycol powder (GLYCOLAX/MIRALAX) powder MIX 1 CAPFUL (17G) IN 8 OUNCES OF JUICE/WATER AND DRINK ONCE DAILY.  Marland Kitchen primidone (MYSOLINE) 50 MG tablet TAKE 1/2 TABLET (25mg ) BY MOUTH AT BEDTIME.  . traMADol (ULTRAM) 50 MG tablet Take 1 tablet (50 mg total) by mouth every 6 (six) hours as needed for moderate pain.  . [DISCONTINUED] acetaminophen (TYLENOL) 325 MG tablet Take 650 mg by mouth every 6 (six) hours as needed for mild pain or fever.  . [DISCONTINUED] ENSURE PLUS (ENSURE PLUS) LIQD Take 237 mLs by mouth 4 (four) times daily -  with meals and at bedtime.  . [DISCONTINUED] hydroxypropyl methylcellulose (ISOPTO TEARS) 2.5 % ophthalmic solution Place 1 drop into both eyes 3 (three) times daily as needed for dry eyes.  . [DISCONTINUED] magnesium citrate SOLN Take 296 mLs (1 Bottle total) by mouth once. (Patient not taking: Reported on 01/06/2015)  . [DISCONTINUED] mineral oil enema Place 133 mLs (1 enema total) rectally once. (Patient not taking: Reported on 01/06/2015)  . [DISCONTINUED] polyethylene glycol (COLYTE) 240 G  solution Take 4,000 mLs by mouth once. (Patient not taking: Reported on 01/06/2015)  . [DISCONTINUED] Q-PAP 500 MG tablet TAKE 1 TABLET BY MOUTH TWICE DAILY. (Patient not taking: Reported on 10/26/2014)     Objective: Blood pressure 122/72, pulse 74, temperature 98.2 F (36.8 C), temperature source Oral, resp. rate 18, height 5' (1.524 m), weight 167 lb (75.751 kg). Patient is alert and responds to simple questions. Conjunctiva is pink. Sclera  is nonicteric Oropharyngeal mucosa is normal. No neck masses or thyromegaly noted. Cardiac exam with regular rhythm normal S1 and S2. No murmur or gallop noted. Lungs are clear to auscultation. Abdomen is full but soft and nontender without organomegaly or masses.  No LE edema or clubbing noted.  Labs/studies Results: Abdominopelvic CT from 10/29/2014 reviewed revealing distended rectum full of stool.   Assessment:  #1. Chronic GERD. She has large sliding hiatal hernia. She is at risk for gastroesophageal reflux disease because of her limited mobility. She appears to be doing well with double dose PPI. #2. Constipation. She will benefit from addition of fiber supplement.   Plan:  Continue omeprazole at 20 mg by mouth twice a day. Continue polyethylene glycol 17 g by mouth daily at bedtime. Benefiber 4 g by mouth daily at bedtime. Dulcolax suppository every other day as needed. Office visit in 6 months.

## 2015-01-14 ENCOUNTER — Other Ambulatory Visit: Payer: Self-pay | Admitting: Family Medicine

## 2015-01-20 ENCOUNTER — Other Ambulatory Visit: Payer: Self-pay | Admitting: Family Medicine

## 2015-01-26 ENCOUNTER — Other Ambulatory Visit: Payer: Self-pay | Admitting: Family Medicine

## 2015-01-28 ENCOUNTER — Telehealth: Payer: Self-pay | Admitting: *Deleted

## 2015-01-28 ENCOUNTER — Other Ambulatory Visit: Payer: Self-pay | Admitting: *Deleted

## 2015-01-28 ENCOUNTER — Telehealth: Payer: Self-pay | Admitting: Family Medicine

## 2015-01-28 MED ORDER — KETOCONAZOLE 2 % EX CREA: TOPICAL_CREAM | CUTANEOUS | Status: DC

## 2015-01-28 NOTE — Telephone Encounter (Signed)
Send in Nizoral cream, 30g apply bid till gone, 4 refills

## 2015-01-28 NOTE — Telephone Encounter (Signed)
Med sent to [facility]

## 2015-01-28 NOTE — Telephone Encounter (Signed)
Alesia from [facility] called to get order for a rash under pt's left breast. She states it is yeast. [facility].

## 2015-01-28 NOTE — Telephone Encounter (Signed)
close

## 2015-01-30 ENCOUNTER — Ambulatory Visit (INDEPENDENT_AMBULATORY_CARE_PROVIDER_SITE_OTHER): Payer: Medicare Other | Admitting: Ophthalmology

## 2015-01-30 DIAGNOSIS — H43812 Vitreous degeneration, left eye: Secondary | ICD-10-CM

## 2015-01-30 DIAGNOSIS — H35032 Hypertensive retinopathy, left eye: Secondary | ICD-10-CM

## 2015-01-30 DIAGNOSIS — I10 Essential (primary) hypertension: Secondary | ICD-10-CM

## 2015-02-03 ENCOUNTER — Other Ambulatory Visit: Payer: Self-pay | Admitting: Family Medicine

## 2015-02-10 ENCOUNTER — Other Ambulatory Visit: Payer: Self-pay | Admitting: Family Medicine

## 2015-02-13 ENCOUNTER — Ambulatory Visit: Payer: Medicare Other | Admitting: Family Medicine

## 2015-02-17 ENCOUNTER — Other Ambulatory Visit: Payer: Self-pay | Admitting: Family Medicine

## 2015-02-27 ENCOUNTER — Ambulatory Visit: Payer: Medicare Other | Admitting: Family Medicine

## 2015-03-04 ENCOUNTER — Telehealth: Payer: Self-pay | Admitting: Family Medicine

## 2015-03-04 NOTE — Telephone Encounter (Signed)
Pt has been complaining to the nursing home her shoulder has been  Bothering her for the three weeks on an off. They gave a pain pill today  To help ease her discomfort. (family not happy about that) They are calling today to ask what you would advise them to do? Does she need an xray? They are not aware she fell or has had any  Issues with her shoulder in her past. Has had a shot in her shoulder years  Ago but outside that they are not sure of any outside source for the pain

## 2015-03-04 NOTE — Telephone Encounter (Signed)
appt made for tomorrow to check shoulder.

## 2015-03-04 NOTE — Telephone Encounter (Signed)
Nurses, please call and talk with the family. We would be happy to see the patient if they would like. Another option would be if they find that when they try to move her arm if she complains of shoulder pain to let us know we will order a shoulder x-ray. Certainly if it is safe ongoing issue may need follow-up here may also need consultation with orthopedics. I am not opposed to ordering an x-ray if it seems that her shoulder is getting her pain

## 2015-03-05 ENCOUNTER — Ambulatory Visit (INDEPENDENT_AMBULATORY_CARE_PROVIDER_SITE_OTHER): Payer: Medicare Other | Admitting: Family Medicine

## 2015-03-05 ENCOUNTER — Ambulatory Visit (HOSPITAL_COMMUNITY)
Admission: RE | Admit: 2015-03-05 | Discharge: 2015-03-05 | Disposition: A | Discharge status: Home / Self Care | Payer: Medicare Other | Source: Ambulatory Visit | Attending: Family Medicine | Admitting: Family Medicine

## 2015-03-05 ENCOUNTER — Encounter: Payer: Self-pay | Admitting: Family Medicine

## 2015-03-05 VITALS — BP 122/78 | Ht 61.0 in | Wt 173.0 lb

## 2015-03-05 DIAGNOSIS — M25512 Pain in left shoulder: Secondary | ICD-10-CM

## 2015-03-05 NOTE — Progress Notes (Signed)
   Subjective:    Patient ID: [name] [name], female    DOB: 11/20/1924, 81 y.o.   MRN: [id number]  HPI  Patient arrives with complaint of left shoulder pain.  there is no known history of injury. Apparently over the past couple weeks when they try to get her address she complains of shoulder pain she does have dementia Review of Systems     No fever no cough Objective:   Physical Exam   shoulder pain discomfort left shoulder hard to really tell with her dementia but she seems to be in some discomfort 1 remove it there is tenderness as well right shoulder appears normal lungs clear heart regular clavicles normal      Assessment & Plan:   shoulder pain Tylenol 3 times a day x-rays indicated regular follow-up visits recommended no need for physical therapy I don't think patient would bear with it

## 2015-03-22 ENCOUNTER — Emergency Department (HOSPITAL_COMMUNITY): Payer: Medicare Other

## 2015-03-22 ENCOUNTER — Inpatient Hospital Stay (HOSPITAL_COMMUNITY)
Admission: EM | Admit: 2015-03-22 | Discharge: 2015-03-28 | DRG: 640 | Disposition: A | Discharge status: Skilled Nursing Facility | Payer: Medicare Other | Attending: Internal Medicine | Admitting: Internal Medicine

## 2015-03-22 ENCOUNTER — Encounter (HOSPITAL_COMMUNITY): Payer: Self-pay | Admitting: Emergency Medicine

## 2015-03-22 DIAGNOSIS — E785 Hyperlipidemia, unspecified: Secondary | ICD-10-CM | POA: Diagnosis present

## 2015-03-22 DIAGNOSIS — S0990XA Unspecified injury of head, initial encounter: Secondary | ICD-10-CM

## 2015-03-22 DIAGNOSIS — R55 Syncope and collapse: Secondary | ICD-10-CM | POA: Diagnosis not present

## 2015-03-22 DIAGNOSIS — I129 Hypertensive chronic kidney disease with stage 1 through stage 4 chronic kidney disease, or unspecified chronic kidney disease: Secondary | ICD-10-CM | POA: Diagnosis present

## 2015-03-22 DIAGNOSIS — S0190XA Unspecified open wound of unspecified part of head, initial encounter: Secondary | ICD-10-CM | POA: Diagnosis not present

## 2015-03-22 DIAGNOSIS — N183 Chronic kidney disease, stage 3 unspecified: Secondary | ICD-10-CM

## 2015-03-22 DIAGNOSIS — E869 Volume depletion, unspecified: Principal | ICD-10-CM | POA: Diagnosis present

## 2015-03-22 DIAGNOSIS — I5033 Acute on chronic diastolic (congestive) heart failure: Secondary | ICD-10-CM | POA: Diagnosis not present

## 2015-03-22 DIAGNOSIS — W19XXXA Unspecified fall, initial encounter: Secondary | ICD-10-CM | POA: Diagnosis present

## 2015-03-22 DIAGNOSIS — Z993 Dependence on wheelchair: Secondary | ICD-10-CM

## 2015-03-22 DIAGNOSIS — Z8249 Family history of ischemic heart disease and other diseases of the circulatory system: Secondary | ICD-10-CM

## 2015-03-22 DIAGNOSIS — S0101XA Laceration without foreign body of scalp, initial encounter: Secondary | ICD-10-CM

## 2015-03-22 DIAGNOSIS — R079 Chest pain, unspecified: Secondary | ICD-10-CM

## 2015-03-22 DIAGNOSIS — R5081 Fever presenting with conditions classified elsewhere: Secondary | ICD-10-CM

## 2015-03-22 DIAGNOSIS — S32019A Unspecified fracture of first lumbar vertebra, initial encounter for closed fracture: Secondary | ICD-10-CM

## 2015-03-22 DIAGNOSIS — R0789 Other chest pain: Secondary | ICD-10-CM | POA: Diagnosis present

## 2015-03-22 DIAGNOSIS — S32591A Other specified fracture of right pubis, initial encounter for closed fracture: Secondary | ICD-10-CM | POA: Diagnosis present

## 2015-03-22 DIAGNOSIS — Z833 Family history of diabetes mellitus: Secondary | ICD-10-CM

## 2015-03-22 DIAGNOSIS — J9601 Acute respiratory failure with hypoxia: Secondary | ICD-10-CM | POA: Diagnosis not present

## 2015-03-22 DIAGNOSIS — K219 Gastro-esophageal reflux disease without esophagitis: Secondary | ICD-10-CM | POA: Diagnosis present

## 2015-03-22 DIAGNOSIS — G309 Alzheimer's disease, unspecified: Secondary | ICD-10-CM | POA: Diagnosis present

## 2015-03-22 DIAGNOSIS — A419 Sepsis, unspecified organism: Secondary | ICD-10-CM | POA: Diagnosis not present

## 2015-03-22 DIAGNOSIS — W1811XA Fall from or off toilet without subsequent striking against object, initial encounter: Secondary | ICD-10-CM | POA: Diagnosis present

## 2015-03-22 DIAGNOSIS — S161XXA Strain of muscle, fascia and tendon at neck level, initial encounter: Secondary | ICD-10-CM

## 2015-03-22 DIAGNOSIS — M1990 Unspecified osteoarthritis, unspecified site: Secondary | ICD-10-CM | POA: Diagnosis present

## 2015-03-22 DIAGNOSIS — S098XXA Other specified injuries of head, initial encounter: Secondary | ICD-10-CM | POA: Diagnosis not present

## 2015-03-22 DIAGNOSIS — I69351 Hemiplegia and hemiparesis following cerebral infarction affecting right dominant side: Secondary | ICD-10-CM

## 2015-03-22 DIAGNOSIS — S299XXA Unspecified injury of thorax, initial encounter: Secondary | ICD-10-CM | POA: Diagnosis not present

## 2015-03-22 DIAGNOSIS — D696 Thrombocytopenia, unspecified: Secondary | ICD-10-CM | POA: Diagnosis present

## 2015-03-22 DIAGNOSIS — K5900 Constipation, unspecified: Secondary | ICD-10-CM

## 2015-03-22 DIAGNOSIS — J690 Pneumonitis due to inhalation of food and vomit: Secondary | ICD-10-CM

## 2015-03-22 DIAGNOSIS — Y92121 Bathroom in nursing home as the place of occurrence of the external cause: Secondary | ICD-10-CM

## 2015-03-22 DIAGNOSIS — Z66 Do not resuscitate: Secondary | ICD-10-CM | POA: Diagnosis present

## 2015-03-22 DIAGNOSIS — R109 Unspecified abdominal pain: Secondary | ICD-10-CM

## 2015-03-22 DIAGNOSIS — Z96651 Presence of right artificial knee joint: Secondary | ICD-10-CM | POA: Diagnosis present

## 2015-03-22 DIAGNOSIS — S199XXA Unspecified injury of neck, initial encounter: Secondary | ICD-10-CM | POA: Diagnosis not present

## 2015-03-22 DIAGNOSIS — R062 Wheezing: Secondary | ICD-10-CM | POA: Diagnosis present

## 2015-03-22 DIAGNOSIS — D638 Anemia in other chronic diseases classified elsewhere: Secondary | ICD-10-CM | POA: Diagnosis present

## 2015-03-22 DIAGNOSIS — N179 Acute kidney failure, unspecified: Secondary | ICD-10-CM | POA: Diagnosis present

## 2015-03-22 DIAGNOSIS — IMO0002 Reserved for concepts with insufficient information to code with codable children: Secondary | ICD-10-CM

## 2015-03-22 DIAGNOSIS — G20 Parkinson's disease: Secondary | ICD-10-CM | POA: Diagnosis present

## 2015-03-22 DIAGNOSIS — I10 Essential (primary) hypertension: Secondary | ICD-10-CM | POA: Diagnosis present

## 2015-03-22 DIAGNOSIS — R32 Unspecified urinary incontinence: Secondary | ICD-10-CM | POA: Diagnosis present

## 2015-03-22 DIAGNOSIS — I2510 Atherosclerotic heart disease of native coronary artery without angina pectoris: Secondary | ICD-10-CM | POA: Diagnosis present

## 2015-03-22 DIAGNOSIS — F0280 Dementia in other diseases classified elsewhere without behavioral disturbance: Secondary | ICD-10-CM | POA: Diagnosis present

## 2015-03-22 DIAGNOSIS — D509 Iron deficiency anemia, unspecified: Secondary | ICD-10-CM | POA: Diagnosis present

## 2015-03-22 DIAGNOSIS — R296 Repeated falls: Secondary | ICD-10-CM | POA: Diagnosis present

## 2015-03-22 DIAGNOSIS — M810 Age-related osteoporosis without current pathological fracture: Secondary | ICD-10-CM | POA: Diagnosis present

## 2015-03-22 DIAGNOSIS — E119 Type 2 diabetes mellitus without complications: Secondary | ICD-10-CM | POA: Diagnosis present

## 2015-03-22 DIAGNOSIS — R52 Pain, unspecified: Secondary | ICD-10-CM

## 2015-03-22 DIAGNOSIS — S32599A Other specified fracture of unspecified pubis, initial encounter for closed fracture: Secondary | ICD-10-CM

## 2015-03-22 LAB — CBC WITH DIFFERENTIAL/PLATELET
Basophils Absolute: 0 10*3/uL (ref 0.0–0.1)
Basophils Relative: 0 % (ref 0–1)
EOS ABS: 0 10*3/uL (ref 0.0–0.7)
Eosinophils Relative: 0 % (ref 0–5)
HEMATOCRIT: 30.9 % — AB (ref 36.0–46.0)
HEMOGLOBIN: 10 g/dL — AB (ref 12.0–15.0)
LYMPHS ABS: 1.3 10*3/uL (ref 0.7–4.0)
Lymphocytes Relative: 8 % — ABNORMAL LOW (ref 12–46)
MCH: 33.8 pg (ref 26.0–34.0)
MCHC: 32.4 g/dL (ref 30.0–36.0)
MCV: 104.4 fL — ABNORMAL HIGH (ref 78.0–100.0)
MONO ABS: 1.1 10*3/uL — AB (ref 0.1–1.0)
MONOS PCT: 7 % (ref 3–12)
NEUTROS PCT: 85 % — AB (ref 43–77)
Neutro Abs: 13.4 10*3/uL — ABNORMAL HIGH (ref 1.7–7.7)
Platelets: 208 10*3/uL (ref 150–400)
RBC: 2.96 MIL/uL — ABNORMAL LOW (ref 3.87–5.11)
RDW: 13.3 % (ref 11.5–15.5)
WBC: 15.8 10*3/uL — ABNORMAL HIGH (ref 4.0–10.5)

## 2015-03-22 LAB — BASIC METABOLIC PANEL
ANION GAP: 7 (ref 5–15)
BUN: 31 mg/dL — ABNORMAL HIGH (ref 6–20)
CALCIUM: 8.6 mg/dL — AB (ref 8.9–10.3)
CO2: 25 mmol/L (ref 22–32)
Chloride: 105 mmol/L (ref 101–111)
Creatinine, Ser: 1.43 mg/dL — ABNORMAL HIGH (ref 0.44–1.00)
GFR calc Af Amer: 37 mL/min — ABNORMAL LOW (ref >60)
GFR calc non Af Amer: 32 mL/min — ABNORMAL LOW (ref >60)
Glucose, Bld: 185 mg/dL — ABNORMAL HIGH (ref 70–99)
POTASSIUM: 4.8 mmol/L (ref 3.5–5.1)
SODIUM: 137 mmol/L (ref 135–145)

## 2015-03-22 LAB — TROPONIN I: Troponin I: 0.03 ng/mL (ref <0.031)

## 2015-03-22 MED ORDER — SODIUM CHLORIDE 0.9 % IV SOLN
INTRAVENOUS | Status: AC
  Administered 2015-03-22: 1 mL via INTRAVENOUS

## 2015-03-22 MED ORDER — HYDROGEN PEROXIDE 3 % EX SOLN
CUTANEOUS | Status: AC
  Filled 2015-03-22: qty 473

## 2015-03-22 MED ORDER — LIDOCAINE-EPINEPHRINE (PF) 2 %-1:200000 IJ SOLN
20.0000 mL | Freq: Once | INTRAMUSCULAR | Status: AC
  Administered 2015-03-22: 20 mL via INTRADERMAL
  Filled 2015-03-22: qty 20

## 2015-03-22 MED ORDER — HYDROGEN PEROXIDE 3 % EX SOLN
CUTANEOUS | Status: AC
Start: 2015-03-22 — End: 2015-03-22
  Administered 2015-03-22: 17:00:00
  Filled 2015-03-22: qty 473

## 2015-03-22 MED ORDER — SODIUM CHLORIDE 0.9 % IV BOLUS (SEPSIS)
500.0000 mL | Freq: Once | INTRAVENOUS | Status: AC
  Administered 2015-03-22: 500 mL via INTRAVENOUS

## 2015-03-22 NOTE — Discharge Instructions (Signed)
Staples are to be removed in 5-7 days. Please follow-up with your primary Dr. for this.  Return to the emergency department for worsening headache or other new and concerning symptoms.   Cervical Sprain A cervical sprain is an injury in the neck in which the strong, fibrous tissues (ligaments) that connect your neck bones stretch or tear. Cervical sprains can range from mild to severe. Severe cervical sprains can cause the neck vertebrae to be unstable. This can lead to damage of the spinal cord and can result in serious nervous system problems. The amount of time it takes for a cervical sprain to get better depends on the cause and extent of the injury. Most cervical sprains heal in 1 to 3 weeks. CAUSES  Severe cervical sprains may be caused by:   Contact sport injuries (such as from football, rugby, wrestling, hockey, auto racing, gymnastics, diving, martial arts, or boxing).   Motor vehicle collisions.   Whiplash injuries. This is an injury from a sudden forward and backward whipping movement of the head and neck.  Falls.  Mild cervical sprains may be caused by:   Being in an awkward position, such as while cradling a telephone between your ear and shoulder.   Sitting in a chair that does not offer proper support.   Working at a poorly Landscape [employer] station.   Looking up or down for long periods of time.  SYMPTOMS   Pain, soreness, stiffness, or a burning sensation in the front, back, or sides of the neck. This discomfort may develop immediately after the injury or slowly, 24 hours or more after the injury.   Pain or tenderness directly in the middle of the back of the neck.   Shoulder or upper back pain.   Limited ability to move the neck.   Headache.   Dizziness.   Weakness, numbness, or tingling in the hands or arms.   Muscle spasms.   Difficulty swallowing or chewing.   Tenderness and swelling of the neck.  DIAGNOSIS  Most of the time your  health care provider can diagnose a cervical sprain by taking your history and doing a physical exam. Your health care provider will ask about previous neck injuries and any known neck problems, such as arthritis in the neck. X-rays may be taken to find out if there are any other problems, such as with the bones of the neck. Other tests, such as a CT scan or MRI, may also be needed.  TREATMENT  Treatment depends on the severity of the cervical sprain. Mild sprains can be treated with rest, keeping the neck in place (immobilization), and pain medicines. Severe cervical sprains are immediately immobilized. Further treatment is done to help with pain, muscle spasms, and other symptoms and may include:  Medicines, such as pain relievers, numbing medicines, or muscle relaxants.   Physical therapy. This may involve stretching exercises, strengthening exercises, and posture training. Exercises and improved posture can help stabilize the neck, strengthen muscles, and help stop symptoms from returning.  HOME CARE INSTRUCTIONS   Put ice on the injured area.   Put ice in a plastic bag.   Place a towel between your skin and the bag.   Leave the ice on for 15-20 minutes, 3-4 times a day.   If your injury was severe, you may have been given a cervical collar to wear. A cervical collar is a two-piece collar designed to keep your neck from moving while it heals.  Do not remove the collar  unless instructed by your health care provider.  If you have long hair, keep it outside of the collar.  Ask your health care provider before making any adjustments to your collar. Minor adjustments may be required over time to improve comfort and reduce pressure on your chin or on the back of your head.  Ifyou are allowed to remove the collar for cleaning or bathing, follow your health care provider's instructions on how to do so safely.  Keep your collar clean by wiping it with mild soap and water and drying it  completely. If the collar you have been given includes removable pads, remove them every 1-2 days and hand wash them with soap and water. Allow them to air dry. They should be completely dry before you wear them in the collar.  If you are allowed to remove the collar for cleaning and bathing, wash and dry the skin of your neck. Check your skin for irritation or sores. If you see any, tell your health care provider.  Do not drive while wearing the collar.   Only take over-the-counter or prescription medicines for pain, discomfort, or fever as directed by your health care provider.   Keep all follow-up appointments as directed by your health care provider.   Keep all physical therapy appointments as directed by your health care provider.   Make any needed adjustments to your workstation to promote good posture.   Avoid positions and activities that make your symptoms worse.   Warm up and stretch before being active to help prevent problems.  SEEK MEDICAL CARE IF:   Your pain is not controlled with medicine.   You are unable to decrease your pain medicine over time as planned.   Your activity level is not improving as expected.  SEEK IMMEDIATE MEDICAL CARE IF:   You develop any bleeding.  You develop stomach upset.  You have signs of an allergic reaction to your medicine.   Your symptoms get worse.   You develop new, unexplained symptoms.   You have numbness, tingling, weakness, or paralysis in any part of your body.  MAKE SURE YOU:   Understand these instructions.  Will watch your condition.  Will get help right away if you are not doing well or get worse. Document Released: 09/04/2007 Document Revised: 11/12/2013 Document Reviewed: 05/15/2013 [facility] Patient Information 2015 [place], [place]. This information is not intended to replace advice given to you by your health care provider. Make sure you discuss any questions you have with your health care  provider.  Head Injury You have received a head injury. It does not appear serious at this time. Headaches and vomiting are common following head injury. It should be easy to awaken from sleeping. Sometimes it is necessary for you to stay in the emergency department for a while for observation. Sometimes admission to the hospital may be needed. After injuries such as yours, most problems occur within the first 24 hours, but side effects may occur up to 7-10 days after the injury. It is important for you to carefully monitor your condition and contact your health care provider or seek immediate medical care if there is a change in your condition. WHAT ARE THE TYPES OF HEAD INJURIES? Head injuries can be as minor as a bump. Some head injuries can be more severe. More severe head injuries include:  A jarring injury to the brain (concussion).  A bruise of the brain (contusion). This mean there is bleeding in the brain that can cause  swelling.  A cracked skull (skull fracture).  Bleeding in the brain that collects, clots, and forms a bump (hematoma). WHAT CAUSES A HEAD INJURY? A serious head injury is most likely to happen to someone who is in a car wreck and is not wearing a seat belt. Other causes of major head injuries include bicycle or motorcycle accidents, sports injuries, and falls. HOW ARE HEAD INJURIES DIAGNOSED? A complete history of the event leading to the injury and your current symptoms will be helpful in diagnosing head injuries. Many times, pictures of the brain, such as CT or MRI are needed to see the extent of the injury. Often, an overnight hospital stay is necessary for observation.  WHEN SHOULD I SEEK IMMEDIATE MEDICAL CARE?  You should get help right away if:  You have confusion or drowsiness.  You feel sick to your stomach (nauseous) or have continued, forceful vomiting.  You have dizziness or unsteadiness that is getting worse.  You have severe, continued headaches not  relieved by medicine. Only take over-the-counter or prescription medicines for pain, fever, or discomfort as directed by your health care provider.  You do not have normal function of the arms or legs or are unable to walk.  You notice changes in the black spots in the center of the colored part of your eye (pupil).  You have a clear or bloody fluid coming from your nose or ears.  You have a loss of vision. During the next 24 hours after the injury, you must stay with someone who can watch you for the warning signs. This person should contact local emergency services (911 in the [place] if you have seizures, you become unconscious, or you are unable to wake up. HOW CAN I PREVENT A HEAD INJURY IN THE FUTURE? The most important factor for preventing major head injuries is avoiding motor vehicle accidents. To minimize the potential for damage to your head, it is crucial to wear seat belts while riding in motor vehicles. Wearing helmets while bike riding and playing collision sports (like football) is also helpful. Also, avoiding dangerous activities around the house will further help reduce your risk of head injury.  WHEN CAN I RETURN TO NORMAL ACTIVITIES AND ATHLETICS? You should be reevaluated by your health care provider before returning to these activities. If you have any of the following symptoms, you should not return to activities or contact sports until 1 week after the symptoms have stopped:  Persistent headache.  Dizziness or vertigo.  Poor attention and concentration.  Confusion.  Memory problems.  Nausea or vomiting.  Fatigue or tire easily.  Irritability.  Intolerant of bright lights or loud noises.  Anxiety or depression.  Disturbed sleep. MAKE SURE YOU:   Understand these instructions.  Will watch your condition.  Will get help right away if you are not doing well or get worse. Document Released: 11/07/2005 Document Revised: 11/12/2013 Document Reviewed:  07/15/2013 [facility] Patient Information 2015 [place], [place]. This information is not intended to replace advice given to you by your health care provider. Make sure you discuss any questions you have with your health care provider.

## 2015-03-22 NOTE — ED Notes (Signed)
1926-this nurse was getting patient up from bed into wheelchair for granddaughter to take back to [facility]. When standing patient up, patient became limp, diaphoretic, pale, lost bowel and bladder and was not responsive to voice. Patient was placed back in bed, EDP to bedside and Vital signs taken. Initial pulse was 30 palpated. Patient placed on cardiac monitor. BP 112/39. Patient placed on 3 l/min [place] O2. Family member contacted and arrived back at bedside. Orders placed by EDP.

## 2015-03-22 NOTE — ED Provider Notes (Signed)
CSN: [id number]     Arrival date & time 03/22/15  1617 History   First MD Initiated Contact with Patient 03/22/15 1626     Chief Complaint  Patient presents with  . Fall     (Consider location/radiation/quality/duration/timing/severity/associated sxs/prior Treatment) HPI Comments: Patient is an 81 year old female resident of an extended care facility. She is brought for evaluation of a fall. She was apparently sitting on the toilet when she lost her balance and fell. She struck the right side of her head and has bleeding from a laceration. She has a history of dementia and adds no additional history.  Patient is a 81 y.o. female presenting with fall. The history is provided by the patient.  Fall This is a new problem. The current episode started less than 1 hour ago. The problem occurs constantly. The problem has not changed since onset.Associated symptoms include chest pain. Nothing aggravates the symptoms. Nothing relieves the symptoms. She has tried nothing for the symptoms. The treatment provided no relief.    Past Medical History  Diagnosis Date  . ASCVD (arteriosclerotic cardiovascular disease) 2003    Single vessel disease-60% D1  . Hypertension   . Hyperlipidemia   . Leukopenia     and anemia-hematology evaluation was nodiagnostic  . Degenerative joint disease     right TKA in 4/06  . Upper GI bleed     gastric stress ulcer in 2003  . Retinal artery branch occlusion of right eye     Sudden onset of right eye blindness-embolism suspected  . Gastroesophageal reflux disease with hiatal hernia   . Osteoporosis   . Lung nodule     Left lower lobe  . Parkinson's disease     PRESUMED BECAUSE SHE HAS A LOT OF SHAKING - BUT HAS SEEN NEUROLOGIST  AND WAS PUT ON MYSOLINE AND FAMILY HAS SEEN BIG IMPROVEMENT IN THE SHAKING  . Cervical spondylarthritis     S/p discectomy  . Diverticulosis   . Hiatal hernia   . Leukocytosis   . Hyperlipidemia   . Degenerative joint disease   . GI  bleed   . Cervical spondylarthritis   . Diverticulosis   . Shortness of breath     AND WHEEZING WITH ACTIVITY AND AT REST  . Coronary artery disease   . 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 Diabetes mellitus     No insulin- NO MEDS - LOST WEIGHT - NO LONGER HAS ELEVATED SUGARS  . History of nephrolithiasis   . Dementia     PT RESIDES AT [facility] - SHE TALKS SOME - DOES NOT AMBULATE- TRANSFERS TO W/C WITH 2 ASSISTANTS  . Alzheimer disease   . Senile dementia   . Alzheimer's disease    Past Surgical History  Procedure Laterality Date  . Total abdominal hysterectomy    . Cervical discectomy      Dr. Sherwood Gambler  . Cholecystectomy    . Total knee arthroplasty  2006    Right; Dr. Percell Miller  . Colonoscopy  2009  . Joint replacement    . Cystoscopy w/ ureteral stent placement  09/23/2011    Procedure: CYSTOSCOPY WITH RETROGRADE PYELOGRAM/URETERAL STENT PLACEMENT;  Surgeon: Marissa Nestle;  Location: [facility];  Service: Urology;  Laterality: Right;  . Esophagogastroduodenoscopy N/A 10/31/2013    Procedure: ESOPHAGOGASTRODUODENOSCOPY (EGD);  Surgeon: Rogene Houston, MD;  Location: [facility];  Service: Endoscopy;  Laterality: N/A;  . Vascular surgery      LASER OF RT EYE AFTER EYE STROKE  . Cystoscopy/retrograde/ureteroscopy Left 03/25/2014    Procedure: CYSTOSCOPY WITH  LEFT URETEROSCOPY AND LEFT RETROGRADE  STENT PLACEMENT;  Surgeon: Irine Seal, MD;  Location: [facility];  Service: Urology;  Laterality: Left;  . Holmium laser application Left 12/31/7987    Procedure: HOLMIUM LASER APPLICATION;  Surgeon: Irine Seal, MD;  Location: [facility];  Service: Urology;  Laterality: Left;  . Cystoscopy/retrograde/ureteroscopy/stone extraction with basket Left 04/10/2014    Procedure: LEFT SECOND STAGE URETEORSCOPY STONE EXTRACTION;  Surgeon: Malka So, MD;  Location: [facility];  Service: Urology;  Laterality: Left;  With STENT  .  Holmium laser application  01/02/9416    Procedure: HOLMIUM LASER APPLICATION;  Surgeon: Malka So, MD;  Location: [facility];  Service: Urology;;   Family History  Problem Relation Age of Onset  . Diabetes Sister   . Heart disease Sister   . Diabetes Brother    History  Substance Use Topics  . Smoking status: Never Smoker   . Smokeless tobacco: Never Used  . Alcohol Use: No   OB History    No data available     Review of Systems  Unable to perform ROS Cardiovascular: Positive for chest pain.      Allergies  Band-aid liquid bandage; Codeine; Dilaudid; Morphine; and Other  Home Medications   Prior to Admission medications   Medication Sig Start Date End Date Taking? Authorizing Provider  ARTIFICIAL TEAR SOLUTION OP Apply 1 drop to eye. 1Drop in each eye three times daily    Historical Provider, MD  ASPIRIN LOW DOSE 81 MG EC tablet TAKE 1 TABLET BY MOUTH ONCE DAILY. 12/25/14   Kathyrn Drown, MD  B Complex-C (B-COMPLEX WITH VITAMIN C) tablet TAKE 1 TABLET BY MOUTH ONCE DAILY. 11/12/14   Kathyrn Drown, MD  BENICAR 20 MG tablet TAKE 1 TABLET BY MOUTH ONCE DAILY. 01/26/15   Kathyrn Drown, MD  bisacodyl (DULCOLAX) 10 MG suppository Use every other day as needed for constipation 01/06/15   Rogene Houston, MD  citalopram (CELEXA) 20 MG tablet TAKE 1 TABLET BY MOUTH ONCE DAILY.(FOR DEPRESSION) 02/10/15   Kathyrn Drown, MD  Difluprednate (DUREZOL) 0.05 % EMUL Place 1 drop into the right eye at bedtime.     Historical Provider, MD  donepezil (ARICEPT) 5 MG tablet TAKE 1 TABLET BY MOUTH EACH MORNING. 02/17/15   Kathyrn Drown, MD  guaifenesin (ROBITUSSIN) 100 MG/5ML syrup Take 200 mg by mouth every 6 (six) hours as needed for cough or congestion.    Historical Provider, MD  ketoconazole (NIZORAL) 2 % cream Apply BID to rash under rash is gone 01/28/15   Kathyrn Drown, MD  loperamide (IMODIUM) 2 MG capsule Take 2 mg by mouth 2 (two) times daily as needed for diarrhea or loose stools.   09/12/14   Historical Provider, MD  loratadine (CLARITIN) 10 MG tablet TAKE 1 TABLET BY MOUTH ONCE DAILY.(FOR ALLERGIC RHINITIS) 02/03/15   Kathyrn Drown, MD  memantine (NAMENDA) 10 MG tablet TAKE 1 TABLET BY MOUTH TWICE DAILY. 12/15/14   Kathyrn Drown, MD  omeprazole (PRILOSEC) 20 MG capsule TAKE 1 CAPSULE BY MOUTH TWICE DAILY. 10/27/14   Rogene Houston, MD  ondansetron (ZOFRAN-ODT) 4 MG disintegrating tablet DISSOLVE 1 TABLET IN MOOUTH EVERY 6 HOURS AS NEEDED FOR NAUSEA. 12/30/14   Kathyrn Drown, MD  polyethylene glycol powder (  GLYCOLAX/MIRALAX) powder MIX 1 CAPFUL (17G) IN 8 OUNCES OF JUICE/WATER AND DRINK ONCE DAILY. 05/21/14   Kathyrn Drown, MD  primidone (MYSOLINE) 50 MG tablet TAKE 1/2 TABLET (25mg ) BY MOUTH AT BEDTIME. 01/14/15   Mikey Kirschner, MD  Q-PAP 500 MG tablet TAKE 1 TABLET BY MOUTH TWICE DAILY. 01/20/15   Kathyrn Drown, MD  traMADol (ULTRAM) 50 MG tablet Take 1 tablet (50 mg total) by mouth every 6 (six) hours as needed for moderate pain. 03/26/14   Irine Seal, MD  Wheat Dextrin (BENEFIBER DRINK MIX) PACK Take 4 g by mouth at bedtime. 01/06/15   Rogene Houston, MD   BP 160/60 mmHg  Pulse 68  Temp(Src) 97.4 F (36.3 C) (Oral)  Resp 18  Ht 5\' 4"  (1.626 m)  Wt 170 lb (77.111 kg)  BMI 29.17 kg/m2  SpO2 95% Physical Exam  Constitutional: She is oriented to person, place, and time. She appears well-developed and well-nourished. No distress.  HENT:  Head: Normocephalic and atraumatic.  There is bleeding from the right scalp.  Eyes: EOM are normal. Pupils are equal, round, and reactive to light.  Neck: Normal range of motion. Neck supple.  Cardiovascular: Normal rate and regular rhythm.  Exam reveals no gallop and no friction rub.   No murmur heard. Pulmonary/Chest: Effort normal and breath sounds normal. No respiratory distress. She has no wheezes.  Abdominal: Soft. Bowel sounds are normal. She exhibits no distension. There is no tenderness.  Musculoskeletal: Normal range of  motion.  She has good range of motion of both hips and knees without his comfort.  Neurological: She is alert and oriented to person, place, and time. No cranial nerve deficit.  Neurologic exam is difficult to carry out due to her dementia, however she moves all extremities and appears intact.  Skin: Skin is warm and dry. She is not diaphoretic.  Nursing note and vitals reviewed.   ED Course  Procedures (including critical care time) Labs Review Labs Reviewed - No data to display  Imaging Review No results found.   EKG Interpretation   Date/Time:  Sunday Mar 22 2015 19:46:35 EDT Ventricular Rate:  63 PR Interval:  252 QRS Duration: 139 QT Interval:  460 QTC Calculation: 471 R Axis:   -23 Text Interpretation:  Sinus rhythm Prolonged PR interval Left bundle  branch block Confirmed by Beau Fanny  MD, Kyran Whittier ([id number]) on 03/22/2015 8:14:33  PM     LACERATION REPAIR Performed by: Veryl Speak Authorized by: Veryl Speak Consent: Verbal consent obtained. Risks and benefits: risks, benefits and alternatives were discussed Consent given by: patient Patient identity confirmed: provided demographic data Prepped and Draped in normal sterile fashion Wound explored  Laceration Location: Right scalp  Laceration Length: 6 cm  No Foreign Bodies seen or palpated  Anesthesia: local infiltration  Local anesthetic: lidocaine 1 % with epinephrine  Anesthetic total: 5 ml  Irrigation method: syringe Amount of cleaning: standard  Skin closure: Staples   Number of sutures: 9   Technique: Staples   Patient tolerance: Patient tolerated the procedure well with no immediate complications.   MDM   Final diagnoses:  None    This patient is an 81 year old female with history of dementia is brought from a nursing home for evaluation of a fall. She was found to have a large laceration to the top of her head, however imaging of her head and neck shows no evidence for bleed or fracture.  The laceration was repaired and the patient  tolerated this well. My plan was initially to discharge her to the extended care facility.   When we attempted to stand her to get her into the wheelchair, she became syncopal and her pulses were not initially palpable. The nurses then immediately requested my assistance and I rushed to the patient's room. She appeared confused and was diaphoretic, however she did have an easily palpable carotid pulse by this time. She is placed on the cardiac monitor and EKG and laboratory studies were obtained. Her EKG showed a left bundle branch block and her hemoglobin is 10, which is not far from her baseline.  When I returned to the room to discuss these results with the family, the patient and family then informed me she was now experiencing chest discomfort. The troponin was obtained which was negative and a chest x-ray revealed no traumatic intrathoracic pathology.   Due to the patient's advanced age, history of head injury, syncope, and now chest discomfort, I feel as though admission for observation is warranted. I've spoken with Dr. Marin Comment who agrees to admit.  Veryl Speak, MD 03/22/15 [phone]

## 2015-03-22 NOTE — ED Notes (Signed)
At this time patient is responsive to voice and family member at bedside.

## 2015-03-22 NOTE — ED Notes (Signed)
Report given to [place] at [facility]. Family to transport pt back via POV.

## 2015-03-22 NOTE — ED Notes (Signed)
PT from ALF and reported that pt fell off of the toliet having a BM and hit the right side of her head on a sharp side of the counter. PT presents with alzheimers but c/o headache and bleeding from right head laceration.

## 2015-03-23 ENCOUNTER — Observation Stay (HOSPITAL_COMMUNITY): Payer: Medicare Other

## 2015-03-23 ENCOUNTER — Encounter (HOSPITAL_COMMUNITY): Payer: Self-pay | Admitting: *Deleted

## 2015-03-23 DIAGNOSIS — S0101XD Laceration without foreign body of scalp, subsequent encounter: Secondary | ICD-10-CM | POA: Diagnosis not present

## 2015-03-23 DIAGNOSIS — Z0389 Encounter for observation for other suspected diseases and conditions ruled out: Secondary | ICD-10-CM | POA: Diagnosis not present

## 2015-03-23 DIAGNOSIS — S0990XA Unspecified injury of head, initial encounter: Secondary | ICD-10-CM

## 2015-03-23 DIAGNOSIS — R0789 Other chest pain: Secondary | ICD-10-CM

## 2015-03-23 DIAGNOSIS — R55 Syncope and collapse: Secondary | ICD-10-CM | POA: Diagnosis not present

## 2015-03-23 DIAGNOSIS — I10 Essential (primary) hypertension: Secondary | ICD-10-CM

## 2015-03-23 DIAGNOSIS — S0101XA Laceration without foreign body of scalp, initial encounter: Secondary | ICD-10-CM | POA: Insufficient documentation

## 2015-03-23 DIAGNOSIS — W19XXXA Unspecified fall, initial encounter: Secondary | ICD-10-CM | POA: Diagnosis not present

## 2015-03-23 LAB — BASIC METABOLIC PANEL
Anion gap: 7 (ref 5–15)
BUN: 25 mg/dL — AB (ref 6–20)
CHLORIDE: 108 mmol/L (ref 101–111)
CO2: 24 mmol/L (ref 22–32)
CREATININE: 1.09 mg/dL — AB (ref 0.44–1.00)
Calcium: 8.6 mg/dL — ABNORMAL LOW (ref 8.9–10.3)
GFR calc Af Amer: 51 mL/min — ABNORMAL LOW (ref >60)
GFR calc non Af Amer: 44 mL/min — ABNORMAL LOW (ref >60)
Glucose, Bld: 128 mg/dL — ABNORMAL HIGH (ref 70–99)
Potassium: 4.9 mmol/L (ref 3.5–5.1)
Sodium: 139 mmol/L (ref 135–145)

## 2015-03-23 LAB — URINALYSIS, ROUTINE W REFLEX MICROSCOPIC
Bilirubin Urine: NEGATIVE
Glucose, UA: NEGATIVE mg/dL
HGB URINE DIPSTICK: NEGATIVE
Ketones, ur: NEGATIVE mg/dL
Leukocytes, UA: NEGATIVE
NITRITE: NEGATIVE
PH: 6.5 (ref 5.0–8.0)
Protein, ur: NEGATIVE mg/dL
Specific Gravity, Urine: 1.02 (ref 1.005–1.030)
Urobilinogen, UA: 0.2 mg/dL (ref 0.0–1.0)

## 2015-03-23 LAB — CBC
HCT: 28.1 % — ABNORMAL LOW (ref 36.0–46.0)
HEMOGLOBIN: 9.2 g/dL — AB (ref 12.0–15.0)
MCH: 33.9 pg (ref 26.0–34.0)
MCHC: 32.7 g/dL (ref 30.0–36.0)
MCV: 103.7 fL — ABNORMAL HIGH (ref 78.0–100.0)
PLATELETS: 186 10*3/uL (ref 150–400)
RBC: 2.71 MIL/uL — ABNORMAL LOW (ref 3.87–5.11)
RDW: 13.3 % (ref 11.5–15.5)
WBC: 7.8 10*3/uL (ref 4.0–10.5)

## 2015-03-23 LAB — TROPONIN I
TROPONIN I: 0.03 ng/mL (ref <0.031)
TROPONIN I: 0.04 ng/mL — AB (ref <0.031)
Troponin I: 0.03 ng/mL (ref <0.031)

## 2015-03-23 LAB — VITAMIN B12: Vitamin B-12: 972 pg/mL — ABNORMAL HIGH (ref 180–914)

## 2015-03-23 MED ORDER — PRIMIDONE 50 MG PO TABS
25.0000 mg | ORAL_TABLET | Freq: Every day | ORAL | Status: DC
  Administered 2015-03-23 – 2015-03-27 (×5): 25 mg via ORAL
  Filled 2015-03-23 (×7): qty 0.5

## 2015-03-23 MED ORDER — DONEPEZIL HCL 5 MG PO TABS
5.0000 mg | ORAL_TABLET | Freq: Every day | ORAL | Status: DC
  Administered 2015-03-23 – 2015-03-27 (×5): 5 mg via ORAL
  Filled 2015-03-23 (×5): qty 1

## 2015-03-23 MED ORDER — CITALOPRAM HYDROBROMIDE 20 MG PO TABS
20.0000 mg | ORAL_TABLET | Freq: Every day | ORAL | Status: DC
  Administered 2015-03-23 – 2015-03-28 (×6): 20 mg via ORAL
  Filled 2015-03-23 (×6): qty 1

## 2015-03-23 MED ORDER — MEMANTINE HCL 10 MG PO TABS
10.0000 mg | ORAL_TABLET | Freq: Two times a day (BID) | ORAL | Status: DC
  Administered 2015-03-23 – 2015-03-28 (×12): 10 mg via ORAL
  Filled 2015-03-23 (×12): qty 1

## 2015-03-23 MED ORDER — SODIUM CHLORIDE 0.9 % IJ SOLN
3.0000 mL | Freq: Two times a day (BID) | INTRAMUSCULAR | Status: DC
  Administered 2015-03-23 – 2015-03-28 (×11): 3 mL via INTRAVENOUS

## 2015-03-23 MED ORDER — PSYLLIUM 95 % PO PACK
1.0000 | PACK | Freq: Every day | ORAL | Status: DC
  Administered 2015-03-23 – 2015-03-27 (×4): 1 via ORAL
  Filled 2015-03-23 (×7): qty 1

## 2015-03-23 MED ORDER — PANTOPRAZOLE SODIUM 40 MG PO TBEC
40.0000 mg | DELAYED_RELEASE_TABLET | Freq: Every day | ORAL | Status: DC
Start: 2015-03-23 — End: 2015-03-28
  Administered 2015-03-23 – 2015-03-28 (×6): 40 mg via ORAL
  Filled 2015-03-23 (×6): qty 1

## 2015-03-23 MED ORDER — POLYVINYL ALCOHOL 1.4 % OP SOLN
1.0000 [drp] | Freq: Three times a day (TID) | OPHTHALMIC | Status: DC
  Administered 2015-03-23 – 2015-03-28 (×16): 1 [drp] via OPHTHALMIC
  Filled 2015-03-23 (×3): qty 15

## 2015-03-23 MED ORDER — SODIUM CHLORIDE 0.9 % IV SOLN
INTRAVENOUS | Status: DC
  Administered 2015-03-23: 14:00:00 via INTRAVENOUS

## 2015-03-23 MED ORDER — IRBESARTAN 150 MG PO TABS: 150.0000 mg | ORAL_TABLET | Freq: Every day | ORAL | Status: DC

## 2015-03-23 MED ORDER — DIFLUPREDNATE 0.05 % OP EMUL: 1.0000 [drp] | Freq: Every day | OPHTHALMIC | Status: DC

## 2015-03-23 MED ORDER — ASPIRIN EC 81 MG PO TBEC
81.0000 mg | DELAYED_RELEASE_TABLET | Freq: Every day | ORAL | Status: DC
  Administered 2015-03-23 – 2015-03-28 (×6): 81 mg via ORAL
  Filled 2015-03-23 (×6): qty 1

## 2015-03-23 MED ORDER — ACETAMINOPHEN 325 MG PO TABS
650.0000 mg | ORAL_TABLET | Freq: Four times a day (QID) | ORAL | Status: DC | PRN
  Administered 2015-03-24 – 2015-03-25 (×4): 650 mg via ORAL
  Filled 2015-03-23 (×5): qty 2

## 2015-03-23 NOTE — H&P (Signed)
[facility] History and Physical  [name] [name] HWE:[id number] DOB: 08/01/1924    PCP:   [name] Lange, MD   Chief Complaint: fall.   HPI: [name] [name] is an 81 y.o. female with hx of advanced dementia, SNF resident with DNR code status, hx of HTN, HLD, Parkinson's Disease, found either fell or syncopized and had a large laceration on her head.  She has dementia and communicated very limited.  She had staples done, and was being discharged to her family, but had LOC when she was being stood up.  She regained consciousness, and was attempted to be discharged again, and then had complained of chest discomfort.  Her head CT was negative, and EKG, CXR and troponin were negative.  Given she was having trouble with her discharge, EDP asked hospitalist to admit her for observation.   Rewiew of Systems: Unable.   Past Medical History  Diagnosis Date  . ASCVD (arteriosclerotic cardiovascular disease) 2003    Single vessel disease-60% D1  . Hypertension   . Hyperlipidemia   . Leukopenia     and anemia-hematology evaluation was nodiagnostic  . Degenerative joint disease     right TKA in 4/06  . Upper GI bleed     gastric stress ulcer in 2003  . Retinal artery branch occlusion of right eye     Sudden onset of right eye blindness-embolism suspected  . Gastroesophageal reflux disease with hiatal hernia   . Osteoporosis   . Lung nodule     Left lower lobe  . Parkinson's disease     PRESUMED BECAUSE SHE HAS A LOT OF SHAKING - BUT HAS SEEN NEUROLOGIST  AND WAS PUT ON MYSOLINE AND FAMILY HAS SEEN BIG IMPROVEMENT IN THE SHAKING  . Cervical spondylarthritis     S/p discectomy  . Diverticulosis   . Hiatal hernia   . Leukocytosis   . Hyperlipidemia   . Degenerative joint disease   . GI bleed   . Cervical spondylarthritis   . Diverticulosis   . Shortness of breath     AND WHEEZING WITH ACTIVITY AND AT REST  . Coronary artery disease   . Stroke     RIGHT EYE - IS BLIND IN RT EYE   . Diabetes mellitus     No insulin- NO MEDS - LOST WEIGHT - NO LONGER HAS ELEVATED SUGARS  . History of nephrolithiasis   . Dementia     PT RESIDES AT [facility] - SHE TALKS SOME - DOES NOT AMBULATE- TRANSFERS TO W/C WITH 2 ASSISTANTS  . Alzheimer disease   . Senile dementia   . Alzheimer's disease     Past Surgical History  Procedure Laterality Date  . Total abdominal hysterectomy    . Cervical discectomy      [name] [name]  . Cholecystectomy    . Total knee arthroplasty  2006    Right; [name] [name]  . Colonoscopy  2009  . Joint replacement    . Cystoscopy w/ ureteral stent placement  09/23/2011    Procedure: CYSTOSCOPY WITH RETROGRADE PYELOGRAM/URETERAL STENT PLACEMENT;  Surgeon: Marissa Nestle;  Location: [facility];  Service: Urology;  Laterality: Right;  . Esophagogastroduodenoscopy N/A 10/31/2013    Procedure: ESOPHAGOGASTRODUODENOSCOPY (EGD);  Surgeon: [name] Houston, MD;  Location: [facility];  Service: Endoscopy;  Laterality: N/A;  . Vascular surgery      LASER OF RT EYE AFTER EYE STROKE  . Cystoscopy/retrograde/ureteroscopy Left  03/25/2014    Procedure: CYSTOSCOPY WITH  LEFT URETEROSCOPY AND LEFT RETROGRADE  STENT PLACEMENT;  Surgeon: [name] Seal, MD;  Location: [facility];  Service: Urology;  Laterality: Left;  . Holmium laser application Left 12/25/2681    Procedure: HOLMIUM LASER APPLICATION;  Surgeon: [name] Seal, MD;  Location: [facility];  Service: Urology;  Laterality: Left;  . Cystoscopy/retrograde/ureteroscopy/stone extraction with basket Left 04/10/2014    Procedure: LEFT SECOND STAGE URETEORSCOPY STONE EXTRACTION;  Surgeon: [name] So, MD;  Location: [facility];  Service: Urology;  Laterality: Left;  With STENT  . Holmium laser application  03/09/6221    Procedure: HOLMIUM LASER APPLICATION;  Surgeon: [name] So, MD;  Location: [facility];  Service: Urology;;    Medications:  HOME MEDS: Prior to Admission  medications   Medication Sig Start Date End Date Taking? Authorizing Provider  ASPIRIN LOW DOSE 81 MG EC tablet TAKE 1 TABLET BY MOUTH ONCE DAILY. 12/25/14  Yes Kathyrn Drown, MD  B Complex-C (B-COMPLEX WITH VITAMIN C) tablet TAKE 1 TABLET BY MOUTH ONCE DAILY. 11/12/14  Yes Kathyrn Drown, MD  BENICAR 20 MG tablet TAKE 1 TABLET BY MOUTH ONCE DAILY. 01/26/15  Yes Kathyrn Drown, MD  bisacodyl (DULCOLAX) 10 MG suppository Use every other day as needed for constipation Patient taking differently: Place 10 mg rectally daily as needed for mild constipation. Use every other day as needed for constipation 01/06/15  Yes [name] Houston, MD  citalopram (CELEXA) 20 MG tablet TAKE 1 TABLET BY MOUTH ONCE DAILY.(FOR DEPRESSION) 02/10/15  Yes Kathyrn Drown, MD  donepezil (ARICEPT) 5 MG tablet TAKE 1 TABLET BY MOUTH EACH MORNING. 02/17/15  Yes Kathyrn Drown, MD  guaifenesin (ROBITUSSIN) 100 MG/5ML syrup Take 200 mg by mouth every 6 (six) hours as needed for cough or congestion.   Yes Historical Provider, MD  loratadine (CLARITIN) 10 MG tablet TAKE 1 TABLET BY MOUTH ONCE DAILY.(FOR ALLERGIC RHINITIS) 02/03/15  Yes Kathyrn Drown, MD  memantine (NAMENDA) 10 MG tablet TAKE 1 TABLET BY MOUTH TWICE DAILY. 12/15/14  Yes Kathyrn Drown, MD  omeprazole (PRILOSEC) 20 MG capsule TAKE 1 CAPSULE BY MOUTH TWICE DAILY. 10/27/14  Yes [name] Houston, MD  ondansetron (ZOFRAN-ODT) 4 MG disintegrating tablet DISSOLVE 1 TABLET IN MOOUTH EVERY 6 HOURS AS NEEDED FOR NAUSEA. 12/30/14  Yes [name] A Luking, MD  polyethylene glycol powder (GLYCOLAX/MIRALAX) powder MIX 1 CAPFUL (17G) IN 8 OUNCES OF JUICE/WATER AND DRINK ONCE DAILY. 05/21/14  Yes Kathyrn Drown, MD  polyvinyl alcohol (LIQUIFILM TEARS) 1.4 % ophthalmic solution Place 1 drop into both eyes 3 (three) times daily.   Yes Historical Provider, MD  primidone (MYSOLINE) 50 MG tablet TAKE 1/2 TABLET (25mg ) BY MOUTH AT BEDTIME. 01/14/15  Yes [name] Kirschner, MD  Q-PAP 500 MG tablet TAKE 1 TABLET  BY MOUTH TWICE DAILY. Patient taking differently: TAKE 1 TABLET BY MOUTH THREE TIMES DAILY 01/20/15  Yes Kathyrn Drown, MD  traMADol (ULTRAM) 50 MG tablet Take 1 tablet (50 mg total) by mouth every 6 (six) hours as needed for moderate pain. 03/26/14  Yes [name] Seal, MD  Wheat Dextrin (BENEFIBER DRINK MIX) PACK Take 4 g by mouth at bedtime. Patient taking differently: Take 4 g by mouth at bedtime. MIX ONE TEASPOONFUL IN 8 OUNCES WATER/JUICE 01/06/15  Yes [name] Houston, MD  Difluprednate (DUREZOL) 0.05 % EMUL Place 1 drop into the right eye at bedtime.     Historical Provider, MD  ketoconazole (NIZORAL) 2 % cream Apply BID to rash under rash is gone 01/28/15   Kathyrn Drown, MD  loperamide (IMODIUM) 2 MG capsule Take 2 mg by mouth 2 (two) times daily as needed for diarrhea or loose stools.  09/12/14   Historical Provider, MD     Allergies:  Allergies  Allergen Reactions  . Band-Aid Liquid Bandage [Dermagran]     Use non-stick gauze with paper tape  . Codeine Other (See Comments)    REACTION: Unknown  . Dilaudid [Hydromorphone Hcl]     Caused agitation, hallucinations, could not sleep, pt made funny noises like laughing / crying.  . Morphine Nausea And Vomiting  . Other     NO CITRUS, TOMATO BASED PRODUCTS OR CHOCOLATE - BECAUSE OF REFLUX PT'S FAMILY STATES THAT ANY TIME PT IS GIVEN NARCOTICS SHE HALLUCINATES AND TRIES TO GET UP OUT W/C OR BED Medications have to be crushed and put in apple sauce.    Social History:   reports that she has never smoked. She has never used smokeless tobacco. She reports that she does not drink alcohol or use illicit drugs.  Family History: Family History  Problem Relation Age of Onset  . Diabetes Sister   . Heart disease Sister   . Diabetes Brother      Physical Exam: Filed Vitals:   03/22/15 2200 03/22/15 2230 03/22/15 2300 03/22/15 2330  BP: 119/57 129/63 129/58 117/51  Pulse: 69  67 68  Temp:      TempSrc:      Resp: 20 19 16 15   Height:       Weight:      SpO2: 100%  94% 100%   Blood pressure 117/51, pulse 68, temperature 97.5 F (36.4 C), temperature source Oral, resp. rate 15, height 5\' 4"  (1.626 m), weight 77.111 kg (170 lb), SpO2 100 %.  GEN:  Pleasant  patient lying in the stretcher in no acute distress; minimally communicative.  PSYCH:   does not appear anxious or depressed; affect is appropriate. HEENT: Mucous membranes pink and anicteric; PERRLA; EOM intact; no cervical lymphadenopathy nor thyromegaly or carotid bruit; no JVD; There were no stridor. Neck is very supple. Breasts:: Not examined CHEST WALL: No tenderness CHEST: Normal respiration, clear to auscultation bilaterally.  HEART: Regular rate and rhythm.  There are no murmur, rub, or gallops.   BACK: No kyphosis or scoliosis; no CVA tenderness ABDOMEN: soft and non-tender; no masses, no organomegaly, normal abdominal bowel sounds; no pannus; no intertriginous candida. There is no rebound and no distention. Rectal Exam: Not done EXTREMITIES: No bone or joint deformity; age-appropriate arthropathy of the hands and knees; no edema; no ulcerations.  There is no calf tenderness. Genitalia: not examined PULSES: 2+ and symmetric SKIN: Normal hydration no rash or ulceration.  She has a dressing on her head.  CNS: Cranial nerves 2-12 grossly intact no focal lateralizing neurologic deficit.  Speech is fluent; uvula elevated with phonation, facial symmetry and tongue midline. DTR are normal bilaterally, cerebella exam is intact, barbinski is negative and strengths are equaled bilaterally.  No sensory loss.   Labs on Admission:  Basic Metabolic Panel:  Recent Labs Lab 03/22/15 2014  NA 137  K 4.8  CL 105  CO2 25  GLUCOSE 185*  BUN 31*  CREATININE 1.43*  CALCIUM 8.6*   CBC:  Recent Labs Lab 03/22/15 2014  WBC 15.8*  NEUTROABS 13.4*  HGB 10.0*  HCT 30.9*  MCV 104.4*  PLT 208  Cardiac Enzymes:  Recent Labs Lab 03/22/15 2014  TROPONINI 0.03     Radiological Exams on Admission: Dg Chest 2 View  03/22/2015   CLINICAL DATA:  [place] off toilet, hit head on counter. Became unresponsive in emergency department.  EXAM: CHEST  2 VIEW  COMPARISON:  Chest radiograph Mar 27, 2014 weeks and CT of the abdomen and pelvis October 26, 2014  FINDINGS: The cardiac silhouette is upper limits of normal in size, tortuous calcified aorta. No pleural effusion or focal consolidation. No pneumothorax. ACDF. Osteopenia. Soft tissue planes are nonsuspicious. Moderate hiatal hernia better seen on prior CT. L1 burst fracture was present previously.  IMPRESSION: Borderline cardiomegaly, no acute pulmonary process.   Electronically Signed   By: Elon Alas   On: 03/22/2015 22:51   Ct Head Wo Contrast  03/22/2015   CLINICAL DATA:  Status post fall.  Initial encounter.  EXAM: CT HEAD WITHOUT CONTRAST  CT CERVICAL SPINE WITHOUT CONTRAST  TECHNIQUE: Multidetector CT imaging of the head and cervical spine was performed following the standard protocol without intravenous contrast. Multiplanar CT image reconstructions of the cervical spine were also generated.  COMPARISON:  Head CT 06/10/2014.  Cervical spine CT 06/16/2013.  FINDINGS: CT HEAD FINDINGS  There is no evidence of acute intracranial hemorrhage, mass lesion, brain edema or extra-axial fluid collection. There is diffuse prominence of the ventricles and subarachnoid spaces consistent with moderate atrophy, similar to the prior examination. Extensive periventricular and subcortical white matter disease appears unchanged. There is no evidence of acute cortical infarct.  Soft tissue swelling is present in the right frontal scalp. There is no evidence of calvarial fracture. The visualized paranasal sinuses, mastoid air cells and middle ears are clear. 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  CT CERVICAL SPINE FINDINGS  The cervical alignment is stable status post C6-7 ACDF with an anterior plate and screws. Interbody fusion  appears solid. There is a convex left scoliosis with a degenerative grade 1 anterolisthesis at C5-6. There is no evidence acute fracture or traumatic subluxation. Multilevel degenerative changes are present with asymmetric right-sided facet disease. There is prominent synovial thickening surrounding the odontoid process. No acute soft tissue findings identified. Internal carotid artery calcifications noted.  IMPRESSION: 1. Right frontal scalp soft tissue injury. 2. No acute intracranial or calvarial findings. 3. No evidence of acute cervical spine fracture, traumatic subluxation or static signs of instability.   Electronically Signed   By: Richardean Sale M.D.   On: 03/22/2015 17:35   Ct Cervical Spine Wo Contrast  03/22/2015   CLINICAL DATA:  Status post fall.  Initial encounter.  EXAM: CT HEAD WITHOUT CONTRAST  CT CERVICAL SPINE WITHOUT CONTRAST  TECHNIQUE: Multidetector CT imaging of the head and cervical spine was performed following the standard protocol without intravenous contrast. Multiplanar CT image reconstructions of the cervical spine were also generated.  COMPARISON:  Head CT 06/10/2014.  Cervical spine CT 06/16/2013.  FINDINGS: CT HEAD FINDINGS  There is no evidence of acute intracranial hemorrhage, mass lesion, brain edema or extra-axial fluid collection. There is diffuse prominence of the ventricles and subarachnoid spaces consistent with moderate atrophy, similar to the prior examination. Extensive periventricular and subcortical white matter disease appears unchanged. There is no evidence of acute cortical infarct.  Soft tissue swelling is present in the right frontal scalp. There is no evidence of calvarial fracture. The visualized paranasal sinuses, mastoid air cells and middle ears are clear. 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  CT CERVICAL  SPINE FINDINGS  The cervical alignment is stable status post C6-7 ACDF with an anterior plate and screws. Interbody fusion appears solid. There is a  convex left scoliosis with a degenerative grade 1 anterolisthesis at C5-6. There is no evidence acute fracture or traumatic subluxation. Multilevel degenerative changes are present with asymmetric right-sided facet disease. There is prominent synovial thickening surrounding the odontoid process. No acute soft tissue findings identified. Internal carotid artery calcifications noted.  IMPRESSION: 1. Right frontal scalp soft tissue injury. 2. No acute intracranial or calvarial findings. 3. No evidence of acute cervical spine fracture, traumatic subluxation or static signs of instability.   Electronically Signed   By: Richardean Sale M.D.   On: 03/22/2015 17:35   Assessment/Plan Present on Admission:  . Fall . Syncope and collapse . PARKINSON'S DISEASE . Essential hypertension . Atypical chest pain  PLAN:  I discussed plan with her daughter and granddaughter.  We will admit her just for observation.  We will check an H and H in the am, and one troponin.  Otherwise, no further work up will be done, given her advanced age and advanced dementia.  Family agreed with this plan.  All questions answered.   If she is stable, and if H and H and troponin were OK, we planned to discharge her back to the SNF via ambulance tomorrow.  She doesn't ambulate, and requires assistance pivoting to her wheelchair.  Her meds will be continued.  She is a DNR, and this code status will remain.  Thank you for allowing me to participate in her care.   Other plans as per orders.  Code Status: DNR.    Orvan Falconer, MD. [facility] Pager [phone] 7pm to 7am.  03/23/2015, 12:10 AM

## 2015-03-23 NOTE — Progress Notes (Signed)
Pt's sister indicated that she does not want patient to have any pain medication. Please speak with family first.

## 2015-03-23 NOTE — Clinical Social Work Note (Signed)
Clinical Social Work Assessment  Patient Details  Name: [name] [name] MRN: [id number] Date of Birth: 04/20/1924  Date of referral:  03/23/15               Reason for consult:  Discharge Planning                Permission sought to share information with:    Permission granted to share information::     Name::        Agency::     Relationship::     Contact Information:     Housing/Transportation Living arrangements for the past 2 months:  [place] of Information:  Adult Children Patient Interpreter Needed:    Criminal Activity/Legal Involvement Pertinent to Current Situation/Hospitalization:  No - Comment as needed Significant Relationships:  Adult Children Lives with:  Facility Resident Do you feel safe going back to the place where you live?  Yes (Pt has done well at [facility] per family.) Need for family participation in patient care:  Yes (Comment) (Family is very involved and sees pt daily.)  Care giving concerns:  Pt is long term resident on memory care unit.   Social Worker assessment / plan:  CSW met with two of pt's daughters at bedside. Pt alert, but disoriented. They provided information regarding pt, but aware that CSW would contact [name] who is HCPOA. Daughters indicate that pt has been a resident at [facility] on memory care unit for about 4 years. Pt has 6 children who all live locally and someone visits pt at least once daily. Juliann Pulse is most often with pt. [name] has been out of town, but is returning this afternoon. Pt fell off toilet at facility and was sent by EMS to ED for evaluation. Admitted due to syncopal episode. Daughters report that pt has been doing well at [facility]. Pt was using a walker, but after several falls, is now using wheelchair. They plan to discuss what happened regarding fall with staff, but request return to facility as pt is adjusted there. [name] confirms above information. Per Olivia Mackie, administrator at  facility, pt requires very extensive assist with ADLs and okay to return.   Employment status:  Retired Forensic scientist:  Information systems manager, Medicaid In [place] PT Recommendations:  Not assessed at this time [place] / Referral to community resources:  Other (Comment Required) (return to [facility])  Patient/Family's Response to care:  Family plan to talk to staff at [facility] more about fall. Request return to facility.   Patient/Family's Understanding of and Emotional Response to Diagnosis, Current Treatment, and Prognosis:  Family appear to have understanding of dementia process. They are very involved and advocate for pt's needs.   Emotional Assessment Appearance:  Appears stated age Attitude/Demeanor/Rapport:  Unable to Assess Affect (typically observed):  Unable to Assess Orientation:    Alcohol / Substance use:  Not Applicable Psych involvement (Current and /or in the community):  No (Comment)  Discharge Needs  Concerns to be addressed:  Discharge Planning Concerns Readmission within the last 30 days:  No Current discharge risk:  Cognitively Impaired Barriers to Discharge:  Continued Medical Work up   [employer], [place] 03/23/2015, 1:19 PM [phone]

## 2015-03-23 NOTE — Care Management Note (Signed)
Case Management Note  Patient Details  Name: [name] [name] MRN: [id number] Date of Birth: [birth year]  Subjective/Objective:                  Pt admitted from [facility]. Pt requires maximal assistance with ADL's at facility.  Action/Plan: CSW is aware and will arrange discharge to facility at discharge. Facility stated that they are able to provide pt assistance with all ADL's.  Expected Discharge Date:  03/25/15               Expected Discharge Plan:  Assisted Living / Rest Home  In-House Referral:  Clinical Social Work  Discharge planning Services  CM Consult  Post Acute Care Choice:    Choice offered to:     DME Arranged:    DME Agency:     HH Arranged:    [place] Agency:     Status of Service:     Medicare Important Message Given:    Date Medicare IM Given:    Medicare IM give by:    Date Additional Medicare IM Given:    Additional Medicare Important Message give by:     If discussed at [place] of Stay Meetings, dates discussed:    Additional Comments:  Joylene Draft, RN 03/23/2015, 11:41 AM

## 2015-03-23 NOTE — Progress Notes (Signed)
Pt unable to stand. Per MD it is okay to perform orthostatic vitals lying and in the bed in the chair position.  B/P lying 98/39 and Pulse 113. B/P chair position 120/62, Pulse 114.

## 2015-03-23 NOTE — Progress Notes (Signed)
PT Cancellation Note  Patient Details Name: [name] [name] MRN: [id number] DOB: [birth year]   Cancelled Treatment:    Reason Eval/Treat Not Completed: PT screened, no needs identified, will sign off.  This pt requires total care in ACLF.  Currently, she screams whenever moved.  Her daughters state that this is from fear when in the hospital.  This is not an appropriate PT consult.  I have recommended that RN place pt's bed in a chair position in order to check orthostatics.   Demetrios Isaacs L 03/23/2015, 10:20 AM

## 2015-03-23 NOTE — Evaluation (Signed)
Physical Therapy Evaluation Patient Details Name: [name] [name] MRN: [id number] DOB: 05-Apr-1924 Today's Date: 03/23/2015   History of Present Illness  [name] is an 81 y.o. female with hx of advanced dementia, SNF resident with DNR code status, hx of HTN, HLD, Parkinson's Disease, found either fell or syncopized and had a large laceration on her head. She has dementia and communicated very limited. She had staples done, and was being discharged to her family, but had LOC when she was being stood up. She regained consciousness, and was attempted to be discharged again, and then had complained of chest discomfort. Her head CT was negative, and EKG, CXR and troponin were negative. Given she was having trouble with her discharge, EDP asked hospitalist to admit her for observation.   Clinical Impression  Pt has advance dementia and will not follow commands.  Pt was able to sit on edge of bed for three minutes without any alteration of consciousness.      Follow Up Recommendations No PT follow up    Equipment Recommendations    none   Recommendations for Other Services   none    Precautions / Restrictions Precautions Precautions: Fall Restrictions Weight Bearing Restrictions: No      Mobility  Bed Mobility Overal bed mobility: Needs Assistance Bed Mobility: Supine to Sit;Sit to Supine     Supine to sit: Max assist Sit to supine: Max assist      Transfers                 General transfer comment: Pt too anxious to transfer but daughter states that at dementia unit at assistive living pt would take 2 people with total assist to transfer.   Ambulation/Gait        Pt has not been ambulatory for years.                     Pertinent Vitals/Pain Pain Assessment: Faces Faces Pain Scale: Hurts little more Pain Intervention(s): Repositioned    Home Living Family/patient expects to be discharged to:: Assisted living               Home  Equipment: None      Prior Function Level of Independence: Needs assistance         Comments: Pt is a total assist for all activites secondary to dementia        Extremity/Trunk Assessment               Lower Extremity Assessment:  (unable to assess)         Communication   Communication:  (Demenitia)  Cognition Arousal/Alertness: Awake/alert Behavior During Therapy: Anxious Overall Cognitive Status: History of cognitive impairments - at baseline                               Assessment/Plan    PT Assessment Patent does not need any further PT services  PT Diagnosis     PT Problem List    PT Treatment Interventions      End of Session   Activity Tolerance: Treatment limited secondary to agitation Patient left: in bed;with call bell/phone within reach;with bed alarm set      Functional Limitation: Changing and maintaining body position Changing and Maintaining Body Position Current Status (B9390): 100 percent impaired, limited or restricted Changing and Maintaining Body Position Goal Status (Z0092): 100 percent impaired, limited or restricted Changing and  Maintaining Body Position Discharge Status [phone]): 100 percent impaired, limited or restricted    Time: 1510-1530 PT Time Calculation (min) (ACUTE ONLY): 20 min   Charges:   PT Evaluation $Initial PT Evaluation Tier I: 1 Procedure      PT G Codes:   PT G-Codes **NOT FOR INPATIENT CLASS** Functional Limitation: Changing and maintaining body position Changing and Maintaining Body Position Current Status (D6438): 100 percent impaired, limited or restricted Changing and Maintaining Body Position Goal Status (V8184): 100 percent impaired, limited or restricted Changing and Maintaining Body Position Discharge Status (C3754): 100 percent impaired, limited or restricted   Rayetta Humphrey, PT CLT [phone] 5/2/201, 3:34 PM

## 2015-03-23 NOTE — Progress Notes (Addendum)
PROGRESS NOTE  [name] [name] WNI:[id number] DOB: 06-03-1924 DOA: 03/22/2015 PCP: Sallee Lange, MD  Brief history 81 year old female with history of dementia, hypertension, hyperlipidemia presented to emergency department 1 she was found on the floor in the bathroom of her skilled nursing facility. Apparently, the patient was on the commode, and subsequently was found on the floor. It was unclear whether she had true syncope or had a mechanical fall. In emergency department, the patient had laceration of her frontal scalp. The patient was to be discharged back to the facility, but she had 2 syncopal episodes one of which she had bowel and bladder incontinence. As result, the patient was medically for further evaluation. At baseline, the patient is wheelchair bound. She requires at least 1 person max assist for transfers. Assessment/Plan: Syncope -Echocardiogram -EEG -Orthostatic vital signs -Gentle hydration Acute on chronic renal failure (CKD3) -Likely due to volume depletion -Improved with hydration -Baseline creatinine 1.0-1.2  -Hold ARB today and reevaluate in the next 24 hours Atypical chest pain -Likely due to the patient's mechanical fall -Cycle troponins -EKG sinus rhythm without any concerning ST-T wave change -Continue aspirin Leukocytosis -Likely stress demargination -UA and urine culture Dementia -Family states that the patient's mental status is back to baseline -Continue Namenda and Aricept Lower extremity pain and edema -Venous duplex r/o DVT Gait instability/fall -PT evaluation  Family Communication:   Daughter and granddaughter at beside--total time 56 min, >50% spent counseling and coordinating care  Disposition Plan:   SNF when medically stable       Procedures/Studies: Dg Chest 2 View  03/22/2015   CLINICAL DATA:  [address] off toilet, hit head on counter. Became unresponsive in emergency department.  EXAM: CHEST  2 VIEW  COMPARISON:  Chest  radiograph Mar 27, 2014 weeks and CT of the abdomen and pelvis October 26, 2014  FINDINGS: The cardiac silhouette is upper limits of normal in size, tortuous calcified aorta. No pleural effusion or focal consolidation. No pneumothorax. ACDF. Osteopenia. Soft tissue planes are nonsuspicious. Moderate hiatal hernia better seen on prior CT. L1 burst fracture was present previously.  IMPRESSION: Borderline cardiomegaly, no acute pulmonary process.   Electronically Signed   By: Elon Alas   On: 03/22/2015 22:51   Ct Head Wo Contrast  03/22/2015   CLINICAL DATA:  Status post fall.  Initial encounter.  EXAM: CT HEAD WITHOUT CONTRAST  CT CERVICAL SPINE WITHOUT CONTRAST  TECHNIQUE: Multidetector CT imaging of the head and cervical spine was performed following the standard protocol without intravenous contrast. Multiplanar CT image reconstructions of the cervical spine were also generated.  COMPARISON:  Head CT 06/10/2014.  Cervical spine CT 06/16/2013.  FINDINGS: CT HEAD FINDINGS  There is no evidence of acute intracranial hemorrhage, mass lesion, brain edema or extra-axial fluid collection. There is diffuse prominence of the ventricles and subarachnoid spaces consistent with moderate atrophy, similar to the prior examination. Extensive periventricular and subcortical white matter disease appears unchanged. There is no evidence of acute cortical infarct.  Soft tissue swelling is present in the right frontal scalp. There is no evidence of calvarial fracture. The visualized paranasal sinuses, mastoid air cells and middle ears are clear. 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  CT CERVICAL SPINE FINDINGS  The cervical alignment is stable status post C6-7 ACDF with an anterior plate and screws. Interbody fusion appears solid. There is a convex left scoliosis with a degenerative grade 1 anterolisthesis at C5-6. There is  no evidence acute fracture or traumatic subluxation. Multilevel degenerative changes are present with  asymmetric right-sided facet disease. There is prominent synovial thickening surrounding the odontoid process. No acute soft tissue findings identified. Internal carotid artery calcifications noted.  IMPRESSION: 1. Right frontal scalp soft tissue injury. 2. No acute intracranial or calvarial findings. 3. No evidence of acute cervical spine fracture, traumatic subluxation or static signs of instability.   Electronically Signed   By: Richardean Sale M.D.   On: 03/22/2015 17:35   Ct Cervical Spine Wo Contrast  03/22/2015   CLINICAL DATA:  Status post fall.  Initial encounter.  EXAM: CT HEAD WITHOUT CONTRAST  CT CERVICAL SPINE WITHOUT CONTRAST  TECHNIQUE: Multidetector CT imaging of the head and cervical spine was performed following the standard protocol without intravenous contrast. Multiplanar CT image reconstructions of the cervical spine were also generated.  COMPARISON:  Head CT 06/10/2014.  Cervical spine CT 06/16/2013.  FINDINGS: CT HEAD FINDINGS  There is no evidence of acute intracranial hemorrhage, mass lesion, brain edema or extra-axial fluid collection. There is diffuse prominence of the ventricles and subarachnoid spaces consistent with moderate atrophy, similar to the prior examination. Extensive periventricular and subcortical white matter disease appears unchanged. There is no evidence of acute cortical infarct.  Soft tissue swelling is present in the right frontal scalp. There is no evidence of calvarial fracture. The visualized paranasal sinuses, mastoid air cells and middle ears are clear. 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  CT CERVICAL SPINE FINDINGS  The cervical alignment is stable status post C6-7 ACDF with an anterior plate and screws. Interbody fusion appears solid. There is a convex left scoliosis with a degenerative grade 1 anterolisthesis at C5-6. There is no evidence acute fracture or traumatic subluxation. Multilevel degenerative changes are present with asymmetric right-sided  facet disease. There is prominent synovial thickening surrounding the odontoid process. No acute soft tissue findings identified. Internal carotid artery calcifications noted.  IMPRESSION: 1. Right frontal scalp soft tissue injury. 2. No acute intracranial or calvarial findings. 3. No evidence of acute cervical spine fracture, traumatic subluxation or static signs of instability.   Electronically Signed   By: Richardean Sale M.D.   On: 03/22/2015 17:35   Dg Shoulder Left  03/05/2015   CLINICAL DATA:  Left shoulder pain several days. No injury. Alzheimer's disease.  EXAM: LEFT SHOULDER - 2+ VIEW  COMPARISON:  Chest x-ray 03/27/2014  FINDINGS: There are mild degenerative changes of the [facility] joint greater than the glenohumeral joint. No fracture or dislocation.  IMPRESSION: No acute findings.  Mild degenerative changes.   Electronically Signed   By: Marin Olp M.D.   On: 03/05/2015 13:14         Subjective: Patient is pleasant and confused. No reports of vomiting, diarrhea, respiratory distress, uncontrolled pain.  Objective: Filed Vitals:   03/23/15 0130 03/23/15 0207 03/23/15 0227 03/23/15 0610  BP: 128/50  116/55 141/72  Pulse:   75 80  Temp:   97.8 F (36.6 C) 97.5 F (36.4 C)  TempSrc:   Oral Oral  Resp: 16  18 18   Height:  5\' 1"  (1.549 m) 5\' 1"  (1.549 m)   Weight:   77.111 kg (170 lb)   SpO2:   98% 98%    Intake/Output Summary (Last 24 hours) at 03/23/15 0841 Last data filed at 03/23/15 0700  Gross per 24 hour  Intake    875 ml  Output      0 ml  Net  875 ml   Weight change:  Exam:   General: Patient alert and awake. Does not follow commands. No distress.  HEENT: No icterus, No thrush,  [place]/AT  Cardiovascular: RRR, S1/S2, no rubs, no gallops  Respiratory: Reports poor effort but clear to auscultation  Abdomen: Soft/+BS, non tender, non distended, no guarding  Extremities: 1+LE edema, No lymphangitis, No petechiae, No rashes, no synovitis  Data Reviewed: Basic  Metabolic Panel:  Recent Labs Lab 03/22/15 2014 03/23/15 0659  NA 137 139  K 4.8 4.9  CL 105 108  CO2 25 24  GLUCOSE 185* 128*  BUN 31* 25*  CREATININE 1.43* 1.09*  CALCIUM 8.6* 8.6*   Liver Function Tests: No results for input(s): AST, ALT, ALKPHOS, BILITOT, PROT, ALBUMIN in the last 168 hours. No results for input(s): LIPASE, AMYLASE in the last 168 hours. No results for input(s): AMMONIA in the last 168 hours. CBC:  Recent Labs Lab 03/22/15 2014  WBC 15.8*  NEUTROABS 13.4*  HGB 10.0*  HCT 30.9*  MCV 104.4*  PLT 208   Cardiac Enzymes:  Recent Labs Lab 03/22/15 2014 03/23/15 0659  TROPONINI 0.03 0.03   BNP: Invalid input(s): POCBNP CBG: No results for input(s): GLUCAP in the last 168 hours.  No results found for this or any previous visit (from the past 240 hour(s)).   Scheduled Meds: . sodium chloride   Intravenous STAT  . aspirin EC  81 mg Oral Daily  . citalopram  20 mg Oral Daily  . Difluprednate  1 drop Right Eye QHS  . donepezil  5 mg Oral QHS  . irbesartan  150 mg Oral Daily  . memantine  10 mg Oral BID  . pantoprazole  40 mg Oral Daily  . polyvinyl alcohol  1 drop Both Eyes TID  . primidone  25 mg Oral QHS  . psyllium  1 packet Oral QHS  . sodium chloride  3 mL Intravenous Q12H   Continuous Infusions:    Quanasia Defino, DO  [facility] Pager [phone]  If 7PM-7AM, please contact night-coverage [email or web address] Password [facility] 03/23/2015, 8:41 AM

## 2015-03-24 ENCOUNTER — Observation Stay (HOSPITAL_COMMUNITY): Payer: Medicare Other

## 2015-03-24 DIAGNOSIS — N183 Chronic kidney disease, stage 3 unspecified: Secondary | ICD-10-CM

## 2015-03-24 DIAGNOSIS — S32511A Fracture of superior rim of right pubis, initial encounter for closed fracture: Secondary | ICD-10-CM | POA: Diagnosis not present

## 2015-03-24 DIAGNOSIS — M84350A Stress fracture, pelvis, initial encounter for fracture: Secondary | ICD-10-CM | POA: Diagnosis not present

## 2015-03-24 DIAGNOSIS — R55 Syncope and collapse: Secondary | ICD-10-CM | POA: Diagnosis not present

## 2015-03-24 DIAGNOSIS — Z833 Family history of diabetes mellitus: Secondary | ICD-10-CM | POA: Diagnosis not present

## 2015-03-24 DIAGNOSIS — N179 Acute kidney failure, unspecified: Secondary | ICD-10-CM | POA: Diagnosis not present

## 2015-03-24 DIAGNOSIS — F0280 Dementia in other diseases classified elsewhere without behavioral disturbance: Secondary | ICD-10-CM | POA: Diagnosis present

## 2015-03-24 DIAGNOSIS — S0990XD Unspecified injury of head, subsequent encounter: Secondary | ICD-10-CM | POA: Diagnosis not present

## 2015-03-24 DIAGNOSIS — Z7982 Long term (current) use of aspirin: Secondary | ICD-10-CM | POA: Diagnosis not present

## 2015-03-24 DIAGNOSIS — R32 Unspecified urinary incontinence: Secondary | ICD-10-CM | POA: Diagnosis present

## 2015-03-24 DIAGNOSIS — E785 Hyperlipidemia, unspecified: Secondary | ICD-10-CM | POA: Diagnosis present

## 2015-03-24 DIAGNOSIS — Z993 Dependence on wheelchair: Secondary | ICD-10-CM | POA: Diagnosis not present

## 2015-03-24 DIAGNOSIS — Z96651 Presence of right artificial knee joint: Secondary | ICD-10-CM | POA: Diagnosis present

## 2015-03-24 DIAGNOSIS — Z7401 Bed confinement status: Secondary | ICD-10-CM | POA: Diagnosis not present

## 2015-03-24 DIAGNOSIS — G309 Alzheimer's disease, unspecified: Secondary | ICD-10-CM | POA: Diagnosis present

## 2015-03-24 DIAGNOSIS — Z955 Presence of coronary angioplasty implant and graft: Secondary | ICD-10-CM | POA: Diagnosis not present

## 2015-03-24 DIAGNOSIS — I1310 Hypertensive heart and chronic kidney disease without heart failure, with stage 1 through stage 4 chronic kidney disease, or unspecified chronic kidney disease: Secondary | ICD-10-CM | POA: Diagnosis not present

## 2015-03-24 DIAGNOSIS — R296 Repeated falls: Secondary | ICD-10-CM | POA: Diagnosis not present

## 2015-03-24 DIAGNOSIS — S32509D Unspecified fracture of unspecified pubis, subsequent encounter for fracture with routine healing: Secondary | ICD-10-CM | POA: Diagnosis not present

## 2015-03-24 DIAGNOSIS — M810 Age-related osteoporosis without current pathological fracture: Secondary | ICD-10-CM | POA: Diagnosis not present

## 2015-03-24 DIAGNOSIS — I2510 Atherosclerotic heart disease of native coronary artery without angina pectoris: Secondary | ICD-10-CM | POA: Diagnosis present

## 2015-03-24 DIAGNOSIS — R609 Edema, unspecified: Secondary | ICD-10-CM | POA: Diagnosis not present

## 2015-03-24 DIAGNOSIS — Y92121 Bathroom in nursing home as the place of occurrence of the external cause: Secondary | ICD-10-CM | POA: Diagnosis not present

## 2015-03-24 DIAGNOSIS — R5081 Fever presenting with conditions classified elsewhere: Secondary | ICD-10-CM

## 2015-03-24 DIAGNOSIS — S0101XD Laceration without foreign body of scalp, subsequent encounter: Secondary | ICD-10-CM | POA: Diagnosis not present

## 2015-03-24 DIAGNOSIS — J690 Pneumonitis due to inhalation of food and vomit: Secondary | ICD-10-CM | POA: Diagnosis not present

## 2015-03-24 DIAGNOSIS — K5790 Diverticulosis of intestine, part unspecified, without perforation or abscess without bleeding: Secondary | ICD-10-CM | POA: Diagnosis not present

## 2015-03-24 DIAGNOSIS — K219 Gastro-esophageal reflux disease without esophagitis: Secondary | ICD-10-CM | POA: Diagnosis present

## 2015-03-24 DIAGNOSIS — Z66 Do not resuscitate: Secondary | ICD-10-CM | POA: Diagnosis present

## 2015-03-24 DIAGNOSIS — I69351 Hemiplegia and hemiparesis following cerebral infarction affecting right dominant side: Secondary | ICD-10-CM | POA: Diagnosis not present

## 2015-03-24 DIAGNOSIS — D509 Iron deficiency anemia, unspecified: Secondary | ICD-10-CM | POA: Diagnosis not present

## 2015-03-24 DIAGNOSIS — D638 Anemia in other chronic diseases classified elsewhere: Secondary | ICD-10-CM | POA: Diagnosis present

## 2015-03-24 DIAGNOSIS — K469 Unspecified abdominal hernia without obstruction or gangrene: Secondary | ICD-10-CM | POA: Diagnosis not present

## 2015-03-24 DIAGNOSIS — Z8673 Personal history of transient ischemic attack (TIA), and cerebral infarction without residual deficits: Secondary | ICD-10-CM | POA: Diagnosis not present

## 2015-03-24 DIAGNOSIS — I130 Hypertensive heart and chronic kidney disease with heart failure and stage 1 through stage 4 chronic kidney disease, or unspecified chronic kidney disease: Secondary | ICD-10-CM | POA: Diagnosis present

## 2015-03-24 DIAGNOSIS — G20 Parkinson's disease: Secondary | ICD-10-CM | POA: Diagnosis not present

## 2015-03-24 DIAGNOSIS — A419 Sepsis, unspecified organism: Secondary | ICD-10-CM | POA: Diagnosis not present

## 2015-03-24 DIAGNOSIS — I5033 Acute on chronic diastolic (congestive) heart failure: Secondary | ICD-10-CM | POA: Diagnosis not present

## 2015-03-24 DIAGNOSIS — R0789 Other chest pain: Secondary | ICD-10-CM | POA: Diagnosis not present

## 2015-03-24 DIAGNOSIS — F0390 Unspecified dementia without behavioral disturbance: Secondary | ICD-10-CM | POA: Diagnosis not present

## 2015-03-24 DIAGNOSIS — E119 Type 2 diabetes mellitus without complications: Secondary | ICD-10-CM | POA: Diagnosis not present

## 2015-03-24 DIAGNOSIS — W19XXXD Unspecified fall, subsequent encounter: Secondary | ICD-10-CM | POA: Diagnosis not present

## 2015-03-24 DIAGNOSIS — E869 Volume depletion, unspecified: Secondary | ICD-10-CM | POA: Diagnosis present

## 2015-03-24 DIAGNOSIS — W1811XA Fall from or off toilet without subsequent striking against object, initial encounter: Secondary | ICD-10-CM | POA: Diagnosis present

## 2015-03-24 DIAGNOSIS — Z9181 History of falling: Secondary | ICD-10-CM | POA: Diagnosis not present

## 2015-03-24 DIAGNOSIS — S0101XA Laceration without foreign body of scalp, initial encounter: Secondary | ICD-10-CM | POA: Diagnosis present

## 2015-03-24 DIAGNOSIS — R509 Fever, unspecified: Secondary | ICD-10-CM | POA: Diagnosis not present

## 2015-03-24 DIAGNOSIS — R279 Unspecified lack of coordination: Secondary | ICD-10-CM | POA: Diagnosis not present

## 2015-03-24 DIAGNOSIS — D696 Thrombocytopenia, unspecified: Secondary | ICD-10-CM | POA: Diagnosis present

## 2015-03-24 DIAGNOSIS — R569 Unspecified convulsions: Secondary | ICD-10-CM | POA: Diagnosis not present

## 2015-03-24 DIAGNOSIS — Z8249 Family history of ischemic heart disease and other diseases of the circulatory system: Secondary | ICD-10-CM | POA: Diagnosis not present

## 2015-03-24 DIAGNOSIS — J9601 Acute respiratory failure with hypoxia: Secondary | ICD-10-CM | POA: Diagnosis not present

## 2015-03-24 DIAGNOSIS — I129 Hypertensive chronic kidney disease with stage 1 through stage 4 chronic kidney disease, or unspecified chronic kidney disease: Secondary | ICD-10-CM | POA: Diagnosis present

## 2015-03-24 DIAGNOSIS — M1990 Unspecified osteoarthritis, unspecified site: Secondary | ICD-10-CM | POA: Diagnosis present

## 2015-03-24 DIAGNOSIS — E1122 Type 2 diabetes mellitus with diabetic chronic kidney disease: Secondary | ICD-10-CM | POA: Diagnosis present

## 2015-03-24 DIAGNOSIS — S32591A Other specified fracture of right pubis, initial encounter for closed fracture: Secondary | ICD-10-CM | POA: Diagnosis present

## 2015-03-24 LAB — BASIC METABOLIC PANEL
ANION GAP: 5 (ref 5–15)
BUN: 26 mg/dL — AB (ref 6–20)
CHLORIDE: 110 mmol/L (ref 101–111)
CO2: 25 mmol/L (ref 22–32)
Calcium: 8.2 mg/dL — ABNORMAL LOW (ref 8.9–10.3)
Creatinine, Ser: 1.42 mg/dL — ABNORMAL HIGH (ref 0.44–1.00)
GFR calc Af Amer: 37 mL/min — ABNORMAL LOW (ref >60)
GFR calc non Af Amer: 32 mL/min — ABNORMAL LOW (ref >60)
Glucose, Bld: 138 mg/dL — ABNORMAL HIGH (ref 70–99)
Potassium: 4.1 mmol/L (ref 3.5–5.1)
Sodium: 140 mmol/L (ref 135–145)

## 2015-03-24 LAB — LACTIC ACID, PLASMA
Lactic Acid, Venous: 1.1 mmol/L (ref 0.5–2.0)
Lactic Acid, Venous: 0.9 mmol/L (ref 0.5–2.0)

## 2015-03-24 LAB — URINE CULTURE
Colony Count: NO GROWTH
Culture: NO GROWTH

## 2015-03-24 LAB — FOLATE RBC
FOLATE, HEMOLYSATE: 432.9 ng/mL
Folate, RBC: 1652 ng/mL (ref >498)
HEMATOCRIT: 26.2 % — AB (ref 34.0–46.6)

## 2015-03-24 LAB — CBC
HEMATOCRIT: 25 % — AB (ref 36.0–46.0)
Hemoglobin: 8.2 g/dL — ABNORMAL LOW (ref 12.0–15.0)
MCH: 33.9 pg (ref 26.0–34.0)
MCHC: 32.8 g/dL (ref 30.0–36.0)
MCV: 103.3 fL — AB (ref 78.0–100.0)
Platelets: 151 10*3/uL (ref 150–400)
RBC: 2.42 MIL/uL — AB (ref 3.87–5.11)
RDW: 13.5 % (ref 11.5–15.5)
WBC: 9.4 10*3/uL (ref 4.0–10.5)

## 2015-03-24 LAB — PROCALCITONIN: Procalcitonin: 0.13 ng/mL

## 2015-03-24 MED ORDER — PIPERACILLIN-TAZOBACTAM 3.375 G IVPB
3.3750 g | Freq: Three times a day (TID) | INTRAVENOUS | Status: DC
  Administered 2015-03-24 – 2015-03-26 (×6): 3.375 g via INTRAVENOUS
  Filled 2015-03-24 (×10): qty 50

## 2015-03-24 MED ORDER — SODIUM CHLORIDE 0.9 % IV SOLN
INTRAVENOUS | Status: DC
  Administered 2015-03-24 – 2015-03-25 (×2): via INTRAVENOUS

## 2015-03-24 MED ORDER — VANCOMYCIN HCL 10 G IV SOLR
1500.0000 mg | Freq: Once | INTRAVENOUS | Status: AC
  Administered 2015-03-24: 1500 mg via INTRAVENOUS
  Filled 2015-03-24: qty 1500

## 2015-03-24 MED ORDER — SODIUM CHLORIDE 0.9 % IV SOLN: INTRAVENOUS | Status: DC

## 2015-03-24 MED ORDER — VANCOMYCIN HCL IN DEXTROSE 750-5 MG/150ML-% IV SOLN
750.0000 mg | INTRAVENOUS | Status: DC
  Administered 2015-03-25: 750 mg via INTRAVENOUS
  Filled 2015-03-24 (×3): qty 150

## 2015-03-24 MED ORDER — BOOST / RESOURCE BREEZE PO LIQD
1.0000 | ORAL | Status: DC
  Administered 2015-03-24 – 2015-03-26 (×3): 1 via ORAL

## 2015-03-24 NOTE — Progress Notes (Addendum)
Nutrition Brief Note  Patient identified on the [place] Report  Wt Readings from Last 15 Encounters:  03/23/15 170 lb (77.111 kg)  03/05/15 173 lb (78.472 kg)  01/06/15 167 lb (75.751 kg)  11/07/14 173 lb (78.472 kg)  07/17/14 179 lb (81.194 kg)  04/25/14 175 lb (79.379 kg)  04/10/14 175 lb 9 oz (79.635 kg)  03/25/14 185 lb 13.6 oz (84.3 kg)  02/14/14 171 lb (77.565 kg)  02/13/14 170 lb (77.111 kg)  01/24/14 171 lb (77.565 kg)  01/10/14 170 lb 12.8 oz (77.474 kg)  12/24/13 173 lb (78.472 kg)  10/29/13 181 lb 10.5 oz (82.4 kg)  10/25/13 182 lb 8.7 oz (82.8 kg)    Body mass index is 32.14 kg/(m^2). Patient meets criteria for Obese based on current BMI.   Spoke with Daughter. Patient is a resident at [facility] and was admitted after falling. The daughter reports no recent changes in appetite or oral intake or GI distress symptoms  Current diet order is Regular, patient consumed 35% of her only meal. Now NPO for procedure. Labs and medications reviewed.   She accepted snack offer. Will also order Resource breeze for pt to try.   No further nutrition interventions warranted at this time. If nutrition issues arise, please consult RD.   Burtis Junes RD, LDN Nutrition Pager: [phone] 03/24/2015 2:20 PM

## 2015-03-24 NOTE — Progress Notes (Signed)
PROGRESS NOTE  [name] [name] ATF:[id number] DOB: 1924-10-23 DOA: 03/22/2015 PCP: Sallee Lange, MD  Brief history 81 year old female with history of dementia, hypertension, hyperlipidemia presented to emergency department when she was found on the floor in the bathroom of her skilled nursing facility. Apparently, the patient was on the commode, and subsequently was found on the floor. It was unclear whether she had true syncope or had a mechanical fall. In emergency department, the patient was found to have laceration of her frontal scalp which was repaired. The patient was to be discharged back to the facility, but she had 2 syncopal episodes one of which she had bowel and bladder incontinence. As result, the patient was medically for further evaluation. At baseline, the patient is wheelchair bound. She requires at least 1 person max assist for transfers. Discussion about whether the patient is able to verbalize her needs due to her advanced dementia. On the evening of 03/23/2015, the patient spiked a temperature 102.52F. Assessment/Plan: Fever and tachycardia -pt spiked temp of 102.52F on the evening of 03/23/2015 -Concern about aspiration given the patient's episodes of syncope -CT abdomen and pelvis--difficult to discern if patient is having true abdominal pain -Urinalysis negative for pyuria -Blood cultures 2 sets -Start empiric vanco and zosyn -check lactic acid, PCT -restart IVF Syncope -Echocardiogram--pending -EEG--pending -Orthostatic vital signs--neg (lying and sitting only--cannot stand) -Gentle hydration Acute on chronic renal failure (CKD3) -Likely due to volume depletion -Baseline creatinine 1.0-1.2  -Hold ARB and reevaluate in the next 24 hours--BP is also soft Atypical chest pain -Likely due to the patient's mechanical fall -Cycle troponins--neg x 4 -EKG sinus rhythm without any concerning ST-T wave change -Continue aspirin Dementia -Family states that  the patient's mental status is back to baseline -Continue Namenda and Aricept Lower extremity pain and edema -Venous duplex--neg for DVT Gait instability/fall -PT evaluation-->SNF   Family Communication:   Daughter updated at beside Disposition Plan:   Home when medically stable       Procedures/Studies: Dg Chest 2 View  03/22/2015   CLINICAL DATA:  [address] off toilet, hit head on counter. Became unresponsive in emergency department.  EXAM: CHEST  2 VIEW  COMPARISON:  Chest radiograph Mar 27, 2014 weeks and CT of the abdomen and pelvis October 26, 2014  FINDINGS: The cardiac silhouette is upper limits of normal in size, tortuous calcified aorta. No pleural effusion or focal consolidation. No pneumothorax. ACDF. Osteopenia. Soft tissue planes are nonsuspicious. Moderate hiatal hernia better seen on prior CT. L1 burst fracture was present previously.  IMPRESSION: Borderline cardiomegaly, no acute pulmonary process.   Electronically Signed   By: Elon Alas   On: 03/22/2015 22:51   Ct Head Wo Contrast  03/22/2015   CLINICAL DATA:  Status post fall.  Initial encounter.  EXAM: CT HEAD WITHOUT CONTRAST  CT CERVICAL SPINE WITHOUT CONTRAST  TECHNIQUE: Multidetector CT imaging of the head and cervical spine was performed following the standard protocol without intravenous contrast. Multiplanar CT image reconstructions of the cervical spine were also generated.  COMPARISON:  Head CT 06/10/2014.  Cervical spine CT 06/16/2013.  FINDINGS: CT HEAD FINDINGS  There is no evidence of acute intracranial hemorrhage, mass lesion, brain edema or extra-axial fluid collection. There is diffuse prominence of the ventricles and subarachnoid spaces consistent with moderate atrophy, similar to the prior examination. Extensive periventricular and subcortical white matter disease appears unchanged. There is no evidence of acute cortical infarct.  Soft tissue  swelling is present in the right frontal scalp. There is no  evidence of calvarial fracture. The visualized paranasal sinuses, mastoid air cells and middle ears are clear. 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  CT CERVICAL SPINE FINDINGS  The cervical alignment is stable status post C6-7 ACDF with an anterior plate and screws. Interbody fusion appears solid. There is a convex left scoliosis with a degenerative grade 1 anterolisthesis at C5-6. There is no evidence acute fracture or traumatic subluxation. Multilevel degenerative changes are present with asymmetric right-sided facet disease. There is prominent synovial thickening surrounding the odontoid process. No acute soft tissue findings identified. Internal carotid artery calcifications noted.  IMPRESSION: 1. Right frontal scalp soft tissue injury. 2. No acute intracranial or calvarial findings. 3. No evidence of acute cervical spine fracture, traumatic subluxation or static signs of instability.   Electronically Signed   By: Richardean Sale M.D.   On: 03/22/2015 17:35   Ct Cervical Spine Wo Contrast  03/22/2015   CLINICAL DATA:  Status post fall.  Initial encounter.  EXAM: CT HEAD WITHOUT CONTRAST  CT CERVICAL SPINE WITHOUT CONTRAST  TECHNIQUE: Multidetector CT imaging of the head and cervical spine was performed following the standard protocol without intravenous contrast. Multiplanar CT image reconstructions of the cervical spine were also generated.  COMPARISON:  Head CT 06/10/2014.  Cervical spine CT 06/16/2013.  FINDINGS: CT HEAD FINDINGS  There is no evidence of acute intracranial hemorrhage, mass lesion, brain edema or extra-axial fluid collection. There is diffuse prominence of the ventricles and subarachnoid spaces consistent with moderate atrophy, similar to the prior examination. Extensive periventricular and subcortical white matter disease appears unchanged. There is no evidence of acute cortical infarct.  Soft tissue swelling is present in the right frontal scalp. There is no evidence of calvarial  fracture. The visualized paranasal sinuses, mastoid air cells and middle ears are clear. 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  CT CERVICAL SPINE FINDINGS  The cervical alignment is stable status post C6-7 ACDF with an anterior plate and screws. Interbody fusion appears solid. There is a convex left scoliosis with a degenerative grade 1 anterolisthesis at C5-6. There is no evidence acute fracture or traumatic subluxation. Multilevel degenerative changes are present with asymmetric right-sided facet disease. There is prominent synovial thickening surrounding the odontoid process. No acute soft tissue findings identified. Internal carotid artery calcifications noted.  IMPRESSION: 1. Right frontal scalp soft tissue injury. 2. No acute intracranial or calvarial findings. 3. No evidence of acute cervical spine fracture, traumatic subluxation or static signs of instability.   Electronically Signed   By: Richardean Sale M.D.   On: 03/22/2015 17:35   US Venous Img Lower Bilateral  03/24/2015   EXAM: BILATERAL LOWER EXTREMITY VENOUS DOPPLER ULTRASOUND  TECHNIQUE: Gray-scale sonography with graded compression, as well as color Doppler and duplex ultrasound were performed to evaluate the lower extremity deep venous systems from the level of the common femoral vein and including the common femoral, femoral, profunda femoral, popliteal and calf veins including the posterior tibial, peroneal and gastrocnemius veins when visible. The superficial great saphenous vein was also interrogated. Spectral Doppler was utilized to evaluate flow at rest and with distal augmentation maneuvers in the common femoral, femoral and popliteal veins.  COMPARISON:  None.  FINDINGS: RIGHT LOWER EXTREMITY  Common Femoral Vein: No evidence of thrombus. Normal compressibility, respiratory phasicity and response to augmentation.  Saphenofemoral Junction: No evidence of thrombus. Normal compressibility and flow on color Doppler imaging.  Profunda  Femoral Vein: No evidence of thrombus. Normal compressibility and flow on color Doppler imaging.  Femoral Vein: No evidence of thrombus. Normal compressibility, respiratory phasicity and response to augmentation.  Popliteal Vein: No evidence of thrombus. Normal compressibility, respiratory phasicity and response to augmentation.  Calf Veins: No evidence of thrombus. Normal compressibility and flow on color Doppler imaging.  Superficial Great Saphenous Vein: No evidence of thrombus. Normal compressibility and flow on color Doppler imaging.  Venous Reflux:  None.  Other Findings:  None.  LEFT LOWER EXTREMITY  Common Femoral Vein: No evidence of thrombus. Normal compressibility, respiratory phasicity and response to augmentation.  Saphenofemoral Junction: No evidence of thrombus. Normal compressibility and flow on color Doppler imaging.  Profunda Femoral Vein: No evidence of thrombus. Normal compressibility and flow on color Doppler imaging.  Femoral Vein: No evidence of thrombus. Normal compressibility, respiratory phasicity and response to augmentation.  Popliteal Vein: No evidence of thrombus. Normal compressibility, respiratory phasicity and response to augmentation.  Calf Veins: No evidence of thrombus. Normal compressibility and flow on color Doppler imaging.  Superficial Great Saphenous Vein: No evidence of thrombus. Normal compressibility and flow on color Doppler imaging.  Venous Reflux:  None.  Other Findings: This is a limited exam due to patient movement and pain tolerance.  IMPRESSION: No evidence of deep venous thrombosis. Exam was limited due to patient movement and pain tolerance.   Electronically Signed   By: Marcello Moores  Register   On: 03/24/2015 08:06   Dg Shoulder Left  03/05/2015   CLINICAL DATA:  Left shoulder pain several days. No injury. Alzheimer's disease.  EXAM: LEFT SHOULDER - 2+ VIEW  COMPARISON:  Chest x-ray 03/27/2014  FINDINGS: There are mild degenerative changes of the [facility] joint greater  than the glenohumeral joint. No fracture or dislocation.  IMPRESSION: No acute findings.  Mild degenerative changes.   Electronically Signed   By: Marin Olp M.D.   On: 03/05/2015 13:14         Subjective: Patient was pleasantly confused. She denies any shortness of breath but cannot tell me if she is having any pain, vomiting, diarrhea, or headache. There are no reports of respiratory distress, uncontrolled pain, diarrhea.  Objective: Filed Vitals:   03/23/15 1500 03/23/15 2236 03/24/15 0128 03/24/15 0555  BP: 135/62 132/75  108/77  Pulse: 82 102  83  Temp: 100.3 F (37.9 C) 102.6 F (39.2 C) 98 F (36.7 C) 98.9 F (37.2 C)  TempSrc: Axillary Axillary Axillary Axillary  Resp: 18 18  22   Height:      Weight:      SpO2: 98% 96%  98%    Intake/Output Summary (Last 24 hours) at 03/24/15 1111 Last data filed at 03/24/15 0941  Gross per 24 hour  Intake 599.75 ml  Output      0 ml  Net 599.75 ml   Weight change:  Exam:   General:  Pt is alert, follows commands appropriately, not in acute distress  HEENT: No icterus, No thrush, [place]/AT  Cardiovascular: RRR, S1/S2, no rubs, no gallops  Respiratory: Poor inspiratory effort, basilar rales. Upper airway wheeze.  Abdomen: Soft/+BS, non tender, non distended, no guarding  Extremities: 1+LE edema, No lymphangitis, No petechiae, No rashes, no synovitis  Data Reviewed: Basic Metabolic Panel:  Recent Labs Lab 03/22/15 2014 03/23/15 0659 03/24/15 0718  NA 137 139 140  K 4.8 4.9 4.1  CL 105 108 110  CO2 25 24 25   GLUCOSE 185* 128* 138*  BUN 31* 25* 26*  CREATININE 1.43* 1.09* 1.42*  CALCIUM 8.6* 8.6* 8.2*   Liver Function Tests: No results for input(s): AST, ALT, ALKPHOS, BILITOT, PROT, ALBUMIN in the last 168 hours. No results for input(s): LIPASE, AMYLASE in the last 168 hours. No results for input(s): AMMONIA in the last 168 hours. CBC:  Recent Labs Lab 03/22/15 2014 03/23/15 0905 03/24/15 0718  WBC  15.8* 7.8 9.4  NEUTROABS 13.4*  --   --   HGB 10.0* 9.2* 8.2*  HCT 30.9* 28.1* 25.0*  MCV 104.4* 103.7* 103.3*  PLT 208 186 151   Cardiac Enzymes:  Recent Labs Lab 03/22/15 2014 03/23/15 0659 03/23/15 0905 03/23/15 1430  TROPONINI 0.03 0.03 0.03 0.04*   BNP: Invalid input(s): POCBNP CBG: No results for input(s): GLUCAP in the last 168 hours.  Recent Results (from the past 240 hour(s))  Culture, blood (routine x 2)     Status: None (Preliminary result)   Collection Time: 03/24/15 12:25 AM  Result Value Ref Range Status   Specimen Description BLOOD RIGHT HAND  Final   Special Requests   Final    BOTTLES DRAWN AEROBIC AND ANAEROBIC AEB 8CC ANA [place]   Culture NO GROWTH <24 HRS  Final   Report Status PENDING  Incomplete  Culture, blood (routine x 2)     Status: None (Preliminary result)   Collection Time: 03/24/15 12:40 AM  Result Value Ref Range Status   Specimen Description BLOOD RIGHT ANTECUBITAL  Final   Special Requests BOTTLES DRAWN AEROBIC AND ANAEROBIC 8CC EACH  Final   Culture NO GROWTH <24 HRS  Final   Report Status PENDING  Incomplete     Scheduled Meds: . aspirin EC  81 mg Oral Daily  . citalopram  20 mg Oral Daily  . donepezil  5 mg Oral QHS  . memantine  10 mg Oral BID  . pantoprazole  40 mg Oral Daily  . polyvinyl alcohol  1 drop Both Eyes TID  . primidone  25 mg Oral QHS  . psyllium  1 packet Oral QHS  . sodium chloride  3 mL Intravenous Q12H   Continuous Infusions: . sodium chloride Stopped (03/23/15 2027)     Rilan Eiland, DO  [facility] Pager [phone]  If 7PM-7AM, please contact night-coverage [email or web address] Password [email or web address] 03/24/2015, 11:11 AM

## 2015-03-24 NOTE — Progress Notes (Signed)
ANTIBIOTIC CONSULT NOTE - INITIAL  Pharmacy Consult for Vancomycin and Zosyn Indication: rule out sepsis  Allergies  Allergen Reactions  . Band-Aid Liquid Bandage [Dermagran]     Use non-stick gauze with paper tape  . Codeine Other (See Comments)    REACTION: Unknown  . Dilaudid [Hydromorphone Hcl]     Caused agitation, hallucinations, could not sleep, pt made funny noises like laughing / crying.  . Morphine Nausea And Vomiting  . Other     NO CITRUS, TOMATO BASED PRODUCTS OR CHOCOLATE - BECAUSE OF REFLUX PT'S FAMILY STATES THAT ANY TIME PT IS GIVEN NARCOTICS SHE HALLUCINATES AND TRIES TO GET UP OUT W/C OR BED Medications have to be crushed and put in apple sauce.    Patient Measurements: Height: 5\' 1"  (154.9 cm) Weight: 170 lb (77.111 kg) IBW/kg (Calculated) : 47.8  Vital Signs: Temp: 98.9 F (37.2 C) (05/03 0555) Temp Source: Axillary (05/03 0555) BP: 108/77 mmHg (05/03 0555) Pulse Rate: 83 (05/03 0555) Intake/Output from previous day: 05/02 0701 - 05/03 0700 In: 476.8 [I.V.:476.8] Out: -  Intake/Output from this shift: Total I/O In: 123 [P.O.:120; I.V.:3] Out: -   Labs:  Recent Labs  03/22/15 2014 03/23/15 0659 03/23/15 0905 03/24/15 0718  WBC 15.8*  --  7.8 9.4  HGB 10.0*  --  9.2* 8.2*  PLT 208  --  186 151  CREATININE 1.43* 1.09*  --  1.42*   Estimated Creatinine Clearance: 25.7 mL/min (by C-G formula based on Cr of 1.42). No results for input(s): VANCOTROUGH, VANCOPEAK, VANCORANDOM, GENTTROUGH, GENTPEAK, GENTRANDOM, TOBRATROUGH, TOBRAPEAK, TOBRARND, AMIKACINPEAK, AMIKACINTROU, AMIKACIN in the last 72 hours.   Microbiology: Recent Results (from the past 720 hour(s))  Culture, blood (routine x 2)     Status: None (Preliminary result)   Collection Time: 03/24/15 12:25 AM  Result Value Ref Range Status   Specimen Description BLOOD RIGHT HAND  Final   Special Requests   Final    BOTTLES DRAWN AEROBIC AND ANAEROBIC AEB=8CC ANA=6CC   Culture NO GROWTH  <24 HRS  Final   Report Status PENDING  Incomplete  Culture, blood (routine x 2)     Status: None (Preliminary result)   Collection Time: 03/24/15 12:40 AM  Result Value Ref Range Status   Specimen Description BLOOD RIGHT ANTECUBITAL  Final   Special Requests BOTTLES DRAWN AEROBIC AND ANAEROBIC 8CC EACH  Final   Culture NO GROWTH <24 HRS  Final   Report Status PENDING  Incomplete    Medical History: Past Medical History  Diagnosis Date  . ASCVD (arteriosclerotic cardiovascular disease) 2003    Single vessel disease-60% D1  . Hypertension   . Hyperlipidemia   . Leukopenia     and anemia-hematology evaluation was nodiagnostic  . Degenerative joint disease     right TKA in 4/06  . Upper GI bleed     gastric stress ulcer in 2003  . Retinal artery branch occlusion of right eye     Sudden onset of right eye blindness-embolism suspected  . Gastroesophageal reflux disease with hiatal hernia   . Osteoporosis   . Lung nodule     Left lower lobe  . Parkinson's disease     PRESUMED BECAUSE SHE HAS A LOT OF SHAKING - BUT HAS SEEN NEUROLOGIST  AND WAS PUT ON MYSOLINE AND FAMILY HAS SEEN BIG IMPROVEMENT IN THE SHAKING  . Cervical spondylarthritis     S/p discectomy  . Diverticulosis   . Hiatal hernia   . Leukocytosis   .  Hyperlipidemia   . Degenerative joint disease   . GI bleed   . Cervical spondylarthritis   . Diverticulosis   . Shortness of breath     AND WHEEZING WITH ACTIVITY AND AT REST  . Coronary artery disease   . 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 Diabetes mellitus     No insulin- NO MEDS - LOST WEIGHT - NO LONGER HAS ELEVATED SUGARS  . History of nephrolithiasis   . Dementia     PT RESIDES AT [facility] [place] [facility]IV[facility] - SHE TALKS SOME - DOES NOT AMBULATE- TRANSFERS TO W/C WITH 2 ASSISTANTS  . Alzheimer disease   . Senile dementia   . Alzheimer's disease    Anti-infectives    Start     Dose/Rate Route  Frequency Ordered Stop   03/25/15 1200  vancomycin (VANCOCIN) IVPB 750 mg/150 ml premix     750 mg 150 mL/hr over 60 Minutes Intravenous Every 24 hours 03/24/15 1246     03/24/15 1400  piperacillin-tazobactam (ZOSYN) IVPB 3.375 g     3.375 g 12.5 mL/hr over 240 Minutes Intravenous 3 times per day 03/24/15 1123     03/24/15 1230  vancomycin (VANCOCIN) 1,500 mg in sodium chloride 0.9 % 500 mL IVPB     1,500 mg 250 mL/hr over 120 Minutes Intravenous  Once 03/24/15 1127       Assessment: 81yo female admitted after fall at [facility].  Asked to initiate Vancomycin and Zosyn for fever and suspected sepsis.  SCr is elevated.   Goal of Therapy:  Vancomycin trough level 15-20 mcg/ml  Plan:  Vancomycin 1500mg  IV today x 1 then Vancomycin 750mg  IV q24hrs Check trough at steady state Zosyn 3.375gm IV q8h, each dose over 4 hrs Monitor labs, renal fxn, cultures, and progress Deescalate ABX when appropriate  Hart Robinsons A 03/24/2015,12:47 PM

## 2015-03-25 ENCOUNTER — Inpatient Hospital Stay (HOSPITAL_COMMUNITY)
Admit: 2015-03-25 | Discharge: 2015-03-25 | Disposition: A | Discharge status: Home / Self Care | Payer: Medicare Other | Attending: Internal Medicine | Admitting: Internal Medicine

## 2015-03-25 ENCOUNTER — Inpatient Hospital Stay (HOSPITAL_COMMUNITY): Payer: Medicare Other

## 2015-03-25 DIAGNOSIS — R509 Fever, unspecified: Secondary | ICD-10-CM

## 2015-03-25 DIAGNOSIS — S0990XA Unspecified injury of head, initial encounter: Secondary | ICD-10-CM | POA: Insufficient documentation

## 2015-03-25 DIAGNOSIS — G20 Parkinson's disease: Secondary | ICD-10-CM

## 2015-03-25 LAB — COMPREHENSIVE METABOLIC PANEL
ALT: 19 U/L (ref 14–54)
ANION GAP: 6 (ref 5–15)
AST: 19 U/L (ref 15–41)
Albumin: 2.8 g/dL — ABNORMAL LOW (ref 3.5–5.0)
Alkaline Phosphatase: 27 U/L — ABNORMAL LOW (ref 38–126)
BUN: 23 mg/dL — ABNORMAL HIGH (ref 6–20)
CO2: 24 mmol/L (ref 22–32)
Calcium: 7.3 mg/dL — ABNORMAL LOW (ref 8.9–10.3)
Chloride: 108 mmol/L (ref 101–111)
Creatinine, Ser: 1.31 mg/dL — ABNORMAL HIGH (ref 0.44–1.00)
GFR calc Af Amer: 41 mL/min — ABNORMAL LOW (ref >60)
GFR calc non Af Amer: 35 mL/min — ABNORMAL LOW (ref >60)
GLUCOSE: 117 mg/dL — AB (ref 70–99)
Potassium: 3.8 mmol/L (ref 3.5–5.1)
Sodium: 138 mmol/L (ref 135–145)
TOTAL PROTEIN: 5.4 g/dL — AB (ref 6.5–8.1)
Total Bilirubin: 0.5 mg/dL (ref 0.3–1.2)

## 2015-03-25 LAB — CBC
HCT: 22.9 % — ABNORMAL LOW (ref 36.0–46.0)
Hemoglobin: 7.5 g/dL — ABNORMAL LOW (ref 12.0–15.0)
MCH: 34.2 pg — ABNORMAL HIGH (ref 26.0–34.0)
MCHC: 32.8 g/dL (ref 30.0–36.0)
MCV: 104.6 fL — AB (ref 78.0–100.0)
PLATELETS: 116 10*3/uL — AB (ref 150–400)
RBC: 2.19 MIL/uL — ABNORMAL LOW (ref 3.87–5.11)
RDW: 13.6 % (ref 11.5–15.5)
WBC: 6.8 10*3/uL (ref 4.0–10.5)

## 2015-03-25 NOTE — Consult Note (Signed)
Reason for Consult:fracture pelvis Referring Physician: Hospitalist  [name] [name] is an 81 y.o. female.  HPI: Patient is chronically disoriented and a resident at local living facility.  She was found on the bathroom floor at the facility on Sunday.  She has had syncope here.  She had CT scan yesterday which showed a fracture of the right pelvis superior and inferior pubic rami.  She has held her lower abdomen and grunted but she cannot express or communicate thoughts.  She is completely disoriented and non communicative except for grunts or screams.  Her family is present and have a way to evaluate her by their experience with her jesters. The CT scan also showed a fracture of L1 listed as stable.  I see no evidence of prior report of a L1 compression fracture in the records.  I will order a x-ray of the back today.  Family says she does not walk at the nursing home and is helped to transfer to the wheelchair and back to bed.  They do not know how she ended up on the floor at the facility.    Past Medical History  Diagnosis Date  . ASCVD (arteriosclerotic cardiovascular disease) 2003    Single vessel disease-60% D1  . Hypertension   . Hyperlipidemia   . Leukopenia     and anemia-hematology evaluation was nodiagnostic  . Degenerative joint disease     right TKA in 4/06  . Upper GI bleed     gastric stress ulcer in 2003  . Retinal artery branch occlusion of right eye     Sudden onset of right eye blindness-embolism suspected  . Gastroesophageal reflux disease with hiatal hernia   . Osteoporosis   . Lung nodule     Left lower lobe  . Parkinson's disease     PRESUMED BECAUSE SHE HAS A LOT OF SHAKING - BUT HAS SEEN NEUROLOGIST  AND WAS PUT ON MYSOLINE AND FAMILY HAS SEEN BIG IMPROVEMENT IN THE SHAKING  . Cervical spondylarthritis     S/p discectomy  . Diverticulosis   . Hiatal hernia   . Leukocytosis   . Hyperlipidemia   . Degenerative joint disease   . GI bleed   . Cervical  spondylarthritis   . Diverticulosis   . Shortness of breath     AND WHEEZING WITH ACTIVITY AND AT REST  . Coronary artery disease   . 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 Diabetes mellitus     No insulin- NO MEDS - LOST WEIGHT - NO LONGER HAS ELEVATED SUGARS  . History of nephrolithiasis   . Dementia     PT RESIDES AT [facility] [place] [facility] - ASSISTED LIVING - SHE TALKS SOME - DOES NOT AMBULATE- TRANSFERS TO W/C WITH 2 ASSISTANTS  . Alzheimer disease   . Senile dementia   . Alzheimer's disease     Past Surgical History  Procedure Laterality Date  . Total abdominal hysterectomy    . Cervical discectomy      Dr. Sherwood Gambler  . Cholecystectomy    . Total knee arthroplasty  2006    Right; Dr. Percell Miller  . Colonoscopy  2009  . Joint replacement    . Cystoscopy w/ ureteral stent placement  09/23/2011    Procedure: CYSTOSCOPY WITH RETROGRADE PYELOGRAM/URETERAL STENT PLACEMENT;  Surgeon: Marissa Nestle;  Location: [facility];  Service: Urology;  Laterality: Right;  . Esophagogastroduodenoscopy N/A 10/31/2013  Procedure: ESOPHAGOGASTRODUODENOSCOPY (EGD);  Surgeon: Rogene Houston, MD;  Location: [facility];  Service: Endoscopy;  Laterality: N/A;  . Vascular surgery      LASER OF RT EYE AFTER EYE STROKE  . Cystoscopy/retrograde/ureteroscopy Left 03/25/2014    Procedure: CYSTOSCOPY WITH  LEFT URETEROSCOPY AND LEFT RETROGRADE  STENT PLACEMENT;  Surgeon: Irine Seal, MD;  Location: [facility];  Service: Urology;  Laterality: Left;  . Holmium laser application Left 07/30/7740    Procedure: HOLMIUM LASER APPLICATION;  Surgeon: Irine Seal, MD;  Location: [facility];  Service: Urology;  Laterality: Left;  . Cystoscopy/retrograde/ureteroscopy/stone extraction with basket Left 04/10/2014    Procedure: LEFT SECOND STAGE URETEORSCOPY STONE EXTRACTION;  Surgeon: Malka So, MD;  Location: [facility];  Service: Urology;  Laterality: Left;  With STENT  . Holmium laser  application  03/14/9531    Procedure: HOLMIUM LASER APPLICATION;  Surgeon: Malka So, MD;  Location: [facility];  Service: Urology;;    Family History  Problem Relation Age of Onset  . Diabetes Sister   . Heart disease Sister   . Diabetes Brother     Social History:  reports that she has never smoked. She has never used smokeless tobacco. She reports that she does not drink alcohol or use illicit drugs.  Allergies:  Allergies  Allergen Reactions  . Band-Aid Liquid Bandage [Dermagran]     Use non-stick gauze with paper tape  . Codeine Other (See Comments)    REACTION: Unknown  . Dilaudid [Hydromorphone Hcl]     Caused agitation, hallucinations, could not sleep, pt made funny noises like laughing / crying.  . Morphine Nausea And Vomiting  . Other     NO CITRUS, TOMATO BASED PRODUCTS OR CHOCOLATE - BECAUSE OF REFLUX PT'S FAMILY STATES THAT ANY TIME PT IS GIVEN NARCOTICS SHE HALLUCINATES AND TRIES TO GET UP OUT W/C OR BED Medications have to be crushed and put in apple sauce.    Medications: I have reviewed the patient's current medications.  Results for orders placed or performed during the hospital encounter of 03/22/15 (from the past 48 hour(s))  Troponin I     Status: None   Collection Time: 03/23/15  9:05 AM  Result Value Ref Range   Troponin I 0.03 <0.031 ng/mL    Comment:        NO INDICATION OF MYOCARDIAL INJURY.   CBC     Status: Abnormal   Collection Time: 03/23/15  9:05 AM  Result Value Ref Range   WBC 7.8 4.0 - 10.5 K/uL   RBC 2.71 (L) 3.87 - 5.11 MIL/uL   Hemoglobin 9.2 (L) 12.0 - 15.0 g/dL   HCT 28.1 (L) 36.0 - 46.0 %   MCV 103.7 (H) 78.0 - 100.0 fL   MCH 33.9 26.0 - 34.0 pg   MCHC 32.7 30.0 - 36.0 g/dL   RDW 13.3 11.5 - 15.5 %   Platelets 186 150 - 400 K/uL  Troponin I     Status: Abnormal   Collection Time: 03/23/15  2:30 PM  Result Value Ref Range   Troponin I 0.04 (H) <0.031 ng/mL    Comment:        PERSISTENTLY INCREASED TROPONIN VALUES IN THE  RANGE OF 0.04-0.49 ng/mL CAN BE SEEN IN:       -UNSTABLE ANGINA       -CONGESTIVE HEART FAILURE       -MYOCARDITIS       -CHEST TRAUMA       -  ARRYHTHMIAS       -LATE PRESENTING MYOCARDIAL INFARCTION       -COPD   CLINICAL FOLLOW-UP RECOMMENDED.   Vitamin B12     Status: Abnormal   Collection Time: 03/23/15  2:30 PM  Result Value Ref Range   Vitamin B-12 972 (H) 180 - 914 pg/mL    Comment: (NOTE) This assay is not validated for testing neonatal or myeloproliferative syndrome specimens for Vitamin B12 levels. Performed at [facility]   Folate RBC     Status: Abnormal   Collection Time: 03/23/15  2:30 PM  Result Value Ref Range   Folate, Hemolysate 432.9 Not Estab. ng/mL   Hematocrit 26.2 (L) 34.0 - 46.6 %   Folate, RBC 1652 >498 ng/mL    Comment: (NOTE) Performed At: [facility] [place], [place] [id number] Lindon Romp MD YQ:[id number]   Urine culture     Status: None   Collection Time: 03/23/15  4:18 PM  Result Value Ref Range   Specimen Description URINE, CATHETERIZED    Special Requests NONE    Colony Count NO GROWTH Performed at [facility]     Culture NO GROWTH Performed at [facility]     Report Status 03/24/2015 FINAL   Urinalysis, Routine w reflex microscopic     Status: None   Collection Time: 03/23/15  4:18 PM  Result Value Ref Range   Color, Urine YELLOW YELLOW   APPearance CLEAR CLEAR   Specific Gravity, Urine 1.020 1.005 - 1.030   pH 6.5 5.0 - 8.0   Glucose, UA NEGATIVE NEGATIVE mg/dL   Hgb urine dipstick NEGATIVE NEGATIVE   Bilirubin Urine NEGATIVE NEGATIVE   Ketones, ur NEGATIVE NEGATIVE mg/dL   Protein, ur NEGATIVE NEGATIVE mg/dL   Urobilinogen, UA 0.2 0.0 - 1.0 mg/dL   Nitrite NEGATIVE NEGATIVE   Leukocytes, UA NEGATIVE NEGATIVE    Comment: MICROSCOPIC NOT DONE ON URINES WITH NEGATIVE PROTEIN, BLOOD, LEUKOCYTES, NITRITE, OR GLUCOSE <1000 mg/dL.  Culture, blood (routine x 2)     Status: None  (Preliminary result)   Collection Time: 03/24/15 12:25 AM  Result Value Ref Range   Specimen Description BLOOD RIGHT HAND    Special Requests      BOTTLES DRAWN AEROBIC AND ANAEROBIC AEB=8CC ANA=6CC   Culture NO GROWTH <24 HRS    Report Status PENDING   Culture, blood (routine x 2)     Status: None (Preliminary result)   Collection Time: 03/24/15 12:40 AM  Result Value Ref Range   Specimen Description BLOOD RIGHT ANTECUBITAL    Special Requests BOTTLES DRAWN AEROBIC AND ANAEROBIC 8CC EACH    Culture NO GROWTH <24 HRS    Report Status PENDING   Basic metabolic panel     Status: Abnormal   Collection Time: 03/24/15  7:18 AM  Result Value Ref Range   Sodium 140 135 - 145 mmol/L   Potassium 4.1 3.5 - 5.1 mmol/L   Chloride 110 101 - 111 mmol/L   CO2 25 22 - 32 mmol/L   Glucose, Bld 138 (H) 70 - 99 mg/dL   BUN 26 (H) 6 - 20 mg/dL   Creatinine, Ser 1.42 (H) 0.44 - 1.00 mg/dL   Calcium 8.2 (L) 8.9 - 10.3 mg/dL   GFR calc non Af Amer 32 (L) >60 mL/min   GFR calc Af Amer 37 (L) >60 mL/min    Comment: (NOTE) The eGFR has been calculated using the CKD EPI equation. This calculation has  not been validated in all clinical situations. eGFR's persistently <90 mL/min signify possible Chronic Kidney Disease.    Anion gap 5 5 - 15  CBC     Status: Abnormal   Collection Time: 03/24/15  7:18 AM  Result Value Ref Range   WBC 9.4 4.0 - 10.5 K/uL   RBC 2.42 (L) 3.87 - 5.11 MIL/uL   Hemoglobin 8.2 (L) 12.0 - 15.0 g/dL   HCT 25.0 (L) 36.0 - 46.0 %   MCV 103.3 (H) 78.0 - 100.0 fL   MCH 33.9 26.0 - 34.0 pg   MCHC 32.8 30.0 - 36.0 g/dL   RDW 13.5 11.5 - 15.5 %   Platelets 151 150 - 400 K/uL  Lactic acid, plasma     Status: None   Collection Time: 03/24/15 11:31 AM  Result Value Ref Range   Lactic Acid, Venous 1.1 0.5 - 2.0 mmol/L  Procalcitonin - Baseline     Status: None   Collection Time: 03/24/15 11:31 AM  Result Value Ref Range   Procalcitonin 0.13 ng/mL    Comment:         Interpretation: PCT (Procalcitonin) <= 0.5 ng/mL: Systemic infection (sepsis) is not likely. Local bacterial infection is possible. (NOTE)         ICU PCT Algorithm               Non ICU PCT Algorithm    ----------------------------     ------------------------------         PCT < 0.25 ng/mL                 PCT < 0.1 ng/mL     Stopping of antibiotics            Stopping of antibiotics       strongly encouraged.               strongly encouraged.    ----------------------------     ------------------------------       PCT level decrease by               PCT < 0.25 ng/mL       >= 80% from peak PCT       OR PCT 0.25 - 0.5 ng/mL          Stopping of antibiotics                                             encouraged.     Stopping of antibiotics           encouraged.    ----------------------------     ------------------------------       PCT level decrease by              PCT >= 0.25 ng/mL       < 80% from peak PCT        AND PCT >= 0.5 ng/mL            Continuin g antibiotics                                              encouraged.       Continuing antibiotics            encouraged.    ----------------------------     ------------------------------  PCT level increase compared          PCT > 0.5 ng/mL         with peak PCT AND          PCT >= 0.5 ng/mL             Escalation of antibiotics                                          strongly encouraged.      Escalation of antibiotics        strongly encouraged.   Lactic acid, plasma     Status: None   Collection Time: 03/24/15  2:22 PM  Result Value Ref Range   Lactic Acid, Venous 0.9 0.5 - 2.0 mmol/L    Ct Abdomen Pelvis Wo Contrast  03/24/2015   CLINICAL DATA:  Abdominal pain and fever.  EXAM: CT ABDOMEN AND PELVIS WITHOUT CONTRAST  TECHNIQUE: Multidetector CT imaging of the abdomen and pelvis was performed following the standard protocol without IV contrast.  COMPARISON:  10/26/2014  FINDINGS: Lower chest: The lung bases  demonstrate respiratory motion. There is bibasilar atelectasis. The heart is within normal limits in size. Coronary artery calcifications are noted. There is a large hiatal hernia.  Hepatobiliary: No focal hepatic lesions or intrahepatic biliary dilatation. The gallbladder is surgically absent. No significant common bile duct dilatation.  Pancreas: Diffuse fatty change but no inflammation or mass.  Spleen: Normal.  Adrenals/Urinary Tract: A right renal calculus is noted. Stable prominent extra renal pelves bilaterally. No obstructing ureteral calculi or bladder calculi. There is a stable complex cyst associated with the right kidney with multiple thin septations and calcifications.  Stomach/Bowel: The stomach, duodenum, small bowel and colon are grossly normal. No inflammatory changes, mass lesions or obstructive findings.  Vascular/Lymphatic: No mesenteric or retroperitoneal mass or adenopathy. The aorta is normal in caliber. Stable atherosclerotic calcifications.  Other: No pelvic mass or adenopathy. The bladder appears normal. No inguinal mass or adenopathy.  Musculoskeletal: There are superior and inferior pubic rami fractures on the right side which appear to be acute. No hip fracture. No definite sacral fractures. There is a the stable L1 compression fracture.  IMPRESSION: 1. No acute abdominal/pelvic findings. 2. Stable complex cystic lesion associated with the right kidney. 3. Large hiatal hernia. 4. Acute right-sided pubic rami fractures.   Electronically Signed   By: Marijo Sanes M.D.   On: 03/24/2015 14:38   US Venous Img Lower Bilateral  03/24/2015   EXAM: BILATERAL LOWER EXTREMITY VENOUS DOPPLER ULTRASOUND  TECHNIQUE: Gray-scale sonography with graded compression, as well as color Doppler and duplex ultrasound were performed to evaluate the lower extremity deep venous systems from the level of the common femoral vein and including the common femoral, femoral, profunda femoral, popliteal and calf  veins including the posterior tibial, peroneal and gastrocnemius veins when visible. The superficial great saphenous vein was also interrogated. Spectral Doppler was utilized to evaluate flow at rest and with distal augmentation maneuvers in the common femoral, femoral and popliteal veins.  COMPARISON:  None.  FINDINGS: RIGHT LOWER EXTREMITY  Common Femoral Vein: No evidence of thrombus. Normal compressibility, respiratory phasicity and response to augmentation.  Saphenofemoral Junction: No evidence of thrombus. Normal compressibility and flow on color Doppler imaging.  Profunda Femoral Vein: No evidence of thrombus. Normal compressibility and flow on color Doppler imaging.  Femoral Vein: No evidence of thrombus. Normal compressibility, respiratory phasicity and response to augmentation.  Popliteal Vein: No evidence of thrombus. Normal compressibility, respiratory phasicity and response to augmentation.  Calf Veins: No evidence of thrombus. Normal compressibility and flow on color Doppler imaging.  Superficial Great Saphenous Vein: No evidence of thrombus. Normal compressibility and flow on color Doppler imaging.  Venous Reflux:  None.  Other Findings:  None.  LEFT LOWER EXTREMITY  Common Femoral Vein: No evidence of thrombus. Normal compressibility, respiratory phasicity and response to augmentation.  Saphenofemoral Junction: No evidence of thrombus. Normal compressibility and flow on color Doppler imaging.  Profunda Femoral Vein: No evidence of thrombus. Normal compressibility and flow on color Doppler imaging.  Femoral Vein: No evidence of thrombus. Normal compressibility, respiratory phasicity and response to augmentation.  Popliteal Vein: No evidence of thrombus. Normal compressibility, respiratory phasicity and response to augmentation.  Calf Veins: No evidence of thrombus. Normal compressibility and flow on color Doppler imaging.  Superficial Great Saphenous Vein: No evidence of thrombus. Normal  compressibility and flow on color Doppler imaging.  Venous Reflux:  None.  Other Findings: This is a limited exam due to patient movement and pain tolerance.  IMPRESSION: No evidence of deep venous thrombosis. Exam was limited due to patient movement and pain tolerance.   Electronically Signed   By: Marcello Moores  Register   On: 03/24/2015 08:06    Review of Systems  Cardiovascular:       Hypertension  Musculoskeletal: Positive for falls (found on bathroom floor Sunday at nursing home).  Neurological:       Chronic dementia for many years, in assistive living center for disoriented people.  Has history of Parkinson's disease.  History of syncopy   Blood pressure 111/70, pulse 80, temperature 97.9 F (36.6 C), temperature source Axillary, resp. rate 20, height 5' 1"  (1.549 m), weight 77.111 kg (170 lb), SpO2 99 %. Physical Exam  Constitutional: She appears well-developed and well-nourished.  Patient disoriented and does not respond to talking to her.  Combative at times when you touch her and often screams out.  HENT:  Head: Normocephalic and atraumatic.  Eyes: Conjunctivae are normal. Pupils are equal, round, and reactive to light.  Neck: Normal range of motion.  Cardiovascular: Normal rate, regular rhythm and intact distal pulses.   Respiratory: Effort normal.  GI: Soft.  Musculoskeletal: She exhibits tenderness (Pain of the right groin and hip area.).  Skin: Skin is warm and dry. There is erythema.  Psychiatric:  Disoriented.  Does not talk, does not respond, does not communicate other than grunts and screams.  Not aware of surroundings or people.    Assessment/Plan: Fracture of the right pelvis, superior and inferior pubic rami.  This does not need surgery.  It will take about six to eight weeks to heel.  She could have transfer to wheelchair and back with assistance.  Compression fracture of L1, age not determined.  This could be acute also.  X-rays ordered.  Complete disorientation  and non communicative patient present for years.  Gahel Safley 03/25/2015, 7:35 AM

## 2015-03-25 NOTE — Progress Notes (Addendum)
[facility] PROGRESS NOTE  [name] [name] LYY:[id number] DOB: 28-Jan-1924 DOA: 03/22/2015 PCP: Sallee Lange, MD  Brief Summary  81 year old female with history of dementia, hypertension, hyperlipidemia presented to emergency department when she was found on the floor in the bathroom of her skilled nursing facility. Apparently, the patient was on the commode, and subsequently was found on the floor. It was unclear whether she had true syncope or had a mechanical fall. In emergency department, the patient was found to have laceration of her frontal scalp which was repaired. The patient was to be discharged back to the facility, but she had 2 syncopal episodes one of which she had bowel and bladder incontinence. As result, the patient was medically for further evaluation. At baseline, the patient is wheelchair bound. She requires at least 1 person max assist for transfers. Discussion about whether the patient is able to verbalize her needs due to her advanced dementia. On the evening of 03/23/2015, the patient spiked a temperature 102.67F.  She was started on broad spectrum antibiotics and has clinically improved.  Unclear source, but suspect she may have aspirated.    Assessment/Plan  SIRS likely secondary to possible aspiration pneumonia -pt spiked temp of 102.67F on the evening of 03/23/2015 -Concern about aspiration given the patient's episodes of syncope -CT abdomen and pelvis--no acute abnormality to explain fever but did see incidentally noted pubic rami fracture -Urinalysis negative for pyuria -Blood cultures NGTD - Continue empiric vanco and zosyn -lactic acid negative, PCT 0.13 -d/c IVF -  Urine culture NGF  Acute hypoxic respiratory failure likely secondary to atelectasis and possible aspiration pneumonia -  OOB to chair if possible -  Wean oxygen as tolerated, keeping O2 sat > 88%  Pubic rami fracture -  nonoperative management according to orthopedics -  Possible acute  compression fracture of back -  XR pending  -  Appreciate orthopedics assistance  Syncope -Echocardiogram--moderate LVH with EF 65-70%, grade 1 DD, mild MR, mild TR, PA peak 48 mmHg.  No regional wall motion abnormalities -EEG--completed, report pending -Orthostatic vital signs--neg (lying and sitting only--cannot stand) - Tele:  NSR, okay to d/c telemetry  Acute on chronic renal failure (CKD3) -Likely due to volume depletion -Baseline creatinine 1.0-1.2  -Hold ARB and reevaluate in the next 24 hours--BP is also soft  Atypical chest pain -Likely due to the patient's mechanical fall -Cycle troponins--neg x 4 -EKG sinus rhythm without any concerning ST-T wave change -Continue aspirin  Dementia -Family states that the patient's mental status is back to baseline -Continue Namenda and Aricept  Lower extremity pain and edema -Venous duplex--neg for DVT  Gait instability/fall - anticipate she will return to her dementia unit  Macrocytic anemia and thrombocytopenia, hgb trending down, no obvious bleeding.   -  Iron studies, B12 972, folate -  TSH -  Occult stool -  Repeat hgb in AM -  May need blood transfusion -  May also be due to zosyn but planning to d/c in AM anyway   Diet:  Dysphagia 2  Access:  PIV IVF:  off Proph:  SCDs  Code Status: DO NOT RESUSCITATE Family Communication: patient, her daughter and granddaughter Disposition Plan: To skilled nursing facility once medically stable. Further workup for possible compression fracture and awaiting results of blood cultures.   Consultants:  Orthopedics  Procedures:  CT abdomen and pelvis  Chest x-ray  X-ray of the lumbar spine  Echo  EEG  Antibiotics:  Vancomycin from 5/2  Zosyn from 5/3  HPI/Subjective:  Smiling and nonverbal but pleasant  Objective: Filed Vitals:   03/24/15 1500 03/24/15 2237 03/25/15 0636 03/25/15 1523  BP: 109/87 131/48 111/70 122/43  Pulse: 84 79 80 79  Temp: 98.7 F  (37.1 C) 99.1 F (37.3 C) 97.9 F (36.6 C) 99.8 F (37.7 C)  TempSrc: Oral Oral Axillary Axillary  Resp:  20 20 20   Height:      Weight:      SpO2:  98% 99% 100%    Intake/Output Summary (Last 24 hours) at 03/25/15 1745 Last data filed at 03/25/15 1324  Gross per 24 hour  Intake 1236.75 ml  Output      0 ml  Net 1236.75 ml   Filed Weights   03/22/15 1625 03/23/15 0227  Weight: 77.111 kg (170 lb) 77.111 kg (170 lb)   Body mass index is 32.14 kg/(m^2).  Exam:   General:  Pleasant obese F, No acute distress  HEENT:  Laceration over right frontal area with intact staples, edges well approximated, no induration, erythema, or discharge, MMM  Cardiovascular:  RRR, nl S1, S2, faint systolic murmur at the RSB, 2+ pulses, warm extremities  Respiratory:  CTAB, no increased WOB  Abdomen:   NABS, soft, NT/ND  MSK:   Normal tone and bulk, no LEE  Neuro:  Grossly intact moves all extremities, able to follow simple commands  Data Reviewed: Basic Metabolic Panel:  Recent Labs Lab 03/22/15 2014 03/23/15 0659 03/24/15 0718 03/25/15 0742  NA 137 139 140 138  K 4.8 4.9 4.1 3.8  CL 105 108 110 108  CO2 25 24 25 24   GLUCOSE 185* 128* 138* 117*  BUN 31* 25* 26* 23*  CREATININE 1.43* 1.09* 1.42* 1.31*  CALCIUM 8.6* 8.6* 8.2* 7.3*   Liver Function Tests:  Recent Labs Lab 03/25/15 0742  AST 19  ALT 19  ALKPHOS 27*  BILITOT 0.5  PROT 5.4*  ALBUMIN 2.8*   No results for input(s): LIPASE, AMYLASE in the last 168 hours. No results for input(s): AMMONIA in the last 168 hours. CBC:  Recent Labs Lab 03/22/15 2014 03/23/15 0905 03/23/15 1430 03/24/15 0718 03/25/15 0742  WBC 15.8* 7.8  --  9.4 6.8  NEUTROABS 13.4*  --   --   --   --   HGB 10.0* 9.2*  --  8.2* 7.5*  HCT 30.9* 28.1* 26.2* 25.0* 22.9*  MCV 104.4* 103.7*  --  103.3* 104.6*  PLT 208 186  --  151 116*   Cardiac Enzymes:  Recent Labs Lab 03/22/15 2014 03/23/15 0659 03/23/15 0905 03/23/15 1430   TROPONINI 0.03 0.03 0.03 0.04*   BNP (last 3 results) No results for input(s): BNP in the last 8760 hours.  ProBNP (last 3 results) No results for input(s): PROBNP in the last 8760 hours.  CBG: No results for input(s): GLUCAP in the last 168 hours.  Recent Results (from the past 240 hour(s))  Urine culture     Status: None   Collection Time: 03/23/15  4:18 PM  Result Value Ref Range Status   Specimen Description URINE, CATHETERIZED  Final   Special Requests NONE  Final   Colony Count NO GROWTH Performed at [facility]   Final   Culture NO GROWTH Performed at [facility]   Final   Report Status 03/24/2015 FINAL  Final  Culture, blood (routine x 2)     Status: None (Preliminary result)   Collection Time: 03/24/15 12:25 AM  Result Value Ref Range Status  Specimen Description BLOOD RIGHT HAND  Final   Special Requests   Final    BOTTLES DRAWN AEROBIC AND ANAEROBIC AEB=8CC ANA=6CC   Culture NO GROWTH 1 DAY  Final   Report Status PENDING  Incomplete  Culture, blood (routine x 2)     Status: None (Preliminary result)   Collection Time: 03/24/15 12:40 AM  Result Value Ref Range Status   Specimen Description BLOOD RIGHT ANTECUBITAL  Final   Special Requests BOTTLES DRAWN AEROBIC AND ANAEROBIC 8CC EACH  Final   Culture NO GROWTH 1 DAY  Final   Report Status PENDING  Incomplete     Studies: Ct Abdomen Pelvis Wo Contrast  03/24/2015   CLINICAL DATA:  Abdominal pain and fever.  EXAM: CT ABDOMEN AND PELVIS WITHOUT CONTRAST  TECHNIQUE: Multidetector CT imaging of the abdomen and pelvis was performed following the standard protocol without IV contrast.  COMPARISON:  10/26/2014  FINDINGS: Lower chest: The lung bases demonstrate respiratory motion. There is bibasilar atelectasis. The heart is within normal limits in size. Coronary artery calcifications are noted. There is a large hiatal hernia.  Hepatobiliary: No focal hepatic lesions or intrahepatic biliary dilatation.  The gallbladder is surgically absent. No significant common bile duct dilatation.  Pancreas: Diffuse fatty change but no inflammation or mass.  Spleen: Normal.  Adrenals/Urinary Tract: A right renal calculus is noted. Stable prominent extra renal pelves bilaterally. No obstructing ureteral calculi or bladder calculi. There is a stable complex cyst associated with the right kidney with multiple thin septations and calcifications.  Stomach/Bowel: The stomach, duodenum, small bowel and colon are grossly normal. No inflammatory changes, mass lesions or obstructive findings.  Vascular/Lymphatic: No mesenteric or retroperitoneal mass or adenopathy. The aorta is normal in caliber. Stable atherosclerotic calcifications.  Other: No pelvic mass or adenopathy. The bladder appears normal. No inguinal mass or adenopathy.  Musculoskeletal: There are superior and inferior pubic rami fractures on the right side which appear to be acute. No hip fracture. No definite sacral fractures. There is a the stable L1 compression fracture.  IMPRESSION: 1. No acute abdominal/pelvic findings. 2. Stable complex cystic lesion associated with the right kidney. 3. Large hiatal hernia. 4. Acute right-sided pubic rami fractures.   Electronically Signed   By: Marijo Sanes M.D.   On: 03/24/2015 14:38    Scheduled Meds: . aspirin EC  81 mg Oral Daily  . citalopram  20 mg Oral Daily  . donepezil  5 mg Oral QHS  . feeding supplement (RESOURCE BREEZE)  1 Container Oral Q24H  . memantine  10 mg Oral BID  . pantoprazole  40 mg Oral Daily  . piperacillin-tazobactam (ZOSYN)  IV  3.375 g Intravenous 3 times per day  . polyvinyl alcohol  1 drop Both Eyes TID  . primidone  25 mg Oral QHS  . psyllium  1 packet Oral QHS  . sodium chloride  3 mL Intravenous Q12H  . vancomycin  750 mg Intravenous Q24H   Continuous Infusions: . sodium chloride Stopped (03/23/15 2027)  . sodium chloride 75 mL/hr at 03/25/15 0435    Principal Problem:    Fall Active Problems:   PARKINSON'S DISEASE   Essential hypertension   Frequent falls   Syncope and collapse   Atypical chest pain   Scalp laceration   Faintness   Fever presenting with conditions classified elsewhere   Acute renal failure superimposed on stage 3 chronic kidney disease   Closed head injury    Time spent: 30 min  Janece Canterbury  [facility] Pager [phone]. If 7PM-7AM, please contact night-coverage at [email or web address], password [facility] 03/25/2015, 5:45 PM  LOS: 1 day

## 2015-03-25 NOTE — Clinical Documentation Improvement (Signed)
  Patient fall at SNF brought to ED then had syncope/LOC in ED. CT showed FX Superior and Inferior pubic rami and L1 vertebral Fx. Is also being treated for "concern for aspiration during syncope" with IV Vanc and Rocephin. Please clarify if multiple FXs are likely/suspected to be POA from SNF vs occurred in ED.    ALSO please clarify diagnosis being treated with IVABX as the term "aspiration" is considered nonspecific.  Thank you,  Ezekiel Ina ,RN Clinical Documentation Specialist:  [phone]  [place] Information Management

## 2015-03-25 NOTE — Progress Notes (Signed)
EEG Completed; Results Pending  

## 2015-03-25 NOTE — Clinical Social Work Note (Signed)
CSW discussed pt with Tammy at [facility]. Tammy plans to assess pt in AM to determine if higher level of care is needed.  Benay Pike, [place]

## 2015-03-26 DIAGNOSIS — W19XXXD Unspecified fall, subsequent encounter: Secondary | ICD-10-CM

## 2015-03-26 DIAGNOSIS — R296 Repeated falls: Secondary | ICD-10-CM

## 2015-03-26 DIAGNOSIS — D509 Iron deficiency anemia, unspecified: Secondary | ICD-10-CM

## 2015-03-26 DIAGNOSIS — J690 Pneumonitis due to inhalation of food and vomit: Secondary | ICD-10-CM

## 2015-03-26 LAB — CBC
HEMATOCRIT: 21.7 % — AB (ref 36.0–46.0)
HEMOGLOBIN: 7.1 g/dL — AB (ref 12.0–15.0)
MCH: 33.8 pg (ref 26.0–34.0)
MCHC: 32.7 g/dL (ref 30.0–36.0)
MCV: 103.3 fL — AB (ref 78.0–100.0)
Platelets: 141 10*3/uL — ABNORMAL LOW (ref 150–400)
RBC: 2.1 MIL/uL — AB (ref 3.87–5.11)
RDW: 13.5 % (ref 11.5–15.5)
WBC: 5.9 10*3/uL (ref 4.0–10.5)

## 2015-03-26 LAB — OCCULT BLOOD X 1 CARD TO LAB, STOOL: Fecal Occult Bld: NEGATIVE

## 2015-03-26 LAB — IRON AND TIBC
Iron: 20 ug/dL — ABNORMAL LOW (ref 28–170)
Saturation Ratios: 9 % — ABNORMAL LOW (ref 10.4–31.8)
TIBC: 230 ug/dL — ABNORMAL LOW (ref 250–450)
UIBC: 210 ug/dL

## 2015-03-26 LAB — PREPARE RBC (CROSSMATCH)

## 2015-03-26 LAB — BASIC METABOLIC PANEL
ANION GAP: 5 (ref 5–15)
BUN: 20 mg/dL (ref 6–20)
CO2: 24 mmol/L (ref 22–32)
CREATININE: 1.18 mg/dL — AB (ref 0.44–1.00)
Calcium: 7.6 mg/dL — ABNORMAL LOW (ref 8.9–10.3)
Chloride: 110 mmol/L (ref 101–111)
GFR calc Af Amer: 46 mL/min — ABNORMAL LOW (ref >60)
GFR calc non Af Amer: 40 mL/min — ABNORMAL LOW (ref >60)
Glucose, Bld: 114 mg/dL — ABNORMAL HIGH (ref 70–99)
Potassium: 4 mmol/L (ref 3.5–5.1)
Sodium: 139 mmol/L (ref 135–145)

## 2015-03-26 LAB — ABO/RH: ABO/RH(D): A NEG

## 2015-03-26 LAB — TSH: TSH: 2.117 u[IU]/mL (ref 0.350–4.500)

## 2015-03-26 LAB — PROCALCITONIN

## 2015-03-26 LAB — FERRITIN: FERRITIN: 103 ng/mL (ref 11–307)

## 2015-03-26 MED ORDER — KETOROLAC TROMETHAMINE 15 MG/ML IJ SOLN
15.0000 mg | Freq: Once | INTRAMUSCULAR | Status: AC
  Administered 2015-03-26: 15 mg via INTRAVENOUS
  Filled 2015-03-26: qty 1

## 2015-03-26 MED ORDER — AMOXICILLIN-POT CLAVULANATE 500-125 MG PO TABS
1.0000 | ORAL_TABLET | Freq: Two times a day (BID) | ORAL | Status: DC
  Administered 2015-03-26 – 2015-03-28 (×5): 500 mg via ORAL
  Filled 2015-03-26 (×5): qty 1

## 2015-03-26 MED ORDER — DIAZEPAM 5 MG/ML IJ SOLN
2.5000 mg | Freq: Once | INTRAMUSCULAR | Status: AC
  Administered 2015-03-26: 2.5 mg via INTRAVENOUS
  Filled 2015-03-26: qty 2

## 2015-03-26 MED ORDER — ALBUTEROL SULFATE (2.5 MG/3ML) 0.083% IN NEBU
5.0000 mg | INHALATION_SOLUTION | Freq: Once | RESPIRATORY_TRACT | Status: AC
  Administered 2015-03-26: 5 mg via RESPIRATORY_TRACT
  Filled 2015-03-26: qty 6

## 2015-03-26 MED ORDER — SODIUM CHLORIDE 0.9 % IV SOLN
Freq: Once | INTRAVENOUS | Status: AC
  Administered 2015-03-26: 14:00:00 via INTRAVENOUS

## 2015-03-26 MED ORDER — AMOXICILLIN-POT CLAVULANATE 875-125 MG PO TABS: 1.0000 | ORAL_TABLET | Freq: Two times a day (BID) | ORAL | Status: DC

## 2015-03-26 NOTE — Progress Notes (Signed)
Blood transfusion started on pt around 1720. Pt is scheduled to get 2 units. No adverse reactions thus far. Will continue to monitor

## 2015-03-26 NOTE — Procedures (Signed)
Blanca A. Merlene Laughter, MD     [email or web address]           HISTORY: The patient 81 year old who was found unresponsive on the floor. She is suspected of having a syncopal episode or seizure. She had previous episodes of syncope in the past.  MEDICATIONS: Scheduled Meds: Continuous Infusions: PRN Meds:.  Prior to Admission medications   Medication Sig Start Date End Date Taking? Authorizing Provider  ASPIRIN LOW DOSE 81 MG EC tablet TAKE 1 TABLET BY MOUTH ONCE DAILY. 12/25/14   Kathyrn Drown, MD  B Complex-C (B-COMPLEX WITH VITAMIN C) tablet TAKE 1 TABLET BY MOUTH ONCE DAILY. 11/12/14   Kathyrn Drown, MD  BENICAR 20 MG tablet TAKE 1 TABLET BY MOUTH ONCE DAILY. 01/26/15   Kathyrn Drown, MD  bisacodyl (DULCOLAX) 10 MG suppository Use every other day as needed for constipation Patient taking differently: Place 10 mg rectally daily as needed for mild constipation. Use every other day as needed for constipation 01/06/15   Rogene Houston, MD  citalopram (CELEXA) 20 MG tablet TAKE 1 TABLET BY MOUTH ONCE DAILY.(FOR DEPRESSION) 02/10/15   Kathyrn Drown, MD  donepezil (ARICEPT) 5 MG tablet TAKE 1 TABLET BY MOUTH EACH MORNING. 02/17/15   Kathyrn Drown, MD  guaifenesin (ROBITUSSIN) 100 MG/5ML syrup Take 200 mg by mouth every 6 (six) hours as needed for cough or congestion.    Historical Provider, MD  ketoconazole (NIZORAL) 2 % cream Apply BID to rash under rash is gone 01/28/15   Kathyrn Drown, MD  loperamide (IMODIUM) 2 MG capsule Take 2 mg by mouth 2 (two) times daily as needed for diarrhea or loose stools.  09/12/14   Historical Provider, MD  loratadine (CLARITIN) 10 MG tablet TAKE 1 TABLET BY MOUTH ONCE DAILY.(FOR ALLERGIC RHINITIS) 02/03/15   Kathyrn Drown, MD  memantine (NAMENDA) 10 MG tablet TAKE 1 TABLET BY MOUTH TWICE DAILY. 12/15/14   Kathyrn Drown, MD  omeprazole (PRILOSEC) 20 MG capsule TAKE 1 CAPSULE BY MOUTH TWICE DAILY. 10/27/14   Rogene Houston, MD  ondansetron  (ZOFRAN-ODT) 4 MG disintegrating tablet DISSOLVE 1 TABLET IN MOOUTH EVERY 6 HOURS AS NEEDED FOR NAUSEA. 12/30/14   Kathyrn Drown, MD  polyethylene glycol powder (GLYCOLAX/MIRALAX) powder MIX 1 CAPFUL (17G) IN 8 OUNCES OF JUICE/WATER AND DRINK ONCE DAILY. 05/21/14   Kathyrn Drown, MD  polyvinyl alcohol (LIQUIFILM TEARS) 1.4 % ophthalmic solution Place 1 drop into both eyes 3 (three) times daily.    Historical Provider, MD  primidone (MYSOLINE) 50 MG tablet TAKE 1/2 TABLET (25mg ) BY MOUTH AT BEDTIME. 01/14/15   Mikey Kirschner, MD  Q-PAP 500 MG tablet TAKE 1 TABLET BY MOUTH TWICE DAILY. Patient taking differently: TAKE 1 TABLET BY MOUTH THREE TIMES DAILY 01/20/15   Kathyrn Drown, MD  traMADol (ULTRAM) 50 MG tablet Take 1 tablet (50 mg total) by mouth every 6 (six) hours as needed for moderate pain. 03/26/14   Irine Seal, MD  Wheat Dextrin (BENEFIBER DRINK MIX) PACK Take 4 g by mouth at bedtime. Patient taking differently: Take 4 g by mouth at bedtime. MIX ONE TEASPOONFUL IN 8 OUNCES WATER/JUICE 01/06/15   Rogene Houston, MD      ANALYSIS: A 16 channel recording using standard 10 20 measurements is conducted for 22 minutes. The background activity gases high as 6 Hz but mostly she remains in the 4 range delta activity throughout most of the recording. There  is beta activity observed in the frontal areas. Awake and drowsy activities are observed. The patient had multiple type of movement disorders throughout the recording with shaking, tremors and jerking along with automatism. None of these movements are associated with abnormal electrographic activity. Photic simulation and hyperventilation were not carried out. No focal or lateral slowing is observed. There are no epileptiform activities observed.   IMPRESSION: 1. This recording shows moderate global slowing indicating a moderate global encephalopathy. There are no epileptiform discharges observed. Multiple type of movements are observed without  electrographic correlates.      Shenita Trego A. Merlene Laughter, M.D.  Diplomate, Tax adviser of Psychiatry and Neurology ( Neurology).

## 2015-03-26 NOTE — Clinical Social Work Note (Signed)
Pt is not stable for d/c due to anemia. Will receive transfusion this afternoon. Discussed with [name] again. Awaiting bed offers. Family prefers [place], [place], or [place] and these facilities were called.  [name] [name], [place]

## 2015-03-26 NOTE — Clinical Documentation Improvement (Signed)
  Please clarify if pelvic fractures were POA.  Thank you,  Ezekiel Ina ,RN Clinical Documentation Specialist:  [phone]  [place] Information Management

## 2015-03-26 NOTE — Clinical Social Work Placement (Signed)
   CLINICAL SOCIAL WORK PLACEMENT  NOTE  Date:  03/26/2015  Patient Details  Name: [name] [name] MRN: [id number] Date of Birth: 1924-08-13  Clinical Social Work is seeking post-discharge placement for this patient at the [place] level of care (*CSW will initial, date and re-position this form in  chart as items are completed):  Yes   Patient/family provided with [place] Work Department's list of facilities offering this level of care within the geographic area requested by the patient (or if unable, by the patient's family).  Yes   Patient/family informed of their freedom to choose among providers that offer the needed level of care, that participate in Medicare, Medicaid or managed care program needed by the patient, have an available bed and are willing to accept the patient.  Yes   Patient/family informed of [facility]'s ownership interest in [facility] and [facility], as well as of the fact that they are under no obligation to receive care at these facilities.  PASRR submitted to EDS on 03/26/15     PASRR number received on 03/26/15     Existing PASRR number confirmed on       FL2 transmitted to all facilities in geographic area requested by pt/family on 03/26/15     FL2 transmitted to all facilities within larger geographic area on 03/26/15     Patient informed that his/her managed care company has contracts with or will negotiate with certain facilities, including the following:            Patient/family informed of bed offers received.  Patient chooses bed at       Physician recommends and patient chooses bed at      Patient to be transferred to   on  .  Patient to be transferred to facility by       Patient family notified on   of transfer.  Name of family member notified:        PHYSICIAN       Additional Comment:    _______________________________________________ Salome Arnt, [place] 03/26/2015,  10:40 AM [phone]

## 2015-03-26 NOTE — Progress Notes (Signed)
PT Cancellation Note  Patient Details Name: [name] [name] MRN: [id number] DOB: Apr 02, 1924   Cancelled Treatment:    Reason Eval/Treat Not Completed: Other (comment).  Pt has now been diagnosed with a pelvic fracture.  Pt has been seen twice already by PT and is found to have NO rehab potential.  Transfers PTA have been very difficult at baseline due to severe cognitive deficit.  Now, that we know transfers will be painful, transfers will be extremely difficult and potentially dangerous for pt and staff.  In my opinion, the safest way to transfer this pt from bed to chair will be with a lift until her pain subsides.  I have spoken with her daughter Henrietta Dine) about this and she is in agreement.  I have discussed the plan with CNA and he will plan to get pt up to a chair today with the lift.   Demetrios Isaacs L 03/26/2015, 9:27 AM

## 2015-03-26 NOTE — Clinical Social Work Note (Signed)
CSW met with pt's daughter/HCPOA [name] at bedside. CSW discussed pt further with [facility] and they feel pt's needs will exceed ALF. [name] states she was afraid of this, but appears understanding. CSW discussed placement at nursing facility as Medicaid only as pt is ready today per MD. [name] is agreeable to bed search in [place] and [place] counties. SNF list provided. Will follow up.   Benay Pike, [place]

## 2015-03-26 NOTE — Progress Notes (Signed)
Subjective: She remains confused in bed.  Daughter present   Objective: Vital signs in last 24 hours: Temp:  [98.8 F (37.1 C)-99.8 F (37.7 C)] 98.8 F (37.1 C) (05/05 0456) Pulse Rate:  [74-83] 74 (05/05 0456) Resp:  [20] 20 (05/05 0456) BP: (122-126)/(43-57) 126/51 mmHg (05/05 0456) SpO2:  [92 %-100 %] 100 % (05/05 0456)  Intake/Output from previous day: 05/04 0701 - 05/05 0700 In: 1962.3 [P.O.:600; I.V.:1012.3; IV Piggyback:350] Out: -  Intake/Output this shift:     Recent Labs  03/23/15 0905 03/24/15 0718 03/25/15 0742  HGB 9.2* 8.2* 7.5*    Recent Labs  03/24/15 0718 03/25/15 0742  WBC 9.4 6.8  RBC 2.42* 2.19*  HCT 25.0* 22.9*  PLT 151 116*    Recent Labs  03/24/15 0718 03/25/15 0742  NA 140 138  K 4.1 3.8  CL 110 108  CO2 25 24  BUN 26* 23*  CREATININE 1.42* 1.31*  GLUCOSE 138* 117*  CALCIUM 8.2* 7.3*   No results for input(s): LABPT, INR in the last 72 hours.  She is unchanged.  The radiologist contacted me and said the compression fracture of L1 is old and they have films from several years ago showing it.  It is stable.  New specific x-rays of the area were not done.  Assessment/Plan: Fracture pelvis, right pubic area.  Will try to do transfers to chair. Chronic confusion.   [name] [name] 03/26/2015, 7:51 AM

## 2015-03-26 NOTE — Progress Notes (Signed)
[facility] PROGRESS NOTE  [name] [name] IRS:[id number] DOB: 10-Sep-1924 DOA: 03/22/2015 PCP: Sallee Lange, MD  Brief Summary  81 year old female with history of dementia, hypertension, hyperlipidemia presented to emergency department when she was found on the floor in the bathroom of her skilled nursing facility. Apparently, the patient was on the commode, and subsequently was found on the floor. It was unclear whether she had true syncope or had a mechanical fall. In emergency department, the patient was found to have laceration of her frontal scalp which was repaired. The patient was to be discharged back to the facility, but she had 2 syncopal episodes one of which she had bowel and bladder incontinence. As result, the patient was medically for further evaluation. At baseline, the patient is wheelchair bound. She requires at least 1 person max assist for transfers. Discussion about whether the patient is able to verbalize her needs due to her advanced dementia. On the evening of 03/23/2015, the patient spiked a temperature 102.32F.  She was started on broad spectrum antibiotics and has clinically improved.  Unclear source, but suspect she may have aspirated.    Assessment/Plan  SIRS likely secondary to possible aspiration pneumonia -pt spiked temp of 102.32F on the evening of 03/23/2015 -Concern about aspiration given the patient's episodes of syncope -CT abdomen and pelvis--no acute abnormality to explain fever but did see incidentally noted pubic rami fracture -Urinalysis negative for pyuria -Blood cultures NGTD - d/c empiric vanco and zosyn - Start augmentin to complete a 7 day course, currently day 3 - lactic acid negative, PCT <0.1 -  Urine culture NGF  Acute hypoxic respiratory failure likely secondary to atelectasis vs. Aspiration pneumonia -  OOB to chair if possible -  Wean oxygen as tolerated, keeping O2 sat > 88%  Pubic rami fracture -  nonoperative management  according to orthopedics -  Possible acute compression fracture of back -  XR lumbar spine:  Chronic fracture of L1, unchanged from prior -  Appreciate orthopedics assistance  Syncope -Echocardiogram--moderate LVH with EF 65-70%, grade 1 DD, mild MR, mild TR, PA peak 48 mmHg.  No regional wall motion abnormalities -EEG--completed, report pending -Orthostatic vital signs--neg (lying and sitting only--cannot stand) - Tele:  NSR, okay to d/c telemetry  Acute on chronic renal failure (CKD3) -Likely due to volume depletion -Baseline creatinine 1.0-1.2  -Hold ARB and reevaluate in the next 24 hours--BP is also soft  Atypical chest pain -Likely due to the patient's mechanical fall -Cycle troponins--neg x 4 -EKG sinus rhythm without any concerning ST-T wave change -Continue aspirin  Dementia -Family states that the patient's mental status is back to baseline -Continue Namenda and Aricept  Lower extremity pain and edema -Venous duplex--neg for DVT  Gait instability/fall -  Dementia unit refusing to take her back and due to her dementia, she is not a rehabilitation candidate.=  Macrocytic anemia and thrombocytopenia, hgb trending down, no obvious bleeding.  LFTs and bilirubin normal suggesting this is not hemolysis.  Thrombocytopenia resolving -  Transfuse 2 unit PRBC -  Iron studies cw iron deficiency, B12 972, folate RBC wnl -  TSH 2.117 -  Occult stool pending -  Repeat hgb in AM -  May need blood transfusion -  D/c zosyn -  If occult stool negative may need CT scan to eval for retroperitoneal bleed -  Start iron supplements at discharge  Diet:  Dysphagia 2  Access:  PIV IVF:  off Proph:  SCDs  Code Status: DO NOT RESUSCITATE  Family Communication: patient, her daughter and granddaughter Disposition Plan: To skilled nursing facility possibly tomorrow.  Work up for acute anemia underway  Consultants:  Orthopedics  Procedures:  CT abdomen and pelvis  Chest  x-ray  X-ray of the lumbar spine  Echo  EEG  Antibiotics:  Vancomycin from 5/2  Zosyn from 5/3   HPI/Subjective:  Smiling and nonverbal but pleasant.  Had a good night according to family  Objective: Filed Vitals:   03/25/15 0636 03/25/15 1523 03/25/15 2138 03/26/15 0456  BP: 111/70 122/43 124/57 126/51  Pulse: 80 79 83 74  Temp: 97.9 F (36.6 C) 99.8 F (37.7 C) 98.9 F (37.2 C) 98.8 F (37.1 C)  TempSrc: Axillary Axillary Axillary Oral  Resp: 20 20 20 20   Height:      Weight:      SpO2: 99% 100% 92% 100%    Intake/Output Summary (Last 24 hours) at 03/26/15 1428 Last data filed at 03/26/15 1100  Gross per 24 hour  Intake 1962.25 ml  Output      0 ml  Net 1962.25 ml   Filed Weights   03/22/15 1625 03/23/15 0227  Weight: 77.111 kg (170 lb) 77.111 kg (170 lb)   Body mass index is 32.14 kg/(m^2).  Exam:   General:  Pleasant obese F, No acute distress  HEENT:  Laceration over right frontal area with intact staples, edges well approximated, no induration, erythema, or discharge, MMM  Cardiovascular:  RRR, nl S1, S2,systolic murmur at the RSB, 2+ pulses, warm extremities  Respiratory:  Wheezing (sounds like upper airway), no focal rales or rhonchi, no increased WOB  Abdomen:   NABS, soft, NT/ND  MSK:   Normal tone and bulk, no LEE  Neuro:  Grossly intact moves all extremities, able to follow simple commands  Data Reviewed: Basic Metabolic Panel:  Recent Labs Lab 03/22/15 2014 03/23/15 0659 03/24/15 0718 03/25/15 0742 03/26/15 0708  NA 137 139 140 138 139  K 4.8 4.9 4.1 3.8 4.0  CL 105 108 110 108 110  CO2 25 24 25 24 24   GLUCOSE 185* 128* 138* 117* 114*  BUN 31* 25* 26* 23* 20  CREATININE 1.43* 1.09* 1.42* 1.31* 1.18*  CALCIUM 8.6* 8.6* 8.2* 7.3* 7.6*   Liver Function Tests:  Recent Labs Lab 03/25/15 0742  AST 19  ALT 19  ALKPHOS 27*  BILITOT 0.5  PROT 5.4*  ALBUMIN 2.8*   No results for input(s): LIPASE, AMYLASE in the last  168 hours. No results for input(s): AMMONIA in the last 168 hours. CBC:  Recent Labs Lab 03/22/15 2014 03/23/15 0905 03/23/15 1430 03/24/15 0718 03/25/15 0742 03/26/15 0708  WBC 15.8* 7.8  --  9.4 6.8 5.9  NEUTROABS 13.4*  --   --   --   --   --   HGB 10.0* 9.2*  --  8.2* 7.5* 7.1*  HCT 30.9* 28.1* 26.2* 25.0* 22.9* 21.7*  MCV 104.4* 103.7*  --  103.3* 104.6* 103.3*  PLT 208 186  --  151 116* 141*   Cardiac Enzymes:  Recent Labs Lab 03/22/15 2014 03/23/15 0659 03/23/15 0905 03/23/15 1430  TROPONINI 0.03 0.03 0.03 0.04*   BNP (last 3 results) No results for input(s): BNP in the last 8760 hours.  ProBNP (last 3 results) No results for input(s): PROBNP in the last 8760 hours.  CBG: No results for input(s): GLUCAP in the last 168 hours.  Recent Results (from the past 240 hour(s))  Urine culture  Status: None   Collection Time: 03/23/15  4:18 PM  Result Value Ref Range Status   Specimen Description URINE, CATHETERIZED  Final   Special Requests NONE  Final   Colony Count NO GROWTH Performed at [facility]   Final   Culture NO GROWTH Performed at [facility]   Final   Report Status 03/24/2015 FINAL  Final  Culture, blood (routine x 2)     Status: None (Preliminary result)   Collection Time: 03/24/15 12:25 AM  Result Value Ref Range Status   Specimen Description BLOOD RIGHT HAND  Final   Special Requests   Final    BOTTLES DRAWN AEROBIC AND ANAEROBIC AEB=8CC ANA=6CC   Culture NO GROWTH 2 DAYS  Final   Report Status PENDING  Incomplete  Culture, blood (routine x 2)     Status: None (Preliminary result)   Collection Time: 03/24/15 12:40 AM  Result Value Ref Range Status   Specimen Description BLOOD RIGHT ANTECUBITAL  Final   Special Requests BOTTLES DRAWN AEROBIC AND ANAEROBIC 8CC EACH  Final   Culture NO GROWTH 2 DAYS  Final   Report Status PENDING  Incomplete     Studies: Dg Lumbar Spine 2-3 Views  03/25/2015   CLINICAL DATA:  Recent  fall. Found down. Altered mental status with history of dementia.  EXAM: LUMBAR SPINE - 2-3 VIEW  COMPARISON:  Abdominal pelvic CTs 03/24/2015 and 10/26/2014.  FINDINGS: The bones are diffusely demineralized. Bone detail is mildly limited by contrast material within the colon from the recent CT. There are 5 lumbar type vertebral bodies. Greater than 50% compression deformity at L1 is unchanged from the CT of 5 months ago. No acute lumbar spine fractures are identified. The alignment is stable and near anatomic. As seen on recent CT, there are acute appearing fractures of the right superior and inferior pubic rami.  IMPRESSION: No acute lumbar spine findings demonstrated. Stable L1 compression deformity.  Acute mildly displaced fractures of the right superior and inferior pubic rami.   Electronically Signed   By: Richardean Sale M.D.   On: 03/25/2015 19:24    Scheduled Meds: . sodium chloride   Intravenous Once  . aspirin EC  81 mg Oral Daily  . citalopram  20 mg Oral Daily  . donepezil  5 mg Oral QHS  . feeding supplement (RESOURCE BREEZE)  1 Container Oral Q24H  . memantine  10 mg Oral BID  . pantoprazole  40 mg Oral Daily  . polyvinyl alcohol  1 drop Both Eyes TID  . primidone  25 mg Oral QHS  . psyllium  1 packet Oral QHS  . sodium chloride  3 mL Intravenous Q12H   Continuous Infusions:    Principal Problem:   Fall Active Problems:   PARKINSON'S DISEASE   Essential hypertension   Frequent falls   Syncope and collapse   Atypical chest pain   Scalp laceration   Faintness   Fever presenting with conditions classified elsewhere   Acute renal failure superimposed on stage 3 chronic kidney disease   Closed head injury    Time spent: 30 min    Smera Guyette, [place] Pager [phone]. If 7PM-7AM, please contact night-coverage at [email or web address], password [facility] 03/26/2015, 2:28 PM  LOS: 2 days

## 2015-03-27 ENCOUNTER — Ambulatory Visit: Payer: Medicare Other | Admitting: Family Medicine

## 2015-03-27 DIAGNOSIS — J690 Pneumonitis due to inhalation of food and vomit: Secondary | ICD-10-CM

## 2015-03-27 LAB — TYPE AND SCREEN
ABO/RH(D): A NEG
Antibody Screen: NEGATIVE
UNIT DIVISION: 0
UNIT DIVISION: 0

## 2015-03-27 LAB — BASIC METABOLIC PANEL
Anion gap: 4 — ABNORMAL LOW (ref 5–15)
BUN: 17 mg/dL (ref 6–20)
CALCIUM: 7.8 mg/dL — AB (ref 8.9–10.3)
CO2: 25 mmol/L (ref 22–32)
Chloride: 111 mmol/L (ref 101–111)
Creatinine, Ser: 1.16 mg/dL — ABNORMAL HIGH (ref 0.44–1.00)
GFR calc Af Amer: 47 mL/min — ABNORMAL LOW (ref >60)
GFR, EST NON AFRICAN AMERICAN: 41 mL/min — AB (ref >60)
GLUCOSE: 118 mg/dL — AB (ref 70–99)
POTASSIUM: 4.1 mmol/L (ref 3.5–5.1)
SODIUM: 140 mmol/L (ref 135–145)

## 2015-03-27 LAB — CBC
HEMATOCRIT: 27.8 % — AB (ref 36.0–46.0)
Hemoglobin: 9.4 g/dL — ABNORMAL LOW (ref 12.0–15.0)
MCH: 32.6 pg (ref 26.0–34.0)
MCHC: 33.8 g/dL (ref 30.0–36.0)
MCV: 96.5 fL (ref 78.0–100.0)
Platelets: 145 10*3/uL — ABNORMAL LOW (ref 150–400)
RBC: 2.88 MIL/uL — ABNORMAL LOW (ref 3.87–5.11)
RDW: 18.2 % — AB (ref 11.5–15.5)
WBC: 6.3 10*3/uL (ref 4.0–10.5)

## 2015-03-27 MED ORDER — THERA VITAL M PO TABS: 1.0000 | ORAL_TABLET | Freq: Every day | ORAL | Status: DC

## 2015-03-27 MED ORDER — AMOXICILLIN-POT CLAVULANATE 500-125 MG PO TABS: 1.0000 | ORAL_TABLET | Freq: Two times a day (BID) | ORAL | Status: DC

## 2015-03-27 MED ORDER — BOOST / RESOURCE BREEZE PO LIQD: 1.0000 | ORAL | Status: AC

## 2015-03-27 MED ORDER — POLYETHYLENE GLYCOL 3350 17 GM/SCOOP PO POWD: ORAL | Status: DC

## 2015-03-27 MED ORDER — FUROSEMIDE 10 MG/ML IJ SOLN
20.0000 mg | Freq: Once | INTRAMUSCULAR | Status: AC
  Administered 2015-03-27: 20 mg via INTRAVENOUS
  Filled 2015-03-27: qty 2

## 2015-03-27 MED ORDER — ACETAMINOPHEN 500 MG PO TABS: 500.0000 mg | ORAL_TABLET | Freq: Four times a day (QID) | ORAL | Status: DC | PRN

## 2015-03-27 NOTE — Progress Notes (Addendum)
[facility] PROGRESS NOTE  [name] [name] KWI:[id number] DOB: 1924/01/15 DOA: 03/22/2015 PCP: Sallee Lange, MD  Brief Summary  81 year old female with history of dementia, hypertension, hyperlipidemia presented to emergency department when she was found on the floor in the bathroom of her skilled nursing facility. Apparently, the patient was on the commode, and subsequently was found on the floor. It was unclear whether she had true syncope or had a mechanical fall. In emergency department, the patient was found to have laceration of her frontal scalp which was repaired. The patient was to be discharged back to the facility, but she had 2 syncopal episodes one of which she had bowel and bladder incontinence. As result, the patient was medically for further evaluation. At baseline, the patient is wheelchair bound. She requires at least 1 person max assist for transfers. Discussion about whether the patient is able to verbalize her needs due to her advanced dementia. On the evening of 03/23/2015, the patient spiked a temperature 102.108F.  She was started on broad spectrum antibiotics and has clinically improved.  Unclear source, but suspect she may have aspirated.    Assessment/Plan  Syncope, may have been due to mild volume depletion and generalized weakness superimposed on right hemiparesis from previous stroke.  Orthostatic vital signs were negative, but they were only able to be completed lying and sitting because she is unable to stand.   Telemetry remained NSR, no dangerous arrhythmias.  Echocardiogram demonstrated moderate LVH with EF 65-70%, grade 1 DD, mild MR, mild TR, PA peak 48 mmHg.  No regional wall motion abnormalities.  EEG was performed and demonstrated diffuse slowing, but no epileptiform activity and she did not have shaking suggestive of seizure during her witnessed events in the ER.  She had some progressive anemia, but her occult stool was negative.  She was transfused 2 units  of blood, hydrated and her episodes resolved.   -  Staples placed on right frontal scalp will need to be removed on Monday 5/9.  SIRS likely secondary to possible aspiration pneumonia -pt spiked temp of 102.108F on the evening of 03/23/2015 which was concerning for aspiration given her previous episodes of syncope.  -CT abdomen and pelvis--no acute abnormality to explain fever but did see incidentally noted pubic rami fracture -Urinalysis negative for pyuria -Blood cultures NGTD - She was started on empiric vanco and zosyn but transitioned to augmentin on 5/5 to complete a 7 day course for aspiration pneumonia, last day on 03/30/2015 - lactic acid negative, PCT <0.1 -  Urine culture NGF  Acute hypoxic respiratory failure likely secondary to atelectasis vs. Aspiration pneumonia, initially improved, but then worsened overnight on 5/5-5/6, likely due to some acute on chronic diastolic heart failure after IVF and blood transfusions. -  IV lasix once -  Unable to record strict I/o due to incontinence  Pubic rami fracture found incidentally on CT abd/pelvis.  Orthopedics was consulted and recommended nonoperative management according to orthopedics.  She also had some follow up X-rays of her lumbar spine to make sure her compression fracture of L1 appeared stable, which is was.  Weight bearing as tolerated.  She is safe to perform transfers with assistance.    Acute on chronic renal failure (CKD3), Baseline creatinine 1.0-1.2, peak of 1.42 and trended down to baseline with IVF and holding ARB.  Essential hypertension, mild.  Stopped her ARB and allowed her BP to run a little higher (150s).  I am hopeful that making this change will help with her  syncope and reduce her pill burden.  Also, she had some AKI.  Review BP again in a week and consider resuming a blood pressure medication if markedly elevated.    Atypical chest pain, likely due to the patient's mechanical fall.  Troponins were negative and she  had no concerning ST segment changes.    Dementia, baseline mentation.  Continued Namenda and Aricept  Lower extremity pain and edema, venous duplex was neg for DVT  Gait instability/fall, due to her dementia, she is not a good rehabilitation candidate.  Macrocytic anemia and thrombocytopenia, hgb trended down with IVF to 7.1, no obvious bleeding.  LFTs and bilirubin normal suggesting this was not hemolysis.  Thrombocytopenia resolved so not likely DIC.  May have been related to IVF and zosyn.   -  Transfused 2 unit PRBC on 5/5 with appropriate response of hemoglobin -  Iron studies cw iron deficiency, B12 972, folate RBC wnl, TSH 2.117 -  Occult stool negative  Diet:  Dysphagia 2  Access:  PIV IVF:  off Proph:  SCDs  Code Status: DO NOT RESUSCITATE Family Communication: patient, her daughter and granddaughter Disposition Plan: To skilled nursing facility possibly tomorrow.  Work up for acute anemia underway  Consultants:  Orthopedics  Procedures:  CT abdomen and pelvis  Chest x-ray  X-ray of the lumbar spine  Echo  EEG  Antibiotics:  Vancomycin from 5/2 > 5/5  Zosyn from 5/3  > 5/5  Augmentin 5/5 >  HPI/Subjective:  Dyspneic and agitated overnight.  Received a duoneb without much relief.  Required sedating medications last night to calm her and she is now sleeping soundly this morning.  Minimal uop since early morning and still wheezing.  Objective: Filed Vitals:   03/26/15 2231 03/26/15 2330 03/27/15 0000 03/27/15 0500  BP: 160/92 158/82 164/88 149/81  Pulse: 86 78 71 76  Temp: 98.3 F (36.8 C) 98.5 F (36.9 C) 98.6 F (37 C) 98.3 F (36.8 C)  TempSrc: Oral Oral Oral Oral  Resp: 20 18 20 18   Height:      Weight:      SpO2: 92% 94%  94%    Intake/Output Summary (Last 24 hours) at 03/27/15 1332 Last data filed at 03/26/15 2045  Gross per 24 hour  Intake    670 ml  Output      0 ml  Net    670 ml   Filed Weights   03/22/15 1625 03/23/15 0227   Weight: 77.111 kg (170 lb) 77.111 kg (170 lb)   Body mass index is 32.14 kg/(m^2).  Exam:   General:  Pleasant obese F, No acute distress  HEENT:  Laceration over right frontal area with intact staples, edges well approximated, no induration, erythema, or discharge, MMM  Cardiovascular:  RRR, nl S1, S2,systolic murmur at the RSB, 2+ pulses, warm extremities  Respiratory:  Wheezing (sounds like upper airway), no focal rales or rhonchi, no increased WOB  Abdomen:   NABS, soft, NT/ND  MSK:   Normal tone and bulk, no LEE  Neuro:  Grossly intact moves all extremities, able to follow simple commands  Data Reviewed: Basic Metabolic Panel:  Recent Labs Lab 03/23/15 0659 03/24/15 0718 03/25/15 0742 03/26/15 0708 03/27/15 0650  NA 139 140 138 139 140  K 4.9 4.1 3.8 4.0 4.1  CL 108 110 108 110 111  CO2 24 25 24 24 25   GLUCOSE 128* 138* 117* 114* 118*  BUN 25* 26* 23* 20 17  CREATININE 1.09* 1.42*  1.31* 1.18* 1.16*  CALCIUM 8.6* 8.2* 7.3* 7.6* 7.8*   Liver Function Tests:  Recent Labs Lab 03/25/15 0742  AST 19  ALT 19  ALKPHOS 27*  BILITOT 0.5  PROT 5.4*  ALBUMIN 2.8*   No results for input(s): LIPASE, AMYLASE in the last 168 hours. No results for input(s): AMMONIA in the last 168 hours. CBC:  Recent Labs Lab 03/22/15 2014 03/23/15 0905 03/23/15 1430 03/24/15 0718 03/25/15 0742 03/26/15 0708 03/27/15 0650  WBC 15.8* 7.8  --  9.4 6.8 5.9 6.3  NEUTROABS 13.4*  --   --   --   --   --   --   HGB 10.0* 9.2*  --  8.2* 7.5* 7.1* 9.4*  HCT 30.9* 28.1* 26.2* 25.0* 22.9* 21.7* 27.8*  MCV 104.4* 103.7*  --  103.3* 104.6* 103.3* 96.5  PLT 208 186  --  151 116* 141* 145*   Cardiac Enzymes:  Recent Labs Lab 03/22/15 2014 03/23/15 0659 03/23/15 0905 03/23/15 1430  TROPONINI 0.03 0.03 0.03 0.04*   BNP (last 3 results) No results for input(s): BNP in the last 8760 hours.  ProBNP (last 3 results) No results for input(s): PROBNP in the last 8760  hours.  CBG: No results for input(s): GLUCAP in the last 168 hours.  Recent Results (from the past 240 hour(s))  Urine culture     Status: None   Collection Time: 03/23/15  4:18 PM  Result Value Ref Range Status   Specimen Description URINE, CATHETERIZED  Final   Special Requests NONE  Final   Colony Count NO GROWTH Performed at [facility]   Final   Culture NO GROWTH Performed at [facility]   Final   Report Status 03/24/2015 FINAL  Final  Culture, blood (routine x 2)     Status: None (Preliminary result)   Collection Time: 03/24/15 12:25 AM  Result Value Ref Range Status   Specimen Description BLOOD RIGHT HAND  Final   Special Requests   Final    BOTTLES DRAWN AEROBIC AND ANAEROBIC AEB=8CC ANA=6CC   Culture NO GROWTH 3 DAYS  Final   Report Status PENDING  Incomplete  Culture, blood (routine x 2)     Status: None (Preliminary result)   Collection Time: 03/24/15 12:40 AM  Result Value Ref Range Status   Specimen Description BLOOD RIGHT ANTECUBITAL  Final   Special Requests BOTTLES DRAWN AEROBIC AND ANAEROBIC 8CC EACH  Final   Culture NO GROWTH 3 DAYS  Final   Report Status PENDING  Incomplete     Studies: Dg Lumbar Spine 2-3 Views  03/25/2015   CLINICAL DATA:  Recent fall. Found down. Altered mental status with history of dementia.  EXAM: LUMBAR SPINE - 2-3 VIEW  COMPARISON:  Abdominal pelvic CTs 03/24/2015 and 10/26/2014.  FINDINGS: The bones are diffusely demineralized. Bone detail is mildly limited by contrast material within the colon from the recent CT. There are 5 lumbar type vertebral bodies. Greater than 50% compression deformity at L1 is unchanged from the CT of 5 months ago. No acute lumbar spine fractures are identified. The alignment is stable and near anatomic. As seen on recent CT, there are acute appearing fractures of the right superior and inferior pubic rami.  IMPRESSION: No acute lumbar spine findings demonstrated. Stable L1 compression  deformity.  Acute mildly displaced fractures of the right superior and inferior pubic rami.   Electronically Signed   By: Richardean Sale M.D.   On: 03/25/2015 19:24  Scheduled Meds: . amoxicillin-clavulanate  1 tablet Oral BID  . aspirin EC  81 mg Oral Daily  . citalopram  20 mg Oral Daily  . donepezil  5 mg Oral QHS  . feeding supplement (RESOURCE BREEZE)  1 Container Oral Q24H  . furosemide  20 mg Intravenous Once  . memantine  10 mg Oral BID  . pantoprazole  40 mg Oral Daily  . polyvinyl alcohol  1 drop Both Eyes TID  . primidone  25 mg Oral QHS  . psyllium  1 packet Oral QHS  . sodium chloride  3 mL Intravenous Q12H   Continuous Infusions:    Principal Problem:   Fall Active Problems:   PARKINSON'S DISEASE   Essential hypertension   Frequent falls   Syncope and collapse   Atypical chest pain   Scalp laceration   Faintness   Fever presenting with conditions classified elsewhere   Acute renal failure superimposed on stage 3 chronic kidney disease   Closed head injury   Anemia, iron deficiency   Aspiration pneumonia    Time spent: 30 min    [place] Jaquith, [place] [facility] Pager [phone]. If 7PM-7AM, please contact night-coverage at [email or web address], password [facility] 03/27/2015, 1:32 PM  LOS: 3 days

## 2015-03-27 NOTE — Progress Notes (Signed)
Post void residual volume = 124ml per bladder scan. According to the MD, will not need to be catheterized at this time

## 2015-03-27 NOTE — Care Management Note (Signed)
Case Management Note  Patient Details  Name: [name] [name] MRN: [id number] Date of Birth: Sep 07, 1924  Subjective/Objective:                    Action/Plan:   Expected Discharge Date:  03/25/15               Expected Discharge Plan:  Assisted Living / Rest Home  In-House Referral:  Clinical Social Work  Discharge planning Services  CM Consult  Post Acute Care Choice:    Choice offered to:     DME Arranged:    DME Agency:     HH Arranged:    [place] Agency:     Status of Service:  Completed, signed off  Medicare Important Message Given:  Yes Date Medicare IM Given:  03/27/15 Medicare IM give by:  Christinia Gully, RN BSN CM Date Additional Medicare IM Given:    Additional Medicare Important Message give by:     If discussed at [place] of Stay Meetings, dates discussed:    Additional Comments: Anticipate discharge to [facility] on 03/28/15. CSW on call to arrange discharge to facility. Christinia Gully [place], RN 03/27/2015, 2:38 PM

## 2015-03-27 NOTE — Progress Notes (Signed)
MD indicates plans for dc to SNF tomorrow- family has been advised of the bed offers and will plan for transfer to [facility] on Saturday. CSW has confirmed with SNF dc for tomorrow- will update weekend coverage of plans for final coordination. HCPOA will do paperwork at SNF today in anticipation of dc tomorrow.   Eduard Clos, MSW, [place]

## 2015-03-27 NOTE — Discharge Summary (Addendum)
Physician Discharge Summary  [name] [name] SNK:[id number] DOB: Nov 07, 1924 DOA: 03/22/2015  PCP: Sallee Lange, MD  Admit date: 03/22/2015 Discharge date:  03/28/2015  Recommendations for Outpatient Follow-up:  1. Please remove staples on 5/9.   2. PCP or supervising MD to please repeat BMP and CBC in 1 week to check creatinine, anemia.  Check blood pressure and if markedly elevated, consider resuming ARB or other blood pressure medication 3. Weight bearing as tolerated 4. Weigh her daily and if she gains more than 5-lbs from the time of discharge, please notify supervising MD 5. Continue augmentin through 5/9, then stop.  Discharge Diagnoses:  Principal Problem:   Syncope and collapse Active Problems:   PARKINSON'S DISEASE   Essential hypertension   Frequent falls   CKD (chronic kidney disease) stage 3, GFR 30-59 ml/min   Wheezing   Fall   Atypical chest pain   Scalp laceration   Faintness   Fever presenting with conditions classified elsewhere   Acute renal failure superimposed on stage 3 chronic kidney disease   Closed head injury   Anemia, iron deficiency   Aspiration pneumonia   Sepsis   Pubic ramus fracture   Discharge Condition: stable, improved  Diet recommendation: dysphagia 2 with thin liquids  Wt Readings from Last 3 Encounters:  03/23/15 77.111 kg (170 lb)  03/05/15 78.472 kg (173 lb)  01/06/15 75.751 kg (167 lb)    History of present illness:   81 year old female with history of dementia, hypertension, hyperlipidemia presented to emergency department when she was found on the floor in the bathroom of her skilled nursing facility. Apparently, the patient was on the commode, and subsequently was found on the floor. It was unclear whether she had true syncope or had a mechanical fall. In emergency department, the patient was found to have laceration of her frontal scalp which was repaired. The patient was to be discharged back to the facility, but she had 2  syncopal episodes one of which she had bowel and bladder incontinence. As result, the patient was medically for further evaluation. At baseline, the patient is wheelchair bound. She requires at least 1 person max assist for transfers. Discussion about whether the patient is able to verbalize her needs due to her advanced dementia. On the evening of 03/23/2015, the patient spiked a temperature 102.76F. She was started on broad spectrum antibiotics and has clinically improved. Unclear source, but suspect she may have aspirated. she was transitioned to Augmentin to complete a 7-day course for possible pneumonia.  She also developed some worsening anemia which was probably due to chronic disease, zosyn, and IVF.  Iron studies suggested some iron deficiency and she was transfused 2 units PRBC.  She developed some increased SOB after her blood transfusion which was likely due to acute on chronic diastolic heart failure and she was given a dose of lasix.  If her breathing improves, plan to transfer to SNF on 5/7.  Hospital Course:   Syncope, may have been due to mild volume depletion and generalized weakness superimposed on right hemiparesis from previous stroke. Orthostatic vital signs were negative, but they were only able to be completed lying and sitting because she is unable to stand. Telemetry remained NSR, no dangerous arrhythmias. Echocardiogram demonstrated moderate LVH with EF 65-70%, grade 1 DD, mild MR, mild TR, PA peak 48 mmHg. No regional wall motion abnormalities. EEG was performed and demonstrated diffuse slowing, but no epileptiform activity and she did not have shaking suggestive of seizure during her  witnessed events in the ER. She had some progressive anemia, but her occult stool was negative. She was transfused 2 units of blood, hydrated and her episodes resolved.  - Staples placed on right frontal scalp will need to be removed on Monday 5/9.  Sepsis due to probable aspiration  pneumonia.  She spiked fever of 102.66F on the evening of 03/23/2015 which was concerning for aspiration given her previous episodes of syncope. CT abdomen and pelvis--no acute abnormality to explain fever but did see incidentally noted pubic rami fracture.   -Urinalysis negative for pyuria and urine culture NGF -Blood cultures NGTD - She was started on empiric vanco and zosyn but transitioned to augmentin on 5/5 to complete a 7 day course for aspiration pneumonia, last day on 03/30/2015  Acute hypoxic respiratory failure likely secondary to atelectasis vs. Aspiration pneumonia, initially improved, but then worsened overnight on 5/5-5/6, likely due to some acute on chronic diastolic heart failure after IVF and blood transfusions.  She was given IV lasix once which resolved her symptoms but given risk of dehydration would recommend against starting standing lasix.  Staff to notify supervising physician if she develops swelling of her ankles  She will complete treatment for her aspiration pneumonia and recommend that she be placed in a chair during the day to help with her atelectasis.    Pubic rami fracture found incidentally on CT abd/pelvis. Orthopedics was consulted and recommended nonoperative management according to orthopedics. She also had some follow up X-rays of her lumbar spine to make sure her compression fracture of L1 appeared stable, which is was. Weight bearing as tolerated. She is safe to perform transfers with assistance.   Acute on chronic renal failure (CKD3), Baseline creatinine 1.0-1.2, peak of 1.42 and trended down to baseline with IVF and holding ARB.  Essential hypertension, mild. Stopped her ARB and allowed her BP to run a little higher (150s). I am hopeful that making this change will help with her syncope and reduce her pill burden. Also, she had some AKI. Review BP again in a week and consider resuming a blood pressure medication if markedly elevated.   Atypical chest  pain, likely due to the patient's mechanical fall. Troponins were negative and she had no concerning ST segment changes.   Dementia, baseline mentation. Continued Namenda and Aricept  Lower extremity pain and edema, venous duplex was neg for DVT  Gait instability/fall, due to her dementia, she is not a good rehabilitation candidate.  Macrocytic anemia and thrombocytopenia, hgb trended down with IVF to 7.1, no obvious bleeding. LFTs and bilirubin normal suggesting this was not hemolysis. Thrombocytopenia resolved so not likely DIC. May have been related to IVF and zosyn.  - Transfused 2 unit PRBC on 5/5 with appropriate response of hemoglobin - Iron studies cw iron deficiency, B12 972, folate RBC wnl, TSH 2.117 - Occult stool negative  Consultants:  Orthopedics  Procedures:  CT abdomen and pelvis  Chest x-ray  X-ray of the lumbar spine  Echo  EEG  Antibiotics:  Vancomycin from 5/2 > 5/5  Zosyn from 5/3 > 5/5  Augmentin 5/5 >  Discharge Exam: Filed Vitals:   03/28/15 0554  BP: 162/86  Pulse: 119  Temp: 98.9 F (37.2 C)  Resp: 20   Filed Vitals:   03/27/15 0000 03/27/15 0500 03/27/15 1501 03/28/15 0554  BP: 164/88 149/81 157/84 162/86  Pulse: 71 76 73 119  Temp: 98.6 F (37 C) 98.3 F (36.8 C) 98.7 F (37.1 C) 98.9 F (37.2  C)  TempSrc: Oral Oral Oral Axillary  Resp: 20 18 20 20   Height:      Weight:      SpO2:  94% 96% 91%     General: Pleasant obese F, No acute distress, asleep but arousable.  HEENT: Laceration over right frontal area with intact staples, edges well approximated, no induration, erythema, or discharge, MMM  Cardiovascular: RRR, nl S1, S2,systolic murmur at the RSB, 2+ pulses, warm extremities  Respiratory: Wheezing (sounds like upper airway), no focal rales or rhonchi, no increased WOB  Abdomen: NABS, soft, NT/ND  MSK: Normal tone and bulk, no LEE  Neuro: Grossly intact moves all extremities, able to follow  simple commands  Discharge Instructions      Discharge Instructions    (HEART FAILURE PATIENTS) Call MD:  Anytime you have any of the following symptoms: 1) 3 pound weight gain in 24 hours or 5 pounds in 1 week 2) shortness of breath, with or without a dry hacking cough 3) swelling in the hands, feet or stomach 4) if you have to sleep on extra pillows at night in order to breathe.    Complete by:  As directed      Call MD for:  difficulty breathing, headache or visual disturbances    Complete by:  As directed      Call MD for:  extreme fatigue    Complete by:  As directed      Call MD for:  hives    Complete by:  As directed      Call MD for:  persistant dizziness or light-headedness    Complete by:  As directed      Call MD for:  persistant nausea and vomiting    Complete by:  As directed      Call MD for:  redness, tenderness, or signs of infection (pain, swelling, redness, odor or green/yellow discharge around incision site)    Complete by:  As directed      Call MD for:  severe uncontrolled pain    Complete by:  As directed      Call MD for:  temperature >100.4    Complete by:  As directed      Diet general    Complete by:  As directed      Discharge wound care:    Complete by:  As directed   Do not submerge staples.  Keep area clean and dry.  May wash with soap and water.  Please remove staples on 5/9.     Increase activity slowly    Complete by:  As directed             Medication List    STOP taking these medications        B-complex with vitamin C tablet     BENICAR 20 MG tablet  Generic drug:  olmesartan     bisacodyl 10 MG suppository  Commonly known as:  DULCOLAX     guaifenesin 100 MG/5ML syrup  Commonly known as:  ROBITUSSIN     ketoconazole 2 % cream  Commonly known as:  NIZORAL     loperamide 2 MG capsule  Commonly known as:  IMODIUM     loratadine 10 MG tablet  Commonly known as:  CLARITIN     ondansetron 4 MG disintegrating tablet   Commonly known as:  ZOFRAN-ODT     traMADol 50 MG tablet  Commonly known as:  ULTRAM      TAKE these  medications        acetaminophen 500 MG tablet  Commonly known as:  Q-PAP  Take 1 tablet (500 mg total) by mouth 3 (three) times daily.     amoxicillin-clavulanate 500-125 MG per tablet  Commonly known as:  AUGMENTIN  Take 1 tablet (500 mg total) by mouth 2 (two) times daily.     ASPIRIN LOW DOSE 81 MG EC tablet  Generic drug:  aspirin  TAKE 1 TABLET BY MOUTH ONCE DAILY.     BENEFIBER DRINK MIX Pack  Take 4 g by mouth at bedtime.     citalopram 20 MG tablet  Commonly known as:  CELEXA  TAKE 1 TABLET BY MOUTH ONCE DAILY.(FOR DEPRESSION)     donepezil 5 MG tablet  Commonly known as:  ARICEPT  TAKE 1 TABLET BY MOUTH EACH MORNING.     feeding supplement (RESOURCE BREEZE) Liqd  Take 1 Container by mouth daily.     memantine 10 MG tablet  Commonly known as:  NAMENDA  TAKE 1 TABLET BY MOUTH TWICE DAILY.     multivitamin tablet  Take 1 tablet by mouth daily.     omeprazole 20 MG capsule  Commonly known as:  PRILOSEC  TAKE 1 CAPSULE BY MOUTH TWICE DAILY.     polyethylene glycol powder powder  Commonly known as:  GLYCOLAX/MIRALAX  MIX 1 CAPFUL (17G) IN 8 OUNCES OF JUICE/WATER AND DRINK ONCE DAILY AS NEEDED FOR MILD CONSTIPATION     polyvinyl alcohol 1.4 % ophthalmic solution  Commonly known as:  LIQUIFILM TEARS  Place 1 drop into both eyes 3 (three) times daily.     primidone 50 MG tablet  Commonly known as:  MYSOLINE  TAKE 1/2 TABLET (25mg ) BY MOUTH AT BEDTIME.       Follow-up Information    Follow up with [facility].   Specialty:  Emergency Medicine   Why:  As needed, If symptoms worsen   Contact information:   [address] [id number] [place] [address] [phone]      Follow up with Sallee Lange, MD. Schedule an appointment as soon as possible for a visit on 03/30/2015.   Specialty:  Family Medicine   Why:   For suture removal   Contact information:   [place] [address] [phone]        The results of significant diagnostics from this hospitalization (including imaging, microbiology, ancillary and laboratory) are listed below for reference.    Significant Diagnostic Studies: Ct Abdomen Pelvis Wo Contrast  03/24/2015   CLINICAL DATA:  Abdominal pain and fever.  EXAM: CT ABDOMEN AND PELVIS WITHOUT CONTRAST  TECHNIQUE: Multidetector CT imaging of the abdomen and pelvis was performed following the standard protocol without IV contrast.  COMPARISON:  10/26/2014  FINDINGS: Lower chest: The lung bases demonstrate respiratory motion. There is bibasilar atelectasis. The heart is within normal limits in size. Coronary artery calcifications are noted. There is a large hiatal hernia.  Hepatobiliary: No focal hepatic lesions or intrahepatic biliary dilatation. The gallbladder is surgically absent. No significant common bile duct dilatation.  Pancreas: Diffuse fatty change but no inflammation or mass.  Spleen: Normal.  Adrenals/Urinary Tract: A right renal calculus is noted. Stable prominent extra renal pelves bilaterally. No obstructing ureteral calculi or bladder calculi. There is a stable complex cyst associated with the right kidney with multiple thin septations and calcifications.  Stomach/Bowel: The stomach, duodenum, small bowel and colon are grossly normal. No  inflammatory changes, mass lesions or obstructive findings.  Vascular/Lymphatic: No mesenteric or retroperitoneal mass or adenopathy. The aorta is normal in caliber. Stable atherosclerotic calcifications.  Other: No pelvic mass or adenopathy. The bladder appears normal. No inguinal mass or adenopathy.  Musculoskeletal: There are superior and inferior pubic rami fractures on the right side which appear to be acute. No hip fracture. No definite sacral fractures. There is a the stable L1 compression fracture.  IMPRESSION: 1. No  acute abdominal/pelvic findings. 2. Stable complex cystic lesion associated with the right kidney. 3. Large hiatal hernia. 4. Acute right-sided pubic rami fractures.   Electronically Signed   By: Marijo Sanes M.D.   On: 03/24/2015 14:38   Dg Chest 2 View  03/22/2015   CLINICAL DATA:  [address] off toilet, hit head on counter. Became unresponsive in emergency department.  EXAM: CHEST  2 VIEW  COMPARISON:  Chest radiograph Mar 27, 2014 weeks and CT of the abdomen and pelvis October 26, 2014  FINDINGS: The cardiac silhouette is upper limits of normal in size, tortuous calcified aorta. No pleural effusion or focal consolidation. No pneumothorax. ACDF. Osteopenia. Soft tissue planes are nonsuspicious. Moderate hiatal hernia better seen on prior CT. L1 burst fracture was present previously.  IMPRESSION: Borderline cardiomegaly, no acute pulmonary process.   Electronically Signed   By: Elon Alas   On: 03/22/2015 22:51   Dg Lumbar Spine 2-3 Views  03/25/2015   CLINICAL DATA:  Recent fall. Found down. Altered mental status with history of dementia.  EXAM: LUMBAR SPINE - 2-3 VIEW  COMPARISON:  Abdominal pelvic CTs 03/24/2015 and 10/26/2014.  FINDINGS: The bones are diffusely demineralized. Bone detail is mildly limited by contrast material within the colon from the recent CT. There are 5 lumbar type vertebral bodies. Greater than 50% compression deformity at L1 is unchanged from the CT of 5 months ago. No acute lumbar spine fractures are identified. The alignment is stable and near anatomic. As seen on recent CT, there are acute appearing fractures of the right superior and inferior pubic rami.  IMPRESSION: No acute lumbar spine findings demonstrated. Stable L1 compression deformity.  Acute mildly displaced fractures of the right superior and inferior pubic rami.   Electronically Signed   By: Richardean Sale M.D.   On: 03/25/2015 19:24   Ct Head Wo Contrast  03/22/2015   CLINICAL DATA:  Status post fall.  Initial  encounter.  EXAM: CT HEAD WITHOUT CONTRAST  CT CERVICAL SPINE WITHOUT CONTRAST  TECHNIQUE: Multidetector CT imaging of the head and cervical spine was performed following the standard protocol without intravenous contrast. Multiplanar CT image reconstructions of the cervical spine were also generated.  COMPARISON:  Head CT 06/10/2014.  Cervical spine CT 06/16/2013.  FINDINGS: CT HEAD FINDINGS  There is no evidence of acute intracranial hemorrhage, mass lesion, brain edema or extra-axial fluid collection. There is diffuse prominence of the ventricles and subarachnoid spaces consistent with moderate atrophy, similar to the prior examination. Extensive periventricular and subcortical white matter disease appears unchanged. There is no evidence of acute cortical infarct.  Soft tissue swelling is present in the right frontal scalp. There is no evidence of calvarial fracture. The visualized paranasal sinuses, mastoid air cells and middle ears are clear. 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  CT CERVICAL SPINE FINDINGS  The cervical alignment is stable status post C6-7 ACDF with an anterior plate and screws. Interbody fusion appears solid. There is a convex left scoliosis with a degenerative grade 1  anterolisthesis at C5-6. There is no evidence acute fracture or traumatic subluxation. Multilevel degenerative changes are present with asymmetric right-sided facet disease. There is prominent synovial thickening surrounding the odontoid process. No acute soft tissue findings identified. Internal carotid artery calcifications noted.  IMPRESSION: 1. Right frontal scalp soft tissue injury. 2. No acute intracranial or calvarial findings. 3. No evidence of acute cervical spine fracture, traumatic subluxation or static signs of instability.   Electronically Signed   By: Richardean Sale M.D.   On: 03/22/2015 17:35   Ct Cervical Spine Wo Contrast  03/22/2015   CLINICAL DATA:  Status post fall.  Initial encounter.  EXAM: CT HEAD  WITHOUT CONTRAST  CT CERVICAL SPINE WITHOUT CONTRAST  TECHNIQUE: Multidetector CT imaging of the head and cervical spine was performed following the standard protocol without intravenous contrast. Multiplanar CT image reconstructions of the cervical spine were also generated.  COMPARISON:  Head CT 06/10/2014.  Cervical spine CT 06/16/2013.  FINDINGS: CT HEAD FINDINGS  There is no evidence of acute intracranial hemorrhage, mass lesion, brain edema or extra-axial fluid collection. There is diffuse prominence of the ventricles and subarachnoid spaces consistent with moderate atrophy, similar to the prior examination. Extensive periventricular and subcortical white matter disease appears unchanged. There is no evidence of acute cortical infarct.  Soft tissue swelling is present in the right frontal scalp. There is no evidence of calvarial fracture. The visualized paranasal sinuses, mastoid air cells and middle ears are clear. 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  CT CERVICAL SPINE FINDINGS  The cervical alignment is stable status post C6-7 ACDF with an anterior plate and screws. Interbody fusion appears solid. There is a convex left scoliosis with a degenerative grade 1 anterolisthesis at C5-6. There is no evidence acute fracture or traumatic subluxation. Multilevel degenerative changes are present with asymmetric right-sided facet disease. There is prominent synovial thickening surrounding the odontoid process. No acute soft tissue findings identified. Internal carotid artery calcifications noted.  IMPRESSION: 1. Right frontal scalp soft tissue injury. 2. No acute intracranial or calvarial findings. 3. No evidence of acute cervical spine fracture, traumatic subluxation or static signs of instability.   Electronically Signed   By: Richardean Sale M.D.   On: 03/22/2015 17:35   US Venous Img Lower Bilateral  03/24/2015   EXAM: BILATERAL LOWER EXTREMITY VENOUS DOPPLER ULTRASOUND  TECHNIQUE: Gray-scale sonography with  graded compression, as well as color Doppler and duplex ultrasound were performed to evaluate the lower extremity deep venous systems from the level of the common femoral vein and including the common femoral, femoral, profunda femoral, popliteal and calf veins including the posterior tibial, peroneal and gastrocnemius veins when visible. The superficial great saphenous vein was also interrogated. Spectral Doppler was utilized to evaluate flow at rest and with distal augmentation maneuvers in the common femoral, femoral and popliteal veins.  COMPARISON:  None.  FINDINGS: RIGHT LOWER EXTREMITY  Common Femoral Vein: No evidence of thrombus. Normal compressibility, respiratory phasicity and response to augmentation.  Saphenofemoral Junction: No evidence of thrombus. Normal compressibility and flow on color Doppler imaging.  Profunda Femoral Vein: No evidence of thrombus. Normal compressibility and flow on color Doppler imaging.  Femoral Vein: No evidence of thrombus. Normal compressibility, respiratory phasicity and response to augmentation.  Popliteal Vein: No evidence of thrombus. Normal compressibility, respiratory phasicity and response to augmentation.  Calf Veins: No evidence of thrombus. Normal compressibility and flow on color Doppler imaging.  Superficial Great Saphenous Vein: No evidence of thrombus. Normal compressibility  and flow on color Doppler imaging.  Venous Reflux:  None.  Other Findings:  None.  LEFT LOWER EXTREMITY  Common Femoral Vein: No evidence of thrombus. Normal compressibility, respiratory phasicity and response to augmentation.  Saphenofemoral Junction: No evidence of thrombus. Normal compressibility and flow on color Doppler imaging.  Profunda Femoral Vein: No evidence of thrombus. Normal compressibility and flow on color Doppler imaging.  Femoral Vein: No evidence of thrombus. Normal compressibility, respiratory phasicity and response to augmentation.  Popliteal Vein: No evidence of  thrombus. Normal compressibility, respiratory phasicity and response to augmentation.  Calf Veins: No evidence of thrombus. Normal compressibility and flow on color Doppler imaging.  Superficial Great Saphenous Vein: No evidence of thrombus. Normal compressibility and flow on color Doppler imaging.  Venous Reflux:  None.  Other Findings: This is a limited exam due to patient movement and pain tolerance.  IMPRESSION: No evidence of deep venous thrombosis. Exam was limited due to patient movement and pain tolerance.   Electronically Signed   By: Marcello Moores  Register   On: 03/24/2015 08:06   Dg Shoulder Left  03/05/2015   CLINICAL DATA:  Left shoulder pain several days. No injury. Alzheimer's disease.  EXAM: LEFT SHOULDER - 2+ VIEW  COMPARISON:  Chest x-ray 03/27/2014  FINDINGS: There are mild degenerative changes of the [facility] joint greater than the glenohumeral joint. No fracture or dislocation.  IMPRESSION: No acute findings.  Mild degenerative changes.   Electronically Signed   By: Marin Olp M.D.   On: 03/05/2015 13:14    Microbiology: Recent Results (from the past 240 hour(s))  Urine culture     Status: None   Collection Time: 03/23/15  4:18 PM  Result Value Ref Range Status   Specimen Description URINE, CATHETERIZED  Final   Special Requests NONE  Final   Colony Count NO GROWTH Performed at [facility]   Final   Culture NO GROWTH Performed at [facility]   Final   Report Status 03/24/2015 FINAL  Final  Culture, blood (routine x 2)     Status: None (Preliminary result)   Collection Time: 03/24/15 12:25 AM  Result Value Ref Range Status   Specimen Description BLOOD RIGHT HAND  Final   Special Requests   Final    BOTTLES DRAWN AEROBIC AND ANAEROBIC AEB=8CC ANA=6CC   Culture NO GROWTH 3 DAYS  Final   Report Status PENDING  Incomplete  Culture, blood (routine x 2)     Status: None (Preliminary result)   Collection Time: 03/24/15 12:40 AM  Result Value Ref Range Status    Specimen Description BLOOD RIGHT ANTECUBITAL  Final   Special Requests BOTTLES DRAWN AEROBIC AND ANAEROBIC 8CC EACH  Final   Culture NO GROWTH 3 DAYS  Final   Report Status PENDING  Incomplete     Labs: Basic Metabolic Panel:  Recent Labs Lab 03/24/15 0718 03/25/15 0742 03/26/15 0708 03/27/15 0650 03/28/15 0536  NA 140 138 139 140 139  K 4.1 3.8 4.0 4.1 4.1  CL 110 108 110 111 106  CO2 25 24 24 25 26   GLUCOSE 138* 117* 114* 118* 104*  BUN 26* 23* 20 17 21*  CREATININE 1.42* 1.31* 1.18* 1.16* 1.01*  CALCIUM 8.2* 7.3* 7.6* 7.8* 8.2*   Liver Function Tests:  Recent Labs Lab 03/25/15 0742  AST 19  ALT 19  ALKPHOS 27*  BILITOT 0.5  PROT 5.4*  ALBUMIN 2.8*   No results for input(s): LIPASE, AMYLASE in the last 168  hours. No results for input(s): AMMONIA in the last 168 hours. CBC:  Recent Labs Lab 03/22/15 2014  03/24/15 0718 03/25/15 0742 03/26/15 0708 03/27/15 0650 03/28/15 0536  WBC 15.8*  < > 9.4 6.8 5.9 6.3 6.3  NEUTROABS 13.4*  --   --   --   --   --   --   HGB 10.0*  < > 8.2* 7.5* 7.1* 9.4* 10.2*  HCT 30.9*  < > 25.0* 22.9* 21.7* 27.8* 31.1*  MCV 104.4*  < > 103.3* 104.6* 103.3* 96.5 98.4  PLT 208  < > 151 116* 141* 145* 198  < > = values in this interval not displayed. Cardiac Enzymes:  Recent Labs Lab 03/22/15 2014 03/23/15 0659 03/23/15 0905 03/23/15 1430  TROPONINI 0.03 0.03 0.03 0.04*   BNP: BNP (last 3 results) No results for input(s): BNP in the last 8760 hours.  ProBNP (last 3 results) No results for input(s): PROBNP in the last 8760 hours.  CBG: No results for input(s): GLUCAP in the last 168 hours.  Time coordinating discharge: 35 minutes  Signed:  Quayshaun Hubbert  [facility] 03/28/2015, 10:29 AM

## 2015-03-27 NOTE — Clinical Social Work Placement (Signed)
   CLINICAL SOCIAL WORK PLACEMENT  NOTE  Date:  03/27/2015  Patient Details  Name: MARSHAE AZAM MRN: [id number] Date of Birth: 06/18/1924  Clinical Social Work is seeking post-discharge placement for this patient at the [place] level of care (*CSW will initial, date and re-position this form in  chart as items are completed):  Yes   Patient/family provided with [facility] Work Department's list of facilities offering this level of care within the geographic area requested by the patient (or if unable, by the patient's family).  Yes   Patient/family informed of their freedom to choose among providers that offer the needed level of care, that participate in Medicare, Medicaid or managed care program needed by the patient, have an available bed and are willing to accept the patient.  Yes   Patient/family informed of [facility]'s ownership interest in [facility] and [facility], as well as of the fact that they are under no obligation to receive care at these facilities.  PASRR submitted to EDS on 03/26/15     PASRR number received on 03/26/15     Existing PASRR number confirmed on       FL2 transmitted to all facilities in geographic area requested by pt/family on 03/26/15     FL2 transmitted to all facilities within larger geographic area on 03/26/15     Patient informed that his/her managed care company has contracts with or will negotiate with certain facilities, including the following:            Patient/family informed of bed offers received.  Patient chooses bed at  [facility])     Physician recommends and patient chooses bed at      Patient to be transferred to  [facility]) on 03/28/15.  Patient to be transferred to facility by  (EMS)     Patient family notified on 03/27/15 of transfer.  Name of family member notified:   ([place])     PHYSICIAN Please prepare priority discharge summary, including  medications, Please sign FL2     Additional Comment:    _______________________________________________ Ludwig Clarks, LCSW 03/27/2015, 12:19 PM

## 2015-03-28 DIAGNOSIS — R279 Unspecified lack of coordination: Secondary | ICD-10-CM | POA: Diagnosis not present

## 2015-03-28 DIAGNOSIS — Z7401 Bed confinement status: Secondary | ICD-10-CM | POA: Diagnosis not present

## 2015-03-28 DIAGNOSIS — G20 Parkinson's disease: Secondary | ICD-10-CM | POA: Diagnosis not present

## 2015-03-28 DIAGNOSIS — I1310 Hypertensive heart and chronic kidney disease without heart failure, with stage 1 through stage 4 chronic kidney disease, or unspecified chronic kidney disease: Secondary | ICD-10-CM | POA: Diagnosis not present

## 2015-03-28 DIAGNOSIS — S32599A Other specified fracture of unspecified pubis, initial encounter for closed fracture: Secondary | ICD-10-CM

## 2015-03-28 DIAGNOSIS — Z8673 Personal history of transient ischemic attack (TIA), and cerebral infarction without residual deficits: Secondary | ICD-10-CM | POA: Diagnosis not present

## 2015-03-28 DIAGNOSIS — Z9181 History of falling: Secondary | ICD-10-CM | POA: Diagnosis not present

## 2015-03-28 DIAGNOSIS — M6281 Muscle weakness (generalized): Secondary | ICD-10-CM | POA: Diagnosis not present

## 2015-03-28 DIAGNOSIS — K5790 Diverticulosis of intestine, part unspecified, without perforation or abscess without bleeding: Secondary | ICD-10-CM | POA: Diagnosis not present

## 2015-03-28 DIAGNOSIS — J690 Pneumonitis due to inhalation of food and vomit: Secondary | ICD-10-CM | POA: Diagnosis not present

## 2015-03-28 DIAGNOSIS — R0789 Other chest pain: Secondary | ICD-10-CM | POA: Diagnosis not present

## 2015-03-28 DIAGNOSIS — K469 Unspecified abdominal hernia without obstruction or gangrene: Secondary | ICD-10-CM | POA: Diagnosis not present

## 2015-03-28 DIAGNOSIS — E785 Hyperlipidemia, unspecified: Secondary | ICD-10-CM | POA: Diagnosis not present

## 2015-03-28 DIAGNOSIS — S0990XD Unspecified injury of head, subsequent encounter: Secondary | ICD-10-CM | POA: Diagnosis not present

## 2015-03-28 DIAGNOSIS — R609 Edema, unspecified: Secondary | ICD-10-CM | POA: Diagnosis not present

## 2015-03-28 DIAGNOSIS — I2510 Atherosclerotic heart disease of native coronary artery without angina pectoris: Secondary | ICD-10-CM | POA: Diagnosis not present

## 2015-03-28 DIAGNOSIS — Z955 Presence of coronary angioplasty implant and graft: Secondary | ICD-10-CM | POA: Diagnosis not present

## 2015-03-28 DIAGNOSIS — G309 Alzheimer's disease, unspecified: Secondary | ICD-10-CM | POA: Diagnosis not present

## 2015-03-28 DIAGNOSIS — N189 Chronic kidney disease, unspecified: Secondary | ICD-10-CM | POA: Diagnosis not present

## 2015-03-28 DIAGNOSIS — F0390 Unspecified dementia without behavioral disturbance: Secondary | ICD-10-CM | POA: Diagnosis not present

## 2015-03-28 DIAGNOSIS — M810 Age-related osteoporosis without current pathological fracture: Secondary | ICD-10-CM | POA: Diagnosis not present

## 2015-03-28 DIAGNOSIS — N183 Chronic kidney disease, stage 3 (moderate): Secondary | ICD-10-CM | POA: Diagnosis not present

## 2015-03-28 DIAGNOSIS — E119 Type 2 diabetes mellitus without complications: Secondary | ICD-10-CM | POA: Diagnosis not present

## 2015-03-28 DIAGNOSIS — S32509D Unspecified fracture of unspecified pubis, subsequent encounter for fracture with routine healing: Secondary | ICD-10-CM | POA: Diagnosis not present

## 2015-03-28 DIAGNOSIS — K219 Gastro-esophageal reflux disease without esophagitis: Secondary | ICD-10-CM | POA: Diagnosis not present

## 2015-03-28 DIAGNOSIS — D509 Iron deficiency anemia, unspecified: Secondary | ICD-10-CM | POA: Diagnosis not present

## 2015-03-28 DIAGNOSIS — Z7982 Long term (current) use of aspirin: Secondary | ICD-10-CM | POA: Diagnosis not present

## 2015-03-28 DIAGNOSIS — A419 Sepsis, unspecified organism: Secondary | ICD-10-CM

## 2015-03-28 DIAGNOSIS — R55 Syncope and collapse: Secondary | ICD-10-CM | POA: Diagnosis not present

## 2015-03-28 LAB — BASIC METABOLIC PANEL
Anion gap: 7 (ref 5–15)
BUN: 21 mg/dL — AB (ref 6–20)
CO2: 26 mmol/L (ref 22–32)
CREATININE: 1.01 mg/dL — AB (ref 0.44–1.00)
Calcium: 8.2 mg/dL — ABNORMAL LOW (ref 8.9–10.3)
Chloride: 106 mmol/L (ref 101–111)
GFR calc Af Amer: 56 mL/min — ABNORMAL LOW (ref >60)
GFR calc non Af Amer: 48 mL/min — ABNORMAL LOW (ref >60)
GLUCOSE: 104 mg/dL — AB (ref 70–99)
Potassium: 4.1 mmol/L (ref 3.5–5.1)
Sodium: 139 mmol/L (ref 135–145)

## 2015-03-28 LAB — CBC
HCT: 31.1 % — ABNORMAL LOW (ref 36.0–46.0)
Hemoglobin: 10.2 g/dL — ABNORMAL LOW (ref 12.0–15.0)
MCH: 32.3 pg (ref 26.0–34.0)
MCHC: 32.8 g/dL (ref 30.0–36.0)
MCV: 98.4 fL (ref 78.0–100.0)
PLATELETS: 198 10*3/uL (ref 150–400)
RBC: 3.16 MIL/uL — AB (ref 3.87–5.11)
RDW: 16.5 % — ABNORMAL HIGH (ref 11.5–15.5)
WBC: 6.3 10*3/uL (ref 4.0–10.5)

## 2015-03-28 LAB — PROCALCITONIN: Procalcitonin: <0.1 ng/mL

## 2015-03-28 MED ORDER — ACETAMINOPHEN 500 MG PO TABS: 500.0000 mg | ORAL_TABLET | Freq: Three times a day (TID) | ORAL | Status: DC

## 2015-03-28 MED ORDER — POLYETHYLENE GLYCOL 3350 17 GM/SCOOP PO POWD: ORAL | Status: AC

## 2015-03-28 NOTE — Progress Notes (Signed)
Patient transferred [facility] via [facility] EMS. Report given to nurse at [facility]. Patient stable at discharge. Family to meet patient at facility.

## 2015-03-29 LAB — CULTURE, BLOOD (ROUTINE X 2)
Culture: NO GROWTH
Culture: NO GROWTH

## 2015-03-30 NOTE — Progress Notes (Signed)
UR chart review completed.  

## 2015-04-04 DIAGNOSIS — R0789 Other chest pain: Secondary | ICD-10-CM | POA: Diagnosis not present

## 2015-05-02 DIAGNOSIS — N189 Chronic kidney disease, unspecified: Secondary | ICD-10-CM | POA: Diagnosis not present

## 2015-05-02 DIAGNOSIS — F0390 Unspecified dementia without behavioral disturbance: Secondary | ICD-10-CM | POA: Diagnosis not present

## 2015-05-02 DIAGNOSIS — M6281 Muscle weakness (generalized): Secondary | ICD-10-CM | POA: Diagnosis not present

## 2015-05-02 DIAGNOSIS — G20 Parkinson's disease: Secondary | ICD-10-CM | POA: Diagnosis not present

## 2015-06-10 DIAGNOSIS — Z8673 Personal history of transient ischemic attack (TIA), and cerebral infarction without residual deficits: Secondary | ICD-10-CM | POA: Diagnosis not present

## 2015-06-10 DIAGNOSIS — K219 Gastro-esophageal reflux disease without esophagitis: Secondary | ICD-10-CM | POA: Diagnosis not present

## 2015-06-10 DIAGNOSIS — N183 Chronic kidney disease, stage 3 (moderate): Secondary | ICD-10-CM | POA: Diagnosis not present

## 2015-06-10 DIAGNOSIS — G20 Parkinson's disease: Secondary | ICD-10-CM | POA: Diagnosis not present

## 2015-06-28 DIAGNOSIS — M6281 Muscle weakness (generalized): Secondary | ICD-10-CM | POA: Diagnosis not present

## 2015-06-28 DIAGNOSIS — G20 Parkinson's disease: Secondary | ICD-10-CM | POA: Diagnosis not present

## 2015-06-28 DIAGNOSIS — R2681 Unsteadiness on feet: Secondary | ICD-10-CM | POA: Diagnosis not present

## 2015-06-28 DIAGNOSIS — R531 Weakness: Secondary | ICD-10-CM | POA: Diagnosis not present

## 2015-06-28 DIAGNOSIS — R293 Abnormal posture: Secondary | ICD-10-CM | POA: Diagnosis not present

## 2015-06-29 DIAGNOSIS — R293 Abnormal posture: Secondary | ICD-10-CM | POA: Diagnosis not present

## 2015-06-29 DIAGNOSIS — R531 Weakness: Secondary | ICD-10-CM | POA: Diagnosis not present

## 2015-06-29 DIAGNOSIS — G20 Parkinson's disease: Secondary | ICD-10-CM | POA: Diagnosis not present

## 2015-06-29 DIAGNOSIS — R2681 Unsteadiness on feet: Secondary | ICD-10-CM | POA: Diagnosis not present

## 2015-06-29 DIAGNOSIS — M6281 Muscle weakness (generalized): Secondary | ICD-10-CM | POA: Diagnosis not present

## 2015-06-30 DIAGNOSIS — M6281 Muscle weakness (generalized): Secondary | ICD-10-CM | POA: Diagnosis not present

## 2015-06-30 DIAGNOSIS — R531 Weakness: Secondary | ICD-10-CM | POA: Diagnosis not present

## 2015-06-30 DIAGNOSIS — R2681 Unsteadiness on feet: Secondary | ICD-10-CM | POA: Diagnosis not present

## 2015-06-30 DIAGNOSIS — R293 Abnormal posture: Secondary | ICD-10-CM | POA: Diagnosis not present

## 2015-06-30 DIAGNOSIS — G20 Parkinson's disease: Secondary | ICD-10-CM | POA: Diagnosis not present

## 2015-07-02 DIAGNOSIS — M6281 Muscle weakness (generalized): Secondary | ICD-10-CM | POA: Diagnosis not present

## 2015-07-02 DIAGNOSIS — R2681 Unsteadiness on feet: Secondary | ICD-10-CM | POA: Diagnosis not present

## 2015-07-02 DIAGNOSIS — R293 Abnormal posture: Secondary | ICD-10-CM | POA: Diagnosis not present

## 2015-07-02 DIAGNOSIS — G20 Parkinson's disease: Secondary | ICD-10-CM | POA: Diagnosis not present

## 2015-07-02 DIAGNOSIS — R531 Weakness: Secondary | ICD-10-CM | POA: Diagnosis not present

## 2015-07-07 ENCOUNTER — Encounter (INDEPENDENT_AMBULATORY_CARE_PROVIDER_SITE_OTHER): Payer: Self-pay | Admitting: Internal Medicine

## 2015-07-07 ENCOUNTER — Ambulatory Visit (INDEPENDENT_AMBULATORY_CARE_PROVIDER_SITE_OTHER): Payer: Medicare Other | Admitting: Internal Medicine

## 2015-07-07 VITALS — BP 110/80 | HR 70 | Temp 98.3°F | Resp 18 | Ht 60.0 in

## 2015-07-07 DIAGNOSIS — K219 Gastro-esophageal reflux disease without esophagitis: Secondary | ICD-10-CM

## 2015-07-07 DIAGNOSIS — K5900 Constipation, unspecified: Secondary | ICD-10-CM | POA: Diagnosis not present

## 2015-07-07 DIAGNOSIS — K449 Diaphragmatic hernia without obstruction or gangrene: Secondary | ICD-10-CM | POA: Diagnosis not present

## 2015-07-07 DIAGNOSIS — R531 Weakness: Secondary | ICD-10-CM | POA: Diagnosis not present

## 2015-07-07 DIAGNOSIS — G20 Parkinson's disease: Secondary | ICD-10-CM | POA: Diagnosis not present

## 2015-07-07 DIAGNOSIS — M6281 Muscle weakness (generalized): Secondary | ICD-10-CM | POA: Diagnosis not present

## 2015-07-07 DIAGNOSIS — R2681 Unsteadiness on feet: Secondary | ICD-10-CM | POA: Diagnosis not present

## 2015-07-07 DIAGNOSIS — R293 Abnormal posture: Secondary | ICD-10-CM | POA: Diagnosis not present

## 2015-07-07 NOTE — Progress Notes (Signed)
Presenting complaint;  Follow-up for GERD and constipation.  Subjective:   Patient is 81 year old Caucasian female who is here for scheduled visit accompanied by her two daughters. She was last seen 6 months ago. She has chronic GERD and constipation. She has large hiatal hernia. Her daughter [name] states she was switched to ranitidine few weeks ago but this is not reflected on her MAR that is available. She is not having any burping regurgitation or change in her appetite. According to her daughters her bowels move regularly. They're not sure cough and she is requiring polyethylene glycol. She was at rest home and fell in May and sustained large cots to her scalp requiring sutures and she also developed pelvic fracture. She apparently fell while she was in the bathroom unattended. She was at [facility] for 1 week and now being cared for her nursing home.   Current Medications: Outpatient Encounter Prescriptions as of 07/07/2015  Medication Sig  . acetaminophen (Q-PAP) 500 MG tablet Take 1 tablet (500 mg total) by mouth 3 (three) times daily. (Patient taking differently: Take 500 mg by mouth QID. )  . ASPIRIN LOW DOSE 81 MG EC tablet TAKE 1 TABLET BY MOUTH ONCE DAILY.  . citalopram (CELEXA) 20 MG tablet TAKE 1 TABLET BY MOUTH ONCE DAILY.(FOR DEPRESSION)  . Difluprednate (DUREZOL) 0.05 % EMUL Apply 1 drop to eye at bedtime. 1 drop in the right eye  . donepezil (ARICEPT) 5 MG tablet TAKE 1 TABLET BY MOUTH EACH MORNING.  . feeding supplement, RESOURCE BREEZE, (RESOURCE BREEZE) LIQD Take 1 Container by mouth daily.  . memantine (NAMENDA) 10 MG tablet TAKE 1 TABLET BY MOUTH TWICE DAILY.  . Multiple Vitamins-Minerals (MULTIVITAMIN) tablet Take 1 tablet by mouth daily.  Marland Kitchen omeprazole (PRILOSEC) 20 MG capsule TAKE 1 CAPSULE BY MOUTH TWICE DAILY.  Marland Kitchen polyethylene glycol powder (GLYCOLAX/MIRALAX) powder MIX 1 CAPFUL (17G) IN 8 OUNCES OF JUICE/WATER AND DRINK ONCE DAILY AS NEEDED FOR MILD  CONSTIPATION  . polyvinyl alcohol (LIQUIFILM TEARS) 1.4 % ophthalmic solution Place 1 drop into both eyes 3 (three) times daily.  . primidone (MYSOLINE) 50 MG tablet TAKE 1/2 TABLET (25mg ) BY MOUTH AT BEDTIME.  Marland Kitchen Wheat Dextrin (BENEFIBER DRINK MIX) PACK Take 4 g by mouth at bedtime. (Patient taking differently: Take 4 g by mouth at bedtime. MIX ONE TEASPOONFUL IN 8 OUNCES WATER/JUICE)  . [DISCONTINUED] amoxicillin-clavulanate (AUGMENTIN) 500-125 MG per tablet Take 1 tablet (500 mg total) by mouth 2 (two) times daily. (Patient not taking: Reported on 07/07/2015)   No facility-administered encounter medications on file as of 07/07/2015.     Objective: Blood pressure 110/80, pulse 70, temperature 98.3 F (36.8 C), temperature source Oral, resp. rate 18, height 5' (1.524 m). Patient appears to be comfortable in wheelchair. She responds to simple questions such as taking a deep breath or opening her mouth. Conjunctiva is pink. Sclera is nonicteric Oropharyngeal mucosa is normal. No neck masses or thyromegaly noted. Cardiac exam with regular rhythm normal S1 and S2. No murmur or gallop noted. Lungs are clear to auscultation. Abdomen abdomen is soft and nontender without organomegaly or masses. No LE edema or clubbing noted.   Assessment:  #1. Chronic GERD. Patient has large sliding hiatal hernia. She may have been switched to ranitidine and I have no objection. If symptoms relapse she'll need to be back on PPI. #2. Chronic constipation. According to her daughters bowels are moving on regular basis.   Plan:  Patient will continue ranitidine presuming this is  the medication she is on. If symptoms relapse she'll have to be switched back to PPI. Office visit on as-needed basis.

## 2015-07-07 NOTE — Patient Instructions (Signed)
Watch for breakthrough symptoms while on Zantac or ranitidine. Keep HOB at 30 at all times.

## 2015-07-08 DIAGNOSIS — R2681 Unsteadiness on feet: Secondary | ICD-10-CM | POA: Diagnosis not present

## 2015-07-08 DIAGNOSIS — M6281 Muscle weakness (generalized): Secondary | ICD-10-CM | POA: Diagnosis not present

## 2015-07-08 DIAGNOSIS — R293 Abnormal posture: Secondary | ICD-10-CM | POA: Diagnosis not present

## 2015-07-08 DIAGNOSIS — R531 Weakness: Secondary | ICD-10-CM | POA: Diagnosis not present

## 2015-07-08 DIAGNOSIS — G20 Parkinson's disease: Secondary | ICD-10-CM | POA: Diagnosis not present

## 2015-07-09 DIAGNOSIS — R531 Weakness: Secondary | ICD-10-CM | POA: Diagnosis not present

## 2015-07-09 DIAGNOSIS — M6281 Muscle weakness (generalized): Secondary | ICD-10-CM | POA: Diagnosis not present

## 2015-07-09 DIAGNOSIS — G20 Parkinson's disease: Secondary | ICD-10-CM | POA: Diagnosis not present

## 2015-07-09 DIAGNOSIS — R293 Abnormal posture: Secondary | ICD-10-CM | POA: Diagnosis not present

## 2015-07-09 DIAGNOSIS — R2681 Unsteadiness on feet: Secondary | ICD-10-CM | POA: Diagnosis not present

## 2015-07-13 DIAGNOSIS — R531 Weakness: Secondary | ICD-10-CM | POA: Diagnosis not present

## 2015-07-13 DIAGNOSIS — R2681 Unsteadiness on feet: Secondary | ICD-10-CM | POA: Diagnosis not present

## 2015-07-13 DIAGNOSIS — M6281 Muscle weakness (generalized): Secondary | ICD-10-CM | POA: Diagnosis not present

## 2015-07-13 DIAGNOSIS — G20 Parkinson's disease: Secondary | ICD-10-CM | POA: Diagnosis not present

## 2015-07-13 DIAGNOSIS — R293 Abnormal posture: Secondary | ICD-10-CM | POA: Diagnosis not present

## 2015-07-15 DIAGNOSIS — R531 Weakness: Secondary | ICD-10-CM | POA: Diagnosis not present

## 2015-07-15 DIAGNOSIS — M6281 Muscle weakness (generalized): Secondary | ICD-10-CM | POA: Diagnosis not present

## 2015-07-15 DIAGNOSIS — R293 Abnormal posture: Secondary | ICD-10-CM | POA: Diagnosis not present

## 2015-07-15 DIAGNOSIS — G20 Parkinson's disease: Secondary | ICD-10-CM | POA: Diagnosis not present

## 2015-07-15 DIAGNOSIS — R2681 Unsteadiness on feet: Secondary | ICD-10-CM | POA: Diagnosis not present

## 2015-07-16 ENCOUNTER — Encounter (INDEPENDENT_AMBULATORY_CARE_PROVIDER_SITE_OTHER): Payer: Self-pay | Admitting: *Deleted

## 2015-07-16 DIAGNOSIS — R293 Abnormal posture: Secondary | ICD-10-CM | POA: Diagnosis not present

## 2015-07-16 DIAGNOSIS — R531 Weakness: Secondary | ICD-10-CM | POA: Diagnosis not present

## 2015-07-16 DIAGNOSIS — M6281 Muscle weakness (generalized): Secondary | ICD-10-CM | POA: Diagnosis not present

## 2015-07-16 DIAGNOSIS — G20 Parkinson's disease: Secondary | ICD-10-CM | POA: Diagnosis not present

## 2015-07-16 DIAGNOSIS — R2681 Unsteadiness on feet: Secondary | ICD-10-CM | POA: Diagnosis not present

## 2015-07-17 DIAGNOSIS — M6281 Muscle weakness (generalized): Secondary | ICD-10-CM | POA: Diagnosis not present

## 2015-07-17 DIAGNOSIS — R531 Weakness: Secondary | ICD-10-CM | POA: Diagnosis not present

## 2015-07-17 DIAGNOSIS — G20 Parkinson's disease: Secondary | ICD-10-CM | POA: Diagnosis not present

## 2015-07-17 DIAGNOSIS — R2681 Unsteadiness on feet: Secondary | ICD-10-CM | POA: Diagnosis not present

## 2015-07-17 DIAGNOSIS — R293 Abnormal posture: Secondary | ICD-10-CM | POA: Diagnosis not present

## 2015-07-20 DIAGNOSIS — G20 Parkinson's disease: Secondary | ICD-10-CM | POA: Diagnosis not present

## 2015-07-20 DIAGNOSIS — R531 Weakness: Secondary | ICD-10-CM | POA: Diagnosis not present

## 2015-07-20 DIAGNOSIS — R293 Abnormal posture: Secondary | ICD-10-CM | POA: Diagnosis not present

## 2015-07-20 DIAGNOSIS — M6281 Muscle weakness (generalized): Secondary | ICD-10-CM | POA: Diagnosis not present

## 2015-07-20 DIAGNOSIS — R2681 Unsteadiness on feet: Secondary | ICD-10-CM | POA: Diagnosis not present

## 2015-07-21 DIAGNOSIS — G20 Parkinson's disease: Secondary | ICD-10-CM | POA: Diagnosis not present

## 2015-07-21 DIAGNOSIS — R293 Abnormal posture: Secondary | ICD-10-CM | POA: Diagnosis not present

## 2015-07-21 DIAGNOSIS — R2681 Unsteadiness on feet: Secondary | ICD-10-CM | POA: Diagnosis not present

## 2015-07-21 DIAGNOSIS — M6281 Muscle weakness (generalized): Secondary | ICD-10-CM | POA: Diagnosis not present

## 2015-07-21 DIAGNOSIS — R531 Weakness: Secondary | ICD-10-CM | POA: Diagnosis not present

## 2015-07-22 DIAGNOSIS — G20 Parkinson's disease: Secondary | ICD-10-CM | POA: Diagnosis not present

## 2015-07-22 DIAGNOSIS — R2681 Unsteadiness on feet: Secondary | ICD-10-CM | POA: Diagnosis not present

## 2015-07-22 DIAGNOSIS — R293 Abnormal posture: Secondary | ICD-10-CM | POA: Diagnosis not present

## 2015-07-22 DIAGNOSIS — R531 Weakness: Secondary | ICD-10-CM | POA: Diagnosis not present

## 2015-07-22 DIAGNOSIS — M6281 Muscle weakness (generalized): Secondary | ICD-10-CM | POA: Diagnosis not present

## 2015-07-23 DIAGNOSIS — R293 Abnormal posture: Secondary | ICD-10-CM | POA: Diagnosis not present

## 2015-07-23 DIAGNOSIS — G20 Parkinson's disease: Secondary | ICD-10-CM | POA: Diagnosis not present

## 2015-07-23 DIAGNOSIS — R2681 Unsteadiness on feet: Secondary | ICD-10-CM | POA: Diagnosis not present

## 2015-07-23 DIAGNOSIS — M6281 Muscle weakness (generalized): Secondary | ICD-10-CM | POA: Diagnosis not present

## 2015-07-23 DIAGNOSIS — R531 Weakness: Secondary | ICD-10-CM | POA: Diagnosis not present

## 2015-07-24 DIAGNOSIS — R293 Abnormal posture: Secondary | ICD-10-CM | POA: Diagnosis not present

## 2015-07-24 DIAGNOSIS — R531 Weakness: Secondary | ICD-10-CM | POA: Diagnosis not present

## 2015-07-24 DIAGNOSIS — R2681 Unsteadiness on feet: Secondary | ICD-10-CM | POA: Diagnosis not present

## 2015-07-24 DIAGNOSIS — G20 Parkinson's disease: Secondary | ICD-10-CM | POA: Diagnosis not present

## 2015-07-24 DIAGNOSIS — M6281 Muscle weakness (generalized): Secondary | ICD-10-CM | POA: Diagnosis not present

## 2015-07-27 DIAGNOSIS — M6281 Muscle weakness (generalized): Secondary | ICD-10-CM | POA: Diagnosis not present

## 2015-07-27 DIAGNOSIS — R293 Abnormal posture: Secondary | ICD-10-CM | POA: Diagnosis not present

## 2015-07-27 DIAGNOSIS — G20 Parkinson's disease: Secondary | ICD-10-CM | POA: Diagnosis not present

## 2015-07-27 DIAGNOSIS — R2681 Unsteadiness on feet: Secondary | ICD-10-CM | POA: Diagnosis not present

## 2015-07-27 DIAGNOSIS — R531 Weakness: Secondary | ICD-10-CM | POA: Diagnosis not present

## 2015-07-28 DIAGNOSIS — R2681 Unsteadiness on feet: Secondary | ICD-10-CM | POA: Diagnosis not present

## 2015-07-28 DIAGNOSIS — R531 Weakness: Secondary | ICD-10-CM | POA: Diagnosis not present

## 2015-07-28 DIAGNOSIS — R293 Abnormal posture: Secondary | ICD-10-CM | POA: Diagnosis not present

## 2015-07-28 DIAGNOSIS — M6281 Muscle weakness (generalized): Secondary | ICD-10-CM | POA: Diagnosis not present

## 2015-07-28 DIAGNOSIS — G20 Parkinson's disease: Secondary | ICD-10-CM | POA: Diagnosis not present

## 2015-07-29 DIAGNOSIS — R531 Weakness: Secondary | ICD-10-CM | POA: Diagnosis not present

## 2015-07-29 DIAGNOSIS — R2681 Unsteadiness on feet: Secondary | ICD-10-CM | POA: Diagnosis not present

## 2015-07-29 DIAGNOSIS — G20 Parkinson's disease: Secondary | ICD-10-CM | POA: Diagnosis not present

## 2015-07-29 DIAGNOSIS — R293 Abnormal posture: Secondary | ICD-10-CM | POA: Diagnosis not present

## 2015-07-29 DIAGNOSIS — M6281 Muscle weakness (generalized): Secondary | ICD-10-CM | POA: Diagnosis not present

## 2015-07-30 DIAGNOSIS — M6281 Muscle weakness (generalized): Secondary | ICD-10-CM | POA: Diagnosis not present

## 2015-07-30 DIAGNOSIS — G20 Parkinson's disease: Secondary | ICD-10-CM | POA: Diagnosis not present

## 2015-07-30 DIAGNOSIS — R531 Weakness: Secondary | ICD-10-CM | POA: Diagnosis not present

## 2015-07-30 DIAGNOSIS — R293 Abnormal posture: Secondary | ICD-10-CM | POA: Diagnosis not present

## 2015-07-30 DIAGNOSIS — R2681 Unsteadiness on feet: Secondary | ICD-10-CM | POA: Diagnosis not present

## 2015-07-31 DIAGNOSIS — R531 Weakness: Secondary | ICD-10-CM | POA: Diagnosis not present

## 2015-07-31 DIAGNOSIS — M6281 Muscle weakness (generalized): Secondary | ICD-10-CM | POA: Diagnosis not present

## 2015-07-31 DIAGNOSIS — R293 Abnormal posture: Secondary | ICD-10-CM | POA: Diagnosis not present

## 2015-07-31 DIAGNOSIS — R2681 Unsteadiness on feet: Secondary | ICD-10-CM | POA: Diagnosis not present

## 2015-07-31 DIAGNOSIS — G20 Parkinson's disease: Secondary | ICD-10-CM | POA: Diagnosis not present

## 2015-08-01 DIAGNOSIS — R2681 Unsteadiness on feet: Secondary | ICD-10-CM | POA: Diagnosis not present

## 2015-08-01 DIAGNOSIS — R531 Weakness: Secondary | ICD-10-CM | POA: Diagnosis not present

## 2015-08-01 DIAGNOSIS — R293 Abnormal posture: Secondary | ICD-10-CM | POA: Diagnosis not present

## 2015-08-01 DIAGNOSIS — G20 Parkinson's disease: Secondary | ICD-10-CM | POA: Diagnosis not present

## 2015-08-01 DIAGNOSIS — M6281 Muscle weakness (generalized): Secondary | ICD-10-CM | POA: Diagnosis not present

## 2015-08-03 DIAGNOSIS — G20 Parkinson's disease: Secondary | ICD-10-CM | POA: Diagnosis not present

## 2015-08-03 DIAGNOSIS — M6281 Muscle weakness (generalized): Secondary | ICD-10-CM | POA: Diagnosis not present

## 2015-08-03 DIAGNOSIS — R293 Abnormal posture: Secondary | ICD-10-CM | POA: Diagnosis not present

## 2015-08-03 DIAGNOSIS — R531 Weakness: Secondary | ICD-10-CM | POA: Diagnosis not present

## 2015-08-03 DIAGNOSIS — R2681 Unsteadiness on feet: Secondary | ICD-10-CM | POA: Diagnosis not present

## 2015-08-04 DIAGNOSIS — R2681 Unsteadiness on feet: Secondary | ICD-10-CM | POA: Diagnosis not present

## 2015-08-04 DIAGNOSIS — G20 Parkinson's disease: Secondary | ICD-10-CM | POA: Diagnosis not present

## 2015-08-04 DIAGNOSIS — R531 Weakness: Secondary | ICD-10-CM | POA: Diagnosis not present

## 2015-08-04 DIAGNOSIS — R293 Abnormal posture: Secondary | ICD-10-CM | POA: Diagnosis not present

## 2015-08-04 DIAGNOSIS — M6281 Muscle weakness (generalized): Secondary | ICD-10-CM | POA: Diagnosis not present

## 2015-08-05 DIAGNOSIS — R2681 Unsteadiness on feet: Secondary | ICD-10-CM | POA: Diagnosis not present

## 2015-08-05 DIAGNOSIS — R531 Weakness: Secondary | ICD-10-CM | POA: Diagnosis not present

## 2015-08-05 DIAGNOSIS — G20 Parkinson's disease: Secondary | ICD-10-CM | POA: Diagnosis not present

## 2015-08-05 DIAGNOSIS — M6281 Muscle weakness (generalized): Secondary | ICD-10-CM | POA: Diagnosis not present

## 2015-08-05 DIAGNOSIS — R293 Abnormal posture: Secondary | ICD-10-CM | POA: Diagnosis not present

## 2015-08-07 DIAGNOSIS — M6281 Muscle weakness (generalized): Secondary | ICD-10-CM | POA: Diagnosis not present

## 2015-08-07 DIAGNOSIS — R2681 Unsteadiness on feet: Secondary | ICD-10-CM | POA: Diagnosis not present

## 2015-08-07 DIAGNOSIS — G20 Parkinson's disease: Secondary | ICD-10-CM | POA: Diagnosis not present

## 2015-08-07 DIAGNOSIS — R293 Abnormal posture: Secondary | ICD-10-CM | POA: Diagnosis not present

## 2015-08-07 DIAGNOSIS — R531 Weakness: Secondary | ICD-10-CM | POA: Diagnosis not present

## 2015-08-10 DIAGNOSIS — M6281 Muscle weakness (generalized): Secondary | ICD-10-CM | POA: Diagnosis not present

## 2015-08-10 DIAGNOSIS — G20 Parkinson's disease: Secondary | ICD-10-CM | POA: Diagnosis not present

## 2015-08-10 DIAGNOSIS — R531 Weakness: Secondary | ICD-10-CM | POA: Diagnosis not present

## 2015-08-10 DIAGNOSIS — R293 Abnormal posture: Secondary | ICD-10-CM | POA: Diagnosis not present

## 2015-08-10 DIAGNOSIS — R2681 Unsteadiness on feet: Secondary | ICD-10-CM | POA: Diagnosis not present

## 2015-08-11 DIAGNOSIS — R293 Abnormal posture: Secondary | ICD-10-CM | POA: Diagnosis not present

## 2015-08-11 DIAGNOSIS — R2681 Unsteadiness on feet: Secondary | ICD-10-CM | POA: Diagnosis not present

## 2015-08-11 DIAGNOSIS — M6281 Muscle weakness (generalized): Secondary | ICD-10-CM | POA: Diagnosis not present

## 2015-08-11 DIAGNOSIS — G20 Parkinson's disease: Secondary | ICD-10-CM | POA: Diagnosis not present

## 2015-08-11 DIAGNOSIS — R531 Weakness: Secondary | ICD-10-CM | POA: Diagnosis not present

## 2015-09-03 DIAGNOSIS — R1312 Dysphagia, oropharyngeal phase: Secondary | ICD-10-CM | POA: Diagnosis not present

## 2015-09-03 DIAGNOSIS — Z955 Presence of coronary angioplasty implant and graft: Secondary | ICD-10-CM | POA: Diagnosis not present

## 2015-09-03 DIAGNOSIS — I1310 Hypertensive heart and chronic kidney disease without heart failure, with stage 1 through stage 4 chronic kidney disease, or unspecified chronic kidney disease: Secondary | ICD-10-CM | POA: Diagnosis not present

## 2015-09-03 DIAGNOSIS — M6281 Muscle weakness (generalized): Secondary | ICD-10-CM | POA: Diagnosis not present

## 2015-09-03 DIAGNOSIS — K469 Unspecified abdominal hernia without obstruction or gangrene: Secondary | ICD-10-CM | POA: Diagnosis not present

## 2015-09-03 DIAGNOSIS — D509 Iron deficiency anemia, unspecified: Secondary | ICD-10-CM | POA: Diagnosis not present

## 2015-09-03 DIAGNOSIS — M810 Age-related osteoporosis without current pathological fracture: Secondary | ICD-10-CM | POA: Diagnosis not present

## 2015-09-03 DIAGNOSIS — F0280 Dementia in other diseases classified elsewhere without behavioral disturbance: Secondary | ICD-10-CM | POA: Diagnosis not present

## 2015-09-03 DIAGNOSIS — S0990XD Unspecified injury of head, subsequent encounter: Secondary | ICD-10-CM | POA: Diagnosis not present

## 2015-09-03 DIAGNOSIS — E119 Type 2 diabetes mellitus without complications: Secondary | ICD-10-CM | POA: Diagnosis not present

## 2015-09-03 DIAGNOSIS — G20 Parkinson's disease: Secondary | ICD-10-CM | POA: Diagnosis not present

## 2015-09-03 DIAGNOSIS — E785 Hyperlipidemia, unspecified: Secondary | ICD-10-CM | POA: Diagnosis not present

## 2015-09-03 DIAGNOSIS — N183 Chronic kidney disease, stage 3 (moderate): Secondary | ICD-10-CM | POA: Diagnosis not present

## 2015-09-03 DIAGNOSIS — G309 Alzheimer's disease, unspecified: Secondary | ICD-10-CM | POA: Diagnosis not present

## 2015-09-03 DIAGNOSIS — K219 Gastro-esophageal reflux disease without esophagitis: Secondary | ICD-10-CM | POA: Diagnosis not present

## 2015-09-03 DIAGNOSIS — K5790 Diverticulosis of intestine, part unspecified, without perforation or abscess without bleeding: Secondary | ICD-10-CM | POA: Diagnosis not present

## 2015-09-03 DIAGNOSIS — F329 Major depressive disorder, single episode, unspecified: Secondary | ICD-10-CM | POA: Diagnosis not present

## 2015-09-03 DIAGNOSIS — I2510 Atherosclerotic heart disease of native coronary artery without angina pectoris: Secondary | ICD-10-CM | POA: Diagnosis not present

## 2015-09-03 DIAGNOSIS — R2681 Unsteadiness on feet: Secondary | ICD-10-CM | POA: Diagnosis not present

## 2015-09-03 DIAGNOSIS — R609 Edema, unspecified: Secondary | ICD-10-CM | POA: Diagnosis not present

## 2015-09-03 DIAGNOSIS — Z7982 Long term (current) use of aspirin: Secondary | ICD-10-CM | POA: Diagnosis not present

## 2015-09-03 DIAGNOSIS — Z8673 Personal history of transient ischemic attack (TIA), and cerebral infarction without residual deficits: Secondary | ICD-10-CM | POA: Diagnosis not present

## 2015-09-08 DIAGNOSIS — R1312 Dysphagia, oropharyngeal phase: Secondary | ICD-10-CM | POA: Diagnosis not present

## 2015-09-08 DIAGNOSIS — G20 Parkinson's disease: Secondary | ICD-10-CM | POA: Diagnosis not present

## 2015-09-08 DIAGNOSIS — Z8673 Personal history of transient ischemic attack (TIA), and cerebral infarction without residual deficits: Secondary | ICD-10-CM | POA: Diagnosis not present

## 2015-09-08 DIAGNOSIS — M6281 Muscle weakness (generalized): Secondary | ICD-10-CM | POA: Diagnosis not present

## 2015-09-08 DIAGNOSIS — I1310 Hypertensive heart and chronic kidney disease without heart failure, with stage 1 through stage 4 chronic kidney disease, or unspecified chronic kidney disease: Secondary | ICD-10-CM | POA: Diagnosis not present

## 2015-09-08 DIAGNOSIS — R2681 Unsteadiness on feet: Secondary | ICD-10-CM | POA: Diagnosis not present

## 2015-09-09 DIAGNOSIS — Z8673 Personal history of transient ischemic attack (TIA), and cerebral infarction without residual deficits: Secondary | ICD-10-CM | POA: Diagnosis not present

## 2015-09-09 DIAGNOSIS — R2681 Unsteadiness on feet: Secondary | ICD-10-CM | POA: Diagnosis not present

## 2015-09-09 DIAGNOSIS — I1310 Hypertensive heart and chronic kidney disease without heart failure, with stage 1 through stage 4 chronic kidney disease, or unspecified chronic kidney disease: Secondary | ICD-10-CM | POA: Diagnosis not present

## 2015-09-09 DIAGNOSIS — R1312 Dysphagia, oropharyngeal phase: Secondary | ICD-10-CM | POA: Diagnosis not present

## 2015-09-09 DIAGNOSIS — G20 Parkinson's disease: Secondary | ICD-10-CM | POA: Diagnosis not present

## 2015-09-09 DIAGNOSIS — M6281 Muscle weakness (generalized): Secondary | ICD-10-CM | POA: Diagnosis not present

## 2015-09-10 DIAGNOSIS — M6281 Muscle weakness (generalized): Secondary | ICD-10-CM | POA: Diagnosis not present

## 2015-09-10 DIAGNOSIS — Z8673 Personal history of transient ischemic attack (TIA), and cerebral infarction without residual deficits: Secondary | ICD-10-CM | POA: Diagnosis not present

## 2015-09-10 DIAGNOSIS — G20 Parkinson's disease: Secondary | ICD-10-CM | POA: Diagnosis not present

## 2015-09-10 DIAGNOSIS — R2681 Unsteadiness on feet: Secondary | ICD-10-CM | POA: Diagnosis not present

## 2015-09-10 DIAGNOSIS — R1312 Dysphagia, oropharyngeal phase: Secondary | ICD-10-CM | POA: Diagnosis not present

## 2015-09-10 DIAGNOSIS — I1310 Hypertensive heart and chronic kidney disease without heart failure, with stage 1 through stage 4 chronic kidney disease, or unspecified chronic kidney disease: Secondary | ICD-10-CM | POA: Diagnosis not present

## 2015-09-15 DIAGNOSIS — R2681 Unsteadiness on feet: Secondary | ICD-10-CM | POA: Diagnosis not present

## 2015-09-15 DIAGNOSIS — I1310 Hypertensive heart and chronic kidney disease without heart failure, with stage 1 through stage 4 chronic kidney disease, or unspecified chronic kidney disease: Secondary | ICD-10-CM | POA: Diagnosis not present

## 2015-09-15 DIAGNOSIS — Z8673 Personal history of transient ischemic attack (TIA), and cerebral infarction without residual deficits: Secondary | ICD-10-CM | POA: Diagnosis not present

## 2015-09-15 DIAGNOSIS — R1312 Dysphagia, oropharyngeal phase: Secondary | ICD-10-CM | POA: Diagnosis not present

## 2015-09-15 DIAGNOSIS — M6281 Muscle weakness (generalized): Secondary | ICD-10-CM | POA: Diagnosis not present

## 2015-09-15 DIAGNOSIS — G20 Parkinson's disease: Secondary | ICD-10-CM | POA: Diagnosis not present

## 2015-09-17 DIAGNOSIS — G20 Parkinson's disease: Secondary | ICD-10-CM | POA: Diagnosis not present

## 2015-09-17 DIAGNOSIS — R1312 Dysphagia, oropharyngeal phase: Secondary | ICD-10-CM | POA: Diagnosis not present

## 2015-09-17 DIAGNOSIS — Z8673 Personal history of transient ischemic attack (TIA), and cerebral infarction without residual deficits: Secondary | ICD-10-CM | POA: Diagnosis not present

## 2015-09-17 DIAGNOSIS — I1310 Hypertensive heart and chronic kidney disease without heart failure, with stage 1 through stage 4 chronic kidney disease, or unspecified chronic kidney disease: Secondary | ICD-10-CM | POA: Diagnosis not present

## 2015-09-17 DIAGNOSIS — M6281 Muscle weakness (generalized): Secondary | ICD-10-CM | POA: Diagnosis not present

## 2015-09-17 DIAGNOSIS — R2681 Unsteadiness on feet: Secondary | ICD-10-CM | POA: Diagnosis not present

## 2015-09-18 DIAGNOSIS — I1310 Hypertensive heart and chronic kidney disease without heart failure, with stage 1 through stage 4 chronic kidney disease, or unspecified chronic kidney disease: Secondary | ICD-10-CM | POA: Diagnosis not present

## 2015-09-18 DIAGNOSIS — R1312 Dysphagia, oropharyngeal phase: Secondary | ICD-10-CM | POA: Diagnosis not present

## 2015-09-18 DIAGNOSIS — Z8673 Personal history of transient ischemic attack (TIA), and cerebral infarction without residual deficits: Secondary | ICD-10-CM | POA: Diagnosis not present

## 2015-09-18 DIAGNOSIS — G20 Parkinson's disease: Secondary | ICD-10-CM | POA: Diagnosis not present

## 2015-09-18 DIAGNOSIS — R2681 Unsteadiness on feet: Secondary | ICD-10-CM | POA: Diagnosis not present

## 2015-09-18 DIAGNOSIS — M6281 Muscle weakness (generalized): Secondary | ICD-10-CM | POA: Diagnosis not present

## 2015-10-21 DIAGNOSIS — N183 Chronic kidney disease, stage 3 (moderate): Secondary | ICD-10-CM | POA: Diagnosis not present

## 2015-10-21 DIAGNOSIS — Z79899 Other long term (current) drug therapy: Secondary | ICD-10-CM | POA: Diagnosis not present

## 2015-11-05 DIAGNOSIS — N390 Urinary tract infection, site not specified: Secondary | ICD-10-CM | POA: Diagnosis not present

## 2015-11-05 DIAGNOSIS — N179 Acute kidney failure, unspecified: Secondary | ICD-10-CM | POA: Diagnosis not present

## 2015-11-05 DIAGNOSIS — D696 Thrombocytopenia, unspecified: Secondary | ICD-10-CM | POA: Diagnosis not present

## 2015-11-05 DIAGNOSIS — N183 Chronic kidney disease, stage 3 (moderate): Secondary | ICD-10-CM | POA: Diagnosis not present

## 2015-12-22 DIAGNOSIS — R05 Cough: Secondary | ICD-10-CM | POA: Diagnosis not present

## 2015-12-22 DIAGNOSIS — R0989 Other specified symptoms and signs involving the circulatory and respiratory systems: Secondary | ICD-10-CM | POA: Diagnosis not present

## 2015-12-22 DIAGNOSIS — Z9981 Dependence on supplemental oxygen: Secondary | ICD-10-CM | POA: Diagnosis not present

## 2015-12-22 DIAGNOSIS — J22 Unspecified acute lower respiratory infection: Secondary | ICD-10-CM | POA: Diagnosis not present

## 2015-12-22 DIAGNOSIS — R509 Fever, unspecified: Secondary | ICD-10-CM | POA: Diagnosis not present

## 2016-01-21 DIAGNOSIS — M6281 Muscle weakness (generalized): Secondary | ICD-10-CM | POA: Diagnosis not present

## 2016-01-21 DIAGNOSIS — F0280 Dementia in other diseases classified elsewhere without behavioral disturbance: Secondary | ICD-10-CM | POA: Diagnosis not present

## 2016-01-21 DIAGNOSIS — G20 Parkinson's disease: Secondary | ICD-10-CM | POA: Diagnosis not present

## 2016-01-21 DIAGNOSIS — Z7982 Long term (current) use of aspirin: Secondary | ICD-10-CM | POA: Diagnosis not present

## 2016-01-21 DIAGNOSIS — R609 Edema, unspecified: Secondary | ICD-10-CM | POA: Diagnosis not present

## 2016-01-21 DIAGNOSIS — G309 Alzheimer's disease, unspecified: Secondary | ICD-10-CM | POA: Diagnosis not present

## 2016-01-21 DIAGNOSIS — N183 Chronic kidney disease, stage 3 (moderate): Secondary | ICD-10-CM | POA: Diagnosis not present

## 2016-01-21 DIAGNOSIS — I2510 Atherosclerotic heart disease of native coronary artery without angina pectoris: Secondary | ICD-10-CM | POA: Diagnosis not present

## 2016-01-21 DIAGNOSIS — K5790 Diverticulosis of intestine, part unspecified, without perforation or abscess without bleeding: Secondary | ICD-10-CM | POA: Diagnosis not present

## 2016-01-21 DIAGNOSIS — M810 Age-related osteoporosis without current pathological fracture: Secondary | ICD-10-CM | POA: Diagnosis not present

## 2016-01-21 DIAGNOSIS — R29898 Other symptoms and signs involving the musculoskeletal system: Secondary | ICD-10-CM | POA: Diagnosis not present

## 2016-01-21 DIAGNOSIS — K469 Unspecified abdominal hernia without obstruction or gangrene: Secondary | ICD-10-CM | POA: Diagnosis not present

## 2016-01-21 DIAGNOSIS — K219 Gastro-esophageal reflux disease without esophagitis: Secondary | ICD-10-CM | POA: Diagnosis not present

## 2016-01-21 DIAGNOSIS — D509 Iron deficiency anemia, unspecified: Secondary | ICD-10-CM | POA: Diagnosis not present

## 2016-01-21 DIAGNOSIS — R293 Abnormal posture: Secondary | ICD-10-CM | POA: Diagnosis not present

## 2016-01-21 DIAGNOSIS — F329 Major depressive disorder, single episode, unspecified: Secondary | ICD-10-CM | POA: Diagnosis not present

## 2016-01-21 DIAGNOSIS — E119 Type 2 diabetes mellitus without complications: Secondary | ICD-10-CM | POA: Diagnosis not present

## 2016-01-21 DIAGNOSIS — E785 Hyperlipidemia, unspecified: Secondary | ICD-10-CM | POA: Diagnosis not present

## 2016-01-21 DIAGNOSIS — Z955 Presence of coronary angioplasty implant and graft: Secondary | ICD-10-CM | POA: Diagnosis not present

## 2016-01-21 DIAGNOSIS — S0990XD Unspecified injury of head, subsequent encounter: Secondary | ICD-10-CM | POA: Diagnosis not present

## 2016-01-25 DIAGNOSIS — M6281 Muscle weakness (generalized): Secondary | ICD-10-CM | POA: Diagnosis not present

## 2016-01-25 DIAGNOSIS — G20 Parkinson's disease: Secondary | ICD-10-CM | POA: Diagnosis not present

## 2016-01-25 DIAGNOSIS — E785 Hyperlipidemia, unspecified: Secondary | ICD-10-CM | POA: Diagnosis not present

## 2016-01-25 DIAGNOSIS — N183 Chronic kidney disease, stage 3 (moderate): Secondary | ICD-10-CM | POA: Diagnosis not present

## 2016-01-25 DIAGNOSIS — R293 Abnormal posture: Secondary | ICD-10-CM | POA: Diagnosis not present

## 2016-01-25 DIAGNOSIS — R29898 Other symptoms and signs involving the musculoskeletal system: Secondary | ICD-10-CM | POA: Diagnosis not present

## 2016-01-26 DIAGNOSIS — R293 Abnormal posture: Secondary | ICD-10-CM | POA: Diagnosis not present

## 2016-01-26 DIAGNOSIS — R29898 Other symptoms and signs involving the musculoskeletal system: Secondary | ICD-10-CM | POA: Diagnosis not present

## 2016-01-26 DIAGNOSIS — G20 Parkinson's disease: Secondary | ICD-10-CM | POA: Diagnosis not present

## 2016-01-26 DIAGNOSIS — M6281 Muscle weakness (generalized): Secondary | ICD-10-CM | POA: Diagnosis not present

## 2016-01-26 DIAGNOSIS — E785 Hyperlipidemia, unspecified: Secondary | ICD-10-CM | POA: Diagnosis not present

## 2016-01-26 DIAGNOSIS — N183 Chronic kidney disease, stage 3 (moderate): Secondary | ICD-10-CM | POA: Diagnosis not present

## 2016-01-28 DIAGNOSIS — G20 Parkinson's disease: Secondary | ICD-10-CM | POA: Diagnosis not present

## 2016-01-28 DIAGNOSIS — R29898 Other symptoms and signs involving the musculoskeletal system: Secondary | ICD-10-CM | POA: Diagnosis not present

## 2016-01-28 DIAGNOSIS — E785 Hyperlipidemia, unspecified: Secondary | ICD-10-CM | POA: Diagnosis not present

## 2016-01-28 DIAGNOSIS — N183 Chronic kidney disease, stage 3 (moderate): Secondary | ICD-10-CM | POA: Diagnosis not present

## 2016-01-28 DIAGNOSIS — R293 Abnormal posture: Secondary | ICD-10-CM | POA: Diagnosis not present

## 2016-01-28 DIAGNOSIS — M6281 Muscle weakness (generalized): Secondary | ICD-10-CM | POA: Diagnosis not present

## 2016-01-29 DIAGNOSIS — R293 Abnormal posture: Secondary | ICD-10-CM | POA: Diagnosis not present

## 2016-01-29 DIAGNOSIS — N183 Chronic kidney disease, stage 3 (moderate): Secondary | ICD-10-CM | POA: Diagnosis not present

## 2016-01-29 DIAGNOSIS — E785 Hyperlipidemia, unspecified: Secondary | ICD-10-CM | POA: Diagnosis not present

## 2016-01-29 DIAGNOSIS — R29898 Other symptoms and signs involving the musculoskeletal system: Secondary | ICD-10-CM | POA: Diagnosis not present

## 2016-01-29 DIAGNOSIS — G20 Parkinson's disease: Secondary | ICD-10-CM | POA: Diagnosis not present

## 2016-01-29 DIAGNOSIS — M6281 Muscle weakness (generalized): Secondary | ICD-10-CM | POA: Diagnosis not present

## 2016-02-01 DIAGNOSIS — R293 Abnormal posture: Secondary | ICD-10-CM | POA: Diagnosis not present

## 2016-02-01 DIAGNOSIS — E785 Hyperlipidemia, unspecified: Secondary | ICD-10-CM | POA: Diagnosis not present

## 2016-02-01 DIAGNOSIS — N183 Chronic kidney disease, stage 3 (moderate): Secondary | ICD-10-CM | POA: Diagnosis not present

## 2016-02-01 DIAGNOSIS — M6281 Muscle weakness (generalized): Secondary | ICD-10-CM | POA: Diagnosis not present

## 2016-02-01 DIAGNOSIS — G20 Parkinson's disease: Secondary | ICD-10-CM | POA: Diagnosis not present

## 2016-02-01 DIAGNOSIS — R29898 Other symptoms and signs involving the musculoskeletal system: Secondary | ICD-10-CM | POA: Diagnosis not present

## 2016-02-02 DIAGNOSIS — N183 Chronic kidney disease, stage 3 (moderate): Secondary | ICD-10-CM | POA: Diagnosis not present

## 2016-02-02 DIAGNOSIS — R29898 Other symptoms and signs involving the musculoskeletal system: Secondary | ICD-10-CM | POA: Diagnosis not present

## 2016-02-02 DIAGNOSIS — R293 Abnormal posture: Secondary | ICD-10-CM | POA: Diagnosis not present

## 2016-02-02 DIAGNOSIS — G20 Parkinson's disease: Secondary | ICD-10-CM | POA: Diagnosis not present

## 2016-02-02 DIAGNOSIS — M6281 Muscle weakness (generalized): Secondary | ICD-10-CM | POA: Diagnosis not present

## 2016-02-02 DIAGNOSIS — E785 Hyperlipidemia, unspecified: Secondary | ICD-10-CM | POA: Diagnosis not present

## 2016-02-03 DIAGNOSIS — G20 Parkinson's disease: Secondary | ICD-10-CM | POA: Diagnosis not present

## 2016-02-03 DIAGNOSIS — R29898 Other symptoms and signs involving the musculoskeletal system: Secondary | ICD-10-CM | POA: Diagnosis not present

## 2016-02-03 DIAGNOSIS — N183 Chronic kidney disease, stage 3 (moderate): Secondary | ICD-10-CM | POA: Diagnosis not present

## 2016-02-03 DIAGNOSIS — E785 Hyperlipidemia, unspecified: Secondary | ICD-10-CM | POA: Diagnosis not present

## 2016-02-03 DIAGNOSIS — M6281 Muscle weakness (generalized): Secondary | ICD-10-CM | POA: Diagnosis not present

## 2016-02-03 DIAGNOSIS — R293 Abnormal posture: Secondary | ICD-10-CM | POA: Diagnosis not present

## 2016-03-04 DIAGNOSIS — E119 Type 2 diabetes mellitus without complications: Secondary | ICD-10-CM | POA: Diagnosis not present

## 2016-03-04 DIAGNOSIS — G20 Parkinson's disease: Secondary | ICD-10-CM | POA: Diagnosis not present

## 2016-03-04 DIAGNOSIS — I1310 Hypertensive heart and chronic kidney disease without heart failure, with stage 1 through stage 4 chronic kidney disease, or unspecified chronic kidney disease: Secondary | ICD-10-CM | POA: Diagnosis not present

## 2016-03-04 DIAGNOSIS — F0280 Dementia in other diseases classified elsewhere without behavioral disturbance: Secondary | ICD-10-CM | POA: Diagnosis not present

## 2016-04-14 DIAGNOSIS — G20 Parkinson's disease: Secondary | ICD-10-CM | POA: Diagnosis not present

## 2016-04-14 DIAGNOSIS — N183 Chronic kidney disease, stage 3 (moderate): Secondary | ICD-10-CM | POA: Diagnosis not present

## 2016-04-14 DIAGNOSIS — F329 Major depressive disorder, single episode, unspecified: Secondary | ICD-10-CM | POA: Diagnosis not present

## 2016-04-14 DIAGNOSIS — M6281 Muscle weakness (generalized): Secondary | ICD-10-CM | POA: Diagnosis not present

## 2016-04-14 DIAGNOSIS — Z955 Presence of coronary angioplasty implant and graft: Secondary | ICD-10-CM | POA: Diagnosis not present

## 2016-04-14 DIAGNOSIS — I1310 Hypertensive heart and chronic kidney disease without heart failure, with stage 1 through stage 4 chronic kidney disease, or unspecified chronic kidney disease: Secondary | ICD-10-CM | POA: Diagnosis not present

## 2016-04-14 DIAGNOSIS — S0990XD Unspecified injury of head, subsequent encounter: Secondary | ICD-10-CM | POA: Diagnosis not present

## 2016-04-14 DIAGNOSIS — F0280 Dementia in other diseases classified elsewhere without behavioral disturbance: Secondary | ICD-10-CM | POA: Diagnosis not present

## 2016-04-14 DIAGNOSIS — E119 Type 2 diabetes mellitus without complications: Secondary | ICD-10-CM | POA: Diagnosis not present

## 2016-04-14 DIAGNOSIS — R278 Other lack of coordination: Secondary | ICD-10-CM | POA: Diagnosis not present

## 2016-04-14 DIAGNOSIS — K5790 Diverticulosis of intestine, part unspecified, without perforation or abscess without bleeding: Secondary | ICD-10-CM | POA: Diagnosis not present

## 2016-04-14 DIAGNOSIS — G309 Alzheimer's disease, unspecified: Secondary | ICD-10-CM | POA: Diagnosis not present

## 2016-04-14 DIAGNOSIS — R609 Edema, unspecified: Secondary | ICD-10-CM | POA: Diagnosis not present

## 2016-04-14 DIAGNOSIS — Z7982 Long term (current) use of aspirin: Secondary | ICD-10-CM | POA: Diagnosis not present

## 2016-04-14 DIAGNOSIS — K219 Gastro-esophageal reflux disease without esophagitis: Secondary | ICD-10-CM | POA: Diagnosis not present

## 2016-04-14 DIAGNOSIS — D509 Iron deficiency anemia, unspecified: Secondary | ICD-10-CM | POA: Diagnosis not present

## 2016-04-14 DIAGNOSIS — K469 Unspecified abdominal hernia without obstruction or gangrene: Secondary | ICD-10-CM | POA: Diagnosis not present

## 2016-04-14 DIAGNOSIS — E785 Hyperlipidemia, unspecified: Secondary | ICD-10-CM | POA: Diagnosis not present

## 2016-04-14 DIAGNOSIS — M810 Age-related osteoporosis without current pathological fracture: Secondary | ICD-10-CM | POA: Diagnosis not present

## 2016-04-14 DIAGNOSIS — I2510 Atherosclerotic heart disease of native coronary artery without angina pectoris: Secondary | ICD-10-CM | POA: Diagnosis not present

## 2016-04-15 DIAGNOSIS — M6281 Muscle weakness (generalized): Secondary | ICD-10-CM | POA: Diagnosis not present

## 2016-04-15 DIAGNOSIS — N183 Chronic kidney disease, stage 3 (moderate): Secondary | ICD-10-CM | POA: Diagnosis not present

## 2016-04-15 DIAGNOSIS — R278 Other lack of coordination: Secondary | ICD-10-CM | POA: Diagnosis not present

## 2016-04-15 DIAGNOSIS — G20 Parkinson's disease: Secondary | ICD-10-CM | POA: Diagnosis not present

## 2016-04-15 DIAGNOSIS — E785 Hyperlipidemia, unspecified: Secondary | ICD-10-CM | POA: Diagnosis not present

## 2016-04-15 DIAGNOSIS — I1310 Hypertensive heart and chronic kidney disease without heart failure, with stage 1 through stage 4 chronic kidney disease, or unspecified chronic kidney disease: Secondary | ICD-10-CM | POA: Diagnosis not present

## 2016-04-18 DIAGNOSIS — E785 Hyperlipidemia, unspecified: Secondary | ICD-10-CM | POA: Diagnosis not present

## 2016-04-18 DIAGNOSIS — R278 Other lack of coordination: Secondary | ICD-10-CM | POA: Diagnosis not present

## 2016-04-18 DIAGNOSIS — G20 Parkinson's disease: Secondary | ICD-10-CM | POA: Diagnosis not present

## 2016-04-18 DIAGNOSIS — N183 Chronic kidney disease, stage 3 (moderate): Secondary | ICD-10-CM | POA: Diagnosis not present

## 2016-04-18 DIAGNOSIS — I1310 Hypertensive heart and chronic kidney disease without heart failure, with stage 1 through stage 4 chronic kidney disease, or unspecified chronic kidney disease: Secondary | ICD-10-CM | POA: Diagnosis not present

## 2016-04-18 DIAGNOSIS — M6281 Muscle weakness (generalized): Secondary | ICD-10-CM | POA: Diagnosis not present

## 2016-04-19 DIAGNOSIS — M6281 Muscle weakness (generalized): Secondary | ICD-10-CM | POA: Diagnosis not present

## 2016-04-19 DIAGNOSIS — I1310 Hypertensive heart and chronic kidney disease without heart failure, with stage 1 through stage 4 chronic kidney disease, or unspecified chronic kidney disease: Secondary | ICD-10-CM | POA: Diagnosis not present

## 2016-04-19 DIAGNOSIS — E785 Hyperlipidemia, unspecified: Secondary | ICD-10-CM | POA: Diagnosis not present

## 2016-04-19 DIAGNOSIS — G20 Parkinson's disease: Secondary | ICD-10-CM | POA: Diagnosis not present

## 2016-04-19 DIAGNOSIS — R278 Other lack of coordination: Secondary | ICD-10-CM | POA: Diagnosis not present

## 2016-04-19 DIAGNOSIS — N183 Chronic kidney disease, stage 3 (moderate): Secondary | ICD-10-CM | POA: Diagnosis not present

## 2016-04-20 DIAGNOSIS — E785 Hyperlipidemia, unspecified: Secondary | ICD-10-CM | POA: Diagnosis not present

## 2016-04-20 DIAGNOSIS — I1310 Hypertensive heart and chronic kidney disease without heart failure, with stage 1 through stage 4 chronic kidney disease, or unspecified chronic kidney disease: Secondary | ICD-10-CM | POA: Diagnosis not present

## 2016-04-20 DIAGNOSIS — N183 Chronic kidney disease, stage 3 (moderate): Secondary | ICD-10-CM | POA: Diagnosis not present

## 2016-04-20 DIAGNOSIS — M6281 Muscle weakness (generalized): Secondary | ICD-10-CM | POA: Diagnosis not present

## 2016-04-20 DIAGNOSIS — R278 Other lack of coordination: Secondary | ICD-10-CM | POA: Diagnosis not present

## 2016-04-20 DIAGNOSIS — G20 Parkinson's disease: Secondary | ICD-10-CM | POA: Diagnosis not present

## 2016-04-21 DIAGNOSIS — E785 Hyperlipidemia, unspecified: Secondary | ICD-10-CM | POA: Diagnosis not present

## 2016-04-21 DIAGNOSIS — K469 Unspecified abdominal hernia without obstruction or gangrene: Secondary | ICD-10-CM | POA: Diagnosis not present

## 2016-04-21 DIAGNOSIS — N183 Chronic kidney disease, stage 3 (moderate): Secondary | ICD-10-CM | POA: Diagnosis not present

## 2016-04-21 DIAGNOSIS — Z7982 Long term (current) use of aspirin: Secondary | ICD-10-CM | POA: Diagnosis not present

## 2016-04-21 DIAGNOSIS — E119 Type 2 diabetes mellitus without complications: Secondary | ICD-10-CM | POA: Diagnosis not present

## 2016-04-21 DIAGNOSIS — R609 Edema, unspecified: Secondary | ICD-10-CM | POA: Diagnosis not present

## 2016-04-21 DIAGNOSIS — F0280 Dementia in other diseases classified elsewhere without behavioral disturbance: Secondary | ICD-10-CM | POA: Diagnosis not present

## 2016-04-21 DIAGNOSIS — K219 Gastro-esophageal reflux disease without esophagitis: Secondary | ICD-10-CM | POA: Diagnosis not present

## 2016-04-21 DIAGNOSIS — K5790 Diverticulosis of intestine, part unspecified, without perforation or abscess without bleeding: Secondary | ICD-10-CM | POA: Diagnosis not present

## 2016-04-21 DIAGNOSIS — R278 Other lack of coordination: Secondary | ICD-10-CM | POA: Diagnosis not present

## 2016-04-21 DIAGNOSIS — M6281 Muscle weakness (generalized): Secondary | ICD-10-CM | POA: Diagnosis not present

## 2016-04-21 DIAGNOSIS — Z955 Presence of coronary angioplasty implant and graft: Secondary | ICD-10-CM | POA: Diagnosis not present

## 2016-04-21 DIAGNOSIS — I1310 Hypertensive heart and chronic kidney disease without heart failure, with stage 1 through stage 4 chronic kidney disease, or unspecified chronic kidney disease: Secondary | ICD-10-CM | POA: Diagnosis not present

## 2016-04-21 DIAGNOSIS — G309 Alzheimer's disease, unspecified: Secondary | ICD-10-CM | POA: Diagnosis not present

## 2016-04-21 DIAGNOSIS — D509 Iron deficiency anemia, unspecified: Secondary | ICD-10-CM | POA: Diagnosis not present

## 2016-04-21 DIAGNOSIS — S0990XD Unspecified injury of head, subsequent encounter: Secondary | ICD-10-CM | POA: Diagnosis not present

## 2016-04-21 DIAGNOSIS — I2510 Atherosclerotic heart disease of native coronary artery without angina pectoris: Secondary | ICD-10-CM | POA: Diagnosis not present

## 2016-04-21 DIAGNOSIS — F329 Major depressive disorder, single episode, unspecified: Secondary | ICD-10-CM | POA: Diagnosis not present

## 2016-04-21 DIAGNOSIS — G20 Parkinson's disease: Secondary | ICD-10-CM | POA: Diagnosis not present

## 2016-04-21 DIAGNOSIS — M810 Age-related osteoporosis without current pathological fracture: Secondary | ICD-10-CM | POA: Diagnosis not present

## 2016-04-22 DIAGNOSIS — R278 Other lack of coordination: Secondary | ICD-10-CM | POA: Diagnosis not present

## 2016-04-22 DIAGNOSIS — G20 Parkinson's disease: Secondary | ICD-10-CM | POA: Diagnosis not present

## 2016-04-22 DIAGNOSIS — N183 Chronic kidney disease, stage 3 (moderate): Secondary | ICD-10-CM | POA: Diagnosis not present

## 2016-04-22 DIAGNOSIS — I1310 Hypertensive heart and chronic kidney disease without heart failure, with stage 1 through stage 4 chronic kidney disease, or unspecified chronic kidney disease: Secondary | ICD-10-CM | POA: Diagnosis not present

## 2016-04-22 DIAGNOSIS — M6281 Muscle weakness (generalized): Secondary | ICD-10-CM | POA: Diagnosis not present

## 2016-04-22 DIAGNOSIS — E785 Hyperlipidemia, unspecified: Secondary | ICD-10-CM | POA: Diagnosis not present

## 2016-04-25 DIAGNOSIS — M6281 Muscle weakness (generalized): Secondary | ICD-10-CM | POA: Diagnosis not present

## 2016-04-25 DIAGNOSIS — E785 Hyperlipidemia, unspecified: Secondary | ICD-10-CM | POA: Diagnosis not present

## 2016-04-25 DIAGNOSIS — G20 Parkinson's disease: Secondary | ICD-10-CM | POA: Diagnosis not present

## 2016-04-25 DIAGNOSIS — R278 Other lack of coordination: Secondary | ICD-10-CM | POA: Diagnosis not present

## 2016-04-25 DIAGNOSIS — N183 Chronic kidney disease, stage 3 (moderate): Secondary | ICD-10-CM | POA: Diagnosis not present

## 2016-04-25 DIAGNOSIS — I1310 Hypertensive heart and chronic kidney disease without heart failure, with stage 1 through stage 4 chronic kidney disease, or unspecified chronic kidney disease: Secondary | ICD-10-CM | POA: Diagnosis not present

## 2016-04-26 DIAGNOSIS — E785 Hyperlipidemia, unspecified: Secondary | ICD-10-CM | POA: Diagnosis not present

## 2016-04-26 DIAGNOSIS — N183 Chronic kidney disease, stage 3 (moderate): Secondary | ICD-10-CM | POA: Diagnosis not present

## 2016-04-26 DIAGNOSIS — G20 Parkinson's disease: Secondary | ICD-10-CM | POA: Diagnosis not present

## 2016-04-26 DIAGNOSIS — M6281 Muscle weakness (generalized): Secondary | ICD-10-CM | POA: Diagnosis not present

## 2016-04-26 DIAGNOSIS — R278 Other lack of coordination: Secondary | ICD-10-CM | POA: Diagnosis not present

## 2016-04-26 DIAGNOSIS — I1310 Hypertensive heart and chronic kidney disease without heart failure, with stage 1 through stage 4 chronic kidney disease, or unspecified chronic kidney disease: Secondary | ICD-10-CM | POA: Diagnosis not present

## 2016-04-27 DIAGNOSIS — N183 Chronic kidney disease, stage 3 (moderate): Secondary | ICD-10-CM | POA: Diagnosis not present

## 2016-04-27 DIAGNOSIS — R278 Other lack of coordination: Secondary | ICD-10-CM | POA: Diagnosis not present

## 2016-04-27 DIAGNOSIS — E785 Hyperlipidemia, unspecified: Secondary | ICD-10-CM | POA: Diagnosis not present

## 2016-04-27 DIAGNOSIS — I1310 Hypertensive heart and chronic kidney disease without heart failure, with stage 1 through stage 4 chronic kidney disease, or unspecified chronic kidney disease: Secondary | ICD-10-CM | POA: Diagnosis not present

## 2016-04-27 DIAGNOSIS — M6281 Muscle weakness (generalized): Secondary | ICD-10-CM | POA: Diagnosis not present

## 2016-04-27 DIAGNOSIS — G20 Parkinson's disease: Secondary | ICD-10-CM | POA: Diagnosis not present

## 2016-04-28 DIAGNOSIS — G20 Parkinson's disease: Secondary | ICD-10-CM | POA: Diagnosis not present

## 2016-04-28 DIAGNOSIS — E785 Hyperlipidemia, unspecified: Secondary | ICD-10-CM | POA: Diagnosis not present

## 2016-04-28 DIAGNOSIS — R278 Other lack of coordination: Secondary | ICD-10-CM | POA: Diagnosis not present

## 2016-04-28 DIAGNOSIS — M6281 Muscle weakness (generalized): Secondary | ICD-10-CM | POA: Diagnosis not present

## 2016-04-28 DIAGNOSIS — I1310 Hypertensive heart and chronic kidney disease without heart failure, with stage 1 through stage 4 chronic kidney disease, or unspecified chronic kidney disease: Secondary | ICD-10-CM | POA: Diagnosis not present

## 2016-04-28 DIAGNOSIS — N183 Chronic kidney disease, stage 3 (moderate): Secondary | ICD-10-CM | POA: Diagnosis not present

## 2016-04-29 DIAGNOSIS — N183 Chronic kidney disease, stage 3 (moderate): Secondary | ICD-10-CM | POA: Diagnosis not present

## 2016-04-29 DIAGNOSIS — R278 Other lack of coordination: Secondary | ICD-10-CM | POA: Diagnosis not present

## 2016-04-29 DIAGNOSIS — E785 Hyperlipidemia, unspecified: Secondary | ICD-10-CM | POA: Diagnosis not present

## 2016-04-29 DIAGNOSIS — M6281 Muscle weakness (generalized): Secondary | ICD-10-CM | POA: Diagnosis not present

## 2016-04-29 DIAGNOSIS — G20 Parkinson's disease: Secondary | ICD-10-CM | POA: Diagnosis not present

## 2016-04-29 DIAGNOSIS — I1310 Hypertensive heart and chronic kidney disease without heart failure, with stage 1 through stage 4 chronic kidney disease, or unspecified chronic kidney disease: Secondary | ICD-10-CM | POA: Diagnosis not present

## 2016-05-02 DIAGNOSIS — R278 Other lack of coordination: Secondary | ICD-10-CM | POA: Diagnosis not present

## 2016-05-02 DIAGNOSIS — I1310 Hypertensive heart and chronic kidney disease without heart failure, with stage 1 through stage 4 chronic kidney disease, or unspecified chronic kidney disease: Secondary | ICD-10-CM | POA: Diagnosis not present

## 2016-05-02 DIAGNOSIS — M6281 Muscle weakness (generalized): Secondary | ICD-10-CM | POA: Diagnosis not present

## 2016-05-02 DIAGNOSIS — E785 Hyperlipidemia, unspecified: Secondary | ICD-10-CM | POA: Diagnosis not present

## 2016-05-02 DIAGNOSIS — N183 Chronic kidney disease, stage 3 (moderate): Secondary | ICD-10-CM | POA: Diagnosis not present

## 2016-05-02 DIAGNOSIS — G20 Parkinson's disease: Secondary | ICD-10-CM | POA: Diagnosis not present

## 2016-05-03 DIAGNOSIS — E785 Hyperlipidemia, unspecified: Secondary | ICD-10-CM | POA: Diagnosis not present

## 2016-05-03 DIAGNOSIS — N183 Chronic kidney disease, stage 3 (moderate): Secondary | ICD-10-CM | POA: Diagnosis not present

## 2016-05-03 DIAGNOSIS — R278 Other lack of coordination: Secondary | ICD-10-CM | POA: Diagnosis not present

## 2016-05-03 DIAGNOSIS — I1310 Hypertensive heart and chronic kidney disease without heart failure, with stage 1 through stage 4 chronic kidney disease, or unspecified chronic kidney disease: Secondary | ICD-10-CM | POA: Diagnosis not present

## 2016-05-03 DIAGNOSIS — G20 Parkinson's disease: Secondary | ICD-10-CM | POA: Diagnosis not present

## 2016-05-03 DIAGNOSIS — M6281 Muscle weakness (generalized): Secondary | ICD-10-CM | POA: Diagnosis not present

## 2016-05-06 DIAGNOSIS — Z7982 Long term (current) use of aspirin: Secondary | ICD-10-CM | POA: Diagnosis not present

## 2016-06-01 DIAGNOSIS — F0280 Dementia in other diseases classified elsewhere without behavioral disturbance: Secondary | ICD-10-CM | POA: Diagnosis not present

## 2016-06-01 DIAGNOSIS — I2510 Atherosclerotic heart disease of native coronary artery without angina pectoris: Secondary | ICD-10-CM | POA: Diagnosis not present

## 2016-06-01 DIAGNOSIS — G20 Parkinson's disease: Secondary | ICD-10-CM | POA: Diagnosis not present

## 2016-06-01 DIAGNOSIS — I1310 Hypertensive heart and chronic kidney disease without heart failure, with stage 1 through stage 4 chronic kidney disease, or unspecified chronic kidney disease: Secondary | ICD-10-CM | POA: Diagnosis not present

## 2016-06-14 DIAGNOSIS — J22 Unspecified acute lower respiratory infection: Secondary | ICD-10-CM | POA: Diagnosis not present

## 2016-06-15 DIAGNOSIS — Z8673 Personal history of transient ischemic attack (TIA), and cerebral infarction without residual deficits: Secondary | ICD-10-CM | POA: Diagnosis not present

## 2016-06-15 DIAGNOSIS — K5790 Diverticulosis of intestine, part unspecified, without perforation or abscess without bleeding: Secondary | ICD-10-CM | POA: Diagnosis not present

## 2016-06-15 DIAGNOSIS — N183 Chronic kidney disease, stage 3 (moderate): Secondary | ICD-10-CM | POA: Diagnosis not present

## 2016-06-15 DIAGNOSIS — F329 Major depressive disorder, single episode, unspecified: Secondary | ICD-10-CM | POA: Diagnosis not present

## 2016-06-15 DIAGNOSIS — K219 Gastro-esophageal reflux disease without esophagitis: Secondary | ICD-10-CM | POA: Diagnosis not present

## 2016-06-15 DIAGNOSIS — I1310 Hypertensive heart and chronic kidney disease without heart failure, with stage 1 through stage 4 chronic kidney disease, or unspecified chronic kidney disease: Secondary | ICD-10-CM | POA: Diagnosis not present

## 2016-06-15 DIAGNOSIS — E785 Hyperlipidemia, unspecified: Secondary | ICD-10-CM | POA: Diagnosis not present

## 2016-06-15 DIAGNOSIS — F0280 Dementia in other diseases classified elsewhere without behavioral disturbance: Secondary | ICD-10-CM | POA: Diagnosis not present

## 2016-06-15 DIAGNOSIS — Z7982 Long term (current) use of aspirin: Secondary | ICD-10-CM | POA: Diagnosis not present

## 2016-06-15 DIAGNOSIS — R609 Edema, unspecified: Secondary | ICD-10-CM | POA: Diagnosis not present

## 2016-06-15 DIAGNOSIS — M810 Age-related osteoporosis without current pathological fracture: Secondary | ICD-10-CM | POA: Diagnosis not present

## 2016-06-15 DIAGNOSIS — E119 Type 2 diabetes mellitus without complications: Secondary | ICD-10-CM | POA: Diagnosis not present

## 2016-06-15 DIAGNOSIS — G309 Alzheimer's disease, unspecified: Secondary | ICD-10-CM | POA: Diagnosis not present

## 2016-06-15 DIAGNOSIS — K469 Unspecified abdominal hernia without obstruction or gangrene: Secondary | ICD-10-CM | POA: Diagnosis not present

## 2016-06-15 DIAGNOSIS — R0602 Shortness of breath: Secondary | ICD-10-CM | POA: Diagnosis not present

## 2016-06-15 DIAGNOSIS — S0990XD Unspecified injury of head, subsequent encounter: Secondary | ICD-10-CM | POA: Diagnosis not present

## 2016-06-15 DIAGNOSIS — R509 Fever, unspecified: Secondary | ICD-10-CM | POA: Diagnosis not present

## 2016-06-15 DIAGNOSIS — Z955 Presence of coronary angioplasty implant and graft: Secondary | ICD-10-CM | POA: Diagnosis not present

## 2016-06-15 DIAGNOSIS — D509 Iron deficiency anemia, unspecified: Secondary | ICD-10-CM | POA: Diagnosis not present

## 2016-06-15 DIAGNOSIS — I2510 Atherosclerotic heart disease of native coronary artery without angina pectoris: Secondary | ICD-10-CM | POA: Diagnosis not present

## 2016-06-15 DIAGNOSIS — R1312 Dysphagia, oropharyngeal phase: Secondary | ICD-10-CM | POA: Diagnosis not present

## 2016-06-15 DIAGNOSIS — G20 Parkinson's disease: Secondary | ICD-10-CM | POA: Diagnosis not present

## 2016-06-16 DIAGNOSIS — Z79899 Other long term (current) drug therapy: Secondary | ICD-10-CM | POA: Diagnosis not present

## 2016-06-16 DIAGNOSIS — D509 Iron deficiency anemia, unspecified: Secondary | ICD-10-CM | POA: Diagnosis not present

## 2016-06-20 DIAGNOSIS — N183 Chronic kidney disease, stage 3 (moderate): Secondary | ICD-10-CM | POA: Diagnosis not present

## 2016-06-20 DIAGNOSIS — R1312 Dysphagia, oropharyngeal phase: Secondary | ICD-10-CM | POA: Diagnosis not present

## 2016-06-20 DIAGNOSIS — G20 Parkinson's disease: Secondary | ICD-10-CM | POA: Diagnosis not present

## 2016-06-20 DIAGNOSIS — Z8673 Personal history of transient ischemic attack (TIA), and cerebral infarction without residual deficits: Secondary | ICD-10-CM | POA: Diagnosis not present

## 2016-06-20 DIAGNOSIS — I1310 Hypertensive heart and chronic kidney disease without heart failure, with stage 1 through stage 4 chronic kidney disease, or unspecified chronic kidney disease: Secondary | ICD-10-CM | POA: Diagnosis not present

## 2016-06-20 DIAGNOSIS — E785 Hyperlipidemia, unspecified: Secondary | ICD-10-CM | POA: Diagnosis not present

## 2016-06-21 DIAGNOSIS — Z8673 Personal history of transient ischemic attack (TIA), and cerebral infarction without residual deficits: Secondary | ICD-10-CM | POA: Diagnosis not present

## 2016-06-21 DIAGNOSIS — R609 Edema, unspecified: Secondary | ICD-10-CM | POA: Diagnosis not present

## 2016-06-21 DIAGNOSIS — R1312 Dysphagia, oropharyngeal phase: Secondary | ICD-10-CM | POA: Diagnosis not present

## 2016-06-21 DIAGNOSIS — F329 Major depressive disorder, single episode, unspecified: Secondary | ICD-10-CM | POA: Diagnosis not present

## 2016-06-21 DIAGNOSIS — M810 Age-related osteoporosis without current pathological fracture: Secondary | ICD-10-CM | POA: Diagnosis not present

## 2016-06-21 DIAGNOSIS — Z955 Presence of coronary angioplasty implant and graft: Secondary | ICD-10-CM | POA: Diagnosis not present

## 2016-06-21 DIAGNOSIS — I2510 Atherosclerotic heart disease of native coronary artery without angina pectoris: Secondary | ICD-10-CM | POA: Diagnosis not present

## 2016-06-21 DIAGNOSIS — K469 Unspecified abdominal hernia without obstruction or gangrene: Secondary | ICD-10-CM | POA: Diagnosis not present

## 2016-06-21 DIAGNOSIS — Z7982 Long term (current) use of aspirin: Secondary | ICD-10-CM | POA: Diagnosis not present

## 2016-06-21 DIAGNOSIS — K5790 Diverticulosis of intestine, part unspecified, without perforation or abscess without bleeding: Secondary | ICD-10-CM | POA: Diagnosis not present

## 2016-06-21 DIAGNOSIS — K219 Gastro-esophageal reflux disease without esophagitis: Secondary | ICD-10-CM | POA: Diagnosis not present

## 2016-06-21 DIAGNOSIS — E785 Hyperlipidemia, unspecified: Secondary | ICD-10-CM | POA: Diagnosis not present

## 2016-06-21 DIAGNOSIS — E119 Type 2 diabetes mellitus without complications: Secondary | ICD-10-CM | POA: Diagnosis not present

## 2016-06-21 DIAGNOSIS — G20 Parkinson's disease: Secondary | ICD-10-CM | POA: Diagnosis not present

## 2016-06-21 DIAGNOSIS — S0990XD Unspecified injury of head, subsequent encounter: Secondary | ICD-10-CM | POA: Diagnosis not present

## 2016-06-21 DIAGNOSIS — I1310 Hypertensive heart and chronic kidney disease without heart failure, with stage 1 through stage 4 chronic kidney disease, or unspecified chronic kidney disease: Secondary | ICD-10-CM | POA: Diagnosis not present

## 2016-06-21 DIAGNOSIS — N183 Chronic kidney disease, stage 3 (moderate): Secondary | ICD-10-CM | POA: Diagnosis not present

## 2016-06-21 DIAGNOSIS — Z79899 Other long term (current) drug therapy: Secondary | ICD-10-CM | POA: Diagnosis not present

## 2016-06-21 DIAGNOSIS — G309 Alzheimer's disease, unspecified: Secondary | ICD-10-CM | POA: Diagnosis not present

## 2016-06-21 DIAGNOSIS — F0280 Dementia in other diseases classified elsewhere without behavioral disturbance: Secondary | ICD-10-CM | POA: Diagnosis not present

## 2016-06-21 DIAGNOSIS — D509 Iron deficiency anemia, unspecified: Secondary | ICD-10-CM | POA: Diagnosis not present

## 2016-06-23 DIAGNOSIS — N183 Chronic kidney disease, stage 3 (moderate): Secondary | ICD-10-CM | POA: Diagnosis not present

## 2016-06-23 DIAGNOSIS — E785 Hyperlipidemia, unspecified: Secondary | ICD-10-CM | POA: Diagnosis not present

## 2016-06-23 DIAGNOSIS — Z8673 Personal history of transient ischemic attack (TIA), and cerebral infarction without residual deficits: Secondary | ICD-10-CM | POA: Diagnosis not present

## 2016-06-23 DIAGNOSIS — I1310 Hypertensive heart and chronic kidney disease without heart failure, with stage 1 through stage 4 chronic kidney disease, or unspecified chronic kidney disease: Secondary | ICD-10-CM | POA: Diagnosis not present

## 2016-06-23 DIAGNOSIS — G20 Parkinson's disease: Secondary | ICD-10-CM | POA: Diagnosis not present

## 2016-06-23 DIAGNOSIS — R1312 Dysphagia, oropharyngeal phase: Secondary | ICD-10-CM | POA: Diagnosis not present

## 2016-06-24 DIAGNOSIS — Z8673 Personal history of transient ischemic attack (TIA), and cerebral infarction without residual deficits: Secondary | ICD-10-CM | POA: Diagnosis not present

## 2016-06-24 DIAGNOSIS — R1312 Dysphagia, oropharyngeal phase: Secondary | ICD-10-CM | POA: Diagnosis not present

## 2016-06-24 DIAGNOSIS — I1310 Hypertensive heart and chronic kidney disease without heart failure, with stage 1 through stage 4 chronic kidney disease, or unspecified chronic kidney disease: Secondary | ICD-10-CM | POA: Diagnosis not present

## 2016-06-24 DIAGNOSIS — E785 Hyperlipidemia, unspecified: Secondary | ICD-10-CM | POA: Diagnosis not present

## 2016-06-24 DIAGNOSIS — N183 Chronic kidney disease, stage 3 (moderate): Secondary | ICD-10-CM | POA: Diagnosis not present

## 2016-06-24 DIAGNOSIS — G20 Parkinson's disease: Secondary | ICD-10-CM | POA: Diagnosis not present

## 2016-06-27 DIAGNOSIS — E785 Hyperlipidemia, unspecified: Secondary | ICD-10-CM | POA: Diagnosis not present

## 2016-06-27 DIAGNOSIS — Z8673 Personal history of transient ischemic attack (TIA), and cerebral infarction without residual deficits: Secondary | ICD-10-CM | POA: Diagnosis not present

## 2016-06-27 DIAGNOSIS — G20 Parkinson's disease: Secondary | ICD-10-CM | POA: Diagnosis not present

## 2016-06-27 DIAGNOSIS — N183 Chronic kidney disease, stage 3 (moderate): Secondary | ICD-10-CM | POA: Diagnosis not present

## 2016-06-27 DIAGNOSIS — R1312 Dysphagia, oropharyngeal phase: Secondary | ICD-10-CM | POA: Diagnosis not present

## 2016-06-27 DIAGNOSIS — I1310 Hypertensive heart and chronic kidney disease without heart failure, with stage 1 through stage 4 chronic kidney disease, or unspecified chronic kidney disease: Secondary | ICD-10-CM | POA: Diagnosis not present

## 2016-06-28 DIAGNOSIS — N183 Chronic kidney disease, stage 3 (moderate): Secondary | ICD-10-CM | POA: Diagnosis not present

## 2016-06-28 DIAGNOSIS — Z8673 Personal history of transient ischemic attack (TIA), and cerebral infarction without residual deficits: Secondary | ICD-10-CM | POA: Diagnosis not present

## 2016-06-28 DIAGNOSIS — E785 Hyperlipidemia, unspecified: Secondary | ICD-10-CM | POA: Diagnosis not present

## 2016-06-28 DIAGNOSIS — G20 Parkinson's disease: Secondary | ICD-10-CM | POA: Diagnosis not present

## 2016-06-28 DIAGNOSIS — I1310 Hypertensive heart and chronic kidney disease without heart failure, with stage 1 through stage 4 chronic kidney disease, or unspecified chronic kidney disease: Secondary | ICD-10-CM | POA: Diagnosis not present

## 2016-06-28 DIAGNOSIS — R1312 Dysphagia, oropharyngeal phase: Secondary | ICD-10-CM | POA: Diagnosis not present

## 2016-07-04 DIAGNOSIS — R062 Wheezing: Secondary | ICD-10-CM | POA: Diagnosis not present

## 2016-07-04 DIAGNOSIS — R0602 Shortness of breath: Secondary | ICD-10-CM | POA: Diagnosis not present

## 2016-07-05 DIAGNOSIS — J449 Chronic obstructive pulmonary disease, unspecified: Secondary | ICD-10-CM | POA: Diagnosis not present

## 2016-07-05 DIAGNOSIS — G309 Alzheimer's disease, unspecified: Secondary | ICD-10-CM | POA: Diagnosis not present

## 2016-07-05 DIAGNOSIS — F0280 Dementia in other diseases classified elsewhere without behavioral disturbance: Secondary | ICD-10-CM | POA: Diagnosis not present

## 2016-08-12 DIAGNOSIS — E119 Type 2 diabetes mellitus without complications: Secondary | ICD-10-CM | POA: Diagnosis not present

## 2016-08-12 DIAGNOSIS — F0280 Dementia in other diseases classified elsewhere without behavioral disturbance: Secondary | ICD-10-CM | POA: Diagnosis not present

## 2016-08-12 DIAGNOSIS — G20 Parkinson's disease: Secondary | ICD-10-CM | POA: Diagnosis not present

## 2016-08-12 DIAGNOSIS — I1310 Hypertensive heart and chronic kidney disease without heart failure, with stage 1 through stage 4 chronic kidney disease, or unspecified chronic kidney disease: Secondary | ICD-10-CM | POA: Diagnosis not present

## 2016-08-13 DIAGNOSIS — D649 Anemia, unspecified: Secondary | ICD-10-CM | POA: Diagnosis not present

## 2016-09-12 DIAGNOSIS — I1310 Hypertensive heart and chronic kidney disease without heart failure, with stage 1 through stage 4 chronic kidney disease, or unspecified chronic kidney disease: Secondary | ICD-10-CM | POA: Diagnosis not present

## 2016-09-12 DIAGNOSIS — M6281 Muscle weakness (generalized): Secondary | ICD-10-CM | POA: Diagnosis not present

## 2016-09-14 DIAGNOSIS — S0990XD Unspecified injury of head, subsequent encounter: Secondary | ICD-10-CM | POA: Diagnosis not present

## 2016-09-14 DIAGNOSIS — M810 Age-related osteoporosis without current pathological fracture: Secondary | ICD-10-CM | POA: Diagnosis not present

## 2016-09-14 DIAGNOSIS — G309 Alzheimer's disease, unspecified: Secondary | ICD-10-CM | POA: Diagnosis not present

## 2016-09-14 DIAGNOSIS — Z7982 Long term (current) use of aspirin: Secondary | ICD-10-CM | POA: Diagnosis not present

## 2016-09-14 DIAGNOSIS — E119 Type 2 diabetes mellitus without complications: Secondary | ICD-10-CM | POA: Diagnosis not present

## 2016-09-14 DIAGNOSIS — Z955 Presence of coronary angioplasty implant and graft: Secondary | ICD-10-CM | POA: Diagnosis not present

## 2016-09-14 DIAGNOSIS — F329 Major depressive disorder, single episode, unspecified: Secondary | ICD-10-CM | POA: Diagnosis not present

## 2016-09-14 DIAGNOSIS — K469 Unspecified abdominal hernia without obstruction or gangrene: Secondary | ICD-10-CM | POA: Diagnosis not present

## 2016-09-14 DIAGNOSIS — N183 Chronic kidney disease, stage 3 (moderate): Secondary | ICD-10-CM | POA: Diagnosis not present

## 2016-09-14 DIAGNOSIS — Z9181 History of falling: Secondary | ICD-10-CM | POA: Diagnosis not present

## 2016-09-14 DIAGNOSIS — Z8673 Personal history of transient ischemic attack (TIA), and cerebral infarction without residual deficits: Secondary | ICD-10-CM | POA: Diagnosis not present

## 2016-09-14 DIAGNOSIS — R293 Abnormal posture: Secondary | ICD-10-CM | POA: Diagnosis not present

## 2016-09-14 DIAGNOSIS — I1310 Hypertensive heart and chronic kidney disease without heart failure, with stage 1 through stage 4 chronic kidney disease, or unspecified chronic kidney disease: Secondary | ICD-10-CM | POA: Diagnosis not present

## 2016-09-14 DIAGNOSIS — I2510 Atherosclerotic heart disease of native coronary artery without angina pectoris: Secondary | ICD-10-CM | POA: Diagnosis not present

## 2016-09-14 DIAGNOSIS — E785 Hyperlipidemia, unspecified: Secondary | ICD-10-CM | POA: Diagnosis not present

## 2016-09-14 DIAGNOSIS — K5790 Diverticulosis of intestine, part unspecified, without perforation or abscess without bleeding: Secondary | ICD-10-CM | POA: Diagnosis not present

## 2016-09-14 DIAGNOSIS — F0280 Dementia in other diseases classified elsewhere without behavioral disturbance: Secondary | ICD-10-CM | POA: Diagnosis not present

## 2016-09-14 DIAGNOSIS — R609 Edema, unspecified: Secondary | ICD-10-CM | POA: Diagnosis not present

## 2016-09-14 DIAGNOSIS — D509 Iron deficiency anemia, unspecified: Secondary | ICD-10-CM | POA: Diagnosis not present

## 2016-09-14 DIAGNOSIS — K219 Gastro-esophageal reflux disease without esophagitis: Secondary | ICD-10-CM | POA: Diagnosis not present

## 2016-09-15 DIAGNOSIS — R293 Abnormal posture: Secondary | ICD-10-CM | POA: Diagnosis not present

## 2016-09-15 DIAGNOSIS — Z8673 Personal history of transient ischemic attack (TIA), and cerebral infarction without residual deficits: Secondary | ICD-10-CM | POA: Diagnosis not present

## 2016-09-15 DIAGNOSIS — E785 Hyperlipidemia, unspecified: Secondary | ICD-10-CM | POA: Diagnosis not present

## 2016-09-15 DIAGNOSIS — Z9181 History of falling: Secondary | ICD-10-CM | POA: Diagnosis not present

## 2016-09-15 DIAGNOSIS — I1310 Hypertensive heart and chronic kidney disease without heart failure, with stage 1 through stage 4 chronic kidney disease, or unspecified chronic kidney disease: Secondary | ICD-10-CM | POA: Diagnosis not present

## 2016-09-15 DIAGNOSIS — N183 Chronic kidney disease, stage 3 (moderate): Secondary | ICD-10-CM | POA: Diagnosis not present

## 2016-09-19 DIAGNOSIS — I1310 Hypertensive heart and chronic kidney disease without heart failure, with stage 1 through stage 4 chronic kidney disease, or unspecified chronic kidney disease: Secondary | ICD-10-CM | POA: Diagnosis not present

## 2016-09-19 DIAGNOSIS — E785 Hyperlipidemia, unspecified: Secondary | ICD-10-CM | POA: Diagnosis not present

## 2016-09-19 DIAGNOSIS — Z9181 History of falling: Secondary | ICD-10-CM | POA: Diagnosis not present

## 2016-09-19 DIAGNOSIS — R293 Abnormal posture: Secondary | ICD-10-CM | POA: Diagnosis not present

## 2016-09-19 DIAGNOSIS — Z8673 Personal history of transient ischemic attack (TIA), and cerebral infarction without residual deficits: Secondary | ICD-10-CM | POA: Diagnosis not present

## 2016-09-19 DIAGNOSIS — N183 Chronic kidney disease, stage 3 (moderate): Secondary | ICD-10-CM | POA: Diagnosis not present

## 2016-09-20 DIAGNOSIS — D509 Iron deficiency anemia, unspecified: Secondary | ICD-10-CM | POA: Diagnosis not present

## 2016-09-21 DIAGNOSIS — F0280 Dementia in other diseases classified elsewhere without behavioral disturbance: Secondary | ICD-10-CM | POA: Diagnosis not present

## 2016-09-21 DIAGNOSIS — F329 Major depressive disorder, single episode, unspecified: Secondary | ICD-10-CM | POA: Diagnosis not present

## 2016-09-21 DIAGNOSIS — G309 Alzheimer's disease, unspecified: Secondary | ICD-10-CM | POA: Diagnosis not present

## 2016-09-21 DIAGNOSIS — S0990XD Unspecified injury of head, subsequent encounter: Secondary | ICD-10-CM | POA: Diagnosis not present

## 2016-09-21 DIAGNOSIS — K219 Gastro-esophageal reflux disease without esophagitis: Secondary | ICD-10-CM | POA: Diagnosis not present

## 2016-09-21 DIAGNOSIS — R293 Abnormal posture: Secondary | ICD-10-CM | POA: Diagnosis not present

## 2016-09-21 DIAGNOSIS — I2510 Atherosclerotic heart disease of native coronary artery without angina pectoris: Secondary | ICD-10-CM | POA: Diagnosis not present

## 2016-09-21 DIAGNOSIS — D509 Iron deficiency anemia, unspecified: Secondary | ICD-10-CM | POA: Diagnosis not present

## 2016-09-21 DIAGNOSIS — M810 Age-related osteoporosis without current pathological fracture: Secondary | ICD-10-CM | POA: Diagnosis not present

## 2016-09-21 DIAGNOSIS — I1310 Hypertensive heart and chronic kidney disease without heart failure, with stage 1 through stage 4 chronic kidney disease, or unspecified chronic kidney disease: Secondary | ICD-10-CM | POA: Diagnosis not present

## 2016-09-21 DIAGNOSIS — N183 Chronic kidney disease, stage 3 (moderate): Secondary | ICD-10-CM | POA: Diagnosis not present

## 2016-09-21 DIAGNOSIS — R609 Edema, unspecified: Secondary | ICD-10-CM | POA: Diagnosis not present

## 2016-09-21 DIAGNOSIS — K469 Unspecified abdominal hernia without obstruction or gangrene: Secondary | ICD-10-CM | POA: Diagnosis not present

## 2016-09-21 DIAGNOSIS — E119 Type 2 diabetes mellitus without complications: Secondary | ICD-10-CM | POA: Diagnosis not present

## 2016-09-21 DIAGNOSIS — E785 Hyperlipidemia, unspecified: Secondary | ICD-10-CM | POA: Diagnosis not present

## 2016-09-21 DIAGNOSIS — Z8673 Personal history of transient ischemic attack (TIA), and cerebral infarction without residual deficits: Secondary | ICD-10-CM | POA: Diagnosis not present

## 2016-09-21 DIAGNOSIS — Z955 Presence of coronary angioplasty implant and graft: Secondary | ICD-10-CM | POA: Diagnosis not present

## 2016-09-21 DIAGNOSIS — K5790 Diverticulosis of intestine, part unspecified, without perforation or abscess without bleeding: Secondary | ICD-10-CM | POA: Diagnosis not present

## 2016-09-21 DIAGNOSIS — Z7982 Long term (current) use of aspirin: Secondary | ICD-10-CM | POA: Diagnosis not present

## 2016-09-21 DIAGNOSIS — G20 Parkinson's disease: Secondary | ICD-10-CM | POA: Diagnosis not present

## 2016-09-22 DIAGNOSIS — I1310 Hypertensive heart and chronic kidney disease without heart failure, with stage 1 through stage 4 chronic kidney disease, or unspecified chronic kidney disease: Secondary | ICD-10-CM | POA: Diagnosis not present

## 2016-09-22 DIAGNOSIS — Z8673 Personal history of transient ischemic attack (TIA), and cerebral infarction without residual deficits: Secondary | ICD-10-CM | POA: Diagnosis not present

## 2016-09-22 DIAGNOSIS — E785 Hyperlipidemia, unspecified: Secondary | ICD-10-CM | POA: Diagnosis not present

## 2016-09-22 DIAGNOSIS — N183 Chronic kidney disease, stage 3 (moderate): Secondary | ICD-10-CM | POA: Diagnosis not present

## 2016-09-22 DIAGNOSIS — R293 Abnormal posture: Secondary | ICD-10-CM | POA: Diagnosis not present

## 2016-09-22 DIAGNOSIS — G20 Parkinson's disease: Secondary | ICD-10-CM | POA: Diagnosis not present

## 2016-09-23 DIAGNOSIS — D509 Iron deficiency anemia, unspecified: Secondary | ICD-10-CM | POA: Diagnosis not present

## 2016-09-26 DIAGNOSIS — Z8673 Personal history of transient ischemic attack (TIA), and cerebral infarction without residual deficits: Secondary | ICD-10-CM | POA: Diagnosis not present

## 2016-09-26 DIAGNOSIS — I1310 Hypertensive heart and chronic kidney disease without heart failure, with stage 1 through stage 4 chronic kidney disease, or unspecified chronic kidney disease: Secondary | ICD-10-CM | POA: Diagnosis not present

## 2016-09-26 DIAGNOSIS — N183 Chronic kidney disease, stage 3 (moderate): Secondary | ICD-10-CM | POA: Diagnosis not present

## 2016-09-26 DIAGNOSIS — E785 Hyperlipidemia, unspecified: Secondary | ICD-10-CM | POA: Diagnosis not present

## 2016-09-26 DIAGNOSIS — R293 Abnormal posture: Secondary | ICD-10-CM | POA: Diagnosis not present

## 2016-09-26 DIAGNOSIS — G20 Parkinson's disease: Secondary | ICD-10-CM | POA: Diagnosis not present

## 2016-09-28 DIAGNOSIS — I1310 Hypertensive heart and chronic kidney disease without heart failure, with stage 1 through stage 4 chronic kidney disease, or unspecified chronic kidney disease: Secondary | ICD-10-CM | POA: Diagnosis not present

## 2016-09-28 DIAGNOSIS — R293 Abnormal posture: Secondary | ICD-10-CM | POA: Diagnosis not present

## 2016-09-28 DIAGNOSIS — Z8673 Personal history of transient ischemic attack (TIA), and cerebral infarction without residual deficits: Secondary | ICD-10-CM | POA: Diagnosis not present

## 2016-09-28 DIAGNOSIS — N183 Chronic kidney disease, stage 3 (moderate): Secondary | ICD-10-CM | POA: Diagnosis not present

## 2016-09-28 DIAGNOSIS — E785 Hyperlipidemia, unspecified: Secondary | ICD-10-CM | POA: Diagnosis not present

## 2016-09-28 DIAGNOSIS — G20 Parkinson's disease: Secondary | ICD-10-CM | POA: Diagnosis not present

## 2016-11-01 DIAGNOSIS — K219 Gastro-esophageal reflux disease without esophagitis: Secondary | ICD-10-CM | POA: Diagnosis not present

## 2016-11-01 DIAGNOSIS — H109 Unspecified conjunctivitis: Secondary | ICD-10-CM | POA: Diagnosis not present

## 2016-11-01 DIAGNOSIS — G309 Alzheimer's disease, unspecified: Secondary | ICD-10-CM | POA: Diagnosis not present

## 2016-11-01 DIAGNOSIS — I70209 Unspecified atherosclerosis of native arteries of extremities, unspecified extremity: Secondary | ICD-10-CM | POA: Diagnosis not present

## 2016-11-01 DIAGNOSIS — F0280 Dementia in other diseases classified elsewhere without behavioral disturbance: Secondary | ICD-10-CM | POA: Diagnosis not present

## 2016-11-01 DIAGNOSIS — I672 Cerebral atherosclerosis: Secondary | ICD-10-CM | POA: Diagnosis not present

## 2016-11-01 DIAGNOSIS — I2510 Atherosclerotic heart disease of native coronary artery without angina pectoris: Secondary | ICD-10-CM | POA: Diagnosis not present

## 2016-11-01 DIAGNOSIS — I1310 Hypertensive heart and chronic kidney disease without heart failure, with stage 1 through stage 4 chronic kidney disease, or unspecified chronic kidney disease: Secondary | ICD-10-CM | POA: Diagnosis not present

## 2016-11-23 DIAGNOSIS — I1310 Hypertensive heart and chronic kidney disease without heart failure, with stage 1 through stage 4 chronic kidney disease, or unspecified chronic kidney disease: Secondary | ICD-10-CM | POA: Diagnosis not present

## 2016-12-06 DIAGNOSIS — F0390 Unspecified dementia without behavioral disturbance: Secondary | ICD-10-CM | POA: Diagnosis not present

## 2016-12-06 DIAGNOSIS — K219 Gastro-esophageal reflux disease without esophagitis: Secondary | ICD-10-CM | POA: Diagnosis not present

## 2016-12-06 DIAGNOSIS — K5790 Diverticulosis of intestine, part unspecified, without perforation or abscess without bleeding: Secondary | ICD-10-CM | POA: Diagnosis not present

## 2016-12-06 DIAGNOSIS — R1312 Dysphagia, oropharyngeal phase: Secondary | ICD-10-CM | POA: Diagnosis not present

## 2016-12-06 DIAGNOSIS — R278 Other lack of coordination: Secondary | ICD-10-CM | POA: Diagnosis not present

## 2016-12-06 DIAGNOSIS — M6281 Muscle weakness (generalized): Secondary | ICD-10-CM | POA: Diagnosis not present

## 2016-12-06 DIAGNOSIS — G20 Parkinson's disease: Secondary | ICD-10-CM | POA: Diagnosis not present

## 2016-12-08 DIAGNOSIS — R278 Other lack of coordination: Secondary | ICD-10-CM | POA: Diagnosis not present

## 2016-12-08 DIAGNOSIS — M6281 Muscle weakness (generalized): Secondary | ICD-10-CM | POA: Diagnosis not present

## 2016-12-08 DIAGNOSIS — K219 Gastro-esophageal reflux disease without esophagitis: Secondary | ICD-10-CM | POA: Diagnosis not present

## 2016-12-08 DIAGNOSIS — F0390 Unspecified dementia without behavioral disturbance: Secondary | ICD-10-CM | POA: Diagnosis not present

## 2016-12-08 DIAGNOSIS — G20 Parkinson's disease: Secondary | ICD-10-CM | POA: Diagnosis not present

## 2016-12-08 DIAGNOSIS — R1312 Dysphagia, oropharyngeal phase: Secondary | ICD-10-CM | POA: Diagnosis not present

## 2016-12-10 DIAGNOSIS — G20 Parkinson's disease: Secondary | ICD-10-CM | POA: Diagnosis not present

## 2016-12-10 DIAGNOSIS — M6281 Muscle weakness (generalized): Secondary | ICD-10-CM | POA: Diagnosis not present

## 2016-12-10 DIAGNOSIS — F0390 Unspecified dementia without behavioral disturbance: Secondary | ICD-10-CM | POA: Diagnosis not present

## 2016-12-10 DIAGNOSIS — K219 Gastro-esophageal reflux disease without esophagitis: Secondary | ICD-10-CM | POA: Diagnosis not present

## 2016-12-10 DIAGNOSIS — R278 Other lack of coordination: Secondary | ICD-10-CM | POA: Diagnosis not present

## 2016-12-10 DIAGNOSIS — R1312 Dysphagia, oropharyngeal phase: Secondary | ICD-10-CM | POA: Diagnosis not present

## 2016-12-12 DIAGNOSIS — R1312 Dysphagia, oropharyngeal phase: Secondary | ICD-10-CM | POA: Diagnosis not present

## 2016-12-12 DIAGNOSIS — G20 Parkinson's disease: Secondary | ICD-10-CM | POA: Diagnosis not present

## 2016-12-12 DIAGNOSIS — R278 Other lack of coordination: Secondary | ICD-10-CM | POA: Diagnosis not present

## 2016-12-12 DIAGNOSIS — K219 Gastro-esophageal reflux disease without esophagitis: Secondary | ICD-10-CM | POA: Diagnosis not present

## 2016-12-12 DIAGNOSIS — F0390 Unspecified dementia without behavioral disturbance: Secondary | ICD-10-CM | POA: Diagnosis not present

## 2016-12-12 DIAGNOSIS — M6281 Muscle weakness (generalized): Secondary | ICD-10-CM | POA: Diagnosis not present

## 2016-12-13 DIAGNOSIS — K219 Gastro-esophageal reflux disease without esophagitis: Secondary | ICD-10-CM | POA: Diagnosis not present

## 2016-12-13 DIAGNOSIS — R1312 Dysphagia, oropharyngeal phase: Secondary | ICD-10-CM | POA: Diagnosis not present

## 2016-12-13 DIAGNOSIS — M6281 Muscle weakness (generalized): Secondary | ICD-10-CM | POA: Diagnosis not present

## 2016-12-13 DIAGNOSIS — G20 Parkinson's disease: Secondary | ICD-10-CM | POA: Diagnosis not present

## 2016-12-13 DIAGNOSIS — R278 Other lack of coordination: Secondary | ICD-10-CM | POA: Diagnosis not present

## 2016-12-13 DIAGNOSIS — F0390 Unspecified dementia without behavioral disturbance: Secondary | ICD-10-CM | POA: Diagnosis not present

## 2016-12-14 DIAGNOSIS — G20 Parkinson's disease: Secondary | ICD-10-CM | POA: Diagnosis not present

## 2016-12-14 DIAGNOSIS — M6281 Muscle weakness (generalized): Secondary | ICD-10-CM | POA: Diagnosis not present

## 2016-12-14 DIAGNOSIS — K219 Gastro-esophageal reflux disease without esophagitis: Secondary | ICD-10-CM | POA: Diagnosis not present

## 2016-12-14 DIAGNOSIS — R1312 Dysphagia, oropharyngeal phase: Secondary | ICD-10-CM | POA: Diagnosis not present

## 2016-12-14 DIAGNOSIS — R278 Other lack of coordination: Secondary | ICD-10-CM | POA: Diagnosis not present

## 2016-12-14 DIAGNOSIS — F0390 Unspecified dementia without behavioral disturbance: Secondary | ICD-10-CM | POA: Diagnosis not present

## 2016-12-15 DIAGNOSIS — F0390 Unspecified dementia without behavioral disturbance: Secondary | ICD-10-CM | POA: Diagnosis not present

## 2016-12-15 DIAGNOSIS — G20 Parkinson's disease: Secondary | ICD-10-CM | POA: Diagnosis not present

## 2016-12-15 DIAGNOSIS — K219 Gastro-esophageal reflux disease without esophagitis: Secondary | ICD-10-CM | POA: Diagnosis not present

## 2016-12-15 DIAGNOSIS — R278 Other lack of coordination: Secondary | ICD-10-CM | POA: Diagnosis not present

## 2016-12-15 DIAGNOSIS — R1312 Dysphagia, oropharyngeal phase: Secondary | ICD-10-CM | POA: Diagnosis not present

## 2016-12-15 DIAGNOSIS — M6281 Muscle weakness (generalized): Secondary | ICD-10-CM | POA: Diagnosis not present

## 2016-12-16 DIAGNOSIS — K219 Gastro-esophageal reflux disease without esophagitis: Secondary | ICD-10-CM | POA: Diagnosis not present

## 2016-12-16 DIAGNOSIS — R278 Other lack of coordination: Secondary | ICD-10-CM | POA: Diagnosis not present

## 2016-12-16 DIAGNOSIS — R1312 Dysphagia, oropharyngeal phase: Secondary | ICD-10-CM | POA: Diagnosis not present

## 2016-12-16 DIAGNOSIS — F0390 Unspecified dementia without behavioral disturbance: Secondary | ICD-10-CM | POA: Diagnosis not present

## 2016-12-16 DIAGNOSIS — G20 Parkinson's disease: Secondary | ICD-10-CM | POA: Diagnosis not present

## 2016-12-16 DIAGNOSIS — M6281 Muscle weakness (generalized): Secondary | ICD-10-CM | POA: Diagnosis not present

## 2016-12-19 DIAGNOSIS — R278 Other lack of coordination: Secondary | ICD-10-CM | POA: Diagnosis not present

## 2016-12-19 DIAGNOSIS — F0390 Unspecified dementia without behavioral disturbance: Secondary | ICD-10-CM | POA: Diagnosis not present

## 2016-12-19 DIAGNOSIS — M6281 Muscle weakness (generalized): Secondary | ICD-10-CM | POA: Diagnosis not present

## 2016-12-19 DIAGNOSIS — R1312 Dysphagia, oropharyngeal phase: Secondary | ICD-10-CM | POA: Diagnosis not present

## 2016-12-19 DIAGNOSIS — G20 Parkinson's disease: Secondary | ICD-10-CM | POA: Diagnosis not present

## 2016-12-19 DIAGNOSIS — K219 Gastro-esophageal reflux disease without esophagitis: Secondary | ICD-10-CM | POA: Diagnosis not present

## 2016-12-21 DIAGNOSIS — G20 Parkinson's disease: Secondary | ICD-10-CM | POA: Diagnosis not present

## 2016-12-21 DIAGNOSIS — F0390 Unspecified dementia without behavioral disturbance: Secondary | ICD-10-CM | POA: Diagnosis not present

## 2016-12-21 DIAGNOSIS — R278 Other lack of coordination: Secondary | ICD-10-CM | POA: Diagnosis not present

## 2016-12-21 DIAGNOSIS — K219 Gastro-esophageal reflux disease without esophagitis: Secondary | ICD-10-CM | POA: Diagnosis not present

## 2016-12-21 DIAGNOSIS — M6281 Muscle weakness (generalized): Secondary | ICD-10-CM | POA: Diagnosis not present

## 2016-12-21 DIAGNOSIS — R1312 Dysphagia, oropharyngeal phase: Secondary | ICD-10-CM | POA: Diagnosis not present

## 2016-12-23 DIAGNOSIS — Z7982 Long term (current) use of aspirin: Secondary | ICD-10-CM | POA: Diagnosis not present

## 2016-12-23 DIAGNOSIS — G309 Alzheimer's disease, unspecified: Secondary | ICD-10-CM | POA: Diagnosis not present

## 2016-12-23 DIAGNOSIS — N183 Chronic kidney disease, stage 3 (moderate): Secondary | ICD-10-CM | POA: Diagnosis not present

## 2016-12-23 DIAGNOSIS — F0280 Dementia in other diseases classified elsewhere without behavioral disturbance: Secondary | ICD-10-CM | POA: Diagnosis not present

## 2016-12-23 DIAGNOSIS — M810 Age-related osteoporosis without current pathological fracture: Secondary | ICD-10-CM | POA: Diagnosis not present

## 2016-12-23 DIAGNOSIS — R278 Other lack of coordination: Secondary | ICD-10-CM | POA: Diagnosis not present

## 2016-12-23 DIAGNOSIS — G20 Parkinson's disease: Secondary | ICD-10-CM | POA: Diagnosis not present

## 2016-12-23 DIAGNOSIS — E119 Type 2 diabetes mellitus without complications: Secondary | ICD-10-CM | POA: Diagnosis not present

## 2016-12-23 DIAGNOSIS — R1312 Dysphagia, oropharyngeal phase: Secondary | ICD-10-CM | POA: Diagnosis not present

## 2016-12-23 DIAGNOSIS — F329 Major depressive disorder, single episode, unspecified: Secondary | ICD-10-CM | POA: Diagnosis not present

## 2016-12-23 DIAGNOSIS — M6281 Muscle weakness (generalized): Secondary | ICD-10-CM | POA: Diagnosis not present

## 2016-12-23 DIAGNOSIS — K5790 Diverticulosis of intestine, part unspecified, without perforation or abscess without bleeding: Secondary | ICD-10-CM | POA: Diagnosis not present

## 2016-12-23 DIAGNOSIS — D509 Iron deficiency anemia, unspecified: Secondary | ICD-10-CM | POA: Diagnosis not present

## 2016-12-23 DIAGNOSIS — F0390 Unspecified dementia without behavioral disturbance: Secondary | ICD-10-CM | POA: Diagnosis not present

## 2016-12-23 DIAGNOSIS — S0990XD Unspecified injury of head, subsequent encounter: Secondary | ICD-10-CM | POA: Diagnosis not present

## 2016-12-23 DIAGNOSIS — R609 Edema, unspecified: Secondary | ICD-10-CM | POA: Diagnosis not present

## 2016-12-23 DIAGNOSIS — I2510 Atherosclerotic heart disease of native coronary artery without angina pectoris: Secondary | ICD-10-CM | POA: Diagnosis not present

## 2016-12-23 DIAGNOSIS — K469 Unspecified abdominal hernia without obstruction or gangrene: Secondary | ICD-10-CM | POA: Diagnosis not present

## 2016-12-23 DIAGNOSIS — K219 Gastro-esophageal reflux disease without esophagitis: Secondary | ICD-10-CM | POA: Diagnosis not present

## 2016-12-23 DIAGNOSIS — Z955 Presence of coronary angioplasty implant and graft: Secondary | ICD-10-CM | POA: Diagnosis not present

## 2016-12-27 DIAGNOSIS — R278 Other lack of coordination: Secondary | ICD-10-CM | POA: Diagnosis not present

## 2016-12-27 DIAGNOSIS — G20 Parkinson's disease: Secondary | ICD-10-CM | POA: Diagnosis not present

## 2016-12-27 DIAGNOSIS — N183 Chronic kidney disease, stage 3 (moderate): Secondary | ICD-10-CM | POA: Diagnosis not present

## 2016-12-27 DIAGNOSIS — R1312 Dysphagia, oropharyngeal phase: Secondary | ICD-10-CM | POA: Diagnosis not present

## 2016-12-27 DIAGNOSIS — F0390 Unspecified dementia without behavioral disturbance: Secondary | ICD-10-CM | POA: Diagnosis not present

## 2016-12-27 DIAGNOSIS — M6281 Muscle weakness (generalized): Secondary | ICD-10-CM | POA: Diagnosis not present

## 2016-12-30 DIAGNOSIS — G20 Parkinson's disease: Secondary | ICD-10-CM | POA: Diagnosis not present

## 2016-12-30 DIAGNOSIS — N183 Chronic kidney disease, stage 3 (moderate): Secondary | ICD-10-CM | POA: Diagnosis not present

## 2016-12-30 DIAGNOSIS — R1312 Dysphagia, oropharyngeal phase: Secondary | ICD-10-CM | POA: Diagnosis not present

## 2016-12-30 DIAGNOSIS — M6281 Muscle weakness (generalized): Secondary | ICD-10-CM | POA: Diagnosis not present

## 2016-12-30 DIAGNOSIS — R278 Other lack of coordination: Secondary | ICD-10-CM | POA: Diagnosis not present

## 2016-12-30 DIAGNOSIS — F0390 Unspecified dementia without behavioral disturbance: Secondary | ICD-10-CM | POA: Diagnosis not present

## 2017-01-02 DIAGNOSIS — F0390 Unspecified dementia without behavioral disturbance: Secondary | ICD-10-CM | POA: Diagnosis not present

## 2017-01-02 DIAGNOSIS — N183 Chronic kidney disease, stage 3 (moderate): Secondary | ICD-10-CM | POA: Diagnosis not present

## 2017-01-02 DIAGNOSIS — R1312 Dysphagia, oropharyngeal phase: Secondary | ICD-10-CM | POA: Diagnosis not present

## 2017-01-02 DIAGNOSIS — M6281 Muscle weakness (generalized): Secondary | ICD-10-CM | POA: Diagnosis not present

## 2017-01-02 DIAGNOSIS — G20 Parkinson's disease: Secondary | ICD-10-CM | POA: Diagnosis not present

## 2017-01-02 DIAGNOSIS — R278 Other lack of coordination: Secondary | ICD-10-CM | POA: Diagnosis not present

## 2017-01-04 DIAGNOSIS — N183 Chronic kidney disease, stage 3 (moderate): Secondary | ICD-10-CM | POA: Diagnosis not present

## 2017-01-04 DIAGNOSIS — R1312 Dysphagia, oropharyngeal phase: Secondary | ICD-10-CM | POA: Diagnosis not present

## 2017-01-04 DIAGNOSIS — R278 Other lack of coordination: Secondary | ICD-10-CM | POA: Diagnosis not present

## 2017-01-04 DIAGNOSIS — F0390 Unspecified dementia without behavioral disturbance: Secondary | ICD-10-CM | POA: Diagnosis not present

## 2017-01-04 DIAGNOSIS — G20 Parkinson's disease: Secondary | ICD-10-CM | POA: Diagnosis not present

## 2017-01-04 DIAGNOSIS — M6281 Muscle weakness (generalized): Secondary | ICD-10-CM | POA: Diagnosis not present

## 2017-01-06 DIAGNOSIS — G20 Parkinson's disease: Secondary | ICD-10-CM | POA: Diagnosis not present

## 2017-01-06 DIAGNOSIS — R1312 Dysphagia, oropharyngeal phase: Secondary | ICD-10-CM | POA: Diagnosis not present

## 2017-01-06 DIAGNOSIS — M6281 Muscle weakness (generalized): Secondary | ICD-10-CM | POA: Diagnosis not present

## 2017-01-06 DIAGNOSIS — F0390 Unspecified dementia without behavioral disturbance: Secondary | ICD-10-CM | POA: Diagnosis not present

## 2017-01-06 DIAGNOSIS — R278 Other lack of coordination: Secondary | ICD-10-CM | POA: Diagnosis not present

## 2017-01-06 DIAGNOSIS — N183 Chronic kidney disease, stage 3 (moderate): Secondary | ICD-10-CM | POA: Diagnosis not present

## 2017-01-09 DIAGNOSIS — M6281 Muscle weakness (generalized): Secondary | ICD-10-CM | POA: Diagnosis not present

## 2017-01-09 DIAGNOSIS — R278 Other lack of coordination: Secondary | ICD-10-CM | POA: Diagnosis not present

## 2017-01-09 DIAGNOSIS — N183 Chronic kidney disease, stage 3 (moderate): Secondary | ICD-10-CM | POA: Diagnosis not present

## 2017-01-09 DIAGNOSIS — F0390 Unspecified dementia without behavioral disturbance: Secondary | ICD-10-CM | POA: Diagnosis not present

## 2017-01-09 DIAGNOSIS — R1312 Dysphagia, oropharyngeal phase: Secondary | ICD-10-CM | POA: Diagnosis not present

## 2017-01-09 DIAGNOSIS — G20 Parkinson's disease: Secondary | ICD-10-CM | POA: Diagnosis not present

## 2017-01-10 DIAGNOSIS — N183 Chronic kidney disease, stage 3 (moderate): Secondary | ICD-10-CM | POA: Diagnosis not present

## 2017-01-10 DIAGNOSIS — F0390 Unspecified dementia without behavioral disturbance: Secondary | ICD-10-CM | POA: Diagnosis not present

## 2017-01-10 DIAGNOSIS — R1312 Dysphagia, oropharyngeal phase: Secondary | ICD-10-CM | POA: Diagnosis not present

## 2017-01-10 DIAGNOSIS — G20 Parkinson's disease: Secondary | ICD-10-CM | POA: Diagnosis not present

## 2017-01-10 DIAGNOSIS — M6281 Muscle weakness (generalized): Secondary | ICD-10-CM | POA: Diagnosis not present

## 2017-01-10 DIAGNOSIS — R278 Other lack of coordination: Secondary | ICD-10-CM | POA: Diagnosis not present

## 2017-01-13 DIAGNOSIS — G20 Parkinson's disease: Secondary | ICD-10-CM | POA: Diagnosis not present

## 2017-01-13 DIAGNOSIS — N183 Chronic kidney disease, stage 3 (moderate): Secondary | ICD-10-CM | POA: Diagnosis not present

## 2017-01-13 DIAGNOSIS — R278 Other lack of coordination: Secondary | ICD-10-CM | POA: Diagnosis not present

## 2017-01-13 DIAGNOSIS — M6281 Muscle weakness (generalized): Secondary | ICD-10-CM | POA: Diagnosis not present

## 2017-01-13 DIAGNOSIS — R1312 Dysphagia, oropharyngeal phase: Secondary | ICD-10-CM | POA: Diagnosis not present

## 2017-01-13 DIAGNOSIS — F0390 Unspecified dementia without behavioral disturbance: Secondary | ICD-10-CM | POA: Diagnosis not present

## 2017-01-16 DIAGNOSIS — N183 Chronic kidney disease, stage 3 (moderate): Secondary | ICD-10-CM | POA: Diagnosis not present

## 2017-01-16 DIAGNOSIS — F0390 Unspecified dementia without behavioral disturbance: Secondary | ICD-10-CM | POA: Diagnosis not present

## 2017-01-16 DIAGNOSIS — M6281 Muscle weakness (generalized): Secondary | ICD-10-CM | POA: Diagnosis not present

## 2017-01-16 DIAGNOSIS — R278 Other lack of coordination: Secondary | ICD-10-CM | POA: Diagnosis not present

## 2017-01-16 DIAGNOSIS — G20 Parkinson's disease: Secondary | ICD-10-CM | POA: Diagnosis not present

## 2017-01-16 DIAGNOSIS — R1312 Dysphagia, oropharyngeal phase: Secondary | ICD-10-CM | POA: Diagnosis not present

## 2017-01-17 DIAGNOSIS — R1312 Dysphagia, oropharyngeal phase: Secondary | ICD-10-CM | POA: Diagnosis not present

## 2017-01-17 DIAGNOSIS — R278 Other lack of coordination: Secondary | ICD-10-CM | POA: Diagnosis not present

## 2017-01-17 DIAGNOSIS — G20 Parkinson's disease: Secondary | ICD-10-CM | POA: Diagnosis not present

## 2017-01-17 DIAGNOSIS — N183 Chronic kidney disease, stage 3 (moderate): Secondary | ICD-10-CM | POA: Diagnosis not present

## 2017-01-17 DIAGNOSIS — M6281 Muscle weakness (generalized): Secondary | ICD-10-CM | POA: Diagnosis not present

## 2017-01-17 DIAGNOSIS — F0390 Unspecified dementia without behavioral disturbance: Secondary | ICD-10-CM | POA: Diagnosis not present

## 2017-01-20 DIAGNOSIS — R062 Wheezing: Secondary | ICD-10-CM | POA: Diagnosis not present

## 2017-01-20 DIAGNOSIS — R0989 Other specified symptoms and signs involving the circulatory and respiratory systems: Secondary | ICD-10-CM | POA: Diagnosis not present

## 2017-01-21 DIAGNOSIS — D649 Anemia, unspecified: Secondary | ICD-10-CM | POA: Diagnosis not present

## 2017-03-23 DIAGNOSIS — D649 Anemia, unspecified: Secondary | ICD-10-CM | POA: Diagnosis not present

## 2017-04-10 DIAGNOSIS — M810 Age-related osteoporosis without current pathological fracture: Secondary | ICD-10-CM | POA: Diagnosis not present

## 2017-04-10 DIAGNOSIS — E119 Type 2 diabetes mellitus without complications: Secondary | ICD-10-CM | POA: Diagnosis not present

## 2017-04-10 DIAGNOSIS — Z9181 History of falling: Secondary | ICD-10-CM | POA: Diagnosis not present

## 2017-04-10 DIAGNOSIS — G20 Parkinson's disease: Secondary | ICD-10-CM | POA: Diagnosis not present

## 2017-04-11 DIAGNOSIS — D649 Anemia, unspecified: Secondary | ICD-10-CM | POA: Diagnosis not present

## 2017-04-11 DIAGNOSIS — D518 Other vitamin B12 deficiency anemias: Secondary | ICD-10-CM | POA: Diagnosis not present

## 2017-06-27 DIAGNOSIS — E119 Type 2 diabetes mellitus without complications: Secondary | ICD-10-CM | POA: Diagnosis not present

## 2017-06-27 DIAGNOSIS — I2510 Atherosclerotic heart disease of native coronary artery without angina pectoris: Secondary | ICD-10-CM | POA: Diagnosis not present

## 2017-06-27 DIAGNOSIS — F0280 Dementia in other diseases classified elsewhere without behavioral disturbance: Secondary | ICD-10-CM | POA: Diagnosis not present

## 2017-06-27 DIAGNOSIS — G20 Parkinson's disease: Secondary | ICD-10-CM | POA: Diagnosis not present

## 2017-06-28 DIAGNOSIS — E119 Type 2 diabetes mellitus without complications: Secondary | ICD-10-CM | POA: Diagnosis not present

## 2017-06-28 DIAGNOSIS — E169 Disorder of pancreatic internal secretion, unspecified: Secondary | ICD-10-CM | POA: Diagnosis not present

## 2017-07-27 DIAGNOSIS — D649 Anemia, unspecified: Secondary | ICD-10-CM | POA: Diagnosis not present

## 2017-07-27 DIAGNOSIS — D509 Iron deficiency anemia, unspecified: Secondary | ICD-10-CM | POA: Diagnosis not present

## 2017-10-08 DIAGNOSIS — R05 Cough: Secondary | ICD-10-CM | POA: Diagnosis not present

## 2017-10-08 DIAGNOSIS — R609 Edema, unspecified: Secondary | ICD-10-CM | POA: Diagnosis not present

## 2017-10-08 DIAGNOSIS — R509 Fever, unspecified: Secondary | ICD-10-CM | POA: Diagnosis not present

## 2017-10-09 ENCOUNTER — Other Ambulatory Visit: Payer: Self-pay

## 2017-10-09 ENCOUNTER — Emergency Department (HOSPITAL_COMMUNITY): Payer: Medicare Other

## 2017-10-09 ENCOUNTER — Inpatient Hospital Stay (HOSPITAL_COMMUNITY)
Admission: EM | Admit: 2017-10-09 | Discharge: 2017-10-11 | DRG: 640 | Disposition: A | Discharge status: Skilled Nursing Facility | Payer: Medicare Other | Attending: Internal Medicine | Admitting: Internal Medicine

## 2017-10-09 ENCOUNTER — Encounter (HOSPITAL_COMMUNITY): Payer: Self-pay | Admitting: Emergency Medicine

## 2017-10-09 DIAGNOSIS — R609 Edema, unspecified: Secondary | ICD-10-CM | POA: Diagnosis not present

## 2017-10-09 DIAGNOSIS — E119 Type 2 diabetes mellitus without complications: Secondary | ICD-10-CM

## 2017-10-09 DIAGNOSIS — F0280 Dementia in other diseases classified elsewhere without behavioral disturbance: Secondary | ICD-10-CM | POA: Diagnosis present

## 2017-10-09 DIAGNOSIS — S82101A Unspecified fracture of upper end of right tibia, initial encounter for closed fracture: Secondary | ICD-10-CM

## 2017-10-09 DIAGNOSIS — G309 Alzheimer's disease, unspecified: Secondary | ICD-10-CM | POA: Diagnosis present

## 2017-10-09 DIAGNOSIS — Z8673 Personal history of transient ischemic attack (TIA), and cerebral infarction without residual deficits: Secondary | ICD-10-CM | POA: Diagnosis not present

## 2017-10-09 DIAGNOSIS — S82201A Unspecified fracture of shaft of right tibia, initial encounter for closed fracture: Secondary | ICD-10-CM | POA: Diagnosis present

## 2017-10-09 DIAGNOSIS — H5461 Unqualified visual loss, right eye, normal vision left eye: Secondary | ICD-10-CM | POA: Diagnosis present

## 2017-10-09 DIAGNOSIS — M79661 Pain in right lower leg: Secondary | ICD-10-CM | POA: Diagnosis not present

## 2017-10-09 DIAGNOSIS — R296 Repeated falls: Secondary | ICD-10-CM

## 2017-10-09 DIAGNOSIS — Z7401 Bed confinement status: Secondary | ICD-10-CM

## 2017-10-09 DIAGNOSIS — D649 Anemia, unspecified: Secondary | ICD-10-CM

## 2017-10-09 DIAGNOSIS — Z8711 Personal history of peptic ulcer disease: Secondary | ICD-10-CM

## 2017-10-09 DIAGNOSIS — Z515 Encounter for palliative care: Secondary | ICD-10-CM | POA: Diagnosis not present

## 2017-10-09 DIAGNOSIS — E1122 Type 2 diabetes mellitus with diabetic chronic kidney disease: Secondary | ICD-10-CM | POA: Diagnosis present

## 2017-10-09 DIAGNOSIS — I10 Essential (primary) hypertension: Secondary | ICD-10-CM | POA: Diagnosis not present

## 2017-10-09 DIAGNOSIS — Z87442 Personal history of urinary calculi: Secondary | ICD-10-CM | POA: Diagnosis not present

## 2017-10-09 DIAGNOSIS — I129 Hypertensive chronic kidney disease with stage 1 through stage 4 chronic kidney disease, or unspecified chronic kidney disease: Secondary | ICD-10-CM | POA: Diagnosis present

## 2017-10-09 DIAGNOSIS — N183 Chronic kidney disease, stage 3 unspecified: Secondary | ICD-10-CM | POA: Diagnosis present

## 2017-10-09 DIAGNOSIS — Z7982 Long term (current) use of aspirin: Secondary | ICD-10-CM | POA: Diagnosis not present

## 2017-10-09 DIAGNOSIS — M25561 Pain in right knee: Secondary | ICD-10-CM | POA: Diagnosis not present

## 2017-10-09 DIAGNOSIS — I2510 Atherosclerotic heart disease of native coronary artery without angina pectoris: Secondary | ICD-10-CM | POA: Diagnosis present

## 2017-10-09 DIAGNOSIS — Z66 Do not resuscitate: Secondary | ICD-10-CM | POA: Diagnosis present

## 2017-10-09 DIAGNOSIS — G8911 Acute pain due to trauma: Secondary | ICD-10-CM | POA: Diagnosis not present

## 2017-10-09 DIAGNOSIS — E860 Dehydration: Secondary | ICD-10-CM | POA: Diagnosis present

## 2017-10-09 DIAGNOSIS — G250 Essential tremor: Secondary | ICD-10-CM | POA: Diagnosis present

## 2017-10-09 DIAGNOSIS — S8990XA Unspecified injury of unspecified lower leg, initial encounter: Secondary | ICD-10-CM | POA: Diagnosis not present

## 2017-10-09 DIAGNOSIS — X58XXXA Exposure to other specified factors, initial encounter: Secondary | ICD-10-CM | POA: Diagnosis present

## 2017-10-09 DIAGNOSIS — D509 Iron deficiency anemia, unspecified: Secondary | ICD-10-CM | POA: Diagnosis not present

## 2017-10-09 DIAGNOSIS — R402441 Other coma, without documented Glasgow coma scale score, or with partial score reported, in the field [EMT or ambulance]: Secondary | ICD-10-CM | POA: Diagnosis not present

## 2017-10-09 DIAGNOSIS — G20 Parkinson's disease: Secondary | ICD-10-CM | POA: Diagnosis present

## 2017-10-09 DIAGNOSIS — F0390 Unspecified dementia without behavioral disturbance: Secondary | ICD-10-CM | POA: Diagnosis present

## 2017-10-09 DIAGNOSIS — Z8249 Family history of ischemic heart disease and other diseases of the circulatory system: Secondary | ICD-10-CM

## 2017-10-09 DIAGNOSIS — R4182 Altered mental status, unspecified: Secondary | ICD-10-CM | POA: Diagnosis not present

## 2017-10-09 DIAGNOSIS — Z7189 Other specified counseling: Secondary | ICD-10-CM

## 2017-10-09 DIAGNOSIS — Z96651 Presence of right artificial knee joint: Secondary | ICD-10-CM | POA: Diagnosis present

## 2017-10-09 DIAGNOSIS — Z833 Family history of diabetes mellitus: Secondary | ICD-10-CM

## 2017-10-09 DIAGNOSIS — J690 Pneumonitis due to inhalation of food and vomit: Secondary | ICD-10-CM | POA: Diagnosis not present

## 2017-10-09 DIAGNOSIS — Y92129 Unspecified place in nursing home as the place of occurrence of the external cause: Secondary | ICD-10-CM

## 2017-10-09 DIAGNOSIS — K449 Diaphragmatic hernia without obstruction or gangrene: Secondary | ICD-10-CM | POA: Diagnosis present

## 2017-10-09 DIAGNOSIS — K219 Gastro-esophageal reflux disease without esophagitis: Secondary | ICD-10-CM

## 2017-10-09 DIAGNOSIS — R1310 Dysphagia, unspecified: Secondary | ICD-10-CM | POA: Diagnosis not present

## 2017-10-09 DIAGNOSIS — S82201D Unspecified fracture of shaft of right tibia, subsequent encounter for closed fracture with routine healing: Secondary | ICD-10-CM | POA: Diagnosis not present

## 2017-10-09 DIAGNOSIS — F0391 Unspecified dementia with behavioral disturbance: Secondary | ICD-10-CM | POA: Diagnosis not present

## 2017-10-09 LAB — TROPONIN I: Troponin I: 0.1 ng/mL (ref <0.03)

## 2017-10-09 LAB — CBC WITH DIFFERENTIAL/PLATELET
Basophils Absolute: 0 10*3/uL (ref 0.0–0.1)
Basophils Relative: 0 %
EOS ABS: 0 10*3/uL (ref 0.0–0.7)
EOS PCT: 0 %
HCT: 21.5 % — ABNORMAL LOW (ref 36.0–46.0)
Hemoglobin: 7.3 g/dL — ABNORMAL LOW (ref 12.0–15.0)
Lymphocytes Relative: 11 %
Lymphs Abs: 1.7 10*3/uL (ref 0.7–4.0)
MCH: 35.4 pg — ABNORMAL HIGH (ref 26.0–34.0)
MCHC: 34 g/dL (ref 30.0–36.0)
MCV: 104.4 fL — ABNORMAL HIGH (ref 78.0–100.0)
Monocytes Absolute: 1.5 10*3/uL — ABNORMAL HIGH (ref 0.1–1.0)
Monocytes Relative: 10 %
Neutro Abs: 12.2 10*3/uL — ABNORMAL HIGH (ref 1.7–7.7)
Neutrophils Relative %: 79 %
PLATELETS: 168 10*3/uL (ref 150–400)
RBC: 2.06 MIL/uL — AB (ref 3.87–5.11)
RDW: 14.6 % (ref 11.5–15.5)
WBC: 15.4 10*3/uL — AB (ref 4.0–10.5)

## 2017-10-09 LAB — COMPREHENSIVE METABOLIC PANEL
ALK PHOS: 49 U/L (ref 38–126)
ALT: 17 U/L (ref 14–54)
AST: 26 U/L (ref 15–41)
Albumin: 3.7 g/dL (ref 3.5–5.0)
Anion gap: 8 (ref 5–15)
BILIRUBIN TOTAL: 0.8 mg/dL (ref 0.3–1.2)
BUN: 42 mg/dL — AB (ref 6–20)
CALCIUM: 9 mg/dL (ref 8.9–10.3)
CO2: 23 mmol/L (ref 22–32)
Chloride: 113 mmol/L — ABNORMAL HIGH (ref 101–111)
Creatinine, Ser: 1.17 mg/dL — ABNORMAL HIGH (ref 0.44–1.00)
GFR calc Af Amer: 46 mL/min — ABNORMAL LOW (ref >60)
GFR, EST NON AFRICAN AMERICAN: 40 mL/min — AB (ref >60)
Glucose, Bld: 175 mg/dL — ABNORMAL HIGH (ref 65–99)
Potassium: 3.6 mmol/L (ref 3.5–5.1)
Sodium: 144 mmol/L (ref 135–145)
TOTAL PROTEIN: 6.6 g/dL (ref 6.5–8.1)

## 2017-10-09 LAB — PROTIME-INR
INR: 1.1
Prothrombin Time: 14.1 seconds (ref 11.4–15.2)

## 2017-10-09 LAB — I-STAT CG4 LACTIC ACID, ED: LACTIC ACID, VENOUS: 1.22 mmol/L (ref 0.5–1.9)

## 2017-10-09 MED ORDER — CITALOPRAM HYDROBROMIDE 20 MG PO TABS
10.0000 mg | ORAL_TABLET | Freq: Every day | ORAL | Status: DC
  Administered 2017-10-10 – 2017-10-11 (×2): 10 mg via ORAL
  Filled 2017-10-09 (×2): qty 1

## 2017-10-09 MED ORDER — POLYETHYLENE GLYCOL 3350 17 GM/SCOOP PO POWD: 17.0000 g | Freq: Every day | ORAL | Status: DC | PRN

## 2017-10-09 MED ORDER — ACETAMINOPHEN 160 MG/5ML PO SOLN
650.0000 mg | Freq: Three times a day (TID) | ORAL | Status: DC
  Administered 2017-10-09 – 2017-10-11 (×6): 650 mg via ORAL
  Filled 2017-10-09 (×6): qty 20.3

## 2017-10-09 MED ORDER — SODIUM CHLORIDE 0.9 % IV BOLUS (SEPSIS)
1000.0000 mL | Freq: Once | INTRAVENOUS | Status: AC
  Administered 2017-10-09: 1000 mL via INTRAVENOUS

## 2017-10-09 MED ORDER — ONDANSETRON HCL 4 MG/2ML IJ SOLN: 4.0000 mg | Freq: Four times a day (QID) | INTRAMUSCULAR | Status: DC | PRN

## 2017-10-09 MED ORDER — MORPHINE SULFATE (PF) 4 MG/ML IV SOLN
1.0000 mg | INTRAVENOUS | Status: DC | PRN
  Administered 2017-10-11: 1 mg via INTRAVENOUS
  Filled 2017-10-09: qty 1

## 2017-10-09 MED ORDER — RANITIDINE HCL 150 MG/10ML PO SYRP
150.0000 mg | ORAL_SOLUTION | Freq: Every day | ORAL | Status: DC
  Administered 2017-10-09 – 2017-10-10 (×2): 150 mg via ORAL
  Filled 2017-10-09 (×2): qty 10

## 2017-10-09 MED ORDER — POLYETHYLENE GLYCOL 3350 17 G PO PACK: 17.0000 g | PACK | Freq: Every day | ORAL | Status: DC | PRN

## 2017-10-09 MED ORDER — CITALOPRAM HYDROBROMIDE 10 MG/5ML PO SOLN: 10.0000 mg | Freq: Every day | ORAL | Status: DC

## 2017-10-09 MED ORDER — SODIUM CHLORIDE 0.9% FLUSH: 3.0000 mL | Freq: Two times a day (BID) | INTRAVENOUS | Status: DC

## 2017-10-09 MED ORDER — ENOXAPARIN SODIUM 40 MG/0.4ML ~~LOC~~ SOLN: 40.0000 mg | SUBCUTANEOUS | Status: DC

## 2017-10-09 MED ORDER — ENOXAPARIN SODIUM 30 MG/0.3ML ~~LOC~~ SOLN
30.0000 mg | SUBCUTANEOUS | Status: DC
  Administered 2017-10-09: 30 mg via SUBCUTANEOUS
  Filled 2017-10-09: qty 0.3

## 2017-10-09 MED ORDER — ONDANSETRON HCL 4 MG PO TABS: 4.0000 mg | ORAL_TABLET | Freq: Four times a day (QID) | ORAL | Status: DC | PRN

## 2017-10-09 MED ORDER — DEXTROSE-NACL 5-0.9 % IV SOLN
INTRAVENOUS | Status: DC
  Administered 2017-10-09: 14:00:00 via INTRAVENOUS

## 2017-10-09 NOTE — ED Notes (Signed)
Made ortho tech aware of knee immobilizer needed.

## 2017-10-09 NOTE — ED Triage Notes (Signed)
Patient brought in by [facility] from [facility] for right tibia fracture that was confirmed by xray. Patient had swelling and bruising to right lower leg since Saturday so got xray done yesterday of right leg, also had chest xray that showed PNA and patient started on Rocephin shots daily. Per staff at SNF patient had no falls or injuries, so they arent certain how right leg got fractured.

## 2017-10-09 NOTE — ED Notes (Signed)
ED Provider at bedside. 

## 2017-10-09 NOTE — ED Provider Notes (Addendum)
[facility] DEPT Provider Note   CSN: [id number] Arrival date & time: 10/09/17  5573     History   Chief Complaint Chief Complaint  Patient presents with  . Leg Injury    right    HPI [name] [name] is a 83 y.o. female.  83 year old female with a history of dementia, DJD, arthritis, diabetes, hypertension, HLD, and CVA presents from [facility] for evaluation of possible right fibula fracture.  Patient was x-rays done yesterday reportedly show a right fibular fracture.  Patient also was diagnosed yesterday with a pneumonia.  She is currently receiving Rocephin IM (started yesterday).  Due to her dementia she is not a reliable historian.  It is unclear how she might have obtained a RLE fracture.   She is awake and appears uncomfortable with movement of RLE. She does not answer questions. She appears to be a full code.   The history is provided by medical records. The history is limited by the condition of the patient.  Illness  This is a new problem. Episode onset: uncertain. The problem occurs constantly. The problem has not changed since onset.Pertinent negatives include no chest pain and no abdominal pain. Nothing aggravates the symptoms. Nothing relieves the symptoms.    Past Medical History:  Diagnosis Date  . Alzheimer disease   . Alzheimer's disease   . ASCVD (arteriosclerotic cardiovascular disease) 2003   Single vessel disease-60% D1  . Cervical spondylarthritis    S/p discectomy  . Cervical spondylarthritis   . Coronary artery disease   . Degenerative joint disease    right TKA in 4/06  . Degenerative joint disease   . Dementia    PT RESIDES AT [facility] - SHE TALKS SOME - DOES NOT AMBULATE- TRANSFERS TO W/C WITH 2 ASSISTANTS  . Diabetes mellitus    No insulin- NO MEDS - LOST WEIGHT - NO LONGER HAS ELEVATED SUGARS  . Diverticulosis   . Diverticulosis     . Gastroesophageal reflux disease with hiatal hernia   . GI bleed   . Hiatal hernia   . History of nephrolithiasis   . Hyperlipidemia   . Hyperlipidemia   . Hypertension   . Leukocytosis   . Leukopenia    and anemia-hematology evaluation was nodiagnostic  . Lung nodule    Left lower lobe  . Osteoporosis   . Parkinson's disease    PRESUMED BECAUSE SHE HAS A LOT OF SHAKING - BUT HAS SEEN NEUROLOGIST  AND WAS PUT ON MYSOLINE AND FAMILY HAS SEEN BIG IMPROVEMENT IN THE SHAKING  . Retinal artery branch occlusion of right eye    Sudden onset of right eye blindness-embolism suspected  . Senile dementia   . Shortness of breath    AND WHEEZING WITH ACTIVITY AND AT REST  . Stroke ([place])    RIGHT EYE - IS BLIND IN RT EYE  . Upper GI bleed    gastric stress ulcer in 2003    Patient Active Problem List   Diagnosis Date Noted  . Sepsis ([place]) 03/28/2015  . Pubic ramus fracture ([place]) 03/28/2015  . Anemia, iron deficiency 03/26/2015  . Aspiration pneumonia ([place]) 03/26/2015  . Closed head injury   . Fever presenting with conditions classified elsewhere 03/24/2015  . Acute renal failure superimposed on stage 3 chronic kidney disease ([place]) 03/24/2015  . Scalp laceration   . Faintness   . Fall 03/22/2015  . Syncope and collapse 03/22/2015  .  Atypical chest pain 03/22/2015  . Venous stasis dermatitis of both lower extremities 07/18/2014  . Renal stone 03/25/2014  . Wheezing 01/10/2014  . Persistent recurrent vomiting 10/30/2013  . Leukocytosis 10/25/2013  . CKD (chronic kidney disease) stage 3, GFR 30-59 ml/min (HCC) 10/25/2013  . Xerosis of skin 09/06/2013  . Benign essential tremor 09/06/2013  . Senile dementia 06/24/2011  . Intracranial bleed ([place]) 06/23/2011  . Frequent falls 06/23/2011  . ASCVD (arteriosclerotic cardiovascular disease)   . Type II diabetes mellitus ([place])   . Degenerative joint disease   . Retinal artery branch occlusion of right eye   . Gastroesophageal reflux  disease with hiatal hernia   . HYPERLIPIDEMIA 01/16/2008  . PARKINSON'S DISEASE 01/16/2008  . Essential hypertension 01/16/2008  . NEPHROLITHIASIS 01/16/2008    Past Surgical History:  Procedure Laterality Date  . CERVICAL DISCECTOMY     Dr. Sherwood Gambler  . CHOLECYSTECTOMY    . COLONOSCOPY  2009  . CYSTOSCOPY WITH  LEFT URETEROSCOPY AND LEFT RETROGRADE  STENT PLACEMENT Left 03/25/2014   Performed by Malka So, MD at [facility] ORS  . CYSTOSCOPY WITH RETROGRADE PYELOGRAM/URETERAL STENT PLACEMENT Right 09/23/2011   Performed by Beaulah Dinning I, MD at [facility] ORS  . ESOPHAGOGASTRODUODENOSCOPY (EGD) N/A 10/31/2013   Performed by Rogene Houston, MD at [facility]  . HOLMIUM LASER APPLICATION  2/83/1517   Performed by Malka So, MD at [facility] ORS  . HOLMIUM LASER APPLICATION Left 04/21/6072   Performed by Malka So, MD at [facility] ORS  . JOINT REPLACEMENT    . LEFT SECOND STAGE URETEORSCOPY STONE EXTRACTION Left 04/10/2014   Performed by Malka So, MD at [facility] ORS  . TOTAL ABDOMINAL HYSTERECTOMY    . TOTAL KNEE ARTHROPLASTY  2006   Right; Dr. Percell Miller  . VASCULAR SURGERY     LASER OF RT EYE AFTER EYE STROKE    OB History    No data available       Home Medications    Prior to Admission medications   Medication Sig Start Date End Date Taking? Authorizing Provider  acetaminophen (Q-PAP) 500 MG tablet Take 1 tablet (500 mg total) by mouth 3 (three) times daily. Patient taking differently: Take 500 mg by mouth QID.  03/28/15   Janece Canterbury, MD  ASPIRIN LOW DOSE 81 MG EC tablet TAKE 1 TABLET BY MOUTH ONCE DAILY. 12/25/14   Kathyrn Drown, MD  citalopram (CELEXA) 20 MG tablet TAKE 1 TABLET BY MOUTH ONCE DAILY.(FOR DEPRESSION) 02/10/15   Kathyrn Drown, MD  Difluprednate (DUREZOL) 0.05 % EMUL Apply 1 drop to eye at bedtime. 1 drop in the right eye    [provider]  donepezil (ARICEPT) 5 MG tablet TAKE 1 TABLET BY MOUTH EACH MORNING. 02/17/15   Kathyrn Drown, MD  feeding supplement,  RESOURCE BREEZE, (RESOURCE BREEZE) LIQD Take 1 Container by mouth daily. 03/27/15   Janece Canterbury, MD  memantine (NAMENDA) 10 MG tablet TAKE 1 TABLET BY MOUTH TWICE DAILY. 12/15/14   Kathyrn Drown, MD  Multiple Vitamins-Minerals (MULTIVITAMIN) tablet Take 1 tablet by mouth daily. 03/27/15   Janece Canterbury, MD  omeprazole (PRILOSEC) 20 MG capsule TAKE 1 CAPSULE BY MOUTH TWICE DAILY. 10/27/14   Rehman, Mechele Dawley, MD  polyethylene glycol powder (GLYCOLAX/MIRALAX) powder MIX 1 CAPFUL (17G) IN 8 OUNCES OF JUICE/WATER AND DRINK ONCE DAILY AS NEEDED FOR MILD CONSTIPATION 03/28/15   Short, Noah Delaine, MD  polyvinyl alcohol (LIQUIFILM TEARS) 1.4 % ophthalmic  solution Place 1 drop into both eyes 3 (three) times daily.    [provider]  primidone (MYSOLINE) 50 MG tablet TAKE 1/2 TABLET (25mg ) BY MOUTH AT BEDTIME. 01/14/15   Mikey Kirschner, MD  Wheat Dextrin (BENEFIBER DRINK MIX) PACK Take 4 g by mouth at bedtime. Patient taking differently: Take 4 g by mouth at bedtime. MIX ONE TEASPOONFUL IN 8 OUNCES WATER/JUICE 01/06/15   Rogene Houston, MD    Family History Family History  Problem Relation Age of Onset  . Diabetes Sister   . Heart disease Sister   . Diabetes Brother     Social History Social History   Tobacco Use  . Smoking status: Never Smoker  . Smokeless tobacco: Never Used  Substance Use Topics  . Alcohol use: No    Alcohol/week: 0.0 oz  . Drug use: No     Allergies   Band-aid liquid bandage [dermagran]; Codeine; Dilaudid [hydromorphone hcl]; Morphine; and Other   Review of Systems Review of Systems  Unable to perform ROS: Dementia  Cardiovascular: Negative for chest pain.  Gastrointestinal: Negative for abdominal pain.     Physical Exam Updated Vital Signs BP (!) 138/116   Pulse 85   Temp 100 F (37.8 C) (Rectal)   Resp (!) 22   Wt 61.4 kg (135 lb 7 oz)   SpO2 93%   BMI 26.45 kg/m   Physical Exam  Constitutional: No distress.  Demented, bed bound  elderly female in NAD   HENT:  Head: Normocephalic and atraumatic.  OP dry   Eyes: Conjunctivae and EOM are normal.  Neck: Normal range of motion. Neck supple.  Cardiovascular: Normal rate, regular rhythm and normal heart sounds.  No murmur heard. Pulmonary/Chest: Effort normal and breath sounds normal. No respiratory distress.  Abdominal: Soft. She exhibits no distension. There is no tenderness.  Musculoskeletal: She exhibits no edema.  Ecchymosis and tenderness to RLE - mid anterior shin - Distal RLE is NVI   Neurological: She is alert.  Demented  Awake   Responds to pain  Does not answer questions   Skin: Skin is warm and dry.  Psychiatric: She has a normal mood and affect.  Nursing note and vitals reviewed.    ED Treatments / Results  Labs (all labs ordered are listed, but only abnormal results are displayed) Labs Reviewed  CULTURE, BLOOD (ROUTINE X 2)  CULTURE, BLOOD (ROUTINE X 2)  LACTIC ACID, PLASMA  LACTIC ACID, PLASMA  TROPONIN I  COMPREHENSIVE METABOLIC PANEL  CBC WITH DIFFERENTIAL/PLATELET  PROTIME-INR  URINALYSIS, ROUTINE W REFLEX MICROSCOPIC    EKG  EKG Interpretation  Date/Time:  Monday October 09 2017 08:41:19 EST Ventricular Rate:  89 PR Interval:    QRS Duration: 151 QT Interval:  395 QTC Calculation: 481 R Axis:   4 Text Interpretation:  Sinus rhythm Prolonged PR interval Left bundle branch block Confirmed by Dene Gentry [phone]) on 10/09/2017 9:45:36 AM       Radiology No results found.  Procedures Procedures (including critical care time)  Medications Ordered in ED Medications - No data to display   Initial Impression / Assessment and Plan / ED Course  I have reviewed the triage vital signs and the nursing notes.  Pertinent labs & imaging results that were available during my care of the patient were reviewed by me and considered in my medical decision making (see chart for details).     MSE complete  1215 Case with  Dr. Veverly Fells from orthopedics.  Patient to receive a knee immobilizer.  Given patient's nonambulatory status it is unlikely that operative repair is required.  Given anemia, dehydration, and possible occult pneumonia - in addition to the patient's right tibia fracture - I will admit for further workup and treatment.  Hospitalist service is aware of the case and will evaluate for admission.  Final Clinical Impressions(s) / ED Diagnoses   Final diagnoses:  Closed fracture of proximal end of right tibia, unspecified fracture morphology, initial encounter  Dehydration  Anemia, unspecified type    ED Discharge Orders    None       Valarie Merino, MD 10/09/17 1244    Valarie Merino, MD 10/09/17 1726

## 2017-10-09 NOTE — ED Notes (Signed)
Bed: XQ11 Expected date:  Expected time:  Means of arrival:  Comments: EMS-fracture

## 2017-10-09 NOTE — ED Notes (Signed)
Pt is connected to [facility]

## 2017-10-09 NOTE — H&P (Signed)
History and Physical    DEVRI KREHER RSW:[id number] DOB: 18-Feb-1924 DOA: 10/09/2017  PCP: Kathyrn Drown, MD   Patient coming from:  SNF  Chief Complaint: Right leg pain and difficulty breathing.   HPI: [name] [name] is a 83 y.o. female with medical history significant of advanced dementia, she is nonverbal and nonambulatory, all information obtained from her family at the bedside. For last 2-1/2 years she is a resident at a skilled nursing facility, she is known ambulatory nonverbal due to her advanced dementia, she does have difficulty swallowing, she is liquid diet and requires assistance with feedings. For last 48 hours she was noted to have pain on her right lower extremity while being moved, 24 hours ago she was noticed to be congested and dyspneic. Last night workup at the skilled nursing facility included chest x-ray which showed left lower lobe infiltrate and right tibial fracture, she was placed on oral antibiotics and this morning she was transferred to the emergency room for further evaluation.  Patient's family is aware of patient's severe swallowing dysfunction and risk of aspiration pneumonia, they have decided not to pursue any aggressive therapy in terms of tube feeds.    ED Course: Patient was evaluated and concluded to be dehydrated and anemic. Orthopedic evaluation recommended nonoperative management. Patient was referred for further hydration and pain control.   Review of Systems: Information obtained for patient's family members at the bedside 1. General: No fevers, positive chills, no weight gain or weight loss 2. ENT: No runny nose or sore throat, no hearing disturbances 3. Pulmonary: Positive dyspnea, no cough, wheezing, or hemoptysis. Positive congestion.  4. Cardiovascular: No angina, claudication, lower extremity edema, pnd or orthopnea 5. Gastrointestinal: No nausea or vomiting, no diarrhea or constipation 6. Hematology: No easy bruisability or frequent  infections 7. Urology: No dysuria, hematuria or increased urinary frequency 8. Dermatology: No rashes. 9. Neurology: No seizures or paresthesias 10. Musculoskeletal: right leg pain, severe, with passive movement.   Past Medical History:  Diagnosis Date  . Alzheimer disease   . Alzheimer's disease   . ASCVD (arteriosclerotic cardiovascular disease) 2003   Single vessel disease-60% D1  . Cervical spondylarthritis    S/p discectomy  . Cervical spondylarthritis   . Coronary artery disease   . Degenerative joint disease    right TKA in 4/06  . Degenerative joint disease   . Dementia    PT RESIDES AT [facility] - SHE TALKS SOME - DOES NOT AMBULATE- TRANSFERS TO W/C WITH 2 ASSISTANTS  . Diabetes mellitus    No insulin- NO MEDS - LOST WEIGHT - NO LONGER HAS ELEVATED SUGARS  . Diverticulosis   . Diverticulosis   . Gastroesophageal reflux disease with hiatal hernia   . GI bleed   . Hiatal hernia   . History of nephrolithiasis   . Hyperlipidemia   . Hyperlipidemia   . Hypertension   . Leukocytosis   . Leukopenia    and anemia-hematology evaluation was nodiagnostic  . Lung nodule    Left lower lobe  . Osteoporosis   . Parkinson's disease    PRESUMED BECAUSE SHE HAS A LOT OF SHAKING - BUT HAS SEEN NEUROLOGIST  AND WAS PUT ON MYSOLINE AND FAMILY HAS SEEN BIG IMPROVEMENT IN THE SHAKING  . Retinal artery branch occlusion of right eye    Sudden onset of right eye blindness-embolism suspected  . Senile dementia   . Shortness of breath  AND WHEEZING WITH ACTIVITY AND AT REST  . Stroke ([place])    RIGHT EYE - IS BLIND IN RT EYE  . Upper GI bleed    gastric stress ulcer in 2003    Past Surgical History:  Procedure Laterality Date  . CERVICAL DISCECTOMY     Dr. Sherwood Gambler  . CHOLECYSTECTOMY    . COLONOSCOPY  2009  . CYSTOSCOPY WITH  LEFT URETEROSCOPY AND LEFT RETROGRADE  STENT PLACEMENT Left 03/25/2014   Performed by  Malka So, MD at [facility] ORS  . CYSTOSCOPY WITH RETROGRADE PYELOGRAM/URETERAL STENT PLACEMENT Right 09/23/2011   Performed by Beaulah Dinning I, MD at [facility] ORS  . ESOPHAGOGASTRODUODENOSCOPY (EGD) N/A 10/31/2013   Performed by Rogene Houston, MD at [place]  . HOLMIUM LASER APPLICATION  02/14/7123   Performed by Malka So, MD at [facility]  . HOLMIUM LASER APPLICATION Left 03/28/997   Performed by Malka So, MD at [facility] ORS  . JOINT REPLACEMENT    . LEFT SECOND STAGE URETEORSCOPY STONE EXTRACTION Left 04/10/2014   Performed by Malka So, MD at [facility] ORS  . TOTAL ABDOMINAL HYSTERECTOMY    . TOTAL KNEE ARTHROPLASTY  2006   Right; Dr. Percell Miller  . VASCULAR SURGERY     LASER OF RT EYE AFTER EYE STROKE     reports that  has never smoked. she has never used smokeless tobacco. She reports that she does not drink alcohol or use drugs.  Allergies  Allergen Reactions  . Band-Aid Liquid Bandage [Dermagran]     Use non-stick gauze with paper tape  . Codeine Other (See Comments)    REACTION: Unknown  . Dilaudid [Hydromorphone Hcl]     Caused agitation, hallucinations, could not sleep, pt made funny noises like laughing / crying.  . Morphine Nausea And Vomiting  . Other     NO CITRUS, TOMATO BASED PRODUCTS OR CHOCOLATE - BECAUSE OF REFLUX PT'S FAMILY STATES THAT ANY TIME PT IS GIVEN NARCOTICS SHE HALLUCINATES AND TRIES TO GET UP OUT W/C OR BED Medications have to be crushed and put in apple sauce.    Family History  Problem Relation Age of Onset  . Diabetes Sister   . Heart disease Sister   . Diabetes Brother      Prior to Admission medications   Medication Sig Start Date End Date Taking? Authorizing Provider  acetaminophen (TYLENOL) 160 MG/5ML liquid Take 480 mg 3 (three) times daily by mouth.   Yes [provider]  ASPIRIN LOW DOSE 81 MG EC tablet TAKE 1 TABLET BY MOUTH ONCE DAILY. 12/25/14  Yes Kathyrn Drown, MD  cefTRIAXone (ROCEPHIN) 1 g injection Inject 1 g daily into  the muscle. x7 days--for wheezing 10/08/17 10/14/17 Yes [provider]  citalopram (CELEXA) 10 MG/5ML suspension Take 10 mg daily by mouth.   Yes [provider]  Guar Gum (NUTRISOURCE FIBER) POWD Take 1 Scoop every evening by mouth.   Yes [provider]  influenza vac split quadrivalent (FLUZONE QUADRIVALENT) injection Inject 0.5 mLs once into the muscle.   Yes [provider]  polyethylene glycol powder (GLYCOLAX/MIRALAX) powder MIX 1 CAPFUL (17G) IN 8 OUNCES OF JUICE/WATER AND DRINK ONCE DAILY AS NEEDED FOR MILD CONSTIPATION Patient taking differently: Take 17 g daily as needed by mouth for mild constipation. MIX 1 CAPFUL (17G) IN 8 OUNCES OF JUICE/WATER AND DRINK ONCE DAILY AS NEEDED FOR MILD CONSTIPATION 03/28/15  Yes Short, Noah Delaine, MD  ranitidine (ZANTAC)  150 MG/10ML syrup Take 150 mg at bedtime by mouth.   Yes [provider]  acetaminophen (Q-PAP) 500 MG tablet Take 1 tablet (500 mg total) by mouth 3 (three) times daily. Patient not taking: Reported on 14-Oct-2017 03/28/15   Janece Canterbury, MD  citalopram (CELEXA) 20 MG tablet TAKE 1 TABLET BY MOUTH ONCE DAILY.(FOR DEPRESSION) Patient not taking: Reported on 2017-10-14 02/10/15   Kathyrn Drown, MD  donepezil (ARICEPT) 5 MG tablet TAKE 1 TABLET BY MOUTH EACH MORNING. Patient not taking: Reported on Oct 14, 2017 02/17/15   Kathyrn Drown, MD  feeding supplement, RESOURCE BREEZE, (RESOURCE BREEZE) LIQD Take 1 Container by mouth daily. Patient not taking: Reported on Oct 14, 2017 03/27/15   Janece Canterbury, MD  memantine (NAMENDA) 10 MG tablet TAKE 1 TABLET BY MOUTH TWICE DAILY. Patient not taking: Reported on 10/14/2017 12/15/14   Kathyrn Drown, MD  Multiple Vitamins-Minerals (MULTIVITAMIN) tablet Take 1 tablet by mouth daily. Patient not taking: Reported on 10/14/17 03/27/15   Janece Canterbury, MD  omeprazole (PRILOSEC) 20 MG capsule TAKE 1 CAPSULE BY MOUTH TWICE DAILY. Patient not taking:  Reported on Oct 14, 2017 10/27/14   Rogene Houston, MD  primidone (MYSOLINE) 50 MG tablet TAKE 1/2 TABLET (25mg ) BY MOUTH AT BEDTIME. Patient not taking: Reported on 2017-10-14 01/14/15   Mikey Kirschner, MD  Wheat Dextrin (BENEFIBER DRINK MIX) PACK Take 4 g by mouth at bedtime. Patient not taking: Reported on 10/14/2017 01/06/15   Rogene Houston, MD    Physical Exam: Vitals:   10/14/17 0945 14-Oct-2017 1000 Oct 14, 2017 1100 10/14/17 1130  BP:  (!) 136/98 (!) 117/104 106/75  Pulse:  80 82 83  Resp:  (!) 25 (!) 26 (!) 23  Temp:      TempSrc:      SpO2:  93% 93% 93%  Weight:      Height: 5' 0.5" (1.537 m)       Constitutional: deconditioned and ill looking appering Vitals:   10-14-2017 0945 10/14/17 1000 Oct 14, 2017 1100 14-Oct-2017 1130  BP:  (!) 136/98 (!) 117/104 106/75  Pulse:  80 82 83  Resp:  (!) 25 (!) 26 (!) 23  Temp:      TempSrc:      SpO2:  93% 93% 93%  Weight:      Height: 5' 0.5" (1.537 m)      Eyes: PERRL, lids and conjunctivae pale Head normocephalic, nose and ears with no deformities.  ENMT: Mucous membranes are dry. Posterior pharynx clear of any exudate or lesions.  Neck: normal, supple, no masses, no thyromegaly Respiratory:  Decreased breath sounds auscultation bilaterally at bases, no wheezing, no crackles. Decreased respiratory effort. No accessory muscle use.  Cardiovascular: Regular rate and rhythm, no murmurs / rubs / gallops. Non pitting lower  extremity edema. 2+ pedal pulses. No carotid bruits.  Abdomen: no tenderness, no masses palpated. No hepatosplenomegaly. Bowel sounds positive.  Musculoskeletal: no clubbing / cyanosis. No joint deformity upper and lower extremities. Good ROM, no contractures. Normal muscle tone.  Skin: no rashes, lesions, ulcers. No induration Neurologic: Contracted upper extremities/ flexion. Not verbal, no following commands.     Labs on Admission: I have personally reviewed following labs and imaging studies  CBC: Recent Labs    Lab 2017-10-14 0856  WBC 15.4*  NEUTROABS 12.2*  HGB 7.3*  HCT 21.5*  MCV 104.4*  PLT 621   Basic Metabolic Panel: Recent Labs  Lab 10/14/17 0856  NA 144  K 3.6  CL 113*  CO2 23  GLUCOSE 175*  BUN 42*  CREATININE 1.17*  CALCIUM 9.0   GFR: Estimated Creatinine Clearance: 26.5 mL/min (A) (by C-G formula based on SCr of 1.17 mg/dL (H)). Liver Function Tests: Recent Labs  Lab 10/09/17 0856  AST 26  ALT 17  ALKPHOS 49  BILITOT 0.8  PROT 6.6  ALBUMIN 3.7   No results for input(s): LIPASE, AMYLASE in the last 168 hours. No results for input(s): AMMONIA in the last 168 hours. Coagulation Profile: Recent Labs  Lab 10/09/17 0856  INR 1.10   Cardiac Enzymes: Recent Labs  Lab 10/09/17 0856  TROPONINI 0.10*   BNP (last 3 results) No results for input(s): PROBNP in the last 8760 hours. HbA1C: No results for input(s): HGBA1C in the last 72 hours. CBG: No results for input(s): GLUCAP in the last 168 hours. Lipid Profile: No results for input(s): CHOL, HDL, LDLCALC, TRIG, CHOLHDL, LDLDIRECT in the last 72 hours. Thyroid Function Tests: No results for input(s): TSH, T4TOTAL, FREET4, T3FREE, THYROIDAB in the last 72 hours. Anemia Panel: No results for input(s): VITAMINB12, FOLATE, FERRITIN, TIBC, IRON, RETICCTPCT in the last 72 hours. Urine analysis:    Component Value Date/Time   COLORURINE YELLOW 03/23/2015 [place] 03/23/2015 1618   LABSPEC 1.020 03/23/2015 1618   PHURINE 6.5 03/23/2015 1618   GLUCOSEU NEGATIVE 03/23/2015 1618   HGBUR NEGATIVE 03/23/2015 1618   BILIRUBINUR NEGATIVE 03/23/2015 1618   KETONESUR NEGATIVE 03/23/2015 1618   PROTEINUR NEGATIVE 03/23/2015 1618   UROBILINOGEN 0.2 03/23/2015 1618   NITRITE NEGATIVE 03/23/2015 1618   LEUKOCYTESUR NEGATIVE 03/23/2015 1618    Radiological Exams on Admission: Dg Knee 2 Views Right  Result Date: 10/09/2017 CLINICAL DATA:  Increased right knee pain. Abnormal appearance of the knee  on a right tibia and fibula series of today's study. EXAM: RIGHT KNEE - 1-2 VIEW COMPARISON:  Right tibia and fibula radiograph of today's date and perioperative right knee series of February 15, 2007 FINDINGS: There is an acute fracture involving the anterior and medial aspects of the tibial metaphysis. Some involvement laterally is suspected but not well demonstrated. There is avulsion of the tibial tuberosity. The proximal fibula is grossly intact. The interface of the tibial portion of the knee prosthesis with the surrounding native bone appears normal. The distal femur and the femoral portion of the prosthesis also appear normal. IMPRESSION: There is an acute fracture of the tibial metaphysis anteriorly, medially, and possibly laterally. The fracture line is at and just inferior to the prosthesis and its interface with the native bone. Electronically Signed   By: David  Martinique M.D.   On: 10/09/2017 11:01   Dg Tibia/fibula Right  Result Date: 10/09/2017 CLINICAL DATA:  Right lower leg pain and swelling. No trauma history known. EXAM: RIGHT TIBIA AND FIBULA - 2 VIEW COMPARISON:  Ankle films 06/22/2011.  Knee films 02/15/2015. FINDINGS: Vascular calcifications. Diffuse soft tissue swelling. Total knee arthroplasty. Osseous irregularity involving the medial proximal tibia. Suboptimally evaluated. The knee is likely flexed on the anterior posterior tibia film. IMPRESSION: Osseous irregularity involving the proximal tibia, suspicious for fracture. This is suboptimally evaluated secondary to patient positioning on nondedicated radiograph. Recommend further evaluation with dedicated knee films. Electronically Signed   By: Abigail Miyamoto M.D.   On: 10/09/2017 09:31   Dg Chest Port 1 View  Result Date: 10/09/2017 CLINICAL DATA:  Altered mental status EXAM: PORTABLE CHEST 1 VIEW COMPARISON:  Mar 22, 2015 FINDINGS: There is no edema or consolidation.  Heart size and pulmonary vascularity are normal. There is aortic  atherosclerosis. No adenopathy. There is postoperative change in the lower cervical spine. Bones are osteoporotic. IMPRESSION: No edema or consolidation.  There is aortic atherosclerosis. Aortic Atherosclerosis (ICD10-I70.0). Electronically Signed   By: Lowella Grip III M.D.   On: 10/09/2017 09:24    EKG: Independently reviewed. Sinus rhythm with a first-degree AV block, rate of 89 bpm, left bundle-branch block, poor R-wave progression.   Assessment/Plan Active Problems:   Dehydration  83 year old female who presented from the skilled nursing facility due to right leg pain, and chest congestion with a working diagnosis of aspiration pneumonia and right tibial fracture. She has severe dementia, bedbound, nonverbal with an overall very poor prognosis. Her vital signs include blood pressure 117/104, 106/75, heart rate 83, respiratory rate 23-26, oxygen saturation 93%. Dry mucous membranes, lungs with decrease breath sounds, heart S1-S2 present rhythmic, abdomen soft nontender, lower extremities with no significant deformity or edema. Sodium 144, potassium 3.6, chloride 113, bicarbonate 23, glucose 175, BUN 42, creatinine 1.17 white count 15.4, hemoglobin 7.3, hematocrit 21.5, platelets 168, troponin 0.10, pro calcitonin less than 0.10, chest x-ray with hypo-inflation, left rotation, no infiltrates, effusions or pneumothorax, personally reviewed. Right leg with osseous irregularity involving the proximal tibia and suspicion for fracture, right knee x-rays with acute fracture of the tibial metaphysis anteriorly, medially and posteriorly laterally. The fracture line is at and just inferior to the prosthesis.  Working diagnosis: Right tibial fracture complicated with aspiration pneumonitis, dehydration, anemia, leukocytosis.  1. Right tibial fracture. Patient will be admitted to the medical ward, will use IV morphine as needed for pain, will adjust further analgesia depending on direct observation.  Orthopedic has been consulted over the phone, nonoperative therapy has been recommended, orthopedic device will be placed in the emergency department.  2. Aspiration pneumonitis. Chest x-ray today is hypoinflated, no frank infiltrate, will continue oximetry monitoring, aspiration precautions, hold on antibiotics for now. Patient's family have declined feeding tube in the past. Patient does have a poor prognosis and that has been explained to the patient's family at the bedside. Elevated white blood cells since to be reactive. Will change diet to dysphagia 1, pured with aspiration precautions, head elevated at 45 at all times.   3. Dehydration. Patient will be placed on IV fluids with dextrose and isotonic saline at 75 mL per hour, will follow-up on kidney function and electrolytes in the morning.  4. Anemia. Likely multifactorial, will check iron panel, hold on PRBC transfusion unless hemoglobin less than 7, patient's family is agreeable with PRBC transfusion if needed.   5. Dementia. No current agitation, continue pain control, supportive IV fluids, continue Celexa. Will consult nutrition for assessment.  DVT prophylaxis: enoxaparin Code Status: dnr  Family Communication: I spoke with patient's family at the bedside and all questions have been addressed  Disposition Plan: smf  Consults called: Orthopedics/ over the phone in the ED.  Admission status: Inpatient    Kolsen Choe Gerome Apley MD [facility] Pager [phone]  If 7PM-7AM, please contact night-coverage [email or web address] Password [email or web address]  10/09/2017, 12:50 PM

## 2017-10-10 ENCOUNTER — Other Ambulatory Visit: Payer: Self-pay

## 2017-10-10 DIAGNOSIS — G250 Essential tremor: Secondary | ICD-10-CM

## 2017-10-10 DIAGNOSIS — R296 Repeated falls: Secondary | ICD-10-CM

## 2017-10-10 DIAGNOSIS — J690 Pneumonitis due to inhalation of food and vomit: Secondary | ICD-10-CM | POA: Diagnosis present

## 2017-10-10 DIAGNOSIS — Z7189 Other specified counseling: Secondary | ICD-10-CM

## 2017-10-10 DIAGNOSIS — S82201A Unspecified fracture of shaft of right tibia, initial encounter for closed fracture: Secondary | ICD-10-CM | POA: Diagnosis present

## 2017-10-10 DIAGNOSIS — D509 Iron deficiency anemia, unspecified: Secondary | ICD-10-CM

## 2017-10-10 DIAGNOSIS — K449 Diaphragmatic hernia without obstruction or gangrene: Secondary | ICD-10-CM

## 2017-10-10 DIAGNOSIS — N183 Chronic kidney disease, stage 3 (moderate): Secondary | ICD-10-CM

## 2017-10-10 DIAGNOSIS — D649 Anemia, unspecified: Secondary | ICD-10-CM | POA: Diagnosis present

## 2017-10-10 DIAGNOSIS — F0390 Unspecified dementia without behavioral disturbance: Secondary | ICD-10-CM

## 2017-10-10 DIAGNOSIS — I10 Essential (primary) hypertension: Secondary | ICD-10-CM

## 2017-10-10 DIAGNOSIS — K219 Gastro-esophageal reflux disease without esophagitis: Secondary | ICD-10-CM

## 2017-10-10 DIAGNOSIS — S82201D Unspecified fracture of shaft of right tibia, subsequent encounter for closed fracture with routine healing: Secondary | ICD-10-CM

## 2017-10-10 DIAGNOSIS — Z515 Encounter for palliative care: Secondary | ICD-10-CM

## 2017-10-10 LAB — BLOOD CULTURE ID PANEL (REFLEXED)

## 2017-10-10 LAB — COMPREHENSIVE METABOLIC PANEL
ALBUMIN: 2.9 g/dL — AB (ref 3.5–5.0)
ALK PHOS: 40 U/L (ref 38–126)
ALT: 15 U/L (ref 14–54)
ANION GAP: 6 (ref 5–15)
AST: 21 U/L (ref 15–41)
BILIRUBIN TOTAL: 0.6 mg/dL (ref 0.3–1.2)
BUN: 31 mg/dL — AB (ref 6–20)
CALCIUM: 8.2 mg/dL — AB (ref 8.9–10.3)
CO2: 23 mmol/L (ref 22–32)
Chloride: 117 mmol/L — ABNORMAL HIGH (ref 101–111)
Creatinine, Ser: 0.88 mg/dL (ref 0.44–1.00)
GFR calc Af Amer: >60 mL/min (ref >60)
GFR calc non Af Amer: 56 mL/min — ABNORMAL LOW (ref >60)
GLUCOSE: 163 mg/dL — AB (ref 65–99)
Potassium: 3.5 mmol/L (ref 3.5–5.1)
SODIUM: 146 mmol/L — AB (ref 135–145)
TOTAL PROTEIN: 6 g/dL — AB (ref 6.5–8.1)

## 2017-10-10 LAB — CBC
HCT: 20.6 % — ABNORMAL LOW (ref 36.0–46.0)
Hemoglobin: 6.6 g/dL — CL (ref 12.0–15.0)
MCH: 33.5 pg (ref 26.0–34.0)
MCHC: 32 g/dL (ref 30.0–36.0)
MCV: 104.6 fL — ABNORMAL HIGH (ref 78.0–100.0)
Platelets: 156 K/uL (ref 150–400)
RBC: 1.97 MIL/uL — ABNORMAL LOW (ref 3.87–5.11)
RDW: 14.6 % (ref 11.5–15.5)
WBC: 8.3 K/uL (ref 4.0–10.5)

## 2017-10-10 LAB — CBC WITH DIFFERENTIAL/PLATELET
Basophils Absolute: 0.1 10*3/uL (ref 0.0–0.1)
Basophils Relative: 1 %
Eosinophils Absolute: 0.2 10*3/uL (ref 0.0–0.7)
Eosinophils Relative: 2 %
HEMATOCRIT: 29.9 % — AB (ref 36.0–46.0)
HEMOGLOBIN: 9.9 g/dL — AB (ref 12.0–15.0)
LYMPHS ABS: 1.5 10*3/uL (ref 0.7–4.0)
LYMPHS PCT: 19 %
MCH: 32.9 pg (ref 26.0–34.0)
MCHC: 33.1 g/dL (ref 30.0–36.0)
MCV: 99.3 fL (ref 78.0–100.0)
Monocytes Absolute: 1.3 10*3/uL — ABNORMAL HIGH (ref 0.1–1.0)
Monocytes Relative: 16 %
NEUTROS ABS: 5 10*3/uL (ref 1.7–7.7)
Neutrophils Relative %: 62 %
Platelets: 161 10*3/uL (ref 150–400)
RBC: 3.01 MIL/uL — AB (ref 3.87–5.11)
RDW: 18.2 % — ABNORMAL HIGH (ref 11.5–15.5)
WBC: 8 10*3/uL (ref 4.0–10.5)

## 2017-10-10 LAB — TRANSFERRIN: TRANSFERRIN: 178 mg/dL — AB (ref 192–382)

## 2017-10-10 LAB — URINALYSIS, ROUTINE W REFLEX MICROSCOPIC
Bilirubin Urine: NEGATIVE
GLUCOSE, UA: 50 mg/dL — AB
Hgb urine dipstick: NEGATIVE
Ketones, ur: NEGATIVE mg/dL
Leukocytes, UA: NEGATIVE
Nitrite: NEGATIVE
PROTEIN: 30 mg/dL — AB
Specific Gravity, Urine: 1.025 (ref 1.005–1.030)
pH: 5 (ref 5.0–8.0)

## 2017-10-10 LAB — FERRITIN: Ferritin: 185 ng/mL (ref 11–307)

## 2017-10-10 LAB — IRON AND TIBC
IRON: 28 ug/dL (ref 28–170)
Saturation Ratios: 11 % (ref 10.4–31.8)
TIBC: 249 ug/dL — ABNORMAL LOW (ref 250–450)
UIBC: 221 ug/dL

## 2017-10-10 LAB — PREPARE RBC (CROSSMATCH)

## 2017-10-10 LAB — ABO/RH: ABO/RH(D): A NEG

## 2017-10-10 LAB — TROPONIN I
Troponin I: 0.09 ng/mL
Troponin I: 0.08 ng/mL (ref <0.03)
Troponin I: 0.07 ng/mL (ref <0.03)

## 2017-10-10 MED ORDER — SODIUM CHLORIDE 0.9 % IV SOLN
Freq: Once | INTRAVENOUS | Status: AC
  Administered 2017-10-10: 08:00:00 via INTRAVENOUS

## 2017-10-10 MED ORDER — BOOST / RESOURCE BREEZE PO LIQD
1.0000 | Freq: Two times a day (BID) | ORAL | Status: DC
  Administered 2017-10-10 – 2017-10-11 (×3): 1 via ORAL

## 2017-10-10 MED ORDER — ADULT MULTIVITAMIN LIQUID CH
15.0000 mL | Freq: Every day | ORAL | Status: DC
  Administered 2017-10-11: 15 mL via ORAL
  Filled 2017-10-10 (×2): qty 15

## 2017-10-10 MED ORDER — DIPHENHYDRAMINE HCL 25 MG PO CAPS: 25.0000 mg | ORAL_CAPSULE | Freq: Once | ORAL | Status: DC

## 2017-10-10 MED ORDER — DEXTROSE-NACL 5-0.45 % IV SOLN
INTRAVENOUS | Status: DC
  Administered 2017-10-10: 08:00:00 via INTRAVENOUS

## 2017-10-10 MED ORDER — PANTOPRAZOLE SODIUM 40 MG PO PACK
40.0000 mg | PACK | Freq: Two times a day (BID) | ORAL | Status: DC
  Administered 2017-10-10 (×2): 40 mg via ORAL
  Filled 2017-10-10 (×3): qty 20

## 2017-10-10 MED ORDER — DEXTROSE-NACL 5-0.45 % IV SOLN: INTRAVENOUS | Status: DC

## 2017-10-10 MED ORDER — ACETAMINOPHEN 325 MG PO TABS: 650.0000 mg | ORAL_TABLET | Freq: Once | ORAL | Status: DC

## 2017-10-10 MED ORDER — FUROSEMIDE 10 MG/ML IJ SOLN
20.0000 mg | Freq: Once | INTRAMUSCULAR | Status: AC
  Administered 2017-10-10: 20 mg via INTRAVENOUS
  Filled 2017-10-10: qty 2

## 2017-10-10 MED ORDER — DIPHENHYDRAMINE HCL 12.5 MG/5ML PO ELIX
25.0000 mg | ORAL_SOLUTION | Freq: Once | ORAL | Status: AC
  Administered 2017-10-10: 25 mg via ORAL
  Filled 2017-10-10: qty 10

## 2017-10-10 MED ORDER — ACETAMINOPHEN 160 MG/5ML PO SOLN
650.0000 mg | Freq: Once | ORAL | Status: AC
  Administered 2017-10-10: 650 mg via ORAL
  Filled 2017-10-10: qty 20.3

## 2017-10-10 NOTE — Progress Notes (Signed)
PROGRESS NOTE    [name] [name]  MWN:[id number] DOB: [birth year] DOA: 10/09/2017 PCP: Elson Clan, MD    Brief Narrative:  83-year-old female history of dementia, bedbound, nonverbal, poor prognosis, resident of skilled nursing facility presented to the ED with right leg pain and some chest congestion.  Concern for aspiration pneumonia.  Patient noted to have a right tibial fracture which is deemed nonoperative.  Patient also noted to have a significant anemia with hemoglobin of 6.6 and being transfused 2 units packed red blood cells.   Assessment & Plan:   Principal Problem:   Right tibial fracture Active Problems:   PARKINSON'S DISEASE   Essential hypertension   Type II diabetes mellitus (HCC)   Gastroesophageal reflux disease with hiatal hernia   Frequent falls   Senile dementia   Benign essential tremor   CKD (chronic kidney disease) stage 3, GFR 30-59 ml/min (HCC)   Anemia, iron deficiency   Dehydration   Anemia   Aspiration pneumonitis (HCC)  #1 closed right tibial fracture Patient noted to have a right tibial fracture noted per the admitting physician that orthopedics was consulted over the phone who had recommended nonoperative therapy.  Will place on scheduled Tylenol.  Pain management.  Follow.  2.??  Aspiration pneumonitis Chest x-ray done on admission negative for any acute infiltrate.  Patient with history of dementia and some dysphagia.  Family have declined feeding tube before in the past.  Patient with poor prognosis.  Leukocytosis noted on admission has resolved and likely was reactive.  Continue current diet with dysphagia 1 diet with aspiration precautions.  Consult with speech.  3.  Dehydration Hydrate with IV fluids.  4.  Anemia Questionable etiology.  Patient with no overt bleeding.  Hemoglobin currently at 6.6 this morning.  Will transfuse 2 units packed red blood cells.  Follow H&H.  Anemia panel pending.  Due to patient's multiple comorbidities, poor  prognosis, dementia and age we will treat supportively and transfuse as needed.  Discussed with patient's daughter who is in agreement.  5.  Dementia Stable.  Continue Celexa.   DVT prophylaxis: SCDs Code Status: DNR Family Communication: Updated daughter at bedside. Disposition Plan: Likely back to facility once pain is managed, hemoglobin stabilized, clinically improved.   Consultants:   None  Procedures:   Plain films of the right tib-fib 10/09/2017  Chest x-ray 10/09/2017  Plain films of the right knee 10/09/2017  2 units packed red blood cells 10/10/2017  Antimicrobials:   None   Subjective: Patient sleeping.  Nonverbal.  Family at bedside.  Objective: Vitals:   10/10/17 1227 10/10/17 1245 10/10/17 1309 10/10/17 1332  BP:  (!) 147/48 129/78 132/64  Pulse:  68 94 87  Resp:  20 20 20   Temp:  99.5 F (37.5 C) 99.9 F (37.7 C) 99.7 F (37.6 C)  TempSrc:  Axillary Axillary Axillary  SpO2:  99% 99% 98%  Weight: 62.9 kg (138 lb 9.6 oz)     Height: 5' 0.5" (1.537 m)       Intake/Output Summary (Last 24 hours) at 10/10/2017 1545 Last data filed at 10/10/2017 1500 Gross per 24 hour  Intake 1595.25 ml  Output -  Net 1595.25 ml   Filed Weights   10/09/17 0829 10/10/17 1227  Weight: 61.4 kg (135 lb 7 oz) 62.9 kg (138 lb 9.6 oz)    Examination:  General exam: Sleeping. Respiratory system: Clear to auscultation anterior lung fields.Marland Kitchen Respiratory effort normal. Cardiovascular system: S1 & S2 heard, RRR. No  JVD, murmurs, rubs, gallops or clicks. No pedal edema. Gastrointestinal system: Abdomen is nondistended, soft and nontender. No organomegaly or masses felt. Normal bowel sounds heard. Central nervous system: Sleeping. No focal neurological deficits. Extremities: Right lower extremity with knee immobilizer.  Skin: No rashes, lesions or ulcers Psychiatry: Judgement and insight appear poor. Mood & affect appropriate.     Data Reviewed: I have personally  reviewed following labs and imaging studies  CBC: Recent Labs  Lab 10/09/17 0856 10/10/17 0623  WBC 15.4* 8.3  NEUTROABS 12.2*  --   HGB 7.3* 6.6*  HCT 21.5* 20.6*  MCV 104.4* 104.6*  PLT 168 176   Basic Metabolic Panel: Recent Labs  Lab 10/09/17 0856 10/10/17 0623  NA 144 146*  K 3.6 3.5  CL 113* 117*  CO2 23 23  GLUCOSE 175* 163*  BUN 42* 31*  CREATININE 1.17* 0.88  CALCIUM 9.0 8.2*   GFR: Estimated Creatinine Clearance: 35.7 mL/min (by C-G formula based on SCr of 0.88 mg/dL). Liver Function Tests: Recent Labs  Lab 10/09/17 0856 10/10/17 0623  AST 26 21  ALT 17 15  ALKPHOS 49 40  BILITOT 0.8 0.6  PROT 6.6 6.0*  ALBUMIN 3.7 2.9*   No results for input(s): LIPASE, AMYLASE in the last 168 hours. No results for input(s): AMMONIA in the last 168 hours. Coagulation Profile: Recent Labs  Lab 10/09/17 0856  INR 1.10   Cardiac Enzymes: Recent Labs  Lab 10/09/17 0856 10/10/17 0854  TROPONINI 0.10* 0.09*   BNP (last 3 results) No results for input(s): PROBNP in the last 8760 hours. HbA1C: No results for input(s): HGBA1C in the last 72 hours. CBG: No results for input(s): GLUCAP in the last 168 hours. Lipid Profile: No results for input(s): CHOL, HDL, LDLCALC, TRIG, CHOLHDL, LDLDIRECT in the last 72 hours. Thyroid Function Tests: No results for input(s): TSH, T4TOTAL, FREET4, T3FREE, THYROIDAB in the last 72 hours. Anemia Panel: Recent Labs    10/10/17 0623  FERRITIN 185  TIBC 249*  IRON 28   Sepsis Labs: Recent Labs  Lab 10/09/17 0945  LATICACIDVEN 1.22    Recent Results (from the past 240 hour(s))  Culture, blood (routine x 2)     Status: None (Preliminary result)   Collection Time: 10/09/17  8:56 AM  Result Value Ref Range Status   Specimen Description BLOOD RIGHT FOREARM  Final   Special Requests   Final    IN PEDIATRIC BOTTLE Blood Culture results may not be optimal due to an excessive volume of blood received in culture bottles    Culture   Final    NO GROWTH < 24 HOURS Performed at [facility], [place] [address] [place], [place] [address]    Report Status PENDING  Incomplete  Culture, blood (routine x 2)     Status: None (Preliminary result)   Collection Time: 10/09/17  9:01 AM  Result Value Ref Range Status   Specimen Description RIGHT ANTECUBITAL  Final   Special Requests IN PEDIATRIC BOTTLE Blood Culture adequate volume  Final   Culture  Setup Time   Final    GRAM POSITIVE COCCI IN CLUSTERS IN PEDIATRIC BOTTLE CRITICAL RESULT CALLED TO, READ BACK BY AND VERIFIED WITH: DWOFFORD @1012  10/10/17 BY LHOWARD    Culture   Final    NO GROWTH < 24 HOURS Performed at [facility], [place] [address] [place],  [address]    Report Status PENDING  Incomplete  Blood Culture ID Panel (Reflexed)  Status: Abnormal   Collection Time: 10/09/17  9:01 AM  Result Value Ref Range Status   Enterococcus species NOT DETECTED NOT DETECTED Final   Listeria monocytogenes NOT DETECTED NOT DETECTED Final   Staphylococcus species DETECTED (A) NOT DETECTED Final    Comment: Methicillin (oxacillin) resistant coagulase negative staphylococcus. Possible blood culture contaminant (unless isolated from more than one blood culture draw or clinical case suggests pathogenicity). No antibiotic treatment is indicated for blood  culture contaminants. CRITICAL RESULT CALLED TO, READ BACK BY AND VERIFIED WITH: DWOFFOLD,PHARMD @1012  10/10/17 BY LHOWARD    Staphylococcus aureus NOT DETECTED NOT DETECTED Final   Methicillin resistance DETECTED (A) NOT DETECTED Final    Comment: CRITICAL RESULT CALLED TO, READ BACK BY AND VERIFIED WITH: DWOFFORD,PHARMD @1012  10/10/17 BY LHOWARD    Streptococcus species NOT DETECTED NOT DETECTED Final   Streptococcus agalactiae NOT DETECTED NOT DETECTED Final   Streptococcus pneumoniae NOT DETECTED NOT DETECTED Final   Streptococcus pyogenes NOT DETECTED NOT DETECTED Final   Acinetobacter baumannii  NOT DETECTED NOT DETECTED Final   Enterobacteriaceae species NOT DETECTED NOT DETECTED Final   Enterobacter cloacae complex NOT DETECTED NOT DETECTED Final   Escherichia coli NOT DETECTED NOT DETECTED Final   Klebsiella oxytoca NOT DETECTED NOT DETECTED Final   Klebsiella pneumoniae NOT DETECTED NOT DETECTED Final   Proteus species NOT DETECTED NOT DETECTED Final   Serratia marcescens NOT DETECTED NOT DETECTED Final   Haemophilus influenzae NOT DETECTED NOT DETECTED Final   Neisseria meningitidis NOT DETECTED NOT DETECTED Final   Pseudomonas aeruginosa NOT DETECTED NOT DETECTED Final   Candida albicans NOT DETECTED NOT DETECTED Final   Candida glabrata NOT DETECTED NOT DETECTED Final   Candida krusei NOT DETECTED NOT DETECTED Final   Candida parapsilosis NOT DETECTED NOT DETECTED Final   Candida tropicalis NOT DETECTED NOT DETECTED Final    Comment: Performed at [facility], [place]. [address], [place], [place] [address]         Radiology Studies: Dg Knee 2 Views Right  Result Date: 10/09/2017 CLINICAL DATA:  Increased right knee pain. Abnormal appearance of the knee on a right tibia and fibula series of today's study. EXAM: RIGHT KNEE - 1-2 VIEW COMPARISON:  Right tibia and fibula radiograph of today's date and perioperative right knee series of February 15, 2007 FINDINGS: There is an acute fracture involving the anterior and medial aspects of the tibial metaphysis. Some involvement laterally is suspected but not well demonstrated. There is avulsion of the tibial tuberosity. The proximal fibula is grossly intact. The interface of the tibial portion of the knee prosthesis with the surrounding native bone appears normal. The distal femur and the femoral portion of the prosthesis also appear normal. IMPRESSION: There is an acute fracture of the tibial metaphysis anteriorly, medially, and possibly laterally. The fracture line is at and just inferior to the prosthesis and its interface with  the native bone. Electronically Signed   By: David  Martinique M.D.   On: 10/09/2017 11:01   Dg Tibia/fibula Right  Result Date: 10/09/2017 CLINICAL DATA:  Right lower leg pain and swelling. No trauma history known. EXAM: RIGHT TIBIA AND FIBULA - 2 VIEW COMPARISON:  Ankle films 06/22/2011.  Knee films 02/15/2015. FINDINGS: Vascular calcifications. Diffuse soft tissue swelling. Total knee arthroplasty. Osseous irregularity involving the medial proximal tibia. Suboptimally evaluated. The knee is likely flexed on the anterior posterior tibia film. IMPRESSION: Osseous irregularity involving the proximal tibia, suspicious for fracture. This is suboptimally evaluated secondary  to patient positioning on nondedicated radiograph. Recommend further evaluation with dedicated knee films. Electronically Signed   By: Abigail Miyamoto M.D.   On: 10/09/2017 09:31   Dg Chest Port 1 View  Result Date: 10/09/2017 CLINICAL DATA:  Altered mental status EXAM: PORTABLE CHEST 1 VIEW COMPARISON:  Mar 22, 2015 FINDINGS: There is no edema or consolidation. Heart size and pulmonary vascularity are normal. There is aortic atherosclerosis. No adenopathy. There is postoperative change in the lower cervical spine. Bones are osteoporotic. IMPRESSION: No edema or consolidation.  There is aortic atherosclerosis. Aortic Atherosclerosis (ICD10-I70.0). Electronically Signed   By: Lowella Grip III M.D.   On: 10/09/2017 09:24        Scheduled Meds: . acetaminophen  650 mg Oral TID  . citalopram  10 mg Oral Daily  . feeding supplement  1 Container Oral BID BM  . multivitamin  15 mL Oral Daily  . pantoprazole sodium  40 mg Oral BID  . ranitidine  150 mg Oral QHS  . sodium chloride flush  3 mL Intravenous Q12H   Continuous Infusions: . dextrose 5 % and 0.45% NaCl 75 mL/hr at 10/10/17 0819     LOS: 1 day    Time spent: 35 minutes    Irine Seal, MD [facility] Pager [phone]  If 7PM-7AM, please contact  night-coverage [email or web address] Password [facility] 10/10/2017, 3:45 PM

## 2017-10-10 NOTE — Progress Notes (Signed)
Attempted to insert an In and Out Cath per MD orders. Attempted 3 times per myself and Nursing Students. Called to fourth floor to see if they can come try.

## 2017-10-10 NOTE — Evaluation (Signed)
SLP Cancellation Note  Patient Details Name: JANNETT SCHMALL MRN: [id number] DOB: 03-Jul-1924   Cancelled treatment:       Reason Eval/Treat Not Completed: Other (comment)(pt with palliative team at this time, will continue efforts, RN reports tolerance of liquid diet/intake)   Luanna Salk, [place] SLP [phone]

## 2017-10-10 NOTE — Clinical Social Work Note (Signed)
Clinical Social Work Assessment  Patient Details  Name: [name] [name] MRN: [id number] Date of Birth: Apr 05, 1924  Date of referral:  10/10/17               Reason for consult:  Facility Placement(From [name])                Permission sought to share information with:  Case Freight forwarder, Customer service manager, Family Supports Permission granted to share information::     Name::        Agency::     Relationship::  [facility]  Contact Information:     Housing/Transportation Living arrangements for the past 2 months:  [name] [name] of Information:  Adult Children, Medical Team Patient Interpreter Needed:  None(Patient no verbal) Criminal Activity/Legal Involvement Pertinent to Current Situation/Hospitalization:  No - Comment as needed Significant Relationships:  Adult Children, Warehouse manager Lives with:  Facility Resident Do you feel safe going back to the place where you live?  Yes Need for family participation in patient care:  Yes (Comment)  Care giving concerns:  No care giving concerns at the time of assessment.   Social Worker assessment / plan:  LCSW following for return to facility.  Patient is a 83 yr old female [facility] the [name] from [name].   Patient was admitted to the [name] for right leg pain and difficulty breathing. Patient has a history of advanced dementia, she is nonverbal and nonambulatory.  LCSW obtained information from adult daughter by phone. No family at bedside when LCSW visited.   According to patient's daughter patient is total care at [name]. Daughter reports they are satisfied with care at facility.  LCSW confirmed patients return with facility.  PLAN: Patient to return to SNF at DC.      Employment status:  Retired Forensic scientist:  Medicare PT Recommendations:  Not assessed at this time Information / Referral to community resources:     Patient/Family's  Response to care:  Daughter appeared to be please with care. She did express that she was surprised that patient would not be discharged today and will stay another night.   Patient/Family's Understanding of and Emotional Response to Diagnosis, Current Treatment, and Prognosis:  Family is understanding of patients diagnosis and in agreement to current treatment plan.   Emotional Assessment Appearance:  Appears stated age Attitude/Demeanor/Rapport:  Unable to Assess Affect (typically observed):  Unable to Assess Orientation:  Oriented to Self Alcohol / Substance use:  Not Applicable Psych involvement (Current and /or in the community):  No (Comment)  Discharge Needs  Concerns to be addressed:  No discharge needs identified Readmission within the last 30 days:  No Current discharge risk:  None Barriers to Discharge:  Continued Medical Work up   [name], LCSW 10/10/2017, 10:54 AM

## 2017-10-10 NOTE — Progress Notes (Signed)
CRITICAL VALUE ALERT  Critical Value:  Hgb 6.6  Date & Time Notied:  0722 10/10/2017  Provider Notified: Grandville Silos MD  [phone] via page  Orders Received/Actions taken: Awaiting Orders

## 2017-10-10 NOTE — Progress Notes (Signed)
Initial Nutrition Assessment  DOCUMENTATION CODES:   Not applicable  INTERVENTION:    Boost Breeze po BID, each supplement provides 250 kcal and 9 grams of protein  Mighty Shake II Q24 with meals, each supplement provides 480-500 kcals and 20-23 grams of protein  Provide liquid MVI daily  NUTRITION DIAGNOSIS:   Swallowing difficulty related to dysphagia as evidenced by per patient/family report.  GOAL:   Patient will meet greater than or equal to 90% of their needs  MONITOR:   PO intake, Supplement acceptance, Weight trends, Labs  REASON FOR ASSESSMENT:   Malnutrition Screening Tool   ASSESSMENT:   Pt with PMH significant for advanced dementia (nonverbal/nonambulatory), DM, HLD, HTN, osteoporosis, Parkinson's disease, and swallowing difficulty requiring a full liquid diet. Family aware of risk for aspiration pneumonia but has decided not to pursue any aggressive therapy like tube feedings. Presents this admission with right tibial fracture, aspiration pneumonia, and dehydration.   Spoke with daughter at bedside. Pt has been on a full liquid diet for 6 months due to swallowing difficulties. Denies any loss in appetite. Typically pt eats three meals per day that are liquifed. Meals consist of grits, yogurt, green beans, cabbage, and soups. Pt is sent a mighty shake and two boost breeze per day. Daughter states pt cannot tolerate foods with textures and thinner liquids usually take a little longer for pt to consume. Family goes to SNF once per day to help with feedings. Family requests Boost Breeze BID and Mighty shake Q24.   Daughter reports pt's usual body weight stays around 170 lb, unsure of when pt was at  this weight. Weight records are limited making it hard to quantify weight loss.    Nutrition-Focused physical exam completed. Suspect noted depletions are from advanced age muscle atrophy.   Medications reviewed and include: lasix, NaCl with D5 @ 75 ml/hr Labs reviewed:  Na 146 (H)   NUTRITION - FOCUSED PHYSICAL EXAM:    Most Recent Value  Orbital Region  Moderate depletion  Upper Arm Region  No depletion  Thoracic and Lumbar Region  Unable to assess  Buccal Region  Moderate depletion  Temple Region  Moderate depletion  Clavicle Bone Region  Mild depletion  Clavicle and Acromion Bone Region  Mild depletion  Scapular Bone Region  Unable to assess  Dorsal Hand  No depletion  Patellar Region  No depletion  Anterior Thigh Region  No depletion  Posterior Calf Region  No depletion  Edema (RD Assessment)  None  Hair  Reviewed  Eyes  Reviewed  Mouth  Unable to assess  Skin  Reviewed  Nails  Reviewed       Diet Order:  Diet full liquid Room service appropriate? Yes; Fluid consistency: Thin  EDUCATION NEEDS:   Education needs have been addressed  Skin:  Skin Assessment: Reviewed RN Assessment  Last BM:  PTA  Height:   Ht Readings from Last 1 Encounters:  10/10/17 5' 0.5" (1.537 m)    Weight:   Wt Readings from Last 1 Encounters:  10/10/17 138 lb 9.6 oz (62.9 kg)    Ideal Body Weight:  45.5 kg  BMI:  Body mass index is 26.62 kg/m.  Estimated Nutritional Needs:   Kcal:  1500-1700 kcal/day  Protein:  70-80 g/day  Fluid:  >1.5 L/day    Mariana Single RD, LDN Clinical Nutrition Pager # - [phone]

## 2017-10-10 NOTE — NC FL2 (Signed)
[place] LEVEL OF CARE SCREENING TOOL     IDENTIFICATION  Patient Name: [name] [name] Birthdate: [birth year] Sex: female Admission Date (Current Location): 10/09/2017  [facility] and [place] Number:  Herbalist and Address:  [facility],  [place], [place]      Provider Number: [id number]  Attending Physician Name and Address:  Eugenie Filler, MD  Relative Name and Phone Number:       Current Level of Care: SNF Recommended Level of Care: [place] Prior Approval Number:    Date Approved/Denied:   PASRR Number: [id number]  Discharge Plan: SNF    Current Diagnoses: Patient Active Problem List   Diagnosis Date Noted  . Dehydration 10/09/2017  . Sepsis ([place]) 03/28/2015  . Pubic ramus fracture ([place]) 03/28/2015  . Anemia, iron deficiency 03/26/2015  . Aspiration pneumonia ([place]) 03/26/2015  . Closed head injury   . Fever presenting with conditions classified elsewhere 03/24/2015  . Acute renal failure superimposed on stage 3 chronic kidney disease ([place]) 03/24/2015  . Scalp laceration   . Faintness   . Fall 03/22/2015  . Syncope and collapse 03/22/2015  . Atypical chest pain 03/22/2015  . Venous stasis dermatitis of both lower extremities 07/18/2014  . Renal stone 03/25/2014  . Wheezing 01/10/2014  . Persistent recurrent vomiting 10/30/2013  . Leukocytosis 10/25/2013  . CKD (chronic kidney disease) stage 3, GFR 30-59 ml/min (HCC) 10/25/2013  . Xerosis of skin 09/06/2013  . Benign essential tremor 09/06/2013  . Senile dementia 06/24/2011  . Intracranial bleed ([place]) 06/23/2011  . Frequent falls 06/23/2011  . ASCVD (arteriosclerotic cardiovascular disease)   . Type II diabetes mellitus ([place])   . Degenerative joint disease   . Retinal artery branch occlusion of right eye   . Gastroesophageal reflux disease with hiatal hernia   . HYPERLIPIDEMIA 01/16/2008  . PARKINSON'S DISEASE 01/16/2008  .  Essential hypertension 01/16/2008  . NEPHROLITHIASIS 01/16/2008    Orientation RESPIRATION BLADDER Height & Weight     Self  Normal Incontinent Weight: 135 lb 7 oz (61.4 kg) Height:  5' 0.5" (153.7 cm)  BEHAVIORAL SYMPTOMS/MOOD NEUROLOGICAL BOWEL NUTRITION STATUS      Incontinent Diet(See DC summary)  AMBULATORY STATUS COMMUNICATION OF NEEDS Skin   Total Care Non-Verbally Normal                       Personal Care Assistance Level of Assistance  Bathing, Feeding, Dressing Bathing Assistance: Maximum assistance Feeding assistance: Limited assistance Dressing Assistance: Maximum assistance Total Care Assistance: Maximum assistance   Functional Limitations Info  Sight, Hearing, Speech Sight Info: Adequate Hearing Info: Adequate Speech Info: Impaired    SPECIAL CARE FACTORS FREQUENCY                       Contractures      Additional Factors Info  Code Status, Allergies Code Status Info: DNR Allergies Info: Band-aid Liquid Bandage Dermagran, Codeine, Dilaudid Hydromorphone Hcl, Morphine, Other           Current Medications (10/10/2017):  This is the current hospital active medication list Current Facility-Administered Medications  Medication Dose Route Frequency Provider Last Rate Last Dose  . acetaminophen (TYLENOL) solution 650 mg  650 mg Oral TID Tawni Millers, MD   650 mg at 10/10/17 [phone]  . citalopram (CELEXA) tablet 10 mg  10 mg Oral Daily Arrien, Jimmy Picket, MD   10 mg at  10/10/17 7544  . dextrose 5 %-0.45 % sodium chloride infusion   Intravenous Continuous Polly Cobia, RPH 75 mL/hr at 10/10/17 9201    . furosemide (LASIX) injection 20 mg  20 mg Intravenous Once Eugenie Filler, MD      . morphine 4 MG/ML injection 1 mg  1 mg Intravenous Q2H PRN Arrien, Jimmy Picket, MD      . ondansetron [facility]) tablet 4 mg  4 mg Oral Q6H PRN Arrien, Jimmy Picket, MD       Or  . ondansetron [facility]) injection 4 mg  4 mg Intravenous Q6H  PRN Arrien, Jimmy Picket, MD      . pantoprazole sodium (PROTONIX) 40 mg/20 mL oral suspension 40 mg  40 mg Oral BID Eugenie Filler, MD   40 mg at 10/10/17 1103  . polyethylene glycol (MIRALAX / GLYCOLAX) packet 17 g  17 g Oral Daily PRN Arrien, Jimmy Picket, MD      . ranitidine (ZANTAC) 150 MG/10ML syrup 150 mg  150 mg Oral QHS Arrien, Jimmy Picket, MD   150 mg at 10/09/17 2346  . sodium chloride flush (NS) 0.9 % injection 3 mL  3 mL Intravenous Q12H Arrien, Jimmy Picket, MD         Discharge Medications: Please see discharge summary for a list of discharge medications.  Relevant Imaging Results:  Relevant Lab Results:   Additional Information ssn: [id number]  Servando Snare, LCSW

## 2017-10-10 NOTE — Care Management Note (Signed)
Case Management Note  Patient Details  Name: [name] [name] MRN: [id number] Date of Birth: [birth year]  Subjective/Objective:                   anemia  Action/Plan: Date: October 10, 2017 Velva Harman, BSN, [place], [place] Chart and notes review for patient progress and needs. Will follow for case management and discharge needs. Next review date: [id number]  Expected Discharge Date:  (UNKNOWN)               Expected Discharge Plan:  Home/Self Care  In-House Referral:     Discharge planning Services  CM Consult  Post Acute Care Choice:    Choice offered to:     DME Arranged:    DME Agency:     HH Arranged:    HH Agency:     Status of Service:  In process, will continue to follow  If discussed at Long Length of Stay Meetings, dates discussed:    Additional Comments:  Leeroy Cha, RN 10/10/2017, 8:40 AM

## 2017-10-10 NOTE — Consult Note (Signed)
Consultation Note Date: 10/10/2017   Patient Name: [name] [name]  DOB: January 28, 1924  MRN: [id number]  Age / Sex: 83 y.o., female  PCP: Elson Clan, MD Referring Physician: Eugenie Filler, MD  Reason for Consultation: Establishing goals of care  HPI/Patient Profile: 83 y.o. female  with past medical history of advanced dementia, Parkinson's disease?, CAD, DM, cervical spondylarthritis, left lower lung nodule, stroke, upper GIB, right eye blindness admitted on 10/09/2017 with right tibial fracture, dehydration and aspiration pneumonitis.   Clinical Assessment and Goals of Care: I met at [name] [name]'s bedside with daughter/HCPOA, [name] [name]. [name] [name] shares that her mother has lived in ALF/SNF for the past ~6 years and has had dementia for ~18 years. [name] [name] says that they are aware that she is in the final stages of dementia and have been surprised that she has been able to maintain her weight with such a restricted diet s/t dysphagia for the past ~3 years. [name] [name] confirms that her family has agreed to DNR, would not want feeding tube, and do not want any invasive procedures/surgeries. Their main goal is for comfort and to avoid hospitalization. We discussed close monitoring of intake/dehydration, pain control with fracture, and recurrent infection (aspiration, UTI) and when to involve hospice care to assist. [name] [name] is open to hospice care if needed. Right now [name] [name] appears close to baseline and family is hopeful for return to SNF tomorrow and they say that [name] [name] has received blood transfusion at SNF previously?? Family very reasonable in hopeful for more time (birthday party planned for [name] [name] Dec 8th) but focus is on comfort.   Primary Decision Maker HCPOA daughter [name] [name] and granddaughter Alyse Low    SUMMARY OF RECOMMENDATIONS   - Transfusion as needed (no desire for EGD/colonoscopy) - No  feeding tube - Would benefit from palliative to follow for recommendation for hospice care when time  Code Status/Advance Care Planning:  DNR   Symptom Management:   Continue scheduled Tylenol. May consider low dose OxyIR 5 mg every 6 hours prn severe pain.   Palliative Prophylaxis:   Aspiration, Bowel Regimen, Delirium Protocol, Eye Care, Frequent Pain Assessment, Oral Care and Turn Reposition  Additional Recommendations (Limitations, Scope, Preferences):  Avoid Hospitalization, Minimize Medications, No Artificial Feeding and No Surgical Procedures  Psycho-social/Spiritual:   Desire for further Chaplaincy support:no  Additional Recommendations: Education on Hospice  Prognosis:   Very poor with progressing dementia. Dependant on intake and recurrent infection/aspiration.   Discharge Planning: Drew for rehab with Palliative care service follow-up      Primary Diagnoses: Present on Admission: . Dehydration . Anemia . CKD (chronic kidney disease) stage 3, GFR 30-59 ml/min (HCC) . Benign essential tremor . Anemia, iron deficiency . Essential hypertension . PARKINSON'S DISEASE . Senile dementia   I have reviewed the medical record, interviewed the patient and family, and examined the patient. The following aspects are pertinent.  Past Medical History:  Diagnosis Date  . Alzheimer disease   . Alzheimer's disease   . ASCVD (arteriosclerotic cardiovascular  disease) 2003   Single vessel disease-60% D1  . Cervical spondylarthritis    S/p discectomy  . Cervical spondylarthritis   . Coronary artery disease   . Degenerative joint disease    right TKA in 4/06  . Degenerative joint disease   . Dementia    PT RESIDES AT [facility] - SHE TALKS SOME - DOES NOT AMBULATE- TRANSFERS TO W/C WITH 2 ASSISTANTS  . Diabetes mellitus    No insulin- NO MEDS - LOST WEIGHT - NO LONGER HAS ELEVATED  SUGARS  . Diverticulosis   . Diverticulosis   . Gastroesophageal reflux disease with hiatal hernia   . GI bleed   . Hiatal hernia   . History of nephrolithiasis   . Hyperlipidemia   . Hyperlipidemia   . Hypertension   . Leukocytosis   . Leukopenia    and anemia-hematology evaluation was nodiagnostic  . Lung nodule    Left lower lobe  . Osteoporosis   . Parkinson's disease    PRESUMED BECAUSE SHE HAS A LOT OF SHAKING - BUT HAS SEEN NEUROLOGIST  AND WAS PUT ON MYSOLINE AND FAMILY HAS SEEN BIG IMPROVEMENT IN THE SHAKING  . Retinal artery branch occlusion of right eye    Sudden onset of right eye blindness-embolism suspected  . Senile dementia   . Shortness of breath    AND WHEEZING WITH ACTIVITY AND AT REST  . Stroke ([place])    RIGHT EYE - IS BLIND IN RT EYE  . Upper GI bleed    gastric stress ulcer in 2003   Social History   Socioeconomic History  . Marital status: Widowed    Spouse name: None  . Number of children: None  . Years of education: None  . Highest education level: None  Social Needs  . Financial resource strain: None  . Food insecurity - worry: None  . Food insecurity - inability: None  . Transportation needs - medical: None  . Transportation needs - non-medical: None  Occupational History  . None  Tobacco Use  . Smoking status: Never Smoker  . Smokeless tobacco: Never Used  Substance and Sexual Activity  . Alcohol use: No    Alcohol/week: 0.0 oz  . Drug use: No  . Sexual activity: No    Birth control/protection: Surgical  Other Topics Concern  . None  Social History Narrative   Retired from [employer]    Family History  Problem Relation Age of Onset  . Diabetes Sister   . Heart disease Sister   . Diabetes Brother    Scheduled Meds: . acetaminophen  650 mg Oral TID  . citalopram  10 mg Oral Daily  . feeding supplement  1 Container Oral BID BM  . multivitamin  15 mL Oral Daily  . pantoprazole sodium  40 mg Oral BID  . ranitidine   150 mg Oral QHS  . sodium chloride flush  3 mL Intravenous Q12H   Continuous Infusions: . dextrose 5 % and 0.45% NaCl 75 mL/hr at 10/10/17 0819   PRN Meds:.morphine injection, ondansetron **OR** ondansetron (ZOFRAN) IV, polyethylene glycol Allergies  Allergen Reactions  . Band-Aid Liquid Bandage [Dermagran]     Use non-stick gauze with paper tape  . Codeine Other (See Comments)    REACTION: Unknown  . Dilaudid [Hydromorphone Hcl]     Caused agitation, hallucinations, could not sleep, pt made funny noises like laughing / crying.  . Morphine Nausea And  Vomiting  . Other     NO CITRUS, TOMATO BASED PRODUCTS OR CHOCOLATE - BECAUSE OF REFLUX PT'S FAMILY STATES THAT ANY TIME PT IS GIVEN NARCOTICS SHE HALLUCINATES AND TRIES TO GET UP OUT W/C OR BED Medications have to be crushed and put in apple sauce.   Review of Systems  Unable to perform ROS: Dementia    Physical Exam  Constitutional: She appears cachectic. She appears ill.  Cardiovascular: Normal rate and regular rhythm.  Pulmonary/Chest: Effort normal. No accessory muscle usage. No tachypnea. No respiratory distress.  Abdominal: Normal appearance.  Neurological: She is disoriented.  Nursing note and vitals reviewed.   Vital Signs: BP 132/64   Pulse 87   Temp 99.7 F (37.6 C) (Axillary)   Resp 20   Ht 5' 0.5" (1.537 m)   Wt 62.9 kg (138 lb 9.6 oz)   SpO2 98%   BMI 26.62 kg/m  Pain Assessment: No/denies pain       SpO2: SpO2: 98 % O2 Device:SpO2: 98 % O2 Flow Rate: .   IO: Intake/output summary:   Intake/Output Summary (Last 24 hours) at 10/10/2017 1543 Last data filed at 10/10/2017 1500 Gross per 24 hour  Intake 1595.25 ml  Output -  Net 1595.25 ml    LBM:   Baseline Weight: Weight: 61.4 kg (135 lb 7 oz) Most recent weight: Weight: 62.9 kg (138 lb 9.6 oz)     Palliative Assessment/Data: 20%    Time Total: 60 min  Greater than 50%  of this time was spent counseling and coordinating care related to  the above assessment and plan.  Signed by: Vinie Sill, NP Palliative Medicine Team Pager # [phone] (M-F 8a-5p) Team Phone # [phone] (Nights/Weekends)

## 2017-10-10 NOTE — Progress Notes (Signed)
PHARMACY - PHYSICIAN COMMUNICATION CRITICAL VALUE ALERT - BLOOD CULTURE IDENTIFICATION (BCID)  Results for orders placed or performed during the hospital encounter of 10/09/17  Blood Culture ID Panel (Reflexed) (Collected: 10/09/2017  9:01 AM)  Result Value Ref Range   Enterococcus species NOT DETECTED NOT DETECTED   Listeria monocytogenes NOT DETECTED NOT DETECTED   Staphylococcus species DETECTED (A) NOT DETECTED   Staphylococcus aureus NOT DETECTED NOT DETECTED   Methicillin resistance DETECTED (A) NOT DETECTED   Streptococcus species NOT DETECTED NOT DETECTED   Streptococcus agalactiae NOT DETECTED NOT DETECTED   Streptococcus pneumoniae NOT DETECTED NOT DETECTED   Streptococcus pyogenes NOT DETECTED NOT DETECTED   Acinetobacter baumannii NOT DETECTED NOT DETECTED   Enterobacteriaceae species NOT DETECTED NOT DETECTED   Enterobacter cloacae complex NOT DETECTED NOT DETECTED   Escherichia coli NOT DETECTED NOT DETECTED   Klebsiella oxytoca NOT DETECTED NOT DETECTED   Klebsiella pneumoniae NOT DETECTED NOT DETECTED   Proteus species NOT DETECTED NOT DETECTED   Serratia marcescens NOT DETECTED NOT DETECTED   Haemophilus influenzae NOT DETECTED NOT DETECTED   Neisseria meningitidis NOT DETECTED NOT DETECTED   Pseudomonas aeruginosa NOT DETECTED NOT DETECTED   Candida albicans NOT DETECTED NOT DETECTED   Candida glabrata NOT DETECTED NOT DETECTED   Candida krusei NOT DETECTED NOT DETECTED   Candida parapsilosis NOT DETECTED NOT DETECTED   Candida tropicalis NOT DETECTED NOT DETECTED    Name of physician (or Provider) Contacted:Thompson  Changes to prescribed antibiotics required: no abxs currently, possible contaminant  Eudelia Bunch, Pharm.D. [phone] 10/10/2017 11:01 AM

## 2017-10-11 LAB — BPAM RBC
BLOOD PRODUCT EXPIRATION DATE: 201812022359
BLOOD PRODUCT EXPIRATION DATE: 201812062359
ISSUE DATE / TIME: 201811201004
ISSUE DATE / TIME: 201811201310
UNIT TYPE AND RH: 600
Unit Type and Rh: 600

## 2017-10-11 LAB — BASIC METABOLIC PANEL
ANION GAP: 6 (ref 5–15)
BUN: 26 mg/dL — AB (ref 6–20)
CO2: 21 mmol/L — AB (ref 22–32)
Calcium: 8 mg/dL — ABNORMAL LOW (ref 8.9–10.3)
Chloride: 115 mmol/L — ABNORMAL HIGH (ref 101–111)
Creatinine, Ser: 0.91 mg/dL (ref 0.44–1.00)
GFR calc Af Amer: >60 mL/min (ref >60)
GFR, EST NON AFRICAN AMERICAN: 54 mL/min — AB (ref >60)
GLUCOSE: 162 mg/dL — AB (ref 65–99)
POTASSIUM: 3.3 mmol/L — AB (ref 3.5–5.1)
Sodium: 142 mmol/L (ref 135–145)

## 2017-10-11 LAB — TYPE AND SCREEN
ABO/RH(D): A NEG
ANTIBODY SCREEN: NEGATIVE
UNIT DIVISION: 0
UNIT DIVISION: 0

## 2017-10-11 LAB — CBC WITH DIFFERENTIAL/PLATELET
BASOS ABS: 0 10*3/uL (ref 0.0–0.1)
Basophils Relative: 0 %
EOS PCT: 4 %
Eosinophils Absolute: 0.3 10*3/uL (ref 0.0–0.7)
HEMATOCRIT: 29.1 % — AB (ref 36.0–46.0)
Hemoglobin: 9.9 g/dL — ABNORMAL LOW (ref 12.0–15.0)
LYMPHS ABS: 1.8 10*3/uL (ref 0.7–4.0)
LYMPHS PCT: 19 %
MCH: 33.7 pg (ref 26.0–34.0)
MCHC: 34 g/dL (ref 30.0–36.0)
MCV: 99 fL (ref 78.0–100.0)
Monocytes Absolute: 1.1 10*3/uL — ABNORMAL HIGH (ref 0.1–1.0)
Monocytes Relative: 12 %
NEUTROS ABS: 6.1 10*3/uL (ref 1.7–7.7)
Neutrophils Relative %: 65 %
PLATELETS: 178 10*3/uL (ref 150–400)
RBC: 2.94 MIL/uL — AB (ref 3.87–5.11)
RDW: 19.2 % — ABNORMAL HIGH (ref 11.5–15.5)
WBC: 9.5 10*3/uL (ref 4.0–10.5)

## 2017-10-11 NOTE — Discharge Summary (Signed)
Physician Discharge Summary  [name] [name] HFW:[id number] DOB: 11/16/1924 DOA: 10/09/2017  PCP: Elson Clan, MD  Admit date: 10/09/2017 Discharge date: 10/11/2017  Time spent: 35 minutes  Recommendations for Outpatient Follow-up:  1. SNF with palliative care follow-up, comfort focused care and low threshold to initiate hospice services   Discharge Diagnoses:  Principal Problem:   Right tibial fracture   Advanced dementia   Dysphagia   PARKINSON'S DISEASE   Essential hypertension   Type II diabetes mellitus (HCC)   Gastroesophageal reflux disease with hiatal hernia   Frequent falls   Senile dementia   Benign essential tremor   CKD (chronic kidney disease) stage 3, GFR 30-59 ml/min (HCC)   Anemia, iron deficiency   Dehydration   Anemia   Aspiration pneumonitis (HCC)   Goals of care, counseling/discussion   Palliative care encounter   Discharge Condition: Poor  Diet recommendation: Comfort feeds/dysphagia 1 diet as tolerated  Filed Weights   10/09/17 0829 10/10/17 1227  Weight: 61.4 kg (135 lb 7 oz) 62.9 kg (138 lb 9.6 oz)    History of present illness:   [name] [name] is a 83 y.o. female with medical history significant of advanced dementia, she is nonverbal and nonambulatory, all information obtained from her family at the bedside. For last 2-1/2 years she is a resident at a skilled nursing facility, she is known ambulatory nonverbal due to her advanced dementia, she does have difficulty swallowing, she is on a liquid diet and requires assistance with feedings. For 48 hours prior to admission she was noted to have pain on her right lower extremity while being moved, 24 hours ago she was noticed to be congested and dyspneic.  workup at the skilled nursing facility included chest x-ray which showed left lower lobe infiltrate and right tibial fracture  Hospital Course:  #1 closed right tibial fracture -Due to advanced dementia, bedbound status, case was discussed  with orthopedics who recommended nonoperative management -Continue Tylenol, supportive care, she is bedbound  2. Suspected aspiration pneumonitis/ dysphagia -Admission x-ray negative for acute infiltrate -Known history of dysphagia along with dementia -Speech therapy following, continue dysphagia 1 diet with aspiration precautions -Not a candidate for feeding tube  3.  Dehydration -Adequately hydrated now fluids cut down  4.  Anemia -Due to iron deficiency and chronic disease -She was transfused 2 units of PRBCs this admission -No plan to workup her anemia due to overall very poor prognosis  5. Advanced dementia -Nonverbal and bedbound, total care at baseline -Palliative following, she also has dysphagia -Despite having advanced dementia for a number of years she is still able to maintain a decent oral intake and has not lost any weight, hence at this time palliative care recommended discharge to SNF with palliative care services with very low threshold to transition to full comfort care and hospice as she declines. Family has expressed interest in comfort focused care and no further hospitalization as possible.    Consultations:  Palliative medicine  Discharge Exam: Vitals:   10/10/17 2038 10/11/17 0647  BP: (!) 120/55 (!) 166/56  Pulse: 74 74  Resp: (!) 22 (!) 22  Temp: 99.5 F (37.5 C) 99.7 F (37.6 C)  SpO2: 96% 95%    General: Confused, nonverbal Cardiovascular: S1-S2 regular rate rhythm Respiratory: Diminished breath sounds at the bases  Discharge Instructions   Discharge Instructions    Discharge instructions   Complete by:  As directed    Comfort focused feeds   Increase activity slowly  Complete by:  As directed      Current Discharge Medication List    CONTINUE these medications which have NOT CHANGED   Details  acetaminophen (TYLENOL) 160 MG/5ML liquid Take 480 mg 3 (three) times daily by mouth.    ASPIRIN LOW DOSE 81 MG EC tablet TAKE 1  TABLET BY MOUTH ONCE DAILY. Qty: 30 tablet, Refills: 12    citalopram (CELEXA) 10 MG/5ML suspension Take 10 mg daily by mouth.    Guar Gum (NUTRISOURCE FIBER) POWD Take 1 Scoop every evening by mouth.    influenza vac split quadrivalent (FLUZONE QUADRIVALENT) injection Inject 0.5 mLs once into the muscle.    polyethylene glycol powder (GLYCOLAX/MIRALAX) powder MIX 1 CAPFUL (17G) IN 8 OUNCES OF JUICE/WATER AND DRINK ONCE DAILY AS NEEDED FOR MILD CONSTIPATION Qty: 527 g, Refills: 2    ranitidine (ZANTAC) 150 MG/10ML syrup Take 150 mg at bedtime by mouth.    feeding supplement, RESOURCE BREEZE, (RESOURCE BREEZE) LIQD Take 1 Container by mouth daily. Qty: 30 Container, Refills: 0      STOP taking these medications     cefTRIAXone (ROCEPHIN) 1 g injection      acetaminophen (Q-PAP) 500 MG tablet      citalopram (CELEXA) 20 MG tablet      donepezil (ARICEPT) 5 MG tablet      memantine (NAMENDA) 10 MG tablet      Multiple Vitamins-Minerals (MULTIVITAMIN) tablet      omeprazole (PRILOSEC) 20 MG capsule      primidone (MYSOLINE) 50 MG tablet      Wheat Dextrin (BENEFIBER DRINK MIX) PACK        Allergies  Allergen Reactions  . Band-Aid Liquid Bandage [Dermagran]     Use non-stick gauze with paper tape  . Codeine Other (See Comments)    REACTION: Unknown  . Dilaudid [Hydromorphone Hcl]     Caused agitation, hallucinations, could not sleep, pt made funny noises like laughing / crying.  . Morphine Nausea And Vomiting  . Other     NO CITRUS, TOMATO BASED PRODUCTS OR CHOCOLATE - BECAUSE OF REFLUX PT'S FAMILY STATES THAT ANY TIME PT IS GIVEN NARCOTICS SHE HALLUCINATES AND TRIES TO GET UP OUT W/C OR BED Medications have to be crushed and put in apple sauce.   Contact information for after-discharge care    Destination    [facility] .   Service:  Skilled Nursing Contact information: [address] [place] [phone]                The results of significant diagnostics from this hospitalization (including imaging, microbiology, ancillary and laboratory) are listed below for reference.    Significant Diagnostic Studies: Dg Knee 2 Views Right  Result Date: 10/09/2017 CLINICAL DATA:  Increased right knee pain. Abnormal appearance of the knee on a right tibia and fibula series of today's study. EXAM: RIGHT KNEE - 1-2 VIEW COMPARISON:  Right tibia and fibula radiograph of today's date and perioperative right knee series of February 15, 2007 FINDINGS: There is an acute fracture involving the anterior and medial aspects of the tibial metaphysis. Some involvement laterally is suspected but not well demonstrated. There is avulsion of the tibial tuberosity. The proximal fibula is grossly intact. The interface of the tibial portion of the knee prosthesis with the surrounding native bone appears normal. The distal femur and the femoral portion of the prosthesis also appear normal. IMPRESSION: There is an acute fracture of the  tibial metaphysis anteriorly, medially, and possibly laterally. The fracture line is at and just inferior to the prosthesis and its interface with the native bone. Electronically Signed   By: David  Martinique M.D.   On: 10/09/2017 11:01   Dg Tibia/fibula Right  Result Date: 10/09/2017 CLINICAL DATA:  Right lower leg pain and swelling. No trauma history known. EXAM: RIGHT TIBIA AND FIBULA - 2 VIEW COMPARISON:  Ankle films 06/22/2011.  Knee films 02/15/2015. FINDINGS: Vascular calcifications. Diffuse soft tissue swelling. Total knee arthroplasty. Osseous irregularity involving the medial proximal tibia. Suboptimally evaluated. The knee is likely flexed on the anterior posterior tibia film. IMPRESSION: Osseous irregularity involving the proximal tibia, suspicious for fracture. This is suboptimally evaluated secondary to patient positioning on nondedicated radiograph. Recommend further evaluation with dedicated knee  films. Electronically Signed   By: Abigail Miyamoto M.D.   On: 10/09/2017 09:31   Dg Chest Port 1 View  Result Date: 10/09/2017 CLINICAL DATA:  Altered mental status EXAM: PORTABLE CHEST 1 VIEW COMPARISON:  Mar 22, 2015 FINDINGS: There is no edema or consolidation. Heart size and pulmonary vascularity are normal. There is aortic atherosclerosis. No adenopathy. There is postoperative change in the lower cervical spine. Bones are osteoporotic. IMPRESSION: No edema or consolidation.  There is aortic atherosclerosis. Aortic Atherosclerosis (ICD10-I70.0). Electronically Signed   By: Lowella Grip III M.D.   On: 10/09/2017 09:24    Microbiology: Recent Results (from the past 240 hour(s))  Culture, blood (routine x 2)     Status: None (Preliminary result)   Collection Time: 10/09/17  8:56 AM  Result Value Ref Range Status   Specimen Description BLOOD RIGHT FOREARM  Final   Special Requests   Final    IN PEDIATRIC BOTTLE Blood Culture results may not be optimal due to an excessive volume of blood received in culture bottles   Culture   Final    NO GROWTH < 24 HOURS Performed at [facility], [place] [address] [place], [place] [address]    Report Status PENDING  Incomplete  Culture, blood (routine x 2)     Status: None (Preliminary result)   Collection Time: 10/09/17  9:01 AM  Result Value Ref Range Status   Specimen Description RIGHT ANTECUBITAL  Final   Special Requests IN PEDIATRIC BOTTLE Blood Culture adequate volume  Final   Culture  Setup Time   Final    GRAM POSITIVE COCCI IN CLUSTERS IN PEDIATRIC BOTTLE CRITICAL RESULT CALLED TO, READ BACK BY AND VERIFIED WITH: DWOFFORD @1012  10/10/17 BY LHOWARD Performed at [facility], [place] [address], [place], [place] [address]    Culture GRAM POSITIVE COCCI  Final   Report Status PENDING  Incomplete  Blood Culture ID Panel (Reflexed)     Status: Abnormal   Collection Time: 10/09/17  9:01 AM  Result Value Ref Range Status    Enterococcus species NOT DETECTED NOT DETECTED Final   Listeria monocytogenes NOT DETECTED NOT DETECTED Final   Staphylococcus species DETECTED (A) NOT DETECTED Final    Comment: Methicillin (oxacillin) resistant coagulase negative staphylococcus. Possible blood culture contaminant (unless isolated from more than one blood culture draw or clinical case suggests pathogenicity). No antibiotic treatment is indicated for blood  culture contaminants. CRITICAL RESULT CALLED TO, READ BACK BY AND VERIFIED WITH: DWOFFOLD,PHARMD @1012  10/10/17 BY LHOWARD    Staphylococcus aureus NOT DETECTED NOT DETECTED Final   Methicillin resistance DETECTED (A) NOT DETECTED Final    Comment: CRITICAL RESULT CALLED TO, READ  BACK BY AND VERIFIED WITH: DWOFFORD,PHARMD @1012  10/10/17 BY LHOWARD    Streptococcus species NOT DETECTED NOT DETECTED Final   Streptococcus agalactiae NOT DETECTED NOT DETECTED Final   Streptococcus pneumoniae NOT DETECTED NOT DETECTED Final   Streptococcus pyogenes NOT DETECTED NOT DETECTED Final   Acinetobacter baumannii NOT DETECTED NOT DETECTED Final   Enterobacteriaceae species NOT DETECTED NOT DETECTED Final   Enterobacter cloacae complex NOT DETECTED NOT DETECTED Final   Escherichia coli NOT DETECTED NOT DETECTED Final   Klebsiella oxytoca NOT DETECTED NOT DETECTED Final   Klebsiella pneumoniae NOT DETECTED NOT DETECTED Final   Proteus species NOT DETECTED NOT DETECTED Final   Serratia marcescens NOT DETECTED NOT DETECTED Final   Haemophilus influenzae NOT DETECTED NOT DETECTED Final   Neisseria meningitidis NOT DETECTED NOT DETECTED Final   Pseudomonas aeruginosa NOT DETECTED NOT DETECTED Final   Candida albicans NOT DETECTED NOT DETECTED Final   Candida glabrata NOT DETECTED NOT DETECTED Final   Candida krusei NOT DETECTED NOT DETECTED Final   Candida parapsilosis NOT DETECTED NOT DETECTED Final   Candida tropicalis NOT DETECTED NOT DETECTED Final    Comment: Performed at [facility], [place] [address] [place], [place] [address]  Culture, Urine     Status: Abnormal (Preliminary result)   Collection Time: 10/10/17  2:05 PM  Result Value Ref Range Status   Specimen Description URINE, CLEAN CATCH  Final   Special Requests NONE  Final   Culture 60,000 COLONIES/mL ENTEROCOCCUS FAECALIS (A)  Final   Report Status PENDING  Incomplete     Labs: Basic Metabolic Panel: Recent Labs  Lab 10/09/17 0856 10/10/17 0623 10/11/17 0615  NA 144 146* 142  K 3.6 3.5 3.3*  CL 113* 117* 115*  CO2 23 23 21*  GLUCOSE 175* 163* 162*  BUN 42* 31* 26*  CREATININE 1.17* 0.88 0.91  CALCIUM 9.0 8.2* 8.0*   Liver Function Tests: Recent Labs  Lab 10/09/17 0856 10/10/17 0623  AST 26 21  ALT 17 15  ALKPHOS 49 40  BILITOT 0.8 0.6  PROT 6.6 6.0*  ALBUMIN 3.7 2.9*   No results for input(s): LIPASE, AMYLASE in the last 168 hours. No results for input(s): AMMONIA in the last 168 hours. CBC: Recent Labs  Lab 10/09/17 0856 10/10/17 0623 10/10/17 1739 10/11/17 0615  WBC 15.4* 8.3 8.0 9.5  NEUTROABS 12.2*  --  5.0 6.1  HGB 7.3* 6.6* 9.9* 9.9*  HCT 21.5* 20.6* 29.9* 29.1*  MCV 104.4* 104.6* 99.3 99.0  PLT 168 156 161 178   Cardiac Enzymes: Recent Labs  Lab 10/09/17 0856 10/10/17 0854 10/10/17 1739 10/10/17 2308  TROPONINI 0.10* 0.09* 0.08* 0.07*   BNP: BNP (last 3 results) No results for input(s): BNP in the last 8760 hours.  ProBNP (last 3 results) No results for input(s): PROBNP in the last 8760 hours.  CBG: No results for input(s): GLUCAP in the last 168 hours.     Signed:  Domenic Polite MD.  [facility] 10/11/2017, 1:16 PM

## 2017-10-11 NOTE — Progress Notes (Signed)
Report called to [facility]. Pts IV removed with a clean and dry dressing intact. Pts knee immobilizer remained on during transport. Pt transported by PT[facility] with family present as well.

## 2017-10-11 NOTE — Progress Notes (Signed)
Patient returning to [facility]. Facility aware of patient's discharge and confirmed patient's ability to return. PT[facility] contacted, patient's family notified. Patient's RN can call report to [phone], packet complete. CSW signing off, no other needs identified at this time.  [name] [name], [place] Social Worker [facility] Cell#: [phone]

## 2017-10-11 NOTE — Progress Notes (Signed)
PROGRESS NOTE    [name] [name]  ZSW:[id number] DOB: 1924/03/27 DOA: 10/09/2017 PCP: Elson Clan, MD    Brief Narrative: This is a 83 year old female with advanced dementia, bedbound nonverbal at baseline, total care was admitted with leg pain and chest congestion. She was found to have right tibial fracture was felt to be a nonoperative candidate and additionally noted to have aspiration pneumonia. Palliative care consulted and following  Assessment & Plan:   #1 closed right tibial fracture -Due to advanced dementia, bedbound status, case was discussed with orthopedics who recommended nonoperative management -Continue Tylenol, supportive care  2. Suspected aspiration pneumonitis/ dysphagia -Admission x-ray negative for acute infiltrate -Known history of dysphagia along with dementia -Speech therapy following, continue dysphagia 1 diet with aspiration precautions -Not a candidate for feeding tube  3.  Dehydration -Hydrated, will cut down fluids now  4.  Anemia -Due to iron deficiency and chronic disease -She was transfused 2 units of PRBCs this admission -No plan to workup her anemia due to overall very poor prognosis  5. Advanced dementia -Nonverbal and bedbound, total care at baseline -Palliative following, she also has pretty significant dysphagia -I think she would be appropriate for hospice services if appropriate per palliative recommendations  DVT prophylaxis: SCDs Code Status: DNR Family Communication: Daughter at bedside Disposition Plan: SNF with hospice if possible  Consultants:   Palliative medicine  Procedures:   Plain films of the right tib-fib 10/09/2017  Chest x-ray 10/09/2017  Plain films of the right knee 10/09/2017  2 units packed red blood cells 10/10/2017  Antimicrobials:   None   Subjective: -No issues overnight, tolerating liquids diet  Objective: Vitals:   10/10/17 1332 10/10/17 1617 10/10/17 2038 10/11/17 0647  BP: 132/64  140/83 (!) 120/55 (!) 166/56  Pulse: 87 86 74 74  Resp: 20 20 (!) 22 (!) 22  Temp: 99.7 F (37.6 C) 100.2 F (37.9 C) 99.5 F (37.5 C) 99.7 F (37.6 C)  TempSrc: Axillary Axillary Axillary Axillary  SpO2: 98% 97% 96% 95%  Weight:      Height:        Intake/Output Summary (Last 24 hours) at 10/11/2017 1220 Last data filed at 10/11/2017 0954 Gross per 24 hour  Intake 1061.25 ml  Output 850 ml  Net 211.25 ml   Filed Weights   10/09/17 0829 10/10/17 1227  Weight: 61.4 kg (135 lb 7 oz) 62.9 kg (138 lb 9.6 oz)    Examination:  Gen: Alert confused, nonverbal HEENT: PERRLA Lungs: A few scattered rhonchi, rest clear anteriorly CVS: S1-S2 regular rate rhythm Abd: soft, Non tender, non distended, BS present Extremities: No Cyanosis, Clubbing or edema Skin: no new rashes   Data Reviewed: I have personally reviewed following labs and imaging studies  CBC: Recent Labs  Lab 10/09/17 0856 10/10/17 0623 10/10/17 1739 10/11/17 0615  WBC 15.4* 8.3 8.0 9.5  NEUTROABS 12.2*  --  5.0 6.1  HGB 7.3* 6.6* 9.9* 9.9*  HCT 21.5* 20.6* 29.9* 29.1*  MCV 104.4* 104.6* 99.3 99.0  PLT 168 156 161 322   Basic Metabolic Panel: Recent Labs  Lab 10/09/17 0856 10/10/17 0623 10/11/17 0615  NA 144 146* 142  K 3.6 3.5 3.3*  CL 113* 117* 115*  CO2 23 23 21*  GLUCOSE 175* 163* 162*  BUN 42* 31* 26*  CREATININE 1.17* 0.88 0.91  CALCIUM 9.0 8.2* 8.0*   GFR: Estimated Creatinine Clearance: 34.5 mL/min (by C-G formula based on SCr of 0.91 mg/dL). Liver Function Tests: Recent  Labs  Lab 10/09/17 0856 10/10/17 0623  AST 26 21  ALT 17 15  ALKPHOS 49 40  BILITOT 0.8 0.6  PROT 6.6 6.0*  ALBUMIN 3.7 2.9*   No results for input(s): LIPASE, AMYLASE in the last 168 hours. No results for input(s): AMMONIA in the last 168 hours. Coagulation Profile: Recent Labs  Lab 10/09/17 0856  INR 1.10   Cardiac Enzymes: Recent Labs  Lab 10/09/17 0856 10/10/17 0854 10/10/17 1739 10/10/17 2308   TROPONINI 0.10* 0.09* 0.08* 0.07*   BNP (last 3 results) No results for input(s): PROBNP in the last 8760 hours. HbA1C: No results for input(s): HGBA1C in the last 72 hours. CBG: No results for input(s): GLUCAP in the last 168 hours. Lipid Profile: No results for input(s): CHOL, HDL, LDLCALC, TRIG, CHOLHDL, LDLDIRECT in the last 72 hours. Thyroid Function Tests: No results for input(s): TSH, T4TOTAL, FREET4, T3FREE, THYROIDAB in the last 72 hours. Anemia Panel: Recent Labs    10/10/17 0623  FERRITIN 185  TIBC 249*  IRON 28   Sepsis Labs: Recent Labs  Lab 10/09/17 0945  LATICACIDVEN 1.22    Recent Results (from the past 240 hour(s))  Culture, blood (routine x 2)     Status: None (Preliminary result)   Collection Time: 10/09/17  8:56 AM  Result Value Ref Range Status   Specimen Description BLOOD RIGHT FOREARM  Final   Special Requests   Final    IN PEDIATRIC BOTTLE Blood Culture results may not be optimal due to an excessive volume of blood received in culture bottles   Culture   Final    NO GROWTH < 24 HOURS Performed at [facility], [place] [address], [place], [place] [address]    Report Status PENDING  Incomplete  Culture, blood (routine x 2)     Status: None (Preliminary result)   Collection Time: 10/09/17  9:01 AM  Result Value Ref Range Status   Specimen Description RIGHT ANTECUBITAL  Final   Special Requests IN PEDIATRIC BOTTLE Blood Culture adequate volume  Final   Culture  Setup Time   Final    GRAM POSITIVE COCCI IN CLUSTERS IN PEDIATRIC BOTTLE CRITICAL RESULT CALLED TO, READ BACK BY AND VERIFIED WITH: DWOFFORD @1012  10/10/17 BY LHOWARD Performed at [facility], [place] [address] [place], [place] [address]    Culture GRAM POSITIVE COCCI  Final   Report Status PENDING  Incomplete  Blood Culture ID Panel (Reflexed)     Status: Abnormal   Collection Time: 10/09/17  9:01 AM  Result Value Ref Range Status   Enterococcus species NOT DETECTED NOT  DETECTED Final   Listeria monocytogenes NOT DETECTED NOT DETECTED Final   Staphylococcus species DETECTED (A) NOT DETECTED Final    Comment: Methicillin (oxacillin) resistant coagulase negative staphylococcus. Possible blood culture contaminant (unless isolated from more than one blood culture draw or clinical case suggests pathogenicity). No antibiotic treatment is indicated for blood  culture contaminants. CRITICAL RESULT CALLED TO, READ BACK BY AND VERIFIED WITH: DWOFFOLD,PHARMD @1012  10/10/17 BY LHOWARD    Staphylococcus aureus NOT DETECTED NOT DETECTED Final   Methicillin resistance DETECTED (A) NOT DETECTED Final    Comment: CRITICAL RESULT CALLED TO, READ BACK BY AND VERIFIED WITH: DWOFFORD,PHARMD @1012  10/10/17 BY LHOWARD    Streptococcus species NOT DETECTED NOT DETECTED Final   Streptococcus agalactiae NOT DETECTED NOT DETECTED Final   Streptococcus pneumoniae NOT DETECTED NOT DETECTED Final   Streptococcus pyogenes NOT DETECTED NOT DETECTED Final  Acinetobacter baumannii NOT DETECTED NOT DETECTED Final   Enterobacteriaceae species NOT DETECTED NOT DETECTED Final   Enterobacter cloacae complex NOT DETECTED NOT DETECTED Final   Escherichia coli NOT DETECTED NOT DETECTED Final   Klebsiella oxytoca NOT DETECTED NOT DETECTED Final   Klebsiella pneumoniae NOT DETECTED NOT DETECTED Final   Proteus species NOT DETECTED NOT DETECTED Final   Serratia marcescens NOT DETECTED NOT DETECTED Final   Haemophilus influenzae NOT DETECTED NOT DETECTED Final   Neisseria meningitidis NOT DETECTED NOT DETECTED Final   Pseudomonas aeruginosa NOT DETECTED NOT DETECTED Final   Candida albicans NOT DETECTED NOT DETECTED Final   Candida glabrata NOT DETECTED NOT DETECTED Final   Candida krusei NOT DETECTED NOT DETECTED Final   Candida parapsilosis NOT DETECTED NOT DETECTED Final   Candida tropicalis NOT DETECTED NOT DETECTED Final    Comment: Performed at [facility], [place] [address],  [place], [place] [address]  Culture, Urine     Status: Abnormal (Preliminary result)   Collection Time: 10/10/17  2:05 PM  Result Value Ref Range Status   Specimen Description URINE, CLEAN CATCH  Final   Special Requests NONE  Final   Culture (A)  Final    60,000 COLONIES/mL UNIDENTIFIED ORGANISM Performed at [facility], [place] [address] [place], [place] [address]    Report Status PENDING  Incomplete         Radiology Studies: No results found.      Scheduled Meds: . acetaminophen  650 mg Oral TID  . citalopram  10 mg Oral Daily  . feeding supplement  1 Container Oral BID BM  . multivitamin  15 mL Oral Daily  . ranitidine  150 mg Oral QHS  . sodium chloride flush  3 mL Intravenous Q12H   Continuous Infusions: . dextrose 5 % and 0.45% NaCl 75 mL/hr at 10/10/17 0819     LOS: 2 days    Time spent: 35 minutes    Domenic Polite, MD [facility] Page via [email or web address] password [email or web address]  If 7PM-7AM, please contact night-coverage [email or web address] Password [email or web address] 10/11/2017, 12:20 PM

## 2017-10-11 NOTE — Progress Notes (Signed)
   10/11/17 1508  Clinical Encounter Type  Visited With Patient and family together  Visit Type Initial  Spiritual Encounters  Spiritual Needs Prayer  Stress Factors  Family Stress Factors Health changes   Rounding on patients on the palliative list.  Patient was probably not aware of my presence, but grand daughter was by her side.  Family shared about her life and her family.  Prayed with family and patient.  Will follow as needed.  Chaplain Katherene Ponto

## 2017-10-12 LAB — CULTURE, BLOOD (ROUTINE X 2): SPECIAL REQUESTS: ADEQUATE

## 2017-10-12 LAB — URINE CULTURE: Culture: 60000 — AB

## 2017-10-14 LAB — CULTURE, BLOOD (ROUTINE X 2): Culture: NO GROWTH

## 2017-10-18 DIAGNOSIS — I5042 Chronic combined systolic (congestive) and diastolic (congestive) heart failure: Secondary | ICD-10-CM | POA: Diagnosis not present

## 2017-10-18 DIAGNOSIS — Z95810 Presence of automatic (implantable) cardiac defibrillator: Secondary | ICD-10-CM | POA: Diagnosis not present

## 2017-10-18 DIAGNOSIS — I130 Hypertensive heart and chronic kidney disease with heart failure and stage 1 through stage 4 chronic kidney disease, or unspecified chronic kidney disease: Secondary | ICD-10-CM | POA: Diagnosis not present

## 2017-10-18 DIAGNOSIS — I2510 Atherosclerotic heart disease of native coronary artery without angina pectoris: Secondary | ICD-10-CM | POA: Diagnosis not present

## 2017-10-19 DIAGNOSIS — S82001A Unspecified fracture of right patella, initial encounter for closed fracture: Secondary | ICD-10-CM | POA: Diagnosis not present

## 2017-10-19 DIAGNOSIS — D649 Anemia, unspecified: Secondary | ICD-10-CM | POA: Diagnosis not present

## 2017-10-19 DIAGNOSIS — Z66 Do not resuscitate: Secondary | ICD-10-CM | POA: Diagnosis not present

## 2017-10-19 DIAGNOSIS — R1312 Dysphagia, oropharyngeal phase: Secondary | ICD-10-CM | POA: Diagnosis not present

## 2017-10-19 DIAGNOSIS — K5790 Diverticulosis of intestine, part unspecified, without perforation or abscess without bleeding: Secondary | ICD-10-CM | POA: Diagnosis not present

## 2017-10-19 DIAGNOSIS — G20 Parkinson's disease: Secondary | ICD-10-CM | POA: Diagnosis not present

## 2017-10-19 DIAGNOSIS — E119 Type 2 diabetes mellitus without complications: Secondary | ICD-10-CM | POA: Diagnosis not present

## 2017-10-19 DIAGNOSIS — G309 Alzheimer's disease, unspecified: Secondary | ICD-10-CM | POA: Diagnosis not present

## 2017-10-19 DIAGNOSIS — D519 Vitamin B12 deficiency anemia, unspecified: Secondary | ICD-10-CM | POA: Diagnosis not present

## 2017-10-19 DIAGNOSIS — F329 Major depressive disorder, single episode, unspecified: Secondary | ICD-10-CM | POA: Diagnosis not present

## 2017-10-19 DIAGNOSIS — N189 Chronic kidney disease, unspecified: Secondary | ICD-10-CM | POA: Diagnosis not present

## 2017-10-20 DIAGNOSIS — R1312 Dysphagia, oropharyngeal phase: Secondary | ICD-10-CM | POA: Diagnosis not present

## 2017-10-20 DIAGNOSIS — G20 Parkinson's disease: Secondary | ICD-10-CM | POA: Diagnosis not present

## 2017-10-20 DIAGNOSIS — G309 Alzheimer's disease, unspecified: Secondary | ICD-10-CM | POA: Diagnosis not present

## 2017-10-20 DIAGNOSIS — S82001A Unspecified fracture of right patella, initial encounter for closed fracture: Secondary | ICD-10-CM | POA: Diagnosis not present

## 2017-10-20 DIAGNOSIS — E119 Type 2 diabetes mellitus without complications: Secondary | ICD-10-CM | POA: Diagnosis not present

## 2017-10-20 DIAGNOSIS — N189 Chronic kidney disease, unspecified: Secondary | ICD-10-CM | POA: Diagnosis not present

## 2017-10-21 DIAGNOSIS — Z66 Do not resuscitate: Secondary | ICD-10-CM | POA: Diagnosis not present

## 2017-10-21 DIAGNOSIS — R1312 Dysphagia, oropharyngeal phase: Secondary | ICD-10-CM | POA: Diagnosis not present

## 2017-10-21 DIAGNOSIS — N189 Chronic kidney disease, unspecified: Secondary | ICD-10-CM | POA: Diagnosis not present

## 2017-10-21 DIAGNOSIS — E119 Type 2 diabetes mellitus without complications: Secondary | ICD-10-CM | POA: Diagnosis not present

## 2017-10-21 DIAGNOSIS — G20 Parkinson's disease: Secondary | ICD-10-CM | POA: Diagnosis not present

## 2017-10-21 DIAGNOSIS — S82001A Unspecified fracture of right patella, initial encounter for closed fracture: Secondary | ICD-10-CM | POA: Diagnosis not present

## 2017-10-21 DIAGNOSIS — F329 Major depressive disorder, single episode, unspecified: Secondary | ICD-10-CM | POA: Diagnosis not present

## 2017-10-21 DIAGNOSIS — K5790 Diverticulosis of intestine, part unspecified, without perforation or abscess without bleeding: Secondary | ICD-10-CM | POA: Diagnosis not present

## 2017-10-21 DIAGNOSIS — G309 Alzheimer's disease, unspecified: Secondary | ICD-10-CM | POA: Diagnosis not present

## 2017-10-23 DIAGNOSIS — G309 Alzheimer's disease, unspecified: Secondary | ICD-10-CM | POA: Diagnosis not present

## 2017-10-23 DIAGNOSIS — E119 Type 2 diabetes mellitus without complications: Secondary | ICD-10-CM | POA: Diagnosis not present

## 2017-10-23 DIAGNOSIS — S82001A Unspecified fracture of right patella, initial encounter for closed fracture: Secondary | ICD-10-CM | POA: Diagnosis not present

## 2017-10-23 DIAGNOSIS — N189 Chronic kidney disease, unspecified: Secondary | ICD-10-CM | POA: Diagnosis not present

## 2017-10-23 DIAGNOSIS — R1312 Dysphagia, oropharyngeal phase: Secondary | ICD-10-CM | POA: Diagnosis not present

## 2017-10-23 DIAGNOSIS — G20 Parkinson's disease: Secondary | ICD-10-CM | POA: Diagnosis not present

## 2017-10-26 DIAGNOSIS — G20 Parkinson's disease: Secondary | ICD-10-CM | POA: Diagnosis not present

## 2017-10-26 DIAGNOSIS — G309 Alzheimer's disease, unspecified: Secondary | ICD-10-CM | POA: Diagnosis not present

## 2017-10-26 DIAGNOSIS — S82001A Unspecified fracture of right patella, initial encounter for closed fracture: Secondary | ICD-10-CM | POA: Diagnosis not present

## 2017-10-26 DIAGNOSIS — E119 Type 2 diabetes mellitus without complications: Secondary | ICD-10-CM | POA: Diagnosis not present

## 2017-10-26 DIAGNOSIS — N189 Chronic kidney disease, unspecified: Secondary | ICD-10-CM | POA: Diagnosis not present

## 2017-10-26 DIAGNOSIS — R1312 Dysphagia, oropharyngeal phase: Secondary | ICD-10-CM | POA: Diagnosis not present

## 2017-11-02 DIAGNOSIS — G20 Parkinson's disease: Secondary | ICD-10-CM | POA: Diagnosis not present

## 2017-11-02 DIAGNOSIS — R1312 Dysphagia, oropharyngeal phase: Secondary | ICD-10-CM | POA: Diagnosis not present

## 2017-11-02 DIAGNOSIS — N189 Chronic kidney disease, unspecified: Secondary | ICD-10-CM | POA: Diagnosis not present

## 2017-11-02 DIAGNOSIS — S82001A Unspecified fracture of right patella, initial encounter for closed fracture: Secondary | ICD-10-CM | POA: Diagnosis not present

## 2017-11-02 DIAGNOSIS — G309 Alzheimer's disease, unspecified: Secondary | ICD-10-CM | POA: Diagnosis not present

## 2017-11-02 DIAGNOSIS — E119 Type 2 diabetes mellitus without complications: Secondary | ICD-10-CM | POA: Diagnosis not present

## 2017-11-03 DIAGNOSIS — G309 Alzheimer's disease, unspecified: Secondary | ICD-10-CM | POA: Diagnosis not present

## 2017-11-03 DIAGNOSIS — E119 Type 2 diabetes mellitus without complications: Secondary | ICD-10-CM | POA: Diagnosis not present

## 2017-11-03 DIAGNOSIS — G20 Parkinson's disease: Secondary | ICD-10-CM | POA: Diagnosis not present

## 2017-11-03 DIAGNOSIS — N189 Chronic kidney disease, unspecified: Secondary | ICD-10-CM | POA: Diagnosis not present

## 2017-11-03 DIAGNOSIS — R1312 Dysphagia, oropharyngeal phase: Secondary | ICD-10-CM | POA: Diagnosis not present

## 2017-11-03 DIAGNOSIS — S82001A Unspecified fracture of right patella, initial encounter for closed fracture: Secondary | ICD-10-CM | POA: Diagnosis not present

## 2017-11-07 DIAGNOSIS — S82001A Unspecified fracture of right patella, initial encounter for closed fracture: Secondary | ICD-10-CM | POA: Diagnosis not present

## 2017-11-07 DIAGNOSIS — G20 Parkinson's disease: Secondary | ICD-10-CM | POA: Diagnosis not present

## 2017-11-07 DIAGNOSIS — R1312 Dysphagia, oropharyngeal phase: Secondary | ICD-10-CM | POA: Diagnosis not present

## 2017-11-07 DIAGNOSIS — N189 Chronic kidney disease, unspecified: Secondary | ICD-10-CM | POA: Diagnosis not present

## 2017-11-07 DIAGNOSIS — G309 Alzheimer's disease, unspecified: Secondary | ICD-10-CM | POA: Diagnosis not present

## 2017-11-07 DIAGNOSIS — E119 Type 2 diabetes mellitus without complications: Secondary | ICD-10-CM | POA: Diagnosis not present

## 2017-11-09 DIAGNOSIS — G309 Alzheimer's disease, unspecified: Secondary | ICD-10-CM | POA: Diagnosis not present

## 2017-11-09 DIAGNOSIS — E119 Type 2 diabetes mellitus without complications: Secondary | ICD-10-CM | POA: Diagnosis not present

## 2017-11-09 DIAGNOSIS — S82001A Unspecified fracture of right patella, initial encounter for closed fracture: Secondary | ICD-10-CM | POA: Diagnosis not present

## 2017-11-09 DIAGNOSIS — G20 Parkinson's disease: Secondary | ICD-10-CM | POA: Diagnosis not present

## 2017-11-09 DIAGNOSIS — N189 Chronic kidney disease, unspecified: Secondary | ICD-10-CM | POA: Diagnosis not present

## 2017-11-09 DIAGNOSIS — R1312 Dysphagia, oropharyngeal phase: Secondary | ICD-10-CM | POA: Diagnosis not present

## 2017-11-13 DIAGNOSIS — E119 Type 2 diabetes mellitus without complications: Secondary | ICD-10-CM | POA: Diagnosis not present

## 2017-11-13 DIAGNOSIS — G309 Alzheimer's disease, unspecified: Secondary | ICD-10-CM | POA: Diagnosis not present

## 2017-11-13 DIAGNOSIS — S82001A Unspecified fracture of right patella, initial encounter for closed fracture: Secondary | ICD-10-CM | POA: Diagnosis not present

## 2017-11-13 DIAGNOSIS — R1312 Dysphagia, oropharyngeal phase: Secondary | ICD-10-CM | POA: Diagnosis not present

## 2017-11-13 DIAGNOSIS — G20 Parkinson's disease: Secondary | ICD-10-CM | POA: Diagnosis not present

## 2017-11-13 DIAGNOSIS — N189 Chronic kidney disease, unspecified: Secondary | ICD-10-CM | POA: Diagnosis not present

## 2017-11-17 DIAGNOSIS — E119 Type 2 diabetes mellitus without complications: Secondary | ICD-10-CM | POA: Diagnosis not present

## 2017-11-17 DIAGNOSIS — S82001A Unspecified fracture of right patella, initial encounter for closed fracture: Secondary | ICD-10-CM | POA: Diagnosis not present

## 2017-11-17 DIAGNOSIS — G309 Alzheimer's disease, unspecified: Secondary | ICD-10-CM | POA: Diagnosis not present

## 2017-11-17 DIAGNOSIS — R1312 Dysphagia, oropharyngeal phase: Secondary | ICD-10-CM | POA: Diagnosis not present

## 2017-11-17 DIAGNOSIS — N189 Chronic kidney disease, unspecified: Secondary | ICD-10-CM | POA: Diagnosis not present

## 2017-11-17 DIAGNOSIS — G20 Parkinson's disease: Secondary | ICD-10-CM | POA: Diagnosis not present

## 2017-11-20 DIAGNOSIS — R1312 Dysphagia, oropharyngeal phase: Secondary | ICD-10-CM | POA: Diagnosis not present

## 2017-11-20 DIAGNOSIS — G20 Parkinson's disease: Secondary | ICD-10-CM | POA: Diagnosis not present

## 2017-11-20 DIAGNOSIS — G309 Alzheimer's disease, unspecified: Secondary | ICD-10-CM | POA: Diagnosis not present

## 2017-11-20 DIAGNOSIS — S82001A Unspecified fracture of right patella, initial encounter for closed fracture: Secondary | ICD-10-CM | POA: Diagnosis not present

## 2017-11-20 DIAGNOSIS — N189 Chronic kidney disease, unspecified: Secondary | ICD-10-CM | POA: Diagnosis not present

## 2017-11-20 DIAGNOSIS — E119 Type 2 diabetes mellitus without complications: Secondary | ICD-10-CM | POA: Diagnosis not present

## 2019-01-20 DEATH — deceased

## 2022-10-02 ENCOUNTER — Encounter (INDEPENDENT_AMBULATORY_CARE_PROVIDER_SITE_OTHER): Payer: Self-pay | Admitting: Gastroenterology
